# Patient Record
Sex: Female | Born: 1935 | Race: Black or African American | Hispanic: No
Health system: Southern US, Community
[De-identification: ages and names within clinical notes are randomized; demographics above are authoritative.]

## PROBLEM LIST (undated history)

## (undated) DIAGNOSIS — I669 Occlusion and stenosis of unspecified cerebral artery: Secondary | ICD-10-CM | POA: Insufficient documentation

## (undated) DIAGNOSIS — I10 Essential (primary) hypertension: Secondary | ICD-10-CM

## (undated) DIAGNOSIS — I639 Cerebral infarction, unspecified: Secondary | ICD-10-CM

## (undated) DIAGNOSIS — E785 Hyperlipidemia, unspecified: Secondary | ICD-10-CM | POA: Insufficient documentation

## (undated) DIAGNOSIS — N289 Disorder of kidney and ureter, unspecified: Secondary | ICD-10-CM

---

## 2007-11-16 HISTORY — PX: CARDIAC CATHETERIZATION: SHX172

## 2015-05-27 DIAGNOSIS — H1013 Acute atopic conjunctivitis, bilateral: Secondary | ICD-10-CM | POA: Insufficient documentation

## 2020-07-08 ENCOUNTER — Inpatient Hospital Stay (HOSPITAL_COMMUNITY)
Admission: EM | Admit: 2020-07-08 | Discharge: 2020-07-15 | DRG: 291 | Disposition: A | Discharge status: Home Health | Payer: Medicare Other | Attending: Internal Medicine | Admitting: Internal Medicine

## 2020-07-08 ENCOUNTER — Emergency Department (HOSPITAL_COMMUNITY): Payer: Medicare Other

## 2020-07-08 ENCOUNTER — Emergency Department (HOSPITAL_COMMUNITY)
Admission: EM | Admit: 2020-07-08 | Discharge: 2020-07-08 | Disposition: A | Discharge status: Home / Self Care | Payer: Medicare Other | Attending: Emergency Medicine | Admitting: Emergency Medicine

## 2020-07-08 DIAGNOSIS — Z8673 Personal history of transient ischemic attack (TIA), and cerebral infarction without residual deficits: Secondary | ICD-10-CM

## 2020-07-08 DIAGNOSIS — I509 Heart failure, unspecified: Secondary | ICD-10-CM

## 2020-07-08 DIAGNOSIS — I130 Hypertensive heart and chronic kidney disease with heart failure and stage 1 through stage 4 chronic kidney disease, or unspecified chronic kidney disease: Secondary | ICD-10-CM | POA: Diagnosis not present

## 2020-07-08 DIAGNOSIS — R079 Chest pain, unspecified: Secondary | ICD-10-CM | POA: Diagnosis present

## 2020-07-08 DIAGNOSIS — Z79899 Other long term (current) drug therapy: Secondary | ICD-10-CM

## 2020-07-08 DIAGNOSIS — R531 Weakness: Secondary | ICD-10-CM | POA: Diagnosis present

## 2020-07-08 DIAGNOSIS — E876 Hypokalemia: Secondary | ICD-10-CM | POA: Diagnosis present

## 2020-07-08 DIAGNOSIS — Z20822 Contact with and (suspected) exposure to covid-19: Secondary | ICD-10-CM | POA: Diagnosis present

## 2020-07-08 DIAGNOSIS — I252 Old myocardial infarction: Secondary | ICD-10-CM

## 2020-07-08 DIAGNOSIS — I160 Hypertensive urgency: Secondary | ICD-10-CM

## 2020-07-08 DIAGNOSIS — I5033 Acute on chronic diastolic (congestive) heart failure: Secondary | ICD-10-CM | POA: Diagnosis present

## 2020-07-08 DIAGNOSIS — J9601 Acute respiratory failure with hypoxia: Secondary | ICD-10-CM | POA: Diagnosis present

## 2020-07-08 DIAGNOSIS — E785 Hyperlipidemia, unspecified: Secondary | ICD-10-CM | POA: Diagnosis present

## 2020-07-08 DIAGNOSIS — Z5329 Procedure and treatment not carried out because of patient's decision for other reasons: Secondary | ICD-10-CM | POA: Diagnosis not present

## 2020-07-08 DIAGNOSIS — R471 Dysarthria and anarthria: Secondary | ICD-10-CM | POA: Diagnosis not present

## 2020-07-08 DIAGNOSIS — Z6831 Body mass index (BMI) 31.0-31.9, adult: Secondary | ICD-10-CM

## 2020-07-08 DIAGNOSIS — N184 Chronic kidney disease, stage 4 (severe): Secondary | ICD-10-CM | POA: Diagnosis present

## 2020-07-08 DIAGNOSIS — R29704 NIHSS score 4: Secondary | ICD-10-CM | POA: Diagnosis not present

## 2020-07-08 DIAGNOSIS — I161 Hypertensive emergency: Secondary | ICD-10-CM | POA: Diagnosis present

## 2020-07-08 DIAGNOSIS — I248 Other forms of acute ischemic heart disease: Secondary | ICD-10-CM | POA: Diagnosis present

## 2020-07-08 DIAGNOSIS — D72829 Elevated white blood cell count, unspecified: Secondary | ICD-10-CM | POA: Diagnosis present

## 2020-07-08 DIAGNOSIS — I63411 Cerebral infarction due to embolism of right middle cerebral artery: Secondary | ICD-10-CM | POA: Diagnosis not present

## 2020-07-08 DIAGNOSIS — I639 Cerebral infarction, unspecified: Secondary | ICD-10-CM | POA: Diagnosis not present

## 2020-07-08 DIAGNOSIS — Z8249 Family history of ischemic heart disease and other diseases of the circulatory system: Secondary | ICD-10-CM

## 2020-07-08 DIAGNOSIS — Z7902 Long term (current) use of antithrombotics/antiplatelets: Secondary | ICD-10-CM

## 2020-07-08 DIAGNOSIS — E669 Obesity, unspecified: Secondary | ICD-10-CM | POA: Diagnosis present

## 2020-07-08 DIAGNOSIS — I472 Ventricular tachycardia: Secondary | ICD-10-CM | POA: Diagnosis not present

## 2020-07-08 HISTORY — DX: Cerebral infarction, unspecified: I63.9

## 2020-07-08 HISTORY — DX: Essential (primary) hypertension: I10

## 2020-07-08 HISTORY — DX: Disorder of kidney and ureter, unspecified: N28.9

## 2020-07-09 ENCOUNTER — Other Ambulatory Visit: Payer: Self-pay

## 2020-07-09 ENCOUNTER — Emergency Department (HOSPITAL_COMMUNITY): Payer: Medicare Other

## 2020-07-09 ENCOUNTER — Inpatient Hospital Stay (HOSPITAL_COMMUNITY): Payer: Medicare Other

## 2020-07-09 ENCOUNTER — Ambulatory Visit (HOSPITAL_COMMUNITY)
Admission: RE | Admit: 2020-07-09 | Discharge: 2020-07-09 | Disposition: A | Discharge status: Home / Self Care | Payer: Medicare Other | Source: Ambulatory Visit | Attending: Emergency Medicine | Admitting: Emergency Medicine

## 2020-07-09 ENCOUNTER — Encounter (HOSPITAL_COMMUNITY): Payer: Self-pay | Admitting: Internal Medicine

## 2020-07-09 ENCOUNTER — Other Ambulatory Visit (HOSPITAL_COMMUNITY): Payer: Self-pay

## 2020-07-09 DIAGNOSIS — I509 Heart failure, unspecified: Secondary | ICD-10-CM

## 2020-07-09 DIAGNOSIS — E669 Obesity, unspecified: Secondary | ICD-10-CM | POA: Diagnosis present

## 2020-07-09 DIAGNOSIS — I63411 Cerebral infarction due to embolism of right middle cerebral artery: Secondary | ICD-10-CM | POA: Diagnosis not present

## 2020-07-09 DIAGNOSIS — R071 Chest pain on breathing: Secondary | ICD-10-CM | POA: Diagnosis not present

## 2020-07-09 DIAGNOSIS — I161 Hypertensive emergency: Secondary | ICD-10-CM | POA: Diagnosis present

## 2020-07-09 DIAGNOSIS — R079 Chest pain, unspecified: Secondary | ICD-10-CM | POA: Diagnosis not present

## 2020-07-09 DIAGNOSIS — Z5329 Procedure and treatment not carried out because of patient's decision for other reasons: Secondary | ICD-10-CM | POA: Diagnosis not present

## 2020-07-09 DIAGNOSIS — I5033 Acute on chronic diastolic (congestive) heart failure: Secondary | ICD-10-CM | POA: Diagnosis present

## 2020-07-09 DIAGNOSIS — Z20822 Contact with and (suspected) exposure to covid-19: Secondary | ICD-10-CM | POA: Diagnosis present

## 2020-07-09 DIAGNOSIS — D72829 Elevated white blood cell count, unspecified: Secondary | ICD-10-CM | POA: Diagnosis present

## 2020-07-09 DIAGNOSIS — I248 Other forms of acute ischemic heart disease: Secondary | ICD-10-CM | POA: Diagnosis present

## 2020-07-09 DIAGNOSIS — J9601 Acute respiratory failure with hypoxia: Secondary | ICD-10-CM | POA: Diagnosis present

## 2020-07-09 DIAGNOSIS — N184 Chronic kidney disease, stage 4 (severe): Secondary | ICD-10-CM | POA: Diagnosis present

## 2020-07-09 DIAGNOSIS — R471 Dysarthria and anarthria: Secondary | ICD-10-CM | POA: Diagnosis not present

## 2020-07-09 DIAGNOSIS — R531 Weakness: Secondary | ICD-10-CM | POA: Diagnosis present

## 2020-07-09 DIAGNOSIS — I130 Hypertensive heart and chronic kidney disease with heart failure and stage 1 through stage 4 chronic kidney disease, or unspecified chronic kidney disease: Secondary | ICD-10-CM | POA: Diagnosis present

## 2020-07-09 DIAGNOSIS — Z8249 Family history of ischemic heart disease and other diseases of the circulatory system: Secondary | ICD-10-CM | POA: Diagnosis not present

## 2020-07-09 DIAGNOSIS — I252 Old myocardial infarction: Secondary | ICD-10-CM | POA: Diagnosis not present

## 2020-07-09 DIAGNOSIS — E876 Hypokalemia: Secondary | ICD-10-CM | POA: Diagnosis present

## 2020-07-09 DIAGNOSIS — Z6831 Body mass index (BMI) 31.0-31.9, adult: Secondary | ICD-10-CM | POA: Diagnosis not present

## 2020-07-09 DIAGNOSIS — Z8673 Personal history of transient ischemic attack (TIA), and cerebral infarction without residual deficits: Secondary | ICD-10-CM | POA: Diagnosis not present

## 2020-07-09 DIAGNOSIS — R072 Precordial pain: Secondary | ICD-10-CM | POA: Diagnosis not present

## 2020-07-09 DIAGNOSIS — I5021 Acute systolic (congestive) heart failure: Secondary | ICD-10-CM

## 2020-07-09 DIAGNOSIS — Z7902 Long term (current) use of antithrombotics/antiplatelets: Secondary | ICD-10-CM | POA: Diagnosis not present

## 2020-07-09 DIAGNOSIS — Z79899 Other long term (current) drug therapy: Secondary | ICD-10-CM | POA: Diagnosis not present

## 2020-07-09 DIAGNOSIS — E785 Hyperlipidemia, unspecified: Secondary | ICD-10-CM | POA: Diagnosis present

## 2020-07-09 DIAGNOSIS — R29704 NIHSS score 4: Secondary | ICD-10-CM | POA: Diagnosis not present

## 2020-07-09 DIAGNOSIS — I472 Ventricular tachycardia: Secondary | ICD-10-CM | POA: Diagnosis not present

## 2020-07-09 DIAGNOSIS — I639 Cerebral infarction, unspecified: Secondary | ICD-10-CM | POA: Diagnosis not present

## 2020-07-09 LAB — COMPREHENSIVE METABOLIC PANEL
ALT: 27 U/L (ref 0–44)
AST: 27 U/L (ref 15–41)
Albumin: 3.2 g/dL — ABNORMAL LOW (ref 3.5–5.0)
Alkaline Phosphatase: 170 U/L — ABNORMAL HIGH (ref 38–126)
Anion gap: 9 (ref 5–15)
BUN: 30 mg/dL — ABNORMAL HIGH (ref 8–23)
CO2: 24 mmol/L (ref 22–32)
Calcium: 9 mg/dL (ref 8.9–10.3)
Chloride: 105 mmol/L (ref 98–111)
Creatinine, Ser: 1.95 mg/dL — ABNORMAL HIGH (ref 0.44–1.00)
GFR calc Af Amer: 27 mL/min — ABNORMAL LOW (ref >60)
GFR calc non Af Amer: 23 mL/min — ABNORMAL LOW (ref >60)
Glucose, Bld: 145 mg/dL — ABNORMAL HIGH (ref 70–99)
Potassium: 3.4 mmol/L — ABNORMAL LOW (ref 3.5–5.1)
Sodium: 138 mmol/L (ref 135–145)
Total Bilirubin: 0.7 mg/dL (ref 0.3–1.2)
Total Protein: 7.4 g/dL (ref 6.5–8.1)

## 2020-07-09 LAB — CBC WITH DIFFERENTIAL/PLATELET
Abs Immature Granulocytes: 0.06 10*3/uL (ref 0.00–0.07)
Basophils Absolute: 0 10*3/uL (ref 0.0–0.1)
Basophils Relative: 0 %
Eosinophils Absolute: 0.1 10*3/uL (ref 0.0–0.5)
Eosinophils Relative: 1 %
HCT: 33.2 % — ABNORMAL LOW (ref 36.0–46.0)
Hemoglobin: 10 g/dL — ABNORMAL LOW (ref 12.0–15.0)
Immature Granulocytes: 1 %
Lymphocytes Relative: 16 %
Lymphs Abs: 1.9 10*3/uL (ref 0.7–4.0)
MCH: 26.7 pg (ref 26.0–34.0)
MCHC: 30.1 g/dL (ref 30.0–36.0)
MCV: 88.5 fL (ref 80.0–100.0)
Monocytes Absolute: 0.8 10*3/uL (ref 0.1–1.0)
Monocytes Relative: 7 %
Neutro Abs: 9 10*3/uL — ABNORMAL HIGH (ref 1.7–7.7)
Neutrophils Relative %: 75 %
Platelets: 244 10*3/uL (ref 150–400)
RBC: 3.75 MIL/uL — ABNORMAL LOW (ref 3.87–5.11)
RDW: 14.9 % (ref 11.5–15.5)
WBC: 12 10*3/uL — ABNORMAL HIGH (ref 4.0–10.5)
nRBC: 0 % (ref 0.0–0.2)

## 2020-07-09 LAB — ECHOCARDIOGRAM COMPLETE
Area-P 1/2: 4.96 cm2
Calc EF: 48.8 %
Height: 67 in
MV M vel: 5.58 m/s
MV Peak grad: 124.5 mmHg
S' Lateral: 3.5 cm
Single Plane A2C EF: 46.9 %
Single Plane A4C EF: 52.2 %
Weight: 3200 oz

## 2020-07-09 LAB — URINALYSIS, ROUTINE W REFLEX MICROSCOPIC
Bilirubin Urine: NEGATIVE
Glucose, UA: NEGATIVE mg/dL
Hgb urine dipstick: NEGATIVE
Ketones, ur: NEGATIVE mg/dL
Leukocytes,Ua: NEGATIVE
Nitrite: NEGATIVE
Protein, ur: 30 mg/dL — AB
Specific Gravity, Urine: 1.004 — ABNORMAL LOW (ref 1.005–1.030)
pH: 6 (ref 5.0–8.0)

## 2020-07-09 LAB — TROPONIN I (HIGH SENSITIVITY)
Troponin I (High Sensitivity): 72 ng/L — ABNORMAL HIGH (ref <18)
Troponin I (High Sensitivity): 93 ng/L — ABNORMAL HIGH (ref <18)
Troponin I (High Sensitivity): 115 ng/L (ref <18)

## 2020-07-09 LAB — MAGNESIUM: Magnesium: 2.1 mg/dL (ref 1.7–2.4)

## 2020-07-09 LAB — SARS CORONAVIRUS 2 BY RT PCR (HOSPITAL ORDER, PERFORMED IN ~~LOC~~ HOSPITAL LAB): SARS Coronavirus 2: NEGATIVE

## 2020-07-09 LAB — BRAIN NATRIURETIC PEPTIDE: B Natriuretic Peptide: 285.1 pg/mL — ABNORMAL HIGH (ref 0.0–100.0)

## 2020-07-09 LAB — HEPARIN LEVEL (UNFRACTIONATED): Heparin Unfractionated: 0.25 IU/mL — ABNORMAL LOW (ref 0.30–0.70)

## 2020-07-09 MED ORDER — SODIUM CHLORIDE 0.9% FLUSH: 3.0000 mL | INTRAVENOUS | Status: DC | PRN

## 2020-07-09 MED ORDER — NITROGLYCERIN IN D5W 200-5 MCG/ML-% IV SOLN
0.0000 ug/min | INTRAVENOUS | Status: DC
  Administered 2020-07-09: 5 ug/min via INTRAVENOUS
  Filled 2020-07-09: qty 250

## 2020-07-09 MED ORDER — PERFLUTREN LIPID MICROSPHERE
1.0000 mL | INTRAVENOUS | Status: AC | PRN
  Administered 2020-07-09: 2 mL via INTRAVENOUS
  Filled 2020-07-09: qty 10

## 2020-07-09 MED ORDER — SODIUM CHLORIDE 0.9% FLUSH
3.0000 mL | Freq: Two times a day (BID) | INTRAVENOUS | Status: DC
  Administered 2020-07-09 – 2020-07-15 (×11): 3 mL via INTRAVENOUS

## 2020-07-09 MED ORDER — ACETAMINOPHEN 325 MG PO TABS
650.0000 mg | ORAL_TABLET | ORAL | Status: DC | PRN
  Administered 2020-07-12 – 2020-07-14 (×3): 650 mg via ORAL
  Filled 2020-07-09 (×3): qty 2

## 2020-07-09 MED ORDER — NITROGLYCERIN 2 % TD OINT
1.0000 [in_us] | TOPICAL_OINTMENT | Freq: Once | TRANSDERMAL | Status: AC
  Administered 2020-07-09: 1 [in_us] via TOPICAL
  Filled 2020-07-09: qty 1

## 2020-07-09 MED ORDER — FUROSEMIDE 10 MG/ML IJ SOLN
20.0000 mg | Freq: Two times a day (BID) | INTRAMUSCULAR | Status: DC
  Administered 2020-07-09 – 2020-07-11 (×5): 20 mg via INTRAVENOUS
  Filled 2020-07-09 (×5): qty 2

## 2020-07-09 MED ORDER — SODIUM CHLORIDE 0.9 % IV SOLN: 250.0000 mL | INTRAVENOUS | Status: DC | PRN

## 2020-07-09 MED ORDER — IPRATROPIUM-ALBUTEROL 0.5-2.5 (3) MG/3ML IN SOLN: 3.0000 mL | Freq: Four times a day (QID) | RESPIRATORY_TRACT | Status: DC | PRN

## 2020-07-09 MED ORDER — HEPARIN BOLUS VIA INFUSION
4000.0000 [IU] | Freq: Once | INTRAVENOUS | Status: AC
  Administered 2020-07-09: 4000 [IU] via INTRAVENOUS
  Filled 2020-07-09: qty 4000

## 2020-07-09 MED ORDER — HEPARIN BOLUS VIA INFUSION
1100.0000 [IU] | Freq: Once | INTRAVENOUS | Status: AC
  Administered 2020-07-09: 1100 [IU] via INTRAVENOUS
  Filled 2020-07-09: qty 1100

## 2020-07-09 MED ORDER — ROSUVASTATIN CALCIUM 20 MG PO TABS
20.0000 mg | ORAL_TABLET | Freq: Every day | ORAL | Status: DC
  Administered 2020-07-09 – 2020-07-14 (×6): 20 mg via ORAL
  Filled 2020-07-09 (×6): qty 1

## 2020-07-09 MED ORDER — POTASSIUM CHLORIDE CRYS ER 20 MEQ PO TBCR
30.0000 meq | EXTENDED_RELEASE_TABLET | Freq: Once | ORAL | Status: AC
  Administered 2020-07-09: 30 meq via ORAL
  Filled 2020-07-09: qty 2

## 2020-07-09 MED ORDER — HEPARIN (PORCINE) 25000 UT/250ML-% IV SOLN
1200.0000 [IU]/h | INTRAVENOUS | Status: DC
  Administered 2020-07-09: 900 [IU]/h via INTRAVENOUS
  Administered 2020-07-11: 1050 [IU]/h via INTRAVENOUS
  Filled 2020-07-09 (×3): qty 250

## 2020-07-09 MED ORDER — FUROSEMIDE 10 MG/ML IJ SOLN
20.0000 mg | Freq: Once | INTRAMUSCULAR | Status: AC
  Administered 2020-07-09: 20 mg via INTRAVENOUS
  Filled 2020-07-09: qty 2

## 2020-07-09 MED ORDER — ASPIRIN 81 MG PO CHEW
324.0000 mg | CHEWABLE_TABLET | Freq: Once | ORAL | Status: AC
  Administered 2020-07-09: 324 mg via ORAL
  Filled 2020-07-09: qty 4

## 2020-07-09 MED ORDER — IPRATROPIUM-ALBUTEROL 0.5-2.5 (3) MG/3ML IN SOLN
RESPIRATORY_TRACT | Status: AC
  Administered 2020-07-09: 3 mL
  Filled 2020-07-09: qty 3

## 2020-07-09 MED ORDER — CLOPIDOGREL BISULFATE 75 MG PO TABS
75.0000 mg | ORAL_TABLET | Freq: Every day | ORAL | Status: DC
  Administered 2020-07-09 – 2020-07-15 (×7): 75 mg via ORAL
  Filled 2020-07-09 (×7): qty 1

## 2020-07-09 NOTE — Progress Notes (Signed)
Same day note  Patient seen and examined at bedside.  Patient was admitted to the hospital for shortness of breath and elevated blood pressure  At the time of my evaluation, patient complains of dyspnea, shortness of breath  Physical examination reveals mild crackles of the bases with wheezing.  Lower extremity edema ++  Laboratory data and imaging was reviewed  Assessment and Plan.  Acute hypoxemic respiratory failure likely secondary to acute diastolic CHF exacerbation. No documented history of CHF and no prior echo patient historically was on Lasix in the past.   Presenting with complaints of dyspnea, orthopnea, and bilateral lower extremity edema. BNP elevated at 285. Chest x-ray showing cardiomegaly with mild vascular congestion and mild edema.    Received supplemental oxygen initially.  On IV Lasix 20 twice daily.   2D echocardiogram with LV ejection fraction of 55 to 82% with diastolic dysfunction..  Strict intake and output charting Daily weights fluid restriction.  Nebs for some wheezes.  COVID-19 was negative.  Chest pain:  With mild elevation of troponins.  No documented history of CAD but was admitted in [facility] for possible MI in the past.  EKG with T wave inversions in inferior and lateral leads.  Could be demand ischemia.  Patient did have elevated blood pressure on arrival.  On aspirin and IV heparin.  2D echocardiogram performed 07/06/2020 showed LV ejection fraction of 55 to 60%.  No regional wall motion abnormalities.  Diastolic filling impairment noted.  Cardiology was notified by the ED provider.    Hypertensive emergency: Blood pressure significantly elevated with systolic up to 800L in the ED and started on nitroglycerin drip due to hypertensive emergency and elevated troponin.  Blood pressure has improved at this time.  Mild leukocytosis: Likely reactive.  Patient is afebrile.  Chest x-ray not negative for pneumonia.  Urinalysis negative for  infection.  Mild hypokalemia: Potassium 3.4.  Magnesium normal. Continue to replenish.  Check levels in a.m.  Hyperlipidemia Continue Crestor  CKD stage IV:   Creatinine 1.9, baseline 1.7-1.9. Need to monitor renal function on IV diuretics.  History of CVA -Continue Plavix   No Charge  Signed,  Delila Pereyra, MD [facility]

## 2020-07-09 NOTE — ED Provider Notes (Signed)
[facility] EMERGENCY DEPARTMENT Provider Note   CSN: [id number] Arrival date & time: 07/08/20  2340     History Chief Complaint  Patient presents with  . Shortness of Breath    Jillian [name] is a 84 y.o. female with a history of HTN, HLD, CVA who presents to the emergency department by EMS with a chief complaint of shortness of breath.  The patient reports shortness of breath that has been gradually worsening since onset a week ago.  However, she began feeling significantly more short of breath over the last 24 hours.  She reports associated fatigue, PND, and orthopnea.  Last night, she barely got any sleep as she was unable to lay flat and had to sit up in her recliner all night.  She also endorses leg swelling and abdominal fullness.  Reports that she has had to roll down the band on her pants over the last week as they have been too tight.  She does not weigh herself at home daily.  She states that she feels as if when she moves from side to side that she can feel fluid "sloshing around" in her lungs and abdomen, which she began noticing over the last few days.  Tonight, the patient was walking to the restroom when her right leg suddenly felt "heavy" causing her to trip over her foot and stub her right toe.  She denies falling to the ground.  She did not hit her head and had no LOC.  Heaviness in her right leg has since resolved.  The patient notes that when she was diagnosed with her previous stroke in 2016 after she presented with "heaviness" in her extremities.   The patient reports that she then had 2 bowel movements and was so dyspneic that she was having difficulty standing to get off the toilet so she called her daughter.  She was then prompted to call EMS when shortly thereafter she suddenly developed developed diaphoresis, wheezing, and respiratory distress.  No chest pain, abdominal pain, back pain, numbness, syncope, dizziness, lightheadedness, visual changes,  neck pain, extremity pain, nausea, vomiting.  EMS reports that on arrival the lungs were significantly diminished with rales.  She was given 0.4 of nitroglycerin and a nebulizer and started on CPAP.  Repeat pulmonary evaluation with significant improvement.  Patient reports compliance with all of her home medications prior to arrival.  She reports a history of an MI in 2009, but reports that she is not followed by cardiology and is not sure if she is seen a cardiologist.  She denies taking a diuretic.  Thinks that she may have taken it several years ago, but cannot recall the name and believes that the medication had to be discontinued due to her blood pressure being too low.  PCP is Dr. Jeannene Patella in [place], [place], with family medicine practice. She does not have any other establish care team members.  She is on Plavix.  Per Care Everywhere:  "Past Medical History:  Diagnosis Date  . Benign essential hypertension  . Cerebral artery occlusion  . Diabetes type 2, controlled ([place])  . Hyperlipidemia  . OA (osteoarthritis)   Past Surgical History:  Procedure Laterality Date  . HX CATARACT REMOVAL Bilateral 11/2013  . HX COLONOSCOPY 10/11/05  polyps  . HX HYSTERECTOMY  . HX THYROIDECTOMY 1982"   "Risk factors for coronary artery disease include dyslipidemia and obesity. Hypertensive end-organ damage includes CVA. There is no history of angina, kidney disease, CAD/MI, heart failure  or PVD. There is no history of chronic renal disease, renovascular disease or a thyroid problem."    The history is provided by the patient. No language interpreter was used.       Past Medical History:  Diagnosis Date  . Hypertension   . Renal disorder    CKD Stage IV  . Stroke [facility])     Patient Active Problem List   Diagnosis Date Noted  . CHF exacerbation ([place]) 07/09/2020  . Acute hypoxemic respiratory failure ([place]) 07/09/2020  . Chest pain 07/09/2020  . Hypertensive emergency 07/09/2020  .  CKD (chronic kidney disease) stage 4, GFR 15-29 ml/min ([facility]) 07/09/2020    History reviewed. No pertinent surgical history.   OB History   No obstetric history on file.     History reviewed. No pertinent family history.  Social History   Tobacco Use  . Smoking status: Not on file  Substance Use Topics  . Alcohol use: Not on file  . Drug use: Not on file    Home Medications Prior to Admission medications   Medication Sig Start Date End Date Taking? Authorizing Provider  amLODipine (NORVASC) 5 MG tablet Take 5 mg by mouth daily.   Yes [provider]  clopidogrel (PLAVIX) 75 MG tablet Take 75 mg by mouth daily.   Yes [provider]  hydrALAZINE (APRESOLINE) 50 MG tablet Take 50 mg by mouth 3 (three) times daily.   Yes [provider]  losartan (COZAAR) 100 MG tablet Take 100 mg by mouth daily.   Yes [provider]  rosuvastatin (CRESTOR) 20 MG tablet Take 20 mg by mouth daily.   Yes [provider]    Allergies    Penicillins  Review of Systems   Review of Systems  Constitutional: Positive for diaphoresis and fatigue. Negative for activity change, chills and fever.  HENT: Negative for congestion and sore throat.   Eyes: Negative for visual disturbance.  Respiratory: Positive for cough and shortness of breath.   Cardiovascular: Positive for leg swelling. Negative for chest pain.  Gastrointestinal: Positive for abdominal distention. Negative for abdominal pain, diarrhea, nausea and vomiting.  Genitourinary: Negative for difficulty urinating, dysuria and urgency.  Musculoskeletal: Negative for arthralgias, back pain, myalgias, neck pain and neck stiffness.  Skin: Negative for rash.  Allergic/Immunologic: Negative for immunocompromised state.  Neurological: Negative for dizziness, syncope, weakness, light-headedness, numbness and headaches.  Psychiatric/Behavioral: Negative for confusion.    Physical Exam Updated Vital  Signs BP (!) 167/72   Pulse 84   Temp 98.7 F (37.1 C) (Oral)   Resp (!) 30   Ht 5\' 7"  (1.702 m)   Wt 90.7 kg   SpO2 100%   BMI 31.32 kg/m   Physical Exam Vitals and nursing note reviewed.  Constitutional:      General: She is not in acute distress.    Appearance: She is not ill-appearing, toxic-appearing or diaphoretic.  HENT:     Head: Normocephalic.  Eyes:     Conjunctiva/sclera: Conjunctivae normal.  Neck:     Comments: No JVD Cardiovascular:     Rate and Rhythm: Normal rate and regular rhythm.     Heart sounds: No murmur heard.  No friction rub. No gallop.   Pulmonary:     Effort: Pulmonary effort is normal. No respiratory distress.     Comments: Rales throughout.  Patient is able to speak in complete, fluent sentences on room air without increased work of breathing. Abdominal:     General:  There is no distension.     Palpations: Abdomen is soft. There is no mass.     Tenderness: There is no abdominal tenderness. There is no right CVA tenderness, left CVA tenderness, guarding or rebound.     Hernia: No hernia is present.  Musculoskeletal:     Cervical back: Neck supple.     Comments: 1+ pitting edema to the bilateral lower extremities.  Skin:    General: Skin is warm.     Findings: No rash.  Neurological:     Mental Status: She is alert.     Comments: GCS 15.  Alert and oriented x4.  Moves all 4 extremities spontaneously.  Sensation is intact and equal throughout.  Cranial nerves II through XII are grossly intact.  Finger-nose is intact bilaterally.  Psychiatric:        Behavior: Behavior normal.     ED Results / Procedures / Treatments   Labs (all labs ordered are listed, but only abnormal results are displayed) Labs Reviewed  COMPREHENSIVE METABOLIC PANEL - Abnormal; Notable for the following components:      Result Value   Potassium 3.4 (*)    Glucose, Bld 145 (*)    BUN 30 (*)    Creatinine, Ser 1.95 (*)    Albumin 3.2 (*)    Alkaline Phosphatase  170 (*)    GFR calc non Af Amer 23 (*)    GFR calc Af Amer 27 (*)    All other components within normal limits  CBC WITH DIFFERENTIAL/PLATELET - Abnormal; Notable for the following components:   WBC 12.0 (*)    RBC 3.75 (*)    Hemoglobin 10.0 (*)    HCT 33.2 (*)    Neutro Abs 9.0 (*)    All other components within normal limits  BRAIN NATRIURETIC PEPTIDE - Abnormal; Notable for the following components:   B Natriuretic Peptide 285.1 (*)    All other components within normal limits  URINALYSIS, ROUTINE W REFLEX MICROSCOPIC - Abnormal; Notable for the following components:   Color, Urine COLORLESS (*)    Specific Gravity, Urine 1.004 (*)    Protein, ur 30 (*)    Bacteria, UA RARE (*)    All other components within normal limits  TROPONIN I (HIGH SENSITIVITY) - Abnormal; Notable for the following components:   Troponin I (High Sensitivity) 72 (*)    All other components within normal limits  TROPONIN I (HIGH SENSITIVITY) - Abnormal; Notable for the following components:   Troponin I (High Sensitivity) 93 (*)    All other components within normal limits  SARS CORONAVIRUS 2 BY RT PCR (HOSPITAL ORDER, [facility])  MAGNESIUM  HEPARIN LEVEL (UNFRACTIONATED)  TROPONIN I (HIGH SENSITIVITY)    EKG EKG Interpretation  Date/Time: 07/08/2020; 41:28:78    Ventricular Rate: 99   PR Interval: 132   QRS Duration: 105   QT Interval: 381   QTC Calculation: 489   R Axis:  56    Text Interpretation:  Sinus rhythm. Probable LVH with secondary early repol; borderline prolonged QT interval     Radiology CT Head Wo Contrast  Result Date: 07/09/2020 CLINICAL DATA:  84 year old female with transient ischemic attack. EXAM: CT HEAD WITHOUT CONTRAST TECHNIQUE: Contiguous axial images were obtained from the base of the skull through the vertex without intravenous contrast. COMPARISON:  None. FINDINGS: Brain: There is moderate age-related atrophy and chronic microvascular  ischemic changes. Probable focal area of old infarct involving the right  cerebellar hemisphere. Additional area of old infarct involving the right periventricular white matter and left frontal cortex. There is no acute intracranial hemorrhage. No mass effect or midline shift. Bilateral low attenuating subdural fluid over the convexities may be related to volume loss or represent old hygroma. Vascular: No hyperdense vessel or unexpected calcification. Skull: Normal. Negative for fracture or focal lesion. Sinuses/Orbits: There is complete opacification of the left maxillary sinus. The remainder of the visualized paranasal sinuses and mastoid air cells are clear. Other: None IMPRESSION: 1. No acute intracranial hemorrhage. 2. Moderate age-related atrophy and chronic microvascular ischemic changes. Areas of old infarct. Electronically Signed   By: Anner Crete M.D.   On: 07/09/2020 01:55    Procedures .Critical Care Performed by: Joanne Gavel, PA-C Authorized by: Joanne Gavel, PA-C   Critical care provider statement:    Critical care time (minutes):  55   Critical care time was exclusive of:  Separately billable procedures and treating other patients and teaching time   Critical care was necessary to treat or prevent imminent or life-threatening deterioration of the following conditions:  Respiratory failure and cardiac failure   Critical care was time spent personally by me on the following activities:  Ordering and performing treatments and interventions, ordering and review of laboratory studies, ordering and review of radiographic studies, pulse oximetry, re-evaluation of patient's condition, review of old charts, obtaining history from patient or surrogate, examination of patient, development of treatment plan with patient or surrogate, evaluation of patient's response to treatment and discussions with consultants   I assumed direction of critical care for this patient from another provider  in my specialty: no     (including critical care time)  Medications Ordered in ED Medications  nitroGLYCERIN 50 mg in dextrose 5 % 250 mL (0.2 mg/mL) infusion (15 mcg/min Intravenous Rate/Dose Change 07/09/20 0506)  heparin ADULT infusion 100 units/mL (25000 units/262mL sodium chloride 0.45%) (900 Units/hr Intravenous New Bag/Given 07/09/20 0640)  rosuvastatin (CRESTOR) tablet 20 mg (has no administration in time range)  clopidogrel (PLAVIX) tablet 75 mg (has no administration in time range)  sodium chloride flush (NS) 0.9 % injection 3 mL (has no administration in time range)  sodium chloride flush (NS) 0.9 % injection 3 mL (has no administration in time range)  0.9 %  sodium chloride infusion (has no administration in time range)  acetaminophen (TYLENOL) tablet 650 mg (has no administration in time range)  furosemide (LASIX) injection 20 mg (has no administration in time range)  ipratropium-albuterol (DUONEB) 0.5-2.5 (3) MG/3ML nebulizer solution 3 mL (has no administration in time range)  nitroGLYCERIN (NITROGLYN) 2 % ointment 1 inch (1 inch Topical Given 07/09/20 0259)  furosemide (LASIX) injection 20 mg (20 mg Intravenous Given 07/09/20 0301)  ipratropium-albuterol (DUONEB) 0.5-2.5 (3) MG/3ML nebulizer solution (3 mLs  Given 07/09/20 0321)  aspirin chewable tablet 324 mg (324 mg Oral Given 07/09/20 0641)  heparin bolus via infusion 4,000 Units (4,000 Units Intravenous Bolus from Bag 07/09/20 0641)  potassium chloride SA (KLOR-CON) CR tablet 30 mEq (30 mEq Oral Given 07/09/20 9562)    ED Course  I have reviewed the triage vital signs and the nursing notes.  Pertinent labs & imaging results that were available during my care of the patient were reviewed by me and considered in my medical decision making (see chart for details).  Clinical Course as of Jul 09 825  Wed Jul 09, 2020  0321 Patient recheck.  While in the room updating the patient  and her granddaughter, the patient became acutely  tachypneic with suprasternal retractions.  Diffuse rales noted on repeat exam.  She desaturated to 89% on room air for approximately 1 minute before improving into the low 90s.  Patient recently given nitroglycerin paste and Lasix.  Will give albuterol nebulizer and if no improvement, she may require BiPAP.  Patient reports that previous MI was in 2009 and she had a CVA in 2016.   [MM]    Clinical Course User Index [MM] Yashar Inclan, Laymond Purser, PA-C   MDM Rules/Calculators/A&P                          84 year old female with a history of hypertension, hyperlipidemia, and is CVA presenting from home by EMS with shortness of breath that began a week ago that acutely worsened over the last 24 hours.  Shortness of breath has been accompanied by lower extremity edema, PND, orthopnea, and abdominal distention. The patient was seen and independently evaluated in the ER by Dr. Christy Gentles, attending physician.   Hypertensive to 176/80 on arrival.  Patient was able to be weaned from CPAP to room air.  No initial tachypnea or hypoxia.  She is afebrile.  She was given 4 of nitroglycerin prior to arrival in the ER and albuterol nebulizer.  Initially, patient was registered with an incorrect birthday, which has since been corrected.  Unfortunately, her medical records and care everywhere were not available for several hours due to this delay and patient is a somewhat poor historian.  She initially reported that she had an MI in 2009, but is and has not been evaluated by cardiology.  After reviewing her medical records in care everywhere, her family physician denies a history of CAD?  I also do not see any imaging of previous cardiovascular work-up.  Notably, CVA in 2016 was secondary to hypertensive endorgan disease.  Previous labs in July 2021 with elevated TSH in the setting of normal free T3 and free T4, but patient has no history of thyroid disease.  IMAGING: Chest x-ray has been evaluated by me and is cardiomegaly is  noted as well as vascular congestion.  Given patient's report of right leg heaviness earlier tonight, which she reports is similar to how she presented for previous CVA, CT head was obtained.  Old infarct was noted, but no acute findings today.  Patient has no neurologic deficits on exam.  LABS:  - Creatinine is 1.95.  Patient denies a history of kidney disease.  However, after several hours when care everywhere was available, this appears to be unchanged from labs performed in July and patient has a history of stage IV CKD.   - Initial troponin is 72-> repeat 93.  This could be secondary to demand.  Patient is also increasingly hypertensive, and could be secondary to endorgan disease related to hypertension.  - BNP is elevated at 285, but this could be falsely low given patient's history of obesity. - She does have a mild leukocytosis, but has no other infectious symptoms. - Hemoglobin is 10.  This is unchanged from previous based on care everywhere.  Doubt shortness of breath is secondary to anemia.  She is having no signs or symptoms of active bleeding.  ED COURSE: Patient's blood pressure did improve to 170s/80s, but she became acutely tachypneic and hypertensive to 210/90s with suprasternal retractions and hypoxia to 89% and required repeat DuoNeb.  Hypoxia, tachypnea, and suprasternal retractions resolved following DuoNeb.  She was  given Nitropaste after discussing the patient with Dr. Christy Gentles with no improvement in her blood pressure.  She does not need BiPAP at this time, but given continued significantly elevated blood pressure, we will start the patient on nitroglycerin infusion.  Patient adamantly denies chest pain.  We will continue to trend.  Given the patient is acutely ill and continuing to require nitroglycerin infusion, she will require admission.  Differential diagnosis includes acute CHF exacerbation, heart failure secondary to hypertensive emergency, and NSTEMI. Less likely  COVID-19, myxedema coma, or COPD exacerbation.   CONSULT: Spoke with Dr. Kalman Shan with cardiology.  Cardiology will follow the patient.  CONSULT: Spoke with Dr. Marlowe Sax, hospitalist, who will accept the patient for admission.  The patient appears reasonably stabilized for admission considering the current resources, flow, and capabilities available in the ED at this time, and I doubt any other [facility] requiring further screening and/or treatment in the ED prior to admission.   Final Clinical Impression(s) / ED Diagnoses Final diagnoses:  Congestive heart failure, unspecified HF chronicity, unspecified heart failure type [facility])  Hypertensive urgency    Rx / DC Orders ED Discharge Orders    None       Joanne Gavel, PA-C 07/09/20 2035    Ripley Fraise, MD 07/10/20 [phone]

## 2020-07-09 NOTE — H&P (Addendum)
.  History and Physical    Jillian [name] DQQ:[id number] DOB: 01/23/1936 DOA: 07/08/2020  PCP: Patient, No Pcp Per Patient coming from: Home  Chief Complaint: Shortness of breath  HPI: Jillian [name] is a 84 y.o. female with medical history significant of hypertension, hyperlipidemia, obesity (BMI 31.32), CKD stage IV, CVA presenting to the ED with complaints of shortness of breath.  EMS reported diminished breath sounds upon auscultation of the lungs with rales.  She was given 0.4 of nitroglycerin, nebulizer treatment, and started on CPAP.  Improved with these treatments.  Patient states since Sunday she is having a lot of shortness of breath.  She is not able to lay down flat as it makes her shortness of breath worse and gives her heaviness in her chest.  Her legs have been swollen.  She has been vaccinated against Covid.  Denies fevers or cough.  States she has congestive heart failure and was taking Lasix several years ago.  Patient also reports history of MI in 2009 at a hospital in [place], [place].  Unclear if she is currently being seen by a cardiologist.  States her legs have felt heavy because they are swollen and she fell toward the door today because she could not breathe.  She did not hit the ground.  Denies hitting her head or any injuries from the fall.  Denies any focal weakness or numbness.  Denies history of asthma or COPD.  States she tried cigarettes when she was a teenager but denies longstanding history of cigarette smoking.  ED Course: Afebrile.  WBC count 12.0.  Hemoglobin 10.0, at baseline per review of prior records under Care Everywhere.  Platelet count normal.  Sodium 138, potassium 3.4, chloride 105, bicarb 24, BUN 30, creatinine 1.9, and glucose 145.  Baseline creatinine 1.7-1.9.  Alkaline phosphatase chronically elevated, remainder of LFTs normal.  Magnesium normal.  BNP 285.  High-sensitivity troponin 72 >93.  EKG showing T wave inversions in inferior and lateral  leads, no prior tracing for comparison.  SARS-CoV-2 PCR test negative.  Chest x-ray showing cardiomegaly with mild vascular congestion and mild edema.  Head CT negative for acute intracranial abnormality.  Initially satting well on room air but later desatted to 89% and became tachypneic with respiratory rate in the 30s.  Blood pressure elevated with systolic up to 194R.  Patient was given IV Lasix 20 mg, DuoNeb treatment, and started on nitroglycerin infusion.  Cardiology consulted by ED provider.   Review of Systems:  All systems reviewed and apart from history of presenting illness, are negative.  Previous records under Care Everywhere.  Past medical history: Mentioned in HPI.  Past surgical history: Cataract removal, colonoscopy, hysterectomy, thyroidectomy  Social history: No tobacco, ethanol, or drug use.  Family history: Father with CAD.  Allergies  Allergen Reactions  . Penicillins Nausea Only    Prior to Admission medications   Medication Sig Start Date End Date Taking? Authorizing Provider  amLODipine (NORVASC) 5 MG tablet Take 5 mg by mouth daily.   Yes [provider]  clopidogrel (PLAVIX) 75 MG tablet Take 75 mg by mouth daily.   Yes [provider]  hydrALAZINE (APRESOLINE) 50 MG tablet Take 50 mg by mouth 3 (three) times daily.   Yes [provider]  losartan (COZAAR) 100 MG tablet Take 100 mg by mouth daily.   Yes [provider]  rosuvastatin (CRESTOR) 20 MG tablet Take 20 mg by mouth daily.   Yes [provider]  Physical Exam: Vitals:   07/09/20 0445 07/09/20 0515 07/09/20 0530 07/09/20 0615  BP: (!) 183/82 (!) 163/65 (!) 168/72 (!) 167/72  Pulse: 95 90 89 84  Resp: (!) 30 (!) 22 (!) 25 (!) 30  Temp:      TempSrc:      SpO2: 99% 99% 99% 100%  Weight:      Height:        Physical Exam Constitutional:      General: She is not in acute distress. HENT:     Head: Normocephalic and atraumatic.  Eyes:      Extraocular Movements: Extraocular movements intact.     Conjunctiva/sclera: Conjunctivae normal.  Cardiovascular:     Rate and Rhythm: Normal rate and regular rhythm.     Pulses: Normal pulses.  Pulmonary:     Breath sounds: Rales present.     Comments: Slightly tachypneic Mild scattered wheezing Abdominal:     General: Bowel sounds are normal.     Palpations: Abdomen is soft.     Tenderness: There is no abdominal tenderness. There is no guarding.  Musculoskeletal:        General: Swelling present.     Cervical back: Normal range of motion and neck supple.     Comments: +2 pedal edema bilaterally  Skin:    General: Skin is warm and dry.  Neurological:     General: No focal deficit present.     Mental Status: She is alert and oriented to person, place, and time.     Labs on Admission: I have personally reviewed following labs and imaging studies  CBC: Recent Labs  Lab 07/09/20 0018  WBC 12.0*  NEUTROABS 9.0*  HGB 10.0*  HCT 33.2*  MCV 88.5  PLT 798   Basic Metabolic Panel: Recent Labs  Lab 07/09/20 0018 07/09/20 0046  NA 138  --   K 3.4*  --   CL 105  --   CO2 24  --   GLUCOSE 145*  --   BUN 30*  --   CREATININE 1.95*  --   CALCIUM 9.0  --   MG  --  2.1   GFR: Estimated Creatinine Clearance: 25.7 mL/min (A) (by C-G formula based on SCr of 1.95 mg/dL (H)). Liver Function Tests: Recent Labs  Lab 07/09/20 0018  AST 27  ALT 27  ALKPHOS 170*  BILITOT 0.7  PROT 7.4  ALBUMIN 3.2*   No results for input(s): LIPASE, AMYLASE in the last 168 hours. No results for input(s): AMMONIA in the last 168 hours. Coagulation Profile: No results for input(s): INR, PROTIME in the last 168 hours. Cardiac Enzymes: No results for input(s): CKTOTAL, CKMB, CKMBINDEX, TROPONINI in the last 168 hours. BNP (last 3 results) No results for input(s): PROBNP in the last 8760 hours. HbA1C: No results for input(s): HGBA1C in the last 72 hours. CBG: No results for input(s):  GLUCAP in the last 168 hours. Lipid Profile: No results for input(s): CHOL, HDL, LDLCALC, TRIG, CHOLHDL, LDLDIRECT in the last 72 hours. Thyroid Function Tests: No results for input(s): TSH, T4TOTAL, FREET4, T3FREE, THYROIDAB in the last 72 hours. Anemia Panel: No results for input(s): VITAMINB12, FOLATE, FERRITIN, TIBC, IRON, RETICCTPCT in the last 72 hours. Urine analysis: No results found for: COLORURINE, APPEARANCEUR, LABSPEC, [place], GLUCOSEU, HGBUR, BILIRUBINUR, KETONESUR, PROTEINUR, UROBILINOGEN, NITRITE, LEUKOCYTESUR  Radiological Exams on Admission: CT Head Wo Contrast  Result Date: 07/09/2020 CLINICAL DATA:  84 year old female with transient ischemic attack. EXAM: CT HEAD WITHOUT CONTRAST TECHNIQUE:  Contiguous axial images were obtained from the base of the skull through the vertex without intravenous contrast. COMPARISON:  None. FINDINGS: Brain: There is moderate age-related atrophy and chronic microvascular ischemic changes. Probable focal area of old infarct involving the right cerebellar hemisphere. Additional area of old infarct involving the right periventricular white matter and left frontal cortex. There is no acute intracranial hemorrhage. No mass effect or midline shift. Bilateral low attenuating subdural fluid over the convexities may be related to volume loss or represent old hygroma. Vascular: No hyperdense vessel or unexpected calcification. Skull: Normal. Negative for fracture or focal lesion. Sinuses/Orbits: There is complete opacification of the left maxillary sinus. The remainder of the visualized paranasal sinuses and mastoid air cells are clear. Other: None IMPRESSION: 1. No acute intracranial hemorrhage. 2. Moderate age-related atrophy and chronic microvascular ischemic changes. Areas of old infarct. Electronically Signed   By: Anner Crete M.D.   On: 07/09/2020 01:55   Chest x-ray done today results posted under duplicate chart MRN [id number]. "CLINICAL DATA:   84 year old female with shortness of breath.  EXAM: PORTABLE CHEST 1 VIEW  COMPARISON:  None.  FINDINGS: There is cardiomegaly with mild vascular congestion and mild edema. There is eventration of the left hemidiaphragm. No focal consolidation, pleural effusion, or pneumothorax. No acute osseous pathology.  IMPRESSION: Cardiomegaly with findings of CHF.  No focal consolidation.   Electronically Signed   By: Anner Crete M.D.   On: 07/09/2020 00:21"   EKG done today (posted under duplicate chart MRN [id number]): Independently reviewed.  Sinus rhythm, LVH.  T wave inversions in inferior and lateral leads.  No prior tracing for comparison.  Assessment/Plan Principal Problem:   CHF exacerbation (HCC) Active Problems:   Acute hypoxemic respiratory failure (HCC)   Chest pain   Hypertensive emergency   CKD (chronic kidney disease) stage 4, GFR 15-29 ml/min (HCC)   Acute hypoxemic respiratory failure secondary to acute CHF exacerbation/ ?New onset CHF: No documented history of CHF and no prior echo results under Care Everywhere.  However, patient reports history of CHF and states she was on Lasix several years ago.  Unclear if she is currently being seen by a cardiologist.  Presenting with complaints of dyspnea, orthopnea, and bilateral lower extremity edema.  Appears volume overloaded on exam. BNP elevated at 285. Chest x-ray showing cardiomegaly with mild vascular congestion and mild edema.  Desatted to 89% on room air, currently requiring 2 L supplemental oxygen -Continue IV Lasix 20 mg twice daily.  Order echocardiogram.  Monitor intake and output, daily weights, and low-sodium diet with fluid restriction.  Addendum: Also has mild scattered wheezing on exam. No documented history of asthma or COPD. Will order DuoNebs.  Chest pain: Patient reports having intermittent chest heaviness for the past few days.  No documented history of CAD but patient reports having an MI in 2009  in [place] and states she was admitted to the hospital at that time.  Unclear if she is currently being seen by a cardiologist.  High-sensitivity troponin 72 >93.  EKG showing T wave inversions in inferior and lateral leads, no prior tracing for comparison.  EKG changes and troponin elevation could be due to demand ischemia in the setting of decompensated CHF/hypertensive emergency versus possible NSTEMI. -Cardiac monitoring.  Give aspirin 324 mg and start IV heparin for anticoagulation.  Continue to trend troponin.  Order echocardiogram.  Cardiology consulted by ED provider, recommendations pending.  Hypertensive emergency: Blood pressure significantly elevated with systolic up  to 210s in the ED and started on nitroglycerin infusion.  Does have EKG changes and troponin elevation in addition to pulmonary edema on chest x-ray. -Continue nitroglycerin infusion and monitor blood pressure very closely  Mild leukocytosis: Likely reactive.  Patient is afebrile.  Chest x-ray not suggestive of pneumonia. -Check UA to rule out UTI  Mild hypokalemia: Potassium 3.4.  Magnesium normal. -Replete potassium and continue to monitor  Hyperlipidemia -Continue Crestor  CKD stage IV: Stable.  Creatinine 1.9, baseline 1.7-1.9. -Continue monitor renal function with IV diuresis  History of CVA -Continue Plavix  DVT prophylaxis: Subcutaneous heparin Code Status: Full code-discussed with the patient. Family Communication: Granddaughter updated. Disposition Plan: Status is: Inpatient  Remains inpatient appropriate because:IV treatments appropriate due to intensity of illness or inability to take PO and Inpatient level of care appropriate due to severity of illness   Dispo: The patient is from: Home              Anticipated d/c is to: Home              Anticipated d/c date is: 3 days              Patient currently is not medically stable to d/c.  The medical decision making on this patient  was of high complexity and the patient is at high risk for clinical deterioration, therefore this is a level 3 visit.  Shela Leff MD [facility]  If 7PM-7AM, please contact night-coverage [email or web address]  07/09/2020, 6:36 AM

## 2020-07-09 NOTE — Progress Notes (Signed)
ANTICOAGULATION CONSULT NOTE - Follow Up Consult  Pharmacy Consult for Heparin Indication: chest pain/ACS  Allergies  Allergen Reactions  . Penicillins Nausea Only    Patient Measurements: Height: 5\' 7"  (170.2 cm) Weight: 90.7 kg (200 lb) IBW/kg (Calculated) : 61.6 Heparin Dosing Weight: 81 kg  Vital Signs: BP: 139/65 (08/25 1545) Pulse Rate: 73 (08/25 1545)  Labs: Recent Labs    07/09/20 0018 07/09/20 0251 07/09/20 1025 07/09/20 1700  HGB 10.0*  --   --   --   HCT 33.2*  --   --   --   PLT 244  --   --   --   HEPARINUNFRC  --   --   --  0.25*  CREATININE 1.95*  --   --   --   TROPONINIHS 72* 93* 115*  --     Estimated Creatinine Clearance: 25.7 mL/min (A) (by C-G formula based on SCr of 1.95 mg/dL (H)).   Medical History: Past Medical History:  Diagnosis Date  . Hypertension   . Renal disorder    CKD Stage IV  . Stroke [facility])     Assessment: 84 yr old female presented to ED with SOB, troponins mildly elevated, acute hypoxemic respiratory failure likely secondary to acute diastolic CHF exacerbation. Pharmacy was consulted to start heparin for R/O ACS; pt was on no anticoagulant PTA. Cardiology has been consulted.  Initial heparin level ~10 hrs after heparin 4000 units IV bolus X 1, followed by heparin infusion at 900 units/hr was 0.25 units/ml, which is below the goal range for this pt. Admission H/H 10.0/33.2, platelets 244. Per RN, no issues with IV or bleeding observed.  Goal of Therapy:  Heparin level 0.3-0.7 units/ml Monitor platelets by anticoagulation protocol: Yes   Plan:  Heparin 1100 units IV bolus X 1 Increase heparin infusion to 1050 units/hr Check 8-hr heparin level Monitor daily heparin level, CBC Monitor for signs/symptoms of bleeding F/U Cardiology evaluation  Gillermina Hu, PharmD, BCPS, [facility] Clinical Pharmacist 07/09/2020,5:49 PM

## 2020-07-09 NOTE — ED Notes (Signed)
Ordered Hosp Bed

## 2020-07-09 NOTE — ED Notes (Signed)
Dinner tray delivered.

## 2020-07-09 NOTE — Progress Notes (Signed)
ANTICOAGULATION CONSULT NOTE - Initial Consult  Pharmacy Consult for Heparin Indication: chest pain/ACS  Allergies  Allergen Reactions  . Penicillins Nausea Only    Patient Measurements: Height: 5\' 7"  (170.2 cm) Weight: 90.7 kg (200 lb) IBW/kg (Calculated) : 61.6 Heparin Dosing Weight: 74 kg  Vital Signs: Temp: 98.7 F (37.1 C) (08/25 0013) Temp Source: Oral (08/25 0013) BP: 168/72 (08/25 0530) Pulse Rate: 89 (08/25 0530)  Labs: Recent Labs    07/09/20 0018 07/09/20 0251  HGB 10.0*  --   HCT 33.2*  --   PLT 244  --   CREATININE 1.95*  --   TROPONINIHS 72* 93*    Estimated Creatinine Clearance: 25.7 mL/min (A) (by C-G formula based on SCr of 1.95 mg/dL (H)).   Medical History: Past Medical History:  Diagnosis Date  . Hypertension   . Renal disorder    CKD Stage IV  . Stroke [facility])     Medications:  See electronic med rec  Assessment: 84 y.o. F presents with SOB. To begin heparin for r/o ACS. No AC PTA. Hgb 10 on admission.  Goal of Therapy:  Heparin level 0.3-0.7 units/ml Monitor platelets by anticoagulation protocol: Yes   Plan:  Heparin IV bolus 4000 units Heparin gtt at 900 units/hr Will f/u heparin level in 8 hours Daily heparin level and CBC  Sherlon Handing, PharmD, BCPS Please see amion for complete clinical pharmacist phone list 07/09/2020,6:20 AM

## 2020-07-09 NOTE — Progress Notes (Signed)
  Echocardiogram 2D Echocardiogram has been performed.  Geoffery Lyons Swaim 07/09/2020, 10:13 AM

## 2020-07-09 NOTE — ED Provider Notes (Signed)
Patient seen/examined in the Emergency Department in conjunction with Advanced Practice Provider McDonald Patient reports shortness of breath Exam : awake/alert, tachypneic, crackles bilaterally Plan: suspect CHF and will need admission     Ripley Fraise, MD 07/09/20 [phone]

## 2020-07-09 NOTE — ED Notes (Signed)
[name] -- [phone]  Please call with updates

## 2020-07-10 DIAGNOSIS — R072 Precordial pain: Secondary | ICD-10-CM

## 2020-07-10 LAB — BASIC METABOLIC PANEL
Anion gap: 13 (ref 5–15)
BUN: 27 mg/dL — ABNORMAL HIGH (ref 8–23)
CO2: 25 mmol/L (ref 22–32)
Calcium: 8.9 mg/dL (ref 8.9–10.3)
Chloride: 101 mmol/L (ref 98–111)
Creatinine, Ser: 2.16 mg/dL — ABNORMAL HIGH (ref 0.44–1.00)
GFR calc Af Amer: 24 mL/min — ABNORMAL LOW (ref >60)
GFR calc non Af Amer: 21 mL/min — ABNORMAL LOW (ref >60)
Glucose, Bld: 110 mg/dL — ABNORMAL HIGH (ref 70–99)
Potassium: 3.7 mmol/L (ref 3.5–5.1)
Sodium: 139 mmol/L (ref 135–145)

## 2020-07-10 LAB — CBC
HCT: 32.4 % — ABNORMAL LOW (ref 36.0–46.0)
Hemoglobin: 9.8 g/dL — ABNORMAL LOW (ref 12.0–15.0)
MCH: 26.7 pg (ref 26.0–34.0)
MCHC: 30.2 g/dL (ref 30.0–36.0)
MCV: 88.3 fL (ref 80.0–100.0)
Platelets: 255 10*3/uL (ref 150–400)
RBC: 3.67 MIL/uL — ABNORMAL LOW (ref 3.87–5.11)
RDW: 14.9 % (ref 11.5–15.5)
WBC: 10.1 10*3/uL (ref 4.0–10.5)
nRBC: 0 % (ref 0.0–0.2)

## 2020-07-10 LAB — PHOSPHORUS: Phosphorus: 2.7 mg/dL (ref 2.5–4.6)

## 2020-07-10 LAB — TROPONIN I (HIGH SENSITIVITY)
Troponin I (High Sensitivity): 75 ng/L — ABNORMAL HIGH (ref <18)
Troponin I (High Sensitivity): 76 ng/L — ABNORMAL HIGH (ref <18)

## 2020-07-10 LAB — MAGNESIUM: Magnesium: 1.8 mg/dL (ref 1.7–2.4)

## 2020-07-10 LAB — HEPARIN LEVEL (UNFRACTIONATED)
Heparin Unfractionated: 0.48 IU/mL (ref 0.30–0.70)
Heparin Unfractionated: 0.47 IU/mL (ref 0.30–0.70)

## 2020-07-10 MED ORDER — AMLODIPINE BESYLATE 5 MG PO TABS
5.0000 mg | ORAL_TABLET | Freq: Every day | ORAL | Status: DC
  Administered 2020-07-10 – 2020-07-15 (×6): 5 mg via ORAL
  Filled 2020-07-10 (×6): qty 1

## 2020-07-10 MED ORDER — HYDRALAZINE HCL 50 MG PO TABS
50.0000 mg | ORAL_TABLET | Freq: Three times a day (TID) | ORAL | Status: DC
  Administered 2020-07-10 – 2020-07-11 (×6): 50 mg via ORAL
  Filled 2020-07-10 (×5): qty 1
  Filled 2020-07-10: qty 2

## 2020-07-10 MED ORDER — CARVEDILOL 6.25 MG PO TABS
6.2500 mg | ORAL_TABLET | Freq: Two times a day (BID) | ORAL | Status: DC
  Administered 2020-07-10 – 2020-07-15 (×10): 6.25 mg via ORAL
  Filled 2020-07-10 (×10): qty 1

## 2020-07-10 MED ORDER — LOSARTAN POTASSIUM 50 MG PO TABS: 100.0000 mg | ORAL_TABLET | Freq: Every day | ORAL | Status: DC

## 2020-07-10 NOTE — Progress Notes (Addendum)
PROGRESS NOTE  Jillian [name] ZOX:[id number] DOB: 08/28/1936 DOA: 07/08/2020 PCP: Patient, No Pcp Per   LOS: 1 day   Brief narrative: As per HPI,  Jillian [name] is a 84 y.o. female with medical history significant of hypertension, hyperlipidemia, obesity (BMI 31.32), CKD stage IV, CVA presented  to the ED with complaints of shortness of breath.  EMS reported diminished breath sounds upon auscultation of the lungs with rales.  She was given 0.4 of nitroglycerin, nebulizer treatment, and started on CPAP.  Improved with these treatments. She has been vaccinated against Covid.   Patient also reports history of MI in 2009 at a hospital in [place], [place].  Unclear if she is currently being seen by a cardiologist.   Denies history of asthma or COPD.  States she tried cigarettes when she was a teenager but denies longstanding history of cigarette smoking. ED Course: Afebrile.  WBC count 12.0.  Hemoglobin 10.0, at baseline per review of prior records under Care Everywhere.  Platelet count normal.  Sodium 138, potassium 3.4, chloride 105, bicarb 24, BUN 30, creatinine 1.9, and glucose 145.  Baseline creatinine 1.7-1.9.  Alkaline phosphatase chronically elevated, remainder of LFTs normal.  Magnesium normal.  BNP 285.  High-sensitivity troponin 72 >93.  EKG showing T wave inversions in inferior and lateral leads, no prior tracing for comparison.  SARS-CoV-2 PCR test negative. Chest x-ray showed cardiomegaly with mild vascular congestion and mild edema. Head CT negative for acute intracranial abnormality. Initially satting well on room air but later desatted to 89% and became tachypneic with respiratory rate in the 30s.  Blood pressure elevated with systolic up to 811B.  Patient was given IV Lasix 20 mg, DuoNeb treatment, and started on nitroglycerin infusion.   Assessment/Plan:  Principal Problem:   CHF exacerbation (HCC) Active Problems:   Acute hypoxemic respiratory failure (HCC)   Chest pain    Hypertensive emergency   CKD (chronic kidney disease) stage 4, GFR 15-29 ml/min (HCC)   Acute hypoxemic respiratory failure likely secondary to acute diastolic CHF exacerbation. No documented history of CHF and no prior echo, patient historically was on Lasix in the past.  Presenting with complaints of dyspnea, orthopnea, and bilateral lower extremity edema. BNP elevated at 285. Chest x-ray showed cardiomegaly with mild vascular congestion and mild edema.  Received supplemental oxygen initially.  On IV Lasix 20 twice daily.   2D echocardiogram with LV ejection fraction of 55 to 14% with diastolic dysfunction..  Strict intake and output charting,Daily weights fluid restriction.  Nebs for some wheezes.  COVID-19 was negative.  Cardiology has been consulted, awaiting recommendations.  Chest pain: With mild elevation of troponins.  No documented history of CAD but was admitted in [facility] for possible MI in the past.  EKG with T wave inversions in inferior and lateral leads.  Could be demand ischemia.  Patient did have elevated blood pressure on arrival.  On aspirin and IV heparin.  2D echocardiogram performed 07/06/2020 showed LV ejection fraction of 55 to 60%.  No regional wall motion abnormalities.  Diastolic filling impairment noted.    Patient was on nitroglycerin drip will wean off the drip if possible.  Resume oral medication.  Hypertensive emergency:Blood pressure significantly elevated with systolic up to 782NFA the ED and started on nitroglycerin drip due to hypertensive emergency and elevated troponin.  Blood pressure has improved at this time.  Will resume oral medications and gradually titrate off nitro drip if possible.  Mild leukocytosis: Likely reactive. Patient  is afebrile. Chest x-ray not negative for pneumonia.  Urinalysis negative for infection.  Mild hypokalemia: Magnesium normal.  Replenished orally  Improved.  Potassium 3.7.  Hyperlipidemia Continue  Crestor  CKD stage IV:  Creatinine 1.9, baseline 1.7-1.9. Creatinine slightly trended up to 2.1 today.  On IV Lasix.  History of CVA -Continue Plavix  DVT prophylaxis:  -Heparin drip   Code Status: Full code  Family Communication: I spoke with the patient's daughter [name] on the phone and updated her about the clinical condition of the patient.  Status is: Inpatient  Remains inpatient appropriate because:IV treatments appropriate due to intensity of illness or inability to take PO, Inpatient level of care appropriate due to severity of illness and Cardiology consultation and input   Dispo: The patient is from: Home              Anticipated d/c is to: Home              Anticipated d/c date is: 2 days              Patient currently is not medically stable to d/c.  Consultants: Cardiology  Procedures:  None  Antibiotics:  . None  Anti-infectives (From admission, onward)   None     Subjective: Today, patient was seen and examined at bedside.  Denies any chest pain, fever dizziness or headache.  Has mild shortness of breath but denies overt dyspnea.   Objective: Vitals:   07/10/20 0900 07/10/20 0945  BP: (!) 163/75 (!) 170/80  Pulse: 82 88  Resp: (!) 23 (!) 23  Temp:    SpO2: 100% 100%    Intake/Output Summary (Last 24 hours) at 07/10/2020 0951 Last data filed at 07/10/2020 0738 Gross per 24 hour  Intake 3 ml  Output 2200 ml  Net -2197 ml   Filed Weights   07/09/20 0014  Weight: 90.7 kg   Body mass index is 31.32 kg/m.   Physical Exam: GENERAL: Patient is alert awake and oriented. Not in obvious distress.  Obese on nasal cannula oxygen HENT: No scleral pallor or icterus. Pupils equally reactive to light. Oral mucosa is moist NECK: is supple, no gross swelling noted. CHEST:   Diminished breath sounds bilaterally.  CVS: S1 and S2 heard, no murmur. Regular rate and rhythm.  ABDOMEN: Soft, non-tender, bowel sounds are present. EXTREMITIES:  Bilateral lower extremity edema noted, trace CNS: Cranial nerves are intact. No focal motor deficits. SKIN: warm and dry without rashes.  Data Review: I have personally reviewed the following laboratory data and studies,  CBC: Recent Labs  Lab 07/09/20 0018 07/10/20 0359  WBC 12.0* 10.1  NEUTROABS 9.0*  --   HGB 10.0* 9.8*  HCT 33.2* 32.4*  MCV 88.5 88.3  PLT 244 967   Basic Metabolic Panel: Recent Labs  Lab 07/09/20 0018 07/09/20 0046 07/10/20 0359  NA 138  --  139  K 3.4*  --  3.7  CL 105  --  101  CO2 24  --  25  GLUCOSE 145*  --  110*  BUN 30*  --  27*  CREATININE 1.95*  --  2.16*  CALCIUM 9.0  --  8.9  MG  --  2.1 1.8  PHOS  --   --  2.7   Liver Function Tests: Recent Labs  Lab 07/09/20 0018  AST 27  ALT 27  ALKPHOS 170*  BILITOT 0.7  PROT 7.4  ALBUMIN 3.2*   No results for input(s): LIPASE, AMYLASE  in the last 168 hours. No results for input(s): AMMONIA in the last 168 hours. Cardiac Enzymes: No results for input(s): CKTOTAL, CKMB, CKMBINDEX, TROPONINI in the last 168 hours. BNP (last 3 results) Recent Labs    07/09/20 0018  BNP 285.1*    ProBNP (last 3 results) No results for input(s): PROBNP in the last 8760 hours.  CBG: No results for input(s): GLUCAP in the last 168 hours. Recent Results (from the past 240 hour(s))  SARS Coronavirus 2 by RT PCR (hospital order, performed in [facility] hospital lab) Nasopharyngeal Nasopharyngeal Swab     Status: None   Collection Time: 07/09/20 12:53 AM   Specimen: Nasopharyngeal Swab  Result Value Ref Range Status   SARS Coronavirus 2 NEGATIVE NEGATIVE Final    Comment: (NOTE) SARS-CoV-2 target nucleic acids are NOT DETECTED.  The SARS-CoV-2 RNA is generally detectable in upper and lower respiratory specimens during the acute phase of infection. The lowest concentration of SARS-CoV-2 viral copies this assay can detect is 250 copies / mL. A negative result does not preclude SARS-CoV-2 infection and  should not be used as the sole basis for treatment or other patient management decisions.  A negative result may occur with improper specimen collection / handling, submission of specimen other than nasopharyngeal swab, presence of viral mutation(s) within the areas targeted by this assay, and inadequate number of viral copies (<250 copies / mL). A negative result must be combined with clinical observations, patient history, and epidemiological information.  Fact Sheet for Patients:   StrictlyIdeas.no  Fact Sheet for Healthcare Providers: BankingDealers.co.za  This test is not yet approved or  cleared by the [place] FDA and has been authorized for detection and/or diagnosis of SARS-CoV-2 by FDA under an Emergency Use Authorization (EUA).  This EUA will remain in effect (meaning this test can be used) for the duration of the COVID-19 declaration under Section 564(b)(1) of the Act, 21 U.S.C. section 360bbb-3(b)(1), unless the authorization is terminated or revoked sooner.  Performed at [facility], [place] [address] [place], [place] [address]      Studies: CT Head Wo Contrast  Result Date: 07/09/2020 CLINICAL DATA:  84 year old female with transient ischemic attack. EXAM: CT HEAD WITHOUT CONTRAST TECHNIQUE: Contiguous axial images were obtained from the base of the skull through the vertex without intravenous contrast. COMPARISON:  None. FINDINGS: Brain: There is moderate age-related atrophy and chronic microvascular ischemic changes. Probable focal area of old infarct involving the right cerebellar hemisphere. Additional area of old infarct involving the right periventricular white matter and left frontal cortex. There is no acute intracranial hemorrhage. No mass effect or midline shift. Bilateral low attenuating subdural fluid over the convexities may be related to volume loss or represent old hygroma. Vascular: No hyperdense  vessel or unexpected calcification. Skull: Normal. Negative for fracture or focal lesion. Sinuses/Orbits: There is complete opacification of the left maxillary sinus. The remainder of the visualized paranasal sinuses and mastoid air cells are clear. Other: None IMPRESSION: 1. No acute intracranial hemorrhage. 2. Moderate age-related atrophy and chronic microvascular ischemic changes. Areas of old infarct. Electronically Signed   By: Anner Crete M.D.   On: 07/09/2020 01:55   ECHOCARDIOGRAM COMPLETE  Result Date: 07/09/2020    ECHOCARDIOGRAM REPORT   Patient Name:   [facility] Date of Exam: 07/09/2020 Medical Rec #:  [id number]     Height:       67.0 in Accession #:    [id number]    Weight:  200.0 lb Date of Birth:  Aug 01, 1936     BSA:          2.022 m Patient Age:    8 years      BP:           167/72 mmHg Patient Gender: F             HR:           94 bpm. Exam Location:  Inpatient Procedure: 2D Echo, Cardiac Doppler and Color Doppler Indications:    CHF-Acute Systolic 245.80 / D98.33  History:        Patient has no prior history of Echocardiogram examinations.                 Stroke; Risk Factors:Hypertension.  Sonographer:    Vickie Epley RDCS Referring Phys: [id number] [place]  1. Left ventricular ejection fraction, by estimation, is 55 to 60%. The left ventricle has normal function. The left ventricle has no regional wall motion abnormalities. There is mild concentric left ventricular hypertrophy. Indeterminate diastolic filling due to E-A fusion.  2. Right ventricular systolic function is normal. The right ventricular size is normal. Tricuspid regurgitation signal is inadequate for assessing PA pressure.  3. The mitral valve is degenerative. Mild mitral valve regurgitation.  4. The aortic valve is tricuspid. Aortic valve regurgitation is not visualized. Mild aortic valve sclerosis is present, with no evidence of aortic valve stenosis.  5. The inferior vena cava is normal in size  with greater than 50% respiratory variability, suggesting right atrial pressure of 3 mmHg. FINDINGS  Left Ventricle: Left ventricular ejection fraction, by estimation, is 55 to 60%. The left ventricle has normal function. The left ventricle has no regional wall motion abnormalities. Definity contrast agent was given IV to delineate the left ventricular  endocardial borders. The left ventricular internal cavity size was normal in size. There is mild concentric left ventricular hypertrophy. Indeterminate diastolic filling due to E-A fusion. Right Ventricle: The right ventricular size is normal. No increase in right ventricular wall thickness. Right ventricular systolic function is normal. Tricuspid regurgitation signal is inadequate for assessing PA pressure. Left Atrium: Left atrial size was normal in size. Right Atrium: Right atrial size was normal in size. Pericardium: Trivial pericardial effusion is present. The pericardial effusion is circumferential. Mitral Valve: The mitral valve is degenerative in appearance. There is mild calcification of the anterior and posterior mitral valve leaflet(s). Mild mitral valve regurgitation. Tricuspid Valve: The tricuspid valve is grossly normal. Tricuspid valve regurgitation is trivial. No evidence of tricuspid stenosis. Aortic Valve: The aortic valve is tricuspid. Aortic valve regurgitation is not visualized. Mild aortic valve sclerosis is present, with no evidence of aortic valve stenosis. There is mild calcification of the aortic valve. Pulmonic Valve: The pulmonic valve was grossly normal. Pulmonic valve regurgitation is not visualized. No evidence of pulmonic stenosis. Aorta: The aortic root is normal in size and structure. Venous: The inferior vena cava is normal in size with greater than 50% respiratory variability, suggesting right atrial pressure of 3 mmHg. IAS/Shunts: The atrial septum is grossly normal.  LEFT VENTRICLE PLAX 2D LVIDd:         5.40 cm      Diastology  LVIDs:         3.50 cm      LV e' lateral:   5.10 cm/s LV PW:         0.90 cm      LV E/e' lateral: 23.1  LV IVS:        0.90 cm      LV e' medial:    3.98 cm/s LVOT diam:     1.80 cm      LV E/e' medial:  29.6 LV SV:         51 LV SV Index:   25 LVOT Area:     2.54 cm  LV Volumes (MOD) LV vol d, MOD A2C: 101.0 ml LV vol d, MOD A4C: 98.9 ml LV vol s, MOD A2C: 53.6 ml LV vol s, MOD A4C: 47.3 ml LV SV MOD A2C:     47.4 ml LV SV MOD A4C:     98.9 ml LV SV MOD BP:      49.4 ml RIGHT VENTRICLE RV S prime:     11.00 cm/s TAPSE (M-mode): 1.8 cm LEFT ATRIUM             Index       RIGHT ATRIUM           Index LA diam:        4.50 cm 2.23 cm/m  RA Area:     13.30 cm LA Vol (A2C):   54.6 ml 27.00 ml/m RA Volume:   29.10 ml  14.39 ml/m LA Vol (A4C):   31.6 ml 15.63 ml/m LA Biplane Vol: 43.4 ml 21.46 ml/m  AORTIC VALVE LVOT Vmax:   117.00 cm/s LVOT Vmean:  74.400 cm/s LVOT VTI:    0.199 m  AORTA Ao Root diam: 2.80 cm MITRAL VALVE MV Area (PHT): 4.96 cm     SHUNTS MV Decel Time: 153 msec     Systemic VTI:  0.20 m MR Peak grad: 124.5 mmHg    Systemic Diam: 1.80 cm MR Mean grad: 87.0 mmHg MR Vmax:      558.00 cm/s MR Vmean:     450.0 cm/s MV E velocity: 118.00 cm/s MV A velocity: 35.90 cm/s MV E/A ratio:  3.29 Eleonore Chiquito MD Electronically signed by Eleonore Chiquito MD Signature Date/Time: 07/09/2020/12:14:01 PM    Final       Flora Lipps, MD  [facility] 07/10/2020

## 2020-07-10 NOTE — ED Notes (Signed)
Attempted report 

## 2020-07-10 NOTE — Progress Notes (Signed)
ANTICOAGULATION CONSULT NOTE - Follow Up Consult  Pharmacy Consult for Heparin Indication: chest pain/ACS  Allergies  Allergen Reactions  . Penicillins Nausea Only    Patient Measurements: Height: 5\' 7"  (170.2 cm) Weight: 90.7 kg (200 lb) IBW/kg (Calculated) : 61.6 Heparin Dosing Weight: 81 kg  Vital Signs: BP: 185/71 (08/26 0345) Pulse Rate: 93 (08/26 0345)  Labs: Recent Labs    07/09/20 0018 07/09/20 0251 07/09/20 1025 07/09/20 1700 07/10/20 0359 07/10/20 0400  HGB 10.0*  --   --   --  9.8*  --   HCT 33.2*  --   --   --  32.4*  --   PLT 244  --   --   --  255  --   HEPARINUNFRC  --   --   --  0.25*  --  0.48  CREATININE 1.95*  --   --   --  2.16*  --   TROPONINIHS 72* 93* 115*  --   --   --     Estimated Creatinine Clearance: 23.2 mL/min (A) (by C-G formula based on SCr of 2.16 mg/dL (H)).  Assessment: 84 yr old female presented to ED with SOB, troponins mildly elevated, acute hypoxemic respiratory failure likely secondary to acute diastolic CHF exacerbation. Pharmacy was consulted to start heparin for R/O ACS; pt was on no anticoagulant PTA. Cardiology has been consulted.  Heparin level therapeutic (0.48) on gtt at 1050 units/hr. No bleeding noted.  Goal of Therapy:  Heparin level 0.3-0.7 units/ml Monitor platelets by anticoagulation protocol: Yes   Plan:  Continue heparin 1050 units/hr Will f/u daily heparin level and CBC  Sherlon Handing, PharmD, BCPS Please see amion for complete clinical pharmacist phone list 07/10/2020,4:47 AM

## 2020-07-10 NOTE — ED Notes (Signed)
Cardiology at bedside.

## 2020-07-10 NOTE — ED Notes (Signed)
Pt's daughter updated.

## 2020-07-10 NOTE — Progress Notes (Signed)
ANTICOAGULATION CONSULT NOTE - Follow Up Consult  Pharmacy Consult for Heparin Indication: chest pain/ACS  Allergies  Allergen Reactions  . Penicillins Nausea Only    Patient Measurements: Height: 5\' 7"  (170.2 cm) Weight: 85.5 kg (188 lb 8 oz) IBW/kg (Calculated) : 61.6 Heparin Dosing Weight: 81 kg  Vital Signs: Temp: 98.5 F (36.9 C) (08/26 1353) Temp Source: Oral (08/26 1353) BP: 158/65 (08/26 1636) Pulse Rate: 79 (08/26 1636)  Labs: Recent Labs    07/09/20 0018 07/09/20 0018 07/09/20 0251 07/09/20 1025 07/09/20 1700 07/10/20 0359 07/10/20 0400 07/10/20 1456  HGB 10.0*  --   --   --   --  9.8*  --   --   HCT 33.2*  --   --   --   --  32.4*  --   --   PLT 244  --   --   --   --  255  --   --   HEPARINUNFRC  --   --   --   --  0.25*  --  0.48 0.47  CREATININE 1.95*  --   --   --   --  2.16*  --   --   TROPONINIHS 72*   < > 93* 115*  --   --   --  75*   < > = values in this interval not displayed.    Estimated Creatinine Clearance: 22.6 mL/min (A) (by C-G formula based on SCr of 2.16 mg/dL (H)).  Assessment: 84 yr old female presented to ED with SOB, troponins mildly elevated, acute hypoxemic respiratory failure likely secondary to acute diastolic CHF exacerbation. Pharmacy was consulted to start heparin for R/O ACS; pt was on no anticoagulant PTA. Cardiology has been consulted.  Heparin level this afternoon remains therapeutic (HL 0.47 << 0.48, goal of 0.3-0.7). Hgb 9.8, plts wnl  Goal of Therapy:  Heparin level 0.3-0.7 units/ml Monitor platelets by anticoagulation protocol: Yes   Plan:  - Continue Heparin at 1050 units/hr (10.5 ml/hr) - Will continue to monitor for any signs/symptoms of bleeding and will follow up with heparin level in the a.m.   Thank you for allowing pharmacy to be a part of this patient's care.  Alycia Rossetti, PharmD, BCPS Clinical Pharmacist Clinical phone for 07/10/2020: [phone] 07/10/2020 5:13 PM   **Pharmacist phone directory  can now be found on [email or web address] ([email or web address]).  Listed under [place].

## 2020-07-10 NOTE — Progress Notes (Signed)
Pt's daughter Bertha Stakes called with questions regarding pt's prognosis.  MD already communicated with one of pt's daughter and will update daughter tomorrow.

## 2020-07-10 NOTE — Consult Note (Addendum)
Cardiology Consultation:   Patient ID: Armelia Penton MRN: [id number]; DOB: 05-Sep-1936  Admit date: 07/08/2020 Date of Consult: 07/10/2020  Primary Care Provider: Patient, No Pcp Per [facility] Cardiologist: New   Patient Profile:   Najah Liverman is a 84 y.o. female with a hx of CKD IV, HTN, CVA, HLD and obesity who is being seen today for the evaluation of CHF and Elevated troponin at the request of Dr. Louanne Belton.   History of Present Illness:   Ms. Dung brought by EMS for sudden onset shortness of breath and chest tightness.  Patient currently having difficulty speaking so history is unreliable.  11 PM on 8/24 patient had sudden onset substernal chest pressure and shortness of breath however per ER note patient has worsening shortness of breath for past week or so.  EMS noted diminished breath sounds and given sublingual nitroglycerin x1, nebulizer and CPAP with improved symptoms.  Chest x-ray showed cardiomegaly.  BNP 285.  Creatinine 1.95>>2.16. Hs-Troponin 72>>93>>115. COVID negative.  CT of Head without acute abnormality. Started on IV heparin and nitro drip. BP improving. No recurrent chest pain. Got IV lasix 20mg  BID.   Patient reports hx of MI in 2009 but could not tell if she had stenting done or not. Says her speaking difficulty is new. Denies palpitations or LE edema.   Echo 07/09/20 1. Left ventricular ejection fraction, by estimation, is 55 to 60%. The  left ventricle has normal function. The left ventricle has no regional  wall motion abnormalities. There is mild concentric left ventricular  hypertrophy. Indeterminate diastolic  filling due to E-A fusion.  2. Right ventricular systolic function is normal. The right ventricular  size is normal. Tricuspid regurgitation signal is inadequate for assessing  PA pressure.  3. The mitral valve is degenerative. Mild mitral valve regurgitation.  4. The aortic valve is tricuspid. Aortic valve regurgitation is not    visualized. Mild aortic valve sclerosis is present, with no evidence of  aortic valve stenosis.  5. The inferior vena cava is normal in size with greater than 50%  respiratory variability, suggesting right atrial pressure of 3 mmHg.    Past Medical History:  Diagnosis Date  . Hypertension   . Renal disorder    CKD Stage IV  . Stroke [facility])     History reviewed. No pertinent surgical history.   Inpatient Medications: Scheduled Meds: . amLODipine  5 mg Oral Daily  . clopidogrel  75 mg Oral Daily  . furosemide  20 mg Intravenous BID  . hydrALAZINE  50 mg Oral TID  . rosuvastatin  20 mg Oral q1800  . sodium chloride flush  3 mL Intravenous Q12H   Continuous Infusions: . sodium chloride    . heparin 1,050 Units/hr (07/09/20 1856)  . nitroGLYCERIN 15 mcg/min (07/10/20 1041)   PRN Meds: sodium chloride, acetaminophen, ipratropium-albuterol, sodium chloride flush  Allergies:    Allergies  Allergen Reactions  . Penicillins Nausea Only    Social History:   Social History   Socioeconomic History  . Marital status: Unknown    Spouse name: Not on file  . Number of children: Not on file  . Years of education: Not on file  . Highest education level: Not on file  Occupational History  . Not on file  Tobacco Use  . Smoking status: Not on file  Substance and Sexual Activity  . Alcohol use: Not on file  . Drug use: Not on file  . Sexual activity: Not on file  Other  Topics Concern  . Not on file  Social History Narrative  . Not on file   Social Determinants of Health   Financial Resource Strain:   . Difficulty of Paying Living Expenses: Not on file  Food Insecurity:   . Worried About Charity fundraiser in the Last Year: Not on file  . Ran Out of Food in the Last Year: Not on file  Transportation Needs:   . Lack of Transportation (Medical): Not on file  . Lack of Transportation (Non-Medical): Not on file  Physical Activity:   . Days of Exercise per Week: Not on  file  . Minutes of Exercise per Session: Not on file  Stress:   . Feeling of Stress : Not on file  Social Connections:   . Frequency of Communication with Friends and Family: Not on file  . Frequency of Social Gatherings with Friends and Family: Not on file  . Attends Religious Services: Not on file  . Active Member of Clubs or Organizations: Not on file  . Attends Archivist Meetings: Not on file  . Marital Status: Not on file  Intimate Partner Violence:   . Fear of Current or Ex-Partner: Not on file  . Emotionally Abused: Not on file  . Physically Abused: Not on file  . Sexually Abused: Not on file    Family History:   No family hx of early CAD  ROS:  Please see the history of present illness.  All other ROS reviewed and negative.     Physical Exam/Data:   Vitals:   07/10/20 1045 07/10/20 1046 07/10/20 1130 07/10/20 1145  BP: (!) 177/83  (!) 163/85 (!) 165/82  Pulse: 84 84 76 77  Resp: (!) 30 20 (!) 22 20  Temp:      TempSrc:      SpO2: 100% 100% 100% 100%  Weight:      Height:        Intake/Output Summary (Last 24 hours) at 07/10/2020 1236 Last data filed at 07/10/2020 1153 Gross per 24 hour  Intake 3 ml  Output 3300 ml  Net -3297 ml   Last 3 Weights 07/09/2020  Weight (lbs) 200 lb  Weight (kg) 90.719 kg     Body mass index is 31.32 kg/m.  General:  Well nourished, well developed, in no acute distress HEENT: normal Lymph: no adenopathy Neck: no JVD Endocrine:  No thryomegaly Vascular: No carotid bruits; FA pulses 2+ bilaterally without bruits  Cardiac:  normal S1, S2; RRR; no murmur  Lungs:  Diminished breath sound at base Abd: soft, nontender, no hepatomegaly  Ext: no edema Musculoskeletal:  No deformities, BUE and BLE strength normal and equal Skin: warm and dry  Neuro: speaking difficulty noted. ? LUE weakness  Psych:  Normal affect   EKG:  The EKG was personally reviewed and demonstrates:  Sinus rhythm with LVH and repolarization  abnormality  Telemetry:  Telemetry was personally reviewed and demonstrates:  NSR  Relevant CV Studies: As above   Laboratory Data:  High Sensitivity Troponin:   Recent Labs  Lab 07/09/20 0018 07/09/20 0251 07/09/20 1025  TROPONINIHS 72* 93* 115*     Chemistry Recent Labs  Lab 07/09/20 0018 07/10/20 0359  NA 138 139  K 3.4* 3.7  CL 105 101  CO2 24 25  GLUCOSE 145* 110*  BUN 30* 27*  CREATININE 1.95* 2.16*  CALCIUM 9.0 8.9  GFRNONAA 23* 21*  GFRAA 27* 24*  ANIONGAP 9 13  Recent Labs  Lab 07/09/20 0018  PROT 7.4  ALBUMIN 3.2*  AST 27  ALT 27  ALKPHOS 170*  BILITOT 0.7   Hematology Recent Labs  Lab 07/09/20 0018 07/10/20 0359  WBC 12.0* 10.1  RBC 3.75* 3.67*  HGB 10.0* 9.8*  HCT 33.2* 32.4*  MCV 88.5 88.3  MCH 26.7 26.7  MCHC 30.1 30.2  RDW 14.9 14.9  PLT 244 255   BNP Recent Labs  Lab 07/09/20 0018  BNP 285.1*    Radiology/Studies:  CT Head Wo Contrast  Result Date: 07/09/2020 CLINICAL DATA:  84 year old female with transient ischemic attack. EXAM: CT HEAD WITHOUT CONTRAST TECHNIQUE: Contiguous axial images were obtained from the base of the skull through the vertex without intravenous contrast. COMPARISON:  None. FINDINGS: Brain: There is moderate age-related atrophy and chronic microvascular ischemic changes. Probable focal area of old infarct involving the right cerebellar hemisphere. Additional area of old infarct involving the right periventricular white matter and left frontal cortex. There is no acute intracranial hemorrhage. No mass effect or midline shift. Bilateral low attenuating subdural fluid over the convexities may be related to volume loss or represent old hygroma. Vascular: No hyperdense vessel or unexpected calcification. Skull: Normal. Negative for fracture or focal lesion. Sinuses/Orbits: There is complete opacification of the left maxillary sinus. The remainder of the visualized paranasal sinuses and mastoid air cells are clear.  Other: None IMPRESSION: 1. No acute intracranial hemorrhage. 2. Moderate age-related atrophy and chronic microvascular ischemic changes. Areas of old infarct. Electronically Signed   By: Anner Crete M.D.   On: 07/09/2020 01:55   ECHOCARDIOGRAM COMPLETE  Result Date: 07/09/2020    ECHOCARDIOGRAM REPORT   Patient Name:   [facility] Date of Exam: 07/09/2020 Medical Rec #:  [id number]     Height:       67.0 in Accession #:    [id number]    Weight:       200.0 lb Date of Birth:  1936-05-24     BSA:          2.022 m Patient Age:    89 years      BP:           167/72 mmHg Patient Gender: F             HR:           94 bpm. Exam Location:  Inpatient Procedure: 2D Echo, Cardiac Doppler and Color Doppler Indications:    CHF-Acute Systolic 941.74 / Y81.44  History:        Patient has no prior history of Echocardiogram examinations.                 Stroke; Risk Factors:Hypertension.  Sonographer:    Vickie Epley RDCS Referring Phys: [id number] [place]  1. Left ventricular ejection fraction, by estimation, is 55 to 60%. The left ventricle has normal function. The left ventricle has no regional wall motion abnormalities. There is mild concentric left ventricular hypertrophy. Indeterminate diastolic filling due to E-A fusion.  2. Right ventricular systolic function is normal. The right ventricular size is normal. Tricuspid regurgitation signal is inadequate for assessing PA pressure.  3. The mitral valve is degenerative. Mild mitral valve regurgitation.  4. The aortic valve is tricuspid. Aortic valve regurgitation is not visualized. Mild aortic valve sclerosis is present, with no evidence of aortic valve stenosis.  5. The inferior vena cava is normal in size with greater than 50% respiratory variability, suggesting right atrial pressure  of 3 mmHg. FINDINGS  Left Ventricle: Left ventricular ejection fraction, by estimation, is 55 to 60%. The left ventricle has normal function. The left ventricle has no  regional wall motion abnormalities. Definity contrast agent was given IV to delineate the left ventricular  endocardial borders. The left ventricular internal cavity size was normal in size. There is mild concentric left ventricular hypertrophy. Indeterminate diastolic filling due to E-A fusion. Right Ventricle: The right ventricular size is normal. No increase in right ventricular wall thickness. Right ventricular systolic function is normal. Tricuspid regurgitation signal is inadequate for assessing PA pressure. Left Atrium: Left atrial size was normal in size. Right Atrium: Right atrial size was normal in size. Pericardium: Trivial pericardial effusion is present. The pericardial effusion is circumferential. Mitral Valve: The mitral valve is degenerative in appearance. There is mild calcification of the anterior and posterior mitral valve leaflet(s). Mild mitral valve regurgitation. Tricuspid Valve: The tricuspid valve is grossly normal. Tricuspid valve regurgitation is trivial. No evidence of tricuspid stenosis. Aortic Valve: The aortic valve is tricuspid. Aortic valve regurgitation is not visualized. Mild aortic valve sclerosis is present, with no evidence of aortic valve stenosis. There is mild calcification of the aortic valve. Pulmonic Valve: The pulmonic valve was grossly normal. Pulmonic valve regurgitation is not visualized. No evidence of pulmonic stenosis. Aorta: The aortic root is normal in size and structure. Venous: The inferior vena cava is normal in size with greater than 50% respiratory variability, suggesting right atrial pressure of 3 mmHg. IAS/Shunts: The atrial septum is grossly normal.  LEFT VENTRICLE PLAX 2D LVIDd:         5.40 cm      Diastology LVIDs:         3.50 cm      LV e' lateral:   5.10 cm/s LV PW:         0.90 cm      LV E/e' lateral: 23.1 LV IVS:        0.90 cm      LV e' medial:    3.98 cm/s LVOT diam:     1.80 cm      LV E/e' medial:  29.6 LV SV:         51 LV SV Index:   25  LVOT Area:     2.54 cm  LV Volumes (MOD) LV vol d, MOD A2C: 101.0 ml LV vol d, MOD A4C: 98.9 ml LV vol s, MOD A2C: 53.6 ml LV vol s, MOD A4C: 47.3 ml LV SV MOD A2C:     47.4 ml LV SV MOD A4C:     98.9 ml LV SV MOD BP:      49.4 ml RIGHT VENTRICLE RV S prime:     11.00 cm/s TAPSE (M-mode): 1.8 cm LEFT ATRIUM             Index       RIGHT ATRIUM           Index LA diam:        4.50 cm 2.23 cm/m  RA Area:     13.30 cm LA Vol (A2C):   54.6 ml 27.00 ml/m RA Volume:   29.10 ml  14.39 ml/m LA Vol (A4C):   31.6 ml 15.63 ml/m LA Biplane Vol: 43.4 ml 21.46 ml/m  AORTIC VALVE LVOT Vmax:   117.00 cm/s LVOT Vmean:  74.400 cm/s LVOT VTI:    0.199 m  AORTA Ao Root diam: 2.80 cm MITRAL VALVE MV Area (PHT):  4.96 cm     SHUNTS MV Decel Time: 153 msec     Systemic VTI:  0.20 m MR Peak grad: 124.5 mmHg    Systemic Diam: 1.80 cm MR Mean grad: 87.0 mmHg MR Vmax:      558.00 cm/s MR Vmean:     450.0 cm/s MV E velocity: 118.00 cm/s MV A velocity: 35.90 cm/s MV E/A ratio:  3.29 Eleonore Chiquito MD Electronically signed by Eleonore Chiquito MD Signature Date/Time: 07/09/2020/12:14:01 PM    Final    TIMI Risk Score for Unstable Angina or Non-ST Elevation MI:   The patient's TIMI risk score is 4, which indicates a 20% risk of all cause mortality, new or recurrent myocardial infarction or need for urgent revascularization in the next 14 days.   New York Heart Association (NYHA) Functional Class NYHA Class II  Assessment and Plan:   1. NSTEMI -Unclear prior hx but seems presented with acute onset SOB and CP. Symptoms improved after SL nitro and CPAP.  Hs-Troponin 72>>93>>115. EKG with repolarization abnormality. Reports prior hx of MI in 2009 but hx is unavailable. Seems does not follows with cardiologist regularly.  - Echo LVEF of 55-60% and grade 2 DD. No WM abnormality.  - On heparin and statin.  - On Plavix for likely due to hx of CVA  2. Acute on chronic diastolic CHF - Echo LVEF of 55-60% and grade 2 DD. No WM abnormality.   - No strict I & O or daily weight - Does not look significant volume overload  3. Acute on CKD IV - Unknown baseline renal function - Scr worsen to 2.16 from 1.95 with diuresis - Hold Losartan - Consider nephrology consult  4. HTN - Elevated - On nitro drip - Hold Losartan given CKD  - Continue Amlodipine and Hydralazine >> can up titrate of high blood pressure and/or add BB  5. Speaking difficulty with hx of CVA - CT of head:1. No acute intracranial hemorrhage. 2. Moderate age-related atrophy and chronic microvascular ischemic changes. Areas of old infarct. - Pt report speaking difficulty is new. ? LUE weakness.  - Will defer management per primary  - On Plavix and statin   Dr. Harrington Challenger to see later today   For questions or updates, please contact [facility] Please consult [email or web address] for contact info under    Signed, Leanor Kail, PA  07/10/2020 12:36 PM   Patient seen and examined  I agreewith findings of B Bhagat above Pt is an 84 yo with HTN, Hx CVA, HL and CKD who presents for SOB, chst tightness and found to have an elevated troponin  (mild, peak at 115)     Currently the pt is breathgn a little better afiter IV lasix    Heparin and NTG started  BP has improved  ON exam,  Pt in NAD LUngs are relatively clear    Cardiac RRR  No S3  No signif murmurs  Abd is without masses   Mild diffuse tenderness Ext are without edema Neuro  Pt with diffiuclt speaking   For now would plan for continued medical Rx    Continue to follow troponins   Presentation may reflect diastoilc CHF and HTN     I am not convinced of ongoing ischemia   Concerning is speech difficulties which are ? New   CT with evid of old infarcts.  Will continue to follow   Dorris Carnes MD

## 2020-07-10 NOTE — ED Notes (Signed)
Ordered breakfast 

## 2020-07-10 NOTE — Progress Notes (Signed)
Nurse spoke with pt's daughters and plan of care was discussed.  Requested to speak with doctor. MD was paged.   Pt currently in bed. Bed alarm on and call bell within reach.

## 2020-07-11 ENCOUNTER — Inpatient Hospital Stay (HOSPITAL_COMMUNITY): Payer: Medicare Other

## 2020-07-11 DIAGNOSIS — R071 Chest pain on breathing: Secondary | ICD-10-CM

## 2020-07-11 LAB — BASIC METABOLIC PANEL
Anion gap: 9 (ref 5–15)
BUN: 24 mg/dL — ABNORMAL HIGH (ref 8–23)
CO2: 28 mmol/L (ref 22–32)
Calcium: 8.9 mg/dL (ref 8.9–10.3)
Chloride: 100 mmol/L (ref 98–111)
Creatinine, Ser: 2.04 mg/dL — ABNORMAL HIGH (ref 0.44–1.00)
GFR calc Af Amer: 26 mL/min — ABNORMAL LOW (ref >60)
GFR calc non Af Amer: 22 mL/min — ABNORMAL LOW (ref >60)
Glucose, Bld: 92 mg/dL (ref 70–99)
Potassium: 3.7 mmol/L (ref 3.5–5.1)
Sodium: 137 mmol/L (ref 135–145)

## 2020-07-11 LAB — CBC
HCT: 30.6 % — ABNORMAL LOW (ref 36.0–46.0)
Hemoglobin: 9.4 g/dL — ABNORMAL LOW (ref 12.0–15.0)
MCH: 26.9 pg (ref 26.0–34.0)
MCHC: 30.7 g/dL (ref 30.0–36.0)
MCV: 87.4 fL (ref 80.0–100.0)
Platelets: 227 10*3/uL (ref 150–400)
RBC: 3.5 MIL/uL — ABNORMAL LOW (ref 3.87–5.11)
RDW: 14.7 % (ref 11.5–15.5)
WBC: 8.9 10*3/uL (ref 4.0–10.5)
nRBC: 0 % (ref 0.0–0.2)

## 2020-07-11 LAB — HEPARIN LEVEL (UNFRACTIONATED): Heparin Unfractionated: 0.1 IU/mL — ABNORMAL LOW (ref 0.30–0.70)

## 2020-07-11 MED ORDER — FUROSEMIDE 40 MG PO TABS
40.0000 mg | ORAL_TABLET | Freq: Two times a day (BID) | ORAL | Status: DC
  Administered 2020-07-12 – 2020-07-15 (×7): 40 mg via ORAL
  Filled 2020-07-11 (×7): qty 1

## 2020-07-11 MED ORDER — HEPARIN BOLUS VIA INFUSION
2000.0000 [IU] | Freq: Once | INTRAVENOUS | Status: AC
  Administered 2020-07-11: 2000 [IU] via INTRAVENOUS
  Filled 2020-07-11: qty 2000

## 2020-07-11 MED ORDER — POTASSIUM CHLORIDE CRYS ER 10 MEQ PO TBCR
10.0000 meq | EXTENDED_RELEASE_TABLET | Freq: Every day | ORAL | Status: DC
  Administered 2020-07-12 – 2020-07-15 (×4): 10 meq via ORAL
  Filled 2020-07-11 (×4): qty 1

## 2020-07-11 MED ORDER — HEPARIN SODIUM (PORCINE) 5000 UNIT/ML IJ SOLN
5000.0000 [IU] | Freq: Three times a day (TID) | INTRAMUSCULAR | Status: DC
  Administered 2020-07-11 – 2020-07-15 (×12): 5000 [IU] via SUBCUTANEOUS
  Filled 2020-07-11 (×12): qty 1

## 2020-07-11 NOTE — Evaluation (Signed)
Physical Therapy Evaluation Patient Details Name: Jillian [name] MRN: [id number] DOB: 07/20/1936 Today's Date: 07/11/2020   History of Present Illness  84 yo female with onset of CHF and elevated troponin was admitted and found to have NSTEMI.  Had sternal pressure and SOB but more resolved now.  PMHx:  stroke, speech difficulties, CKD5, HTN, MI  Clinical Impression  Pt was seen for mobility on RW but was unable to use effectively.  HHA with direct help was more efficient, and will recommend she go to rehab due to her dense weakness, lack of regular family help and her mild confusion she is exhibiting.  Follow acutely for safety training along with progression of standing and gait skills.    Follow Up Recommendations SNF    Equipment Recommendations  None recommended by PT    Recommendations for Other Services       Precautions / Restrictions Precautions Precautions: Fall Precaution Comments: monitor vitals Restrictions Weight Bearing Restrictions: No      Mobility  Bed Mobility Overal bed mobility: Needs Assistance Bed Mobility: Supine to Sit     Supine to sit: Mod assist     General bed mobility comments: mod to support trunk and scoot out  Transfers Overall transfer level: Needs assistance Equipment used: Rolling walker (2 wheeled);1 person hand held assist Transfers: Sit to/from Stand Sit to Stand: Mod assist         General transfer comment: mod to stand from side of bed with direct assist as pt could not stand with RW  Ambulation/Gait Ambulation/Gait assistance: Min assist Gait Distance (Feet): 4 Feet Assistive device: 1 person hand held assist Gait Pattern/deviations: Step-to pattern;Wide base of support Gait velocity: reduc Gait velocity interpretation: <1.31 ft/sec, indicative of household ambulator General Gait Details: sidestep to chair with dense cues  Stairs            Wheelchair Mobility    Modified Rankin (Stroke Patients Only)        Balance Overall balance assessment: Needs assistance Sitting-balance support: Feet supported Sitting balance-Leahy Scale: Fair     Standing balance support: Bilateral upper extremity supported;During functional activity Standing balance-Leahy Scale: Poor                               Pertinent Vitals/Pain Pain Assessment: No/denies pain    Home Living Family/patient expects to be discharged to:: Private residence Living Arrangements: Children;Other relatives Available Help at Discharge: Family;Available PRN/intermittently Type of Home: House Home Access: Level entry     Home Layout: One level Home Equipment: None      Prior Function Level of Independence: Independent               Hand Dominance   Dominant Hand: Right    Extremity/Trunk Assessment   Upper Extremity Assessment Upper Extremity Assessment: Generalized weakness    Lower Extremity Assessment Lower Extremity Assessment: Generalized weakness    Cervical / Trunk Assessment Cervical / Trunk Assessment: Kyphotic  Communication   Communication: No difficulties  Cognition Arousal/Alertness: Awake/alert Behavior During Therapy: WFL for tasks assessed/performed Overall Cognitive Status: Within Functional Limits for tasks assessed                                        General Comments General comments (skin integrity, edema, etc.): pt was seen for mobility assessment and had  struggle to get up to stand and to chair    Exercises     Assessment/Plan    PT Assessment Patient needs continued PT services  PT Problem List Decreased strength;Decreased range of motion;Decreased activity tolerance;Decreased balance;Decreased mobility;Decreased coordination;Decreased knowledge of use of DME;Decreased safety awareness;Cardiopulmonary status limiting activity       PT Treatment Interventions DME instruction;Gait training;Functional mobility training;Therapeutic  activities;Therapeutic exercise;Balance training;Neuromuscular re-education;Patient/family education    PT Goals (Current goals can be found in the Care Plan section)  Acute Rehab PT Goals Patient Stated Goal: to get home to her granddson PT Goal Formulation: With patient Time For Goal Achievement: 07/25/20 Potential to Achieve Goals: Fair    Frequency Min 3X/week   Barriers to discharge Decreased caregiver support home with no consistent help    Co-evaluation               AM-PAC PT "6 Clicks" Mobility  Outcome Measure Help needed turning from your back to your side while in a flat bed without using bedrails?: A Little Help needed moving from lying on your back to sitting on the side of a flat bed without using bedrails?: A Lot Help needed moving to and from a bed to a chair (including a wheelchair)?: A Lot Help needed standing up from a chair using your arms (e.g., wheelchair or bedside chair)?: A Lot Help needed to walk in hospital room?: A Lot Help needed climbing 3-5 steps with a railing? : Total 6 Click Score: 12    End of Session Equipment Utilized During Treatment: Gait belt Activity Tolerance: Patient limited by fatigue;Treatment limited secondary to medical complications (Comment) Patient left: in chair;with call bell/phone within reach;with chair alarm set Nurse Communication: Mobility status PT Visit Diagnosis: Unsteadiness on feet (R26.81);History of falling (Z91.81);Muscle weakness (generalized) (M62.81);Adult, failure to thrive (R62.7)    Time: 1201-1226 PT Time Calculation (min) (ACUTE ONLY): 25 min   Charges:   PT Evaluation $PT Eval Moderate Complexity: 1 Mod PT Treatments $Therapeutic Activity: 8-22 mins       Ramond Dial 07/11/2020, 9:49 PM  Mee Hives, PT MS Acute Rehab Dept. Number: [place]

## 2020-07-11 NOTE — Progress Notes (Addendum)
Progress Note  Patient Name: Jillian [name] Date of Encounter: 07/11/2020  [facility] Cardiologist:   Subjective   Pt denies CP  Says her breathing is OK   Inpatient Medications    Scheduled Meds: . amLODipine  5 mg Oral Daily  . carvedilol  6.25 mg Oral BID WC  . clopidogrel  75 mg Oral Daily  . furosemide  20 mg Intravenous BID  . heparin  2,000 Units Intravenous Once  . hydrALAZINE  50 mg Oral TID  . rosuvastatin  20 mg Oral q1800  . sodium chloride flush  3 mL Intravenous Q12H   Continuous Infusions: . sodium chloride    . heparin 1,050 Units/hr (07/11/20 0847)   PRN Meds: sodium chloride, acetaminophen, ipratropium-albuterol, sodium chloride flush   Vital Signs    Vitals:   07/11/20 0458 07/11/20 0500 07/11/20 0801 07/11/20 0835  BP: (!) 156/67  (!) 127/56 128/61  Pulse: 70  64 66  Resp: 19  19   Temp: 99.8 F (37.7 C)  99.2 F (37.3 C) 98.7 F (37.1 C)  TempSrc: Oral  Oral Oral  SpO2: 98%  98% 92%  Weight:  85.8 kg    Height:        Intake/Output Summary (Last 24 hours) at 07/11/2020 1002 Last data filed at 07/11/2020 0958 Gross per 24 hour  Intake 1066.68 ml  Output 3250 ml  Net -2183.32 ml   Last 3 Weights 07/11/2020 07/10/2020 07/09/2020  Weight (lbs) 189 lb 2.5 oz 188 lb 8 oz 200 lb  Weight (kg) 85.8 kg 85.503 kg 90.719 kg      Telemetry    SR  - Personally Reviewed  ECG    No new - Personally Reviewed  Physical Exam   GEN: obese 84 yo in No acute distress.   Neck: No JVD Cardiac: RRR, no murmurs, Respiratory: Relatively clear to auscultation bilaterally. GI: Soft, nontender, non-distended  MS: Triv edema; Neuro:  Nonfocal  Psych: Normal affect   Labs    High Sensitivity Troponin:   Recent Labs  Lab 07/09/20 0018 07/09/20 0251 07/09/20 1025 07/10/20 1456 07/10/20 1725  TROPONINIHS 72* 93* 115* 75* 76*      Chemistry Recent Labs  Lab 07/09/20 0018 07/10/20 0359 07/11/20 0753  NA 138 139 137  K 3.4* 3.7 3.7  CL  105 101 100  CO2 24 25 28   GLUCOSE 145* 110* 92  BUN 30* 27* 24*  CREATININE 1.95* 2.16* 2.04*  CALCIUM 9.0 8.9 8.9  PROT 7.4  --   --   ALBUMIN 3.2*  --   --   AST 27  --   --   ALT 27  --   --   ALKPHOS 170*  --   --   BILITOT 0.7  --   --   GFRNONAA 23* 21* 22*  GFRAA 27* 24* 26*  ANIONGAP 9 13 9      Hematology Recent Labs  Lab 07/09/20 0018 07/10/20 0359 07/11/20 0753  WBC 12.0* 10.1 8.9  RBC 3.75* 3.67* 3.50*  HGB 10.0* 9.8* 9.4*  HCT 33.2* 32.4* 30.6*  MCV 88.5 88.3 87.4  MCH 26.7 26.7 26.9  MCHC 30.1 30.2 30.7  RDW 14.9 14.9 14.7  PLT 244 255 227    BNP Recent Labs  Lab 07/09/20 0018  BNP 285.1*     DDimer No results for input(s): DDIMER in the last 168 hours.   Radiology    ECHOCARDIOGRAM COMPLETE  Result Date: 07/09/2020  ECHOCARDIOGRAM REPORT   Patient Name:   Jillian [name] Date of Exam: 07/09/2020 Medical Rec #:  [id number]     Height:       67.0 in Accession #:    [id number]    Weight:       200.0 lb Date of Birth:  Mar 14, 1936     BSA:          2.022 m Patient Age:    84 years      BP:           167/72 mmHg Patient Gender: F             HR:           94 bpm. Exam Location:  Inpatient Procedure: 2D Echo, Cardiac Doppler and Color Doppler Indications:    CHF-Acute Systolic 834.19 / Q22.29  History:        Patient has no prior history of Echocardiogram examinations.                 Stroke; Risk Factors:Hypertension.  Sonographer:    Vickie Epley RDCS Referring Phys: [id number]  1. Left ventricular ejection fraction, by estimation, is 55 to 60%. The left ventricle has normal function. The left ventricle has no regional wall motion abnormalities. There is mild concentric left ventricular hypertrophy. Indeterminate diastolic filling due to E-A fusion.  2. Right ventricular systolic function is normal. The right ventricular size is normal. Tricuspid regurgitation signal is inadequate for assessing PA pressure.  3. The mitral valve is  degenerative. Mild mitral valve regurgitation.  4. The aortic valve is tricuspid. Aortic valve regurgitation is not visualized. Mild aortic valve sclerosis is present, with no evidence of aortic valve stenosis.  5. The inferior vena cava is normal in size with greater than 50% respiratory variability, suggesting right atrial pressure of 3 mmHg. FINDINGS  Left Ventricle: Left ventricular ejection fraction, by estimation, is 55 to 60%. The left ventricle has normal function. The left ventricle has no regional wall motion abnormalities. Definity contrast agent was given IV to delineate the left ventricular  endocardial borders. The left ventricular internal cavity size was normal in size. There is mild concentric left ventricular hypertrophy. Indeterminate diastolic filling due to E-A fusion. Right Ventricle: The right ventricular size is normal. No increase in right ventricular wall thickness. Right ventricular systolic function is normal. Tricuspid regurgitation signal is inadequate for assessing PA pressure. Left Atrium: Left atrial size was normal in size. Right Atrium: Right atrial size was normal in size. Pericardium: Trivial pericardial effusion is present. The pericardial effusion is circumferential. Mitral Valve: The mitral valve is degenerative in appearance. There is mild calcification of the anterior and posterior mitral valve leaflet(s). Mild mitral valve regurgitation. Tricuspid Valve: The tricuspid valve is grossly normal. Tricuspid valve regurgitation is trivial. No evidence of tricuspid stenosis. Aortic Valve: The aortic valve is tricuspid. Aortic valve regurgitation is not visualized. Mild aortic valve sclerosis is present, with no evidence of aortic valve stenosis. There is mild calcification of the aortic valve. Pulmonic Valve: The pulmonic valve was grossly normal. Pulmonic valve regurgitation is not visualized. No evidence of pulmonic stenosis. Aorta: The aortic root is normal in size and  structure. Venous: The inferior vena cava is normal in size with greater than 50% respiratory variability, suggesting right atrial pressure of 3 mmHg. IAS/Shunts: The atrial septum is grossly normal.  LEFT VENTRICLE PLAX 2D LVIDd:         5.40 cm  Diastology LVIDs:         3.50 cm      LV e' lateral:   5.10 cm/s LV PW:         0.90 cm      LV E/e' lateral: 23.1 LV IVS:        0.90 cm      LV e' medial:    3.98 cm/s LVOT diam:     1.80 cm      LV E/e' medial:  29.6 LV SV:         51 LV SV Index:   25 LVOT Area:     2.54 cm  LV Volumes (MOD) LV vol d, MOD A2C: 101.0 ml LV vol d, MOD A4C: 98.9 ml LV vol s, MOD A2C: 53.6 ml LV vol s, MOD A4C: 47.3 ml LV SV MOD A2C:     47.4 ml LV SV MOD A4C:     98.9 ml LV SV MOD BP:      49.4 ml RIGHT VENTRICLE RV S prime:     11.00 cm/s TAPSE (M-mode): 1.8 cm LEFT ATRIUM             Index       RIGHT ATRIUM           Index LA diam:        4.50 cm 2.23 cm/m  RA Area:     13.30 cm LA Vol (A2C):   54.6 ml 27.00 ml/m RA Volume:   29.10 ml  14.39 ml/m LA Vol (A4C):   31.6 ml 15.63 ml/m LA Biplane Vol: 43.4 ml 21.46 ml/m  AORTIC VALVE LVOT Vmax:   117.00 cm/s LVOT Vmean:  74.400 cm/s LVOT VTI:    0.199 m  AORTA Ao Root diam: 2.80 cm MITRAL VALVE MV Area (PHT): 4.96 cm     SHUNTS MV Decel Time: 153 msec     Systemic VTI:  0.20 m MR Peak grad: 124.5 mmHg    Systemic Diam: 1.80 cm MR Mean grad: 87.0 mmHg MR Vmax:      558.00 cm/s MR Vmean:     450.0 cm/s MV E velocity: 118.00 cm/s MV A velocity: 35.90 cm/s MV E/A ratio:  3.29 Eleonore Chiquito MD Electronically signed by Eleonore Chiquito MD Signature Date/Time: 07/09/2020/12:14:01 PM    Final     Cardiac Studies    Echo 07/09/20 1. Left ventricular ejection fraction, by estimation, is 55 to 60%. The  left ventricle has normal function. The left ventricle has no regional  wall motion abnormalities. There is mild concentric left ventricular  hypertrophy. Indeterminate diastolic  filling due to E-A fusion.  2. Right ventricular  systolic function is normal. The right ventricular  size is normal. Tricuspid regurgitation signal is inadequate for assessing  PA pressure.  3. The mitral valve is degenerative. Mild mitral valve regurgitation.  4. The aortic valve is tricuspid. Aortic valve regurgitation is not  visualized. Mild aortic valve sclerosis is present, with no evidence of  aortic valve stenosis.  5. The inferior vena cava is normal in size with greater than 50%  respiratory variability, suggesting right atrial pressure of 3 mmHg.    Patient Profile     Jillian [name] is a 84 y.o. female with a hx of CKD IV, HTN, CVA, HLD and obesity who is being seen today for the evaluation of CHF and Elevated troponin at the request of Dr. Louanne Belton.  Assessment & Plan    1Troponin elevation  Trivial   May be explained by demand in setting of patient's clinical presentation   Currently asymptomaitc  2  Acute on chronic diastolic CHF   Pt hsa diuresed 4.4 L   Looks relaxed  3  HTN  HTN either lead to presentation or was affecBP is much impoved  I would put the pt on lasix 40 and KCL 10    Will make appt for outpt f/u  4  Speech  Pt with new problems in speech  Defer to primary tem for evaluation  Ambulate as tolerated.  Will make sure pt hs f/u in clinic    We will sign off for now  For questions or updates, please contact [place] Please consult [email or web address] for contact info under        Signed, Dorris Carnes, MD  07/11/2020, 10:02 AM

## 2020-07-11 NOTE — Progress Notes (Signed)
PROGRESS NOTE  Jillian [name] ACZ:[id number] DOB: 1936-04-18 DOA: 07/08/2020 PCP: Patient, No Pcp Per   LOS: 2 days   Brief narrative: As per HPI,  Jillian [name] is a 84 y.o. female with medical history significant of hypertension, hyperlipidemia, obesity (BMI 31.32), CKD stage IV, CVA presented  to the ED with complaints of shortness of breath.  EMS reported diminished breath sounds upon auscultation of the lungs with rales.  She was given 0.4 of nitroglycerin, nebulizer treatment, and started on CPAP.  Improved with these treatments. She has been vaccinated against Covid.   Patient also reports history of MI in 2009 at a hospital in [place], [place].  Unclear if she is currently being seen by a cardiologist.   Denies history of asthma or COPD.  States she tried cigarettes when she was a teenager but denies longstanding history of cigarette smoking. ED Course: Afebrile.  WBC count 12.0.  Hemoglobin 10.0, at baseline per review of prior records under Care Everywhere.  Platelet count normal.  Sodium 138, potassium 3.4, chloride 105, bicarb 24, BUN 30, creatinine 1.9, and glucose 145.  Baseline creatinine 1.7-1.9.  Alkaline phosphatase chronically elevated, remainder of LFTs normal.  Magnesium normal.  BNP 285.  High-sensitivity troponin 72 >93.  EKG showing T wave inversions in inferior and lateral leads, no prior tracing for comparison.  SARS-CoV-2 PCR test negative. Chest x-ray showed cardiomegaly with mild vascular congestion and mild edema. Head CT negative for acute intracranial abnormality. Initially, satting well on room air but later desatted to 89% and became tachypneic with respiratory rate in the 30s.  Blood pressure elevated with systolic up to 109N.  Patient was given IV Lasix 20 mg, DuoNeb treatment, and started on nitroglycerin infusion.  Patient was then admitted to hospital for further evaluation and treatment.  Assessment/Plan:  Principal Problem:   CHF exacerbation  (HCC) Active Problems:   Acute hypoxemic respiratory failure (HCC)   Chest pain   Hypertensive emergency   CKD (chronic kidney disease) stage 4, GFR 15-29 ml/min (HCC)   Acute hypoxemic respiratory failure likely secondary to acute diastolic CHF exacerbation. No documented history of CHF and no prior echo, patient historically was on Lasix in the past.  Chest x-ray showed cardiomegaly with mild vascular congestion and mild edema.  Received supplemental oxygen initially.    2D echocardiogram on 07/10/2019 with LV ejection fraction of 55 to 23% with diastolic dysfunction.. Continue Strict intake and output charting, daily weights and fluid restriction.  COVID-19 was negative.  Cardiology on board.  Has diuresed significantly.  Appears comfortable.  Patient has been started on Lasix 40 twice daily with potassium supplements.  Chest pain, elevated troponins.    Likely demand ischemia.  No documented history of CAD but was admitted in [facility] for possible MI in the past.  EKG with T wave inversions in inferior and lateral leads. Spoke with daughter about it.   Patient did have elevated blood pressure on arrival.  On aspirin and IV heparin.  2D echocardiogram performed 07/06/2020 showed LV ejection fraction of 55 to 60%.  No regional wall motion abnormalities.  Diastolic filling impairment noted.    Patient was on nitroglycerin drip, will wean off the drip if possible.  Resumed oral medications for hypertension on 8/26. Follow cardiology recommedations   Speech difficulty.  Patient states that she has occasional difficulty.  Has history of CVA.  Will obtain MRI since there is concern for recent speech difficulty.  Hypertensive emergency:Blood pressure significantly elevated  with systolic up to 166AYT the ED. Was on nitroglycerin drip due to hypertensive emergency and elevated troponin.  Blood pressure has improved at this time. Continue oral medications with amlodipine, Coreg,  hydralazine.  Lasix has been added to the regimen.  Latest blood pressure is 114/48.  Mild leukocytosis: Likely reactive. Patient is afebrile. Chest x-ray not negative for pneumonia.  Urinalysis negative for infection.  Normal CBC today  Mild hypokalemia: Magnesium normal.  Replenished orally.  Improved.  Potassium of 3.7.  Hyperlipidemia Continue Crestor  CKD stage IV:  Patient's daughter stated that she had CKD stage IV.  Creatinine 1.9, baseline 1.7-1.9. Creatinine of 2.0 today.  History of CVA -Continue Plavix.  We will get new MRI due to difficulty expressing words.  DVT prophylaxis:  -Heparin drip, will discontinue today and put on subcu heparin.  Code Status: Full code  Family Communication:  I spoke with the patient's daughter [name] on the phone and updated her about the clinical condition of the patient.  Status is: Inpatient  Remains inpatient appropriate because:IV treatments appropriate due to intensity of illness or inability to take PO, Inpatient level of care appropriate due to severity of illness   Dispo: The patient is from: Home              Anticipated d/c is to: Home, will get PT evaluation              Anticipated d/c date is: 1 day              Patient currently is not medically stable to d/c.  Consultants: Cardiology  Procedures:  None  Antibiotics:  . None  Anti-infectives (From admission, onward)   None     Subjective:  Today, patient denies any chest pain, shortness of breath, fever or chills. States that she had some difficulty with speech.   Objective: Vitals:   07/11/20 0458 07/11/20 0801  BP: (!) 156/67 (!) 127/56  Pulse: 70 64  Resp: 19 19  Temp: 99.8 F (37.7 C) 99.2 F (37.3 C)  SpO2: 98% 98%    Intake/Output Summary (Last 24 hours) at 07/11/2020 0824 Last data filed at 07/11/2020 0800 Gross per 24 hour  Intake 946.68 ml  Output 3250 ml  Net -2303.32 ml   Filed Weights   07/09/20 0014 07/10/20 1353  07/11/20 0500  Weight: 90.7 kg 85.5 kg 85.8 kg   Body mass index is 29.63 kg/m.   Physical Exam: GENERAL: Patient is alert awake and oriented. Not in obvious distress.  Obese HENT: No scleral pallor or icterus. Pupils equally reactive to light. Oral mucosa is moist NECK: is supple, no gross swelling noted. CHEST:   Diminished breath sounds bilaterally.  CVS: S1 and S2 heard, no murmur. Regular rate and rhythm.  ABDOMEN: Soft, non-tender, bowel sounds are present. EXTREMITIES: Bilateral lower extremity edema noted, trace CNS: . No focal motor deficits.  Intermittent expressive difficulty. SKIN: warm and dry without rashes.  Data Review: I have personally reviewed the following laboratory data and studies,  CBC: Recent Labs  Lab 07/09/20 0018 07/10/20 0359  WBC 12.0* 10.1  NEUTROABS 9.0*  --   HGB 10.0* 9.8*  HCT 33.2* 32.4*  MCV 88.5 88.3  PLT 244 016   Basic Metabolic Panel: Recent Labs  Lab 07/09/20 0018 07/09/20 0046 07/10/20 0359  NA 138  --  139  K 3.4*  --  3.7  CL 105  --  101  CO2 24  --  25  GLUCOSE 145*  --  110*  BUN 30*  --  27*  CREATININE 1.95*  --  2.16*  CALCIUM 9.0  --  8.9  MG  --  2.1 1.8  PHOS  --   --  2.7   Liver Function Tests: Recent Labs  Lab 07/09/20 0018  AST 27  ALT 27  ALKPHOS 170*  BILITOT 0.7  PROT 7.4  ALBUMIN 3.2*   No results for input(s): LIPASE, AMYLASE in the last 168 hours. No results for input(s): AMMONIA in the last 168 hours. Cardiac Enzymes: No results for input(s): CKTOTAL, CKMB, CKMBINDEX, TROPONINI in the last 168 hours. BNP (last 3 results) Recent Labs    07/09/20 0018  BNP 285.1*    ProBNP (last 3 results) No results for input(s): PROBNP in the last 8760 hours.  CBG: No results for input(s): GLUCAP in the last 168 hours. Recent Results (from the past 240 hour(s))  SARS Coronavirus 2 by RT PCR (hospital order, performed in [facility] hospital lab) Nasopharyngeal Nasopharyngeal Swab     Status:  None   Collection Time: 07/09/20 12:53 AM   Specimen: Nasopharyngeal Swab  Result Value Ref Range Status   SARS Coronavirus 2 NEGATIVE NEGATIVE Final    Comment: (NOTE) SARS-CoV-2 target nucleic acids are NOT DETECTED.  The SARS-CoV-2 RNA is generally detectable in upper and lower respiratory specimens during the acute phase of infection. The lowest concentration of SARS-CoV-2 viral copies this assay can detect is 250 copies / mL. A negative result does not preclude SARS-CoV-2 infection and should not be used as the sole basis for treatment or other patient management decisions.  A negative result may occur with improper specimen collection / handling, submission of specimen other than nasopharyngeal swab, presence of viral mutation(s) within the areas targeted by this assay, and inadequate number of viral copies (<250 copies / mL). A negative result must be combined with clinical observations, patient history, and epidemiological information.  Fact Sheet for Patients:   StrictlyIdeas.no  Fact Sheet for Healthcare Providers: BankingDealers.co.za  This test is not yet approved or  cleared by the [place] FDA and has been authorized for detection and/or diagnosis of SARS-CoV-2 by FDA under an Emergency Use Authorization (EUA).  This EUA will remain in effect (meaning this test can be used) for the duration of the COVID-19 declaration under Section 564(b)(1) of the Act, 21 U.S.C. section 360bbb-3(b)(1), unless the authorization is terminated or revoked sooner.  Performed at [facility], [place] [address] [place], [place] [address]      Studies: ECHOCARDIOGRAM COMPLETE  Result Date: 07/09/2020    ECHOCARDIOGRAM REPORT   Patient Name:   [facility] Date of Exam: 07/09/2020 Medical Rec #:  [id number]     Height:       67.0 in Accession #:    [id number]    Weight:       200.0 lb Date of Birth:  [birth year]     BSA:           2.022 m Patient Age:    68 years      BP:           167/72 mmHg Patient Gender: F             HR:           94 bpm. Exam Location:  Inpatient Procedure: 2D Echo, Cardiac Doppler and Color Doppler Indications:    CHF-Acute Systolic 696.78 / L38.10  History:        Patient has no prior history of Echocardiogram examinations.                 Stroke; Risk Factors:Hypertension.  Sonographer:    Vickie Epley RDCS Referring Phys: [id number] [place]  1. Left ventricular ejection fraction, by estimation, is 55 to 60%. The left ventricle has normal function. The left ventricle has no regional wall motion abnormalities. There is mild concentric left ventricular hypertrophy. Indeterminate diastolic filling due to E-A fusion.  2. Right ventricular systolic function is normal. The right ventricular size is normal. Tricuspid regurgitation signal is inadequate for assessing PA pressure.  3. The mitral valve is degenerative. Mild mitral valve regurgitation.  4. The aortic valve is tricuspid. Aortic valve regurgitation is not visualized. Mild aortic valve sclerosis is present, with no evidence of aortic valve stenosis.  5. The inferior vena cava is normal in size with greater than 50% respiratory variability, suggesting right atrial pressure of 3 mmHg. FINDINGS  Left Ventricle: Left ventricular ejection fraction, by estimation, is 55 to 60%. The left ventricle has normal function. The left ventricle has no regional wall motion abnormalities. Definity contrast agent was given IV to delineate the left ventricular  endocardial borders. The left ventricular internal cavity size was normal in size. There is mild concentric left ventricular hypertrophy. Indeterminate diastolic filling due to E-A fusion. Right Ventricle: The right ventricular size is normal. No increase in right ventricular wall thickness. Right ventricular systolic function is normal. Tricuspid regurgitation signal is inadequate for assessing PA  pressure. Left Atrium: Left atrial size was normal in size. Right Atrium: Right atrial size was normal in size. Pericardium: Trivial pericardial effusion is present. The pericardial effusion is circumferential. Mitral Valve: The mitral valve is degenerative in appearance. There is mild calcification of the anterior and posterior mitral valve leaflet(s). Mild mitral valve regurgitation. Tricuspid Valve: The tricuspid valve is grossly normal. Tricuspid valve regurgitation is trivial. No evidence of tricuspid stenosis. Aortic Valve: The aortic valve is tricuspid. Aortic valve regurgitation is not visualized. Mild aortic valve sclerosis is present, with no evidence of aortic valve stenosis. There is mild calcification of the aortic valve. Pulmonic Valve: The pulmonic valve was grossly normal. Pulmonic valve regurgitation is not visualized. No evidence of pulmonic stenosis. Aorta: The aortic root is normal in size and structure. Venous: The inferior vena cava is normal in size with greater than 50% respiratory variability, suggesting right atrial pressure of 3 mmHg. IAS/Shunts: The atrial septum is grossly normal.  LEFT VENTRICLE PLAX 2D LVIDd:         5.40 cm      Diastology LVIDs:         3.50 cm      LV e' lateral:   5.10 cm/s LV PW:         0.90 cm      LV E/e' lateral: 23.1 LV IVS:        0.90 cm      LV e' medial:    3.98 cm/s LVOT diam:     1.80 cm      LV E/e' medial:  29.6 LV SV:         51 LV SV Index:   25 LVOT Area:     2.54 cm  LV Volumes (MOD) LV vol d, MOD A2C: 101.0 ml LV vol d, MOD A4C: 98.9 ml LV vol s, MOD A2C: 53.6 ml LV vol s, MOD A4C: 47.3 ml  LV SV MOD A2C:     47.4 ml LV SV MOD A4C:     98.9 ml LV SV MOD BP:      49.4 ml RIGHT VENTRICLE RV S prime:     11.00 cm/s TAPSE (M-mode): 1.8 cm LEFT ATRIUM             Index       RIGHT ATRIUM           Index LA diam:        4.50 cm 2.23 cm/m  RA Area:     13.30 cm LA Vol (A2C):   54.6 ml 27.00 ml/m RA Volume:   29.10 ml  14.39 ml/m LA Vol (A4C):   31.6  ml 15.63 ml/m LA Biplane Vol: 43.4 ml 21.46 ml/m  AORTIC VALVE LVOT Vmax:   117.00 cm/s LVOT Vmean:  74.400 cm/s LVOT VTI:    0.199 m  AORTA Ao Root diam: 2.80 cm MITRAL VALVE MV Area (PHT): 4.96 cm     SHUNTS MV Decel Time: 153 msec     Systemic VTI:  0.20 m MR Peak grad: 124.5 mmHg    Systemic Diam: 1.80 cm MR Mean grad: 87.0 mmHg MR Vmax:      558.00 cm/s MR Vmean:     450.0 cm/s MV E velocity: 118.00 cm/s MV A velocity: 35.90 cm/s MV E/A ratio:  3.29 Eleonore Chiquito MD Electronically signed by Eleonore Chiquito MD Signature Date/Time: 07/09/2020/12:14:01 PM    Final       Flora Lipps, MD  [facility] 07/11/2020

## 2020-07-11 NOTE — Plan of Care (Signed)
°  Problem: Education: °Goal: Ability to demonstrate management of disease process will improve °Outcome: Progressing °Goal: Ability to verbalize understanding of medication therapies will improve °Outcome: Progressing °Goal: Individualized Educational Video(s) °Outcome: Progressing °  °

## 2020-07-11 NOTE — Progress Notes (Signed)
Pt's daughter, Bertha Stakes, is requesting to speak with someone about medical POA and also asked that MD call her with updates on her mother. Will pass along.

## 2020-07-12 ENCOUNTER — Inpatient Hospital Stay (HOSPITAL_COMMUNITY): Payer: Medicare Other

## 2020-07-12 DIAGNOSIS — I639 Cerebral infarction, unspecified: Secondary | ICD-10-CM

## 2020-07-12 HISTORY — DX: Cerebral infarction, unspecified: I63.9

## 2020-07-12 LAB — CBC
HCT: 29.7 % — ABNORMAL LOW (ref 36.0–46.0)
Hemoglobin: 8.9 g/dL — ABNORMAL LOW (ref 12.0–15.0)
MCH: 25.7 pg — ABNORMAL LOW (ref 26.0–34.0)
MCHC: 30 g/dL (ref 30.0–36.0)
MCV: 85.8 fL (ref 80.0–100.0)
Platelets: 217 10*3/uL (ref 150–400)
RBC: 3.46 MIL/uL — ABNORMAL LOW (ref 3.87–5.11)
RDW: 14.5 % (ref 11.5–15.5)
WBC: 7.2 10*3/uL (ref 4.0–10.5)
nRBC: 0 % (ref 0.0–0.2)

## 2020-07-12 LAB — BASIC METABOLIC PANEL
Anion gap: 11 (ref 5–15)
BUN: 24 mg/dL — ABNORMAL HIGH (ref 8–23)
CO2: 27 mmol/L (ref 22–32)
Calcium: 8.8 mg/dL — ABNORMAL LOW (ref 8.9–10.3)
Chloride: 98 mmol/L (ref 98–111)
Creatinine, Ser: 2.05 mg/dL — ABNORMAL HIGH (ref 0.44–1.00)
GFR calc Af Amer: 26 mL/min — ABNORMAL LOW (ref >60)
GFR calc non Af Amer: 22 mL/min — ABNORMAL LOW (ref >60)
Glucose, Bld: 97 mg/dL (ref 70–99)
Potassium: 3.5 mmol/L (ref 3.5–5.1)
Sodium: 136 mmol/L (ref 135–145)

## 2020-07-12 MED ORDER — ASPIRIN 325 MG PO TABS
325.0000 mg | ORAL_TABLET | Freq: Every day | ORAL | Status: DC
  Administered 2020-07-12 – 2020-07-15 (×4): 325 mg via ORAL
  Filled 2020-07-12 (×4): qty 1

## 2020-07-12 NOTE — Plan of Care (Signed)
?  Problem: Activity: ?Goal: Capacity to carry out activities will improve ?Outcome: Progressing ?  ?

## 2020-07-12 NOTE — Progress Notes (Signed)
VASCULAR LAB    Carotid duplex completed.    Preliminary report:  See CV proc for preliminary results.  Ilay Capshaw, RVT 07/12/2020, 4:59 PM

## 2020-07-12 NOTE — Progress Notes (Addendum)
PROGRESS NOTE  Jillian [name] XLK:[id number] DOB: Feb 10, 1936 DOA: 07/08/2020 PCP: Patient, No Pcp Per   LOS: 3 days   Brief narrative: As per HPI,  Jillian [name] is a 84 y.o. female with medical history significant of hypertension, hyperlipidemia, obesity (BMI 31.32), CKD stage IV, CVA presented  to the ED with complaints of shortness of breath.  EMS reported diminished breath sounds upon auscultation of the lungs with rales.  She was given 0.4 of nitroglycerin, nebulizer treatment, and started on CPAP.  Improved with these treatments. She has been vaccinated against Covid.   Patient also reports history of MI in 2009 at a hospital in [place], [place].  Unclear if she is currently being seen by a cardiologist.   Denies history of asthma or COPD.  States she tried cigarettes when she was a teenager but denies longstanding history of cigarette smoking. ED Course: Afebrile.  WBC count 12.0.  Hemoglobin 10.0, at baseline per review of prior records under Care Everywhere.  Platelet count normal.  Sodium 138, potassium 3.4, chloride 105, bicarb 24, BUN 30, creatinine 1.9, and glucose 145.  Baseline creatinine 1.7-1.9.  Alkaline phosphatase chronically elevated, remainder of LFTs normal.  Magnesium normal.  BNP 285.  High-sensitivity troponin 72 >93.  EKG showing T wave inversions in inferior and lateral leads, no prior tracing for comparison.  SARS-CoV-2 PCR test negative. Chest x-ray showed cardiomegaly with mild vascular congestion and mild edema. Head CT negative for acute intracranial abnormality. Initially, satting well on room air but later desatted to 89% and became tachypneic with respiratory rate in the 30s.  Blood pressure elevated with systolic up to 366Y.  Patient was given IV Lasix 20 mg, DuoNeb treatment, and started on nitroglycerin infusion.  Patient was then admitted to hospital for further evaluation and treatment.  Assessment/Plan:  Principal Problem:   CHF exacerbation  (HCC) Active Problems:   Acute hypoxemic respiratory failure (HCC)   Chest pain   Hypertensive emergency   CKD (chronic kidney disease) stage 4, GFR 15-29 ml/min (HCC)   Acute hypoxemic respiratory failure likely secondary to acute diastolic CHF exacerbation. No documented history of CHF and no prior echo, patient historically was on Lasix in the past.  Chest x-ray showed cardiomegaly with mild vascular congestion and mild edema.  Received supplemental oxygen initially.    2D echocardiogram on 07/10/2019 with LV ejection fraction of 55 to 40% with diastolic dysfunction.. Continue Strict intake and output charting, daily weights and fluid restriction.  COVID-19 was negative.  Cardiology on board.  Has diuresed significantly with a net 5 L negative fluid balance and around 11 pounds weight loss.. On Lasix 40 twice daily with potassium supplements.  Closely monitor renal function.  Chest pain, elevated troponins.    Likely demand ischemia.  No documented history of CAD but was admitted in [facility] for possible MI in the past.  EKG with T wave inversions in inferior and lateral leads.  Patient did have elevated blood pressure on arrival.  On aspirin.  2D echocardiogram performed 07/06/2020 showed LV ejection fraction of 55 to 60%.  No regional wall motion abnormalities.  Diastolic filling impairment noted.    Patient was on nitroglycerin drip initially which has been weaned off.  Patient is currently on amlodipine, Coreg Lasix and hydralazine with improvement in blood pressure  Speech difficulty.   Has history of CVA. MRI of the brain was obtained yesterday showed numerous acute infarcts in the right  middle cerebral artery territory.  Old  ischemic changes noted.  Consult neurology. Spoke with neurology for consultation. Spoke with the patient's daughter about it. Speech has improved no focal neurological deficits noted.  Acute right ischemic MCA territory CVA with history of  CVA -Continue Plavix.  MRI no showing multiple acute infarct in the right MCA territory. Physical therapy on board and suggest skilled nursing facility placement. Will get carotid duplex ultrasound, 2D echocardiogram hemoglobin A1c, lipid profile, neurochecks PT OT and speech evaluation. Neurology has been consulted. We will hold her antihypertensives for systolic blood pressure less than 120  Hypertensive emergency:Blood pressure significantly elevated with systolic up to 259DGL the ED. Was on nitroglycerin drip due to hypertensive emergency and elevated troponin.  Blood pressure has improved at this time. On oral medications with amlodipine, Coreg, hydralazine.  Lasix has been added to the regimen.  Hold hydralazine for now.  Mild leukocytosis: Likely reactive. Patient is afebrile. Chest x-ray not negative for pneumonia.  Urinalysis negative for infection.  CBC has normalized at this time.   Mild hypokalemia: Magnesium normal.  Replenished orally.  Improved.  Latest potassium of 3.5.  Hyperlipidemia  Continue Crestor  CKD stage IV:  Patient's daughter stated that she had CKD stage IV.  Creatinine 1.9, baseline 1.7-1.9. Creatinine of 2.05 today.  DVT prophylaxis:   subcu heparin.  Code Status: Full code  Family Communication:  I again spoke with the patient's daughter [name] on the phone and updated her about the clinical condition of the patient.  Status is: Inpatient  Remains inpatient appropriate because:IV treatments appropriate due to intensity of illness or inability to take PO, Inpatient level of care appropriate due to severity of illness, MRI showing new acute infarcts,  Dispo: The patient is from: Home              Anticipated d/c is to: SNF as per PT recommendation. Patient does not wish skilled nursing facility. We will see how she progresses.              Anticipated d/c date is: 2-3 days              Patient currently is not medically stable to  d/c.  Consultants: Cardiology Neurology   Procedures:  None  Antibiotics:  . None  Anti-infectives (From admission, onward)   None     Subjective:  Today, patient feels ok. Speech has improved. Denies any focal weakness. Denies any chest pain, shortness of breath, fever or chills.   Objective: Vitals:   07/12/20 0039 07/12/20 0337  BP: (!) 153/57 (!) 120/51  Pulse: 65 62  Resp: 17 18  Temp: 98.9 F (37.2 C) 98.5 F (36.9 C)  SpO2: 99% 95%    Intake/Output Summary (Last 24 hours) at 07/12/2020 0848 Last data filed at 07/12/2020 0640 Gross per 24 hour  Intake 780 ml  Output 1350 ml  Net -570 ml   Filed Weights   07/10/20 1353 07/11/20 0500 07/12/20 0039  Weight: 85.5 kg 85.8 kg 85.9 kg   Body mass index is 29.66 kg/m.   Physical Exam:  General: Obese built, not in obvious distress, alert awake and oriented HENT:   No scleral pallor or icterus noted. Oral mucosa is moist.  Chest: Decreased breath sounds bilaterally CVS: S1 &S2 heard. No murmur.  Regular rate and rhythm. Abdomen: Soft, nontender, nondistended.  Bowel sounds are heard.   Extremities: No cyanosis, clubbing but trace lower extremity edema noted, peripheral pulses are palpable. Psych: Alert, awake and oriented, normal mood  CNS: Mild dysarthria. Power equal in all extremities.   Skin: Warm and dry.  No rashes noted.  Data Review: I have personally reviewed the following laboratory data and studies,  CBC: Recent Labs  Lab 07/09/20 0018 07/10/20 0359 07/11/20 0753 07/12/20 0240  WBC 12.0* 10.1 8.9 7.2  NEUTROABS 9.0*  --   --   --   HGB 10.0* 9.8* 9.4* 8.9*  HCT 33.2* 32.4* 30.6* 29.7*  MCV 88.5 88.3 87.4 85.8  PLT 244 255 227 254   Basic Metabolic Panel: Recent Labs  Lab 07/09/20 0018 07/09/20 0046 07/10/20 0359 07/11/20 0753 07/12/20 0240  NA 138  --  139 137 136  K 3.4*  --  3.7 3.7 3.5  CL 105  --  101 100 98  CO2 24  --  25 28 27   GLUCOSE 145*  --  110* 92 97  BUN 30*   --  27* 24* 24*  CREATININE 1.95*  --  2.16* 2.04* 2.05*  CALCIUM 9.0  --  8.9 8.9 8.8*  MG  --  2.1 1.8  --   --   PHOS  --   --  2.7  --   --    Liver Function Tests: Recent Labs  Lab 07/09/20 0018  AST 27  ALT 27  ALKPHOS 170*  BILITOT 0.7  PROT 7.4  ALBUMIN 3.2*   No results for input(s): LIPASE, AMYLASE in the last 168 hours. No results for input(s): AMMONIA in the last 168 hours. Cardiac Enzymes: No results for input(s): CKTOTAL, CKMB, CKMBINDEX, TROPONINI in the last 168 hours. BNP (last 3 results) Recent Labs    07/09/20 0018  BNP 285.1*    ProBNP (last 3 results) No results for input(s): PROBNP in the last 8760 hours.  CBG: No results for input(s): GLUCAP in the last 168 hours. Recent Results (from the past 240 hour(s))  SARS Coronavirus 2 by RT PCR (hospital order, performed in [facility] hospital lab) Nasopharyngeal Nasopharyngeal Swab     Status: None   Collection Time: 07/09/20 12:53 AM   Specimen: Nasopharyngeal Swab  Result Value Ref Range Status   SARS Coronavirus 2 NEGATIVE NEGATIVE Final    Comment: (NOTE) SARS-CoV-2 target nucleic acids are NOT DETECTED.  The SARS-CoV-2 RNA is generally detectable in upper and lower respiratory specimens during the acute phase of infection. The lowest concentration of SARS-CoV-2 viral copies this assay can detect is 250 copies / mL. A negative result does not preclude SARS-CoV-2 infection and should not be used as the sole basis for treatment or other patient management decisions.  A negative result may occur with improper specimen collection / handling, submission of specimen other than nasopharyngeal swab, presence of viral mutation(s) within the areas targeted by this assay, and inadequate number of viral copies (<250 copies / mL). A negative result must be combined with clinical observations, patient history, and epidemiological information.  Fact Sheet for Patients:    StrictlyIdeas.no  Fact Sheet for Healthcare Providers: BankingDealers.co.za  This test is not yet approved or  cleared by the [place] FDA and has been authorized for detection and/or diagnosis of SARS-CoV-2 by FDA under an Emergency Use Authorization (EUA).  This EUA will remain in effect (meaning this test can be used) for the duration of the COVID-19 declaration under Section 564(b)(1) of the Act, 21 U.S.C. section 360bbb-3(b)(1), unless the authorization is terminated or revoked sooner.  Performed at [facility], [place] [address] [place], [place] [address]  Studies: MR BRAIN WO CONTRAST  Result Date: 07/11/2020 CLINICAL DATA:  TIA.  Slurred speech. EXAM: MRI HEAD WITHOUT CONTRAST TECHNIQUE: Multiplanar, multiecho pulse sequences of the brain and surrounding structures were obtained without intravenous contrast. COMPARISON:  Head CT 2 days ago. FINDINGS: Brain: Numerous acute infarctions in the right hemisphere scattered within the right middle cerebral artery territory. This includes the anterior temporal lobe, areas within the insula. The right parietal cortical and subcortical brain, right caudate head and a few scattered punctate infarctions within the white matter. The areas of infarction show mild swelling but there is no evidence of hemorrhage or mass effect. Elsewhere, the brainstem appears normal. Old small vessel infarctions in the inferior right cerebellum. Old small vessel infarctions affect the right lateral thalamus and radiating white matter tracts enter seen throughout the cerebral hemispheric deep and subcortical white matter. Old left frontoparietal junction cortical infarction. No mass lesion, hydrocephalus or extra-axial collection Vascular: Major vessels at the base of the brain show flow. Skull and upper cervical spine: Negative Sinuses/Orbits: Mucosal inflammatory changes of the left maxillary sinus. Other  sinuses clear. Orbits negative. Other: None IMPRESSION: 1. Numerous acute infarctions scattered within the right hemisphere within the right middle cerebral artery territory. Mild swelling but no hemorrhage or mass effect. 2. Extensive old ischemic changes elsewhere throughout the brain as outlined above. Electronically Signed   By: Nelson Chimes M.D.   On: 07/11/2020 14:55      Flora Lipps, MD  [facility] 07/12/2020

## 2020-07-12 NOTE — TOC Initial Note (Signed)
Transition of Care [facility]) - Initial/Assessment Note    Patient Details  Name: Jillian [name] MRN: [id number] Date of Birth: 11/16/1935  Transition of Care [facility]) CM/SW Contact:    Benard Halsted, LCSW Phone Number: 07/12/2020, 1:43 PM  Clinical Narrative:                 CSW received consult for possible SNF placement at time of discharge. CSW spoke with patient's granddaughter, Bertha Stakes, regarding PT recommendation of SNF placement at time of discharge. Patient's granddaughter stated that patient will be going home with her at discharge rather than SNF and reported that she would like home health services. CSW confirmed her address: [address] [place]. Also confirmed PCP Hassell Done, [facility]). She has no home health preference; will provide Medicare [place] ratings list. CSW discussed equipment needs with patient and she requested a rolling walker w/seat and a 3in1. She requested assistance with obtaining HCPOA paperwork; CSW explained that currently patient is disoriented and not able to complete paperwork legally but that she can follow up with the clerk of courts. She requests that she be the main contact for patient (patient raised Bertha Stakes so she views her like a mother). No further questions reported at this time.     Expected Discharge Plan: [place] Barriers to Discharge: Continued Medical Work up   Patient Goals and CMS Choice Patient states their goals for this hospitalization and ongoing recovery are:: Rehab at home CMS Medicare.gov Compare Post Acute Care list provided to:: Patient Represenative (must comment) (Granddaughter, Ozella Almond) Choice offered to / list presented to :  (Granddaughter)  Expected Discharge Plan and Services Expected Discharge Plan: [place] In-house Referral: Clinical Social Work Discharge Planning Services: CM Consult Post Acute Care Choice: [place] arrangements for the past 2 months:  [place]                                      Prior Living Arrangements/Services Living arrangements for the past 2 months: Single Family Home Lives with:: Minor Children Patient language and need for interpreter reviewed:: Yes Do you feel safe going back to the place where you live?: Yes      Need for Family Participation in Patient Care: Yes (Comment) Care giver support system in place?: Yes (comment)   Criminal Activity/Legal Involvement Pertinent to Current Situation/Hospitalization: No - Comment as needed  Activities of Daily Living      Permission Sought/Granted Permission sought to share information with : Facility Sport and exercise psychologist, Family Supports Permission granted to share information with : Yes, Verbal Permission Granted  Share Information with NAME: Bertha Stakes  Permission granted to share info w AGENCY: HH  Permission granted to share info w Relationship: Granddaughter  Permission granted to share info w Contact Information: [phone]  Emotional Assessment Appearance:: Appears stated age Attitude/Demeanor/Rapport: Unable to Assess Affect (typically observed): Unable to Assess Orientation: : Oriented to Self Alcohol / Substance Use: Not Applicable Psych Involvement: No (comment)  Admission diagnosis:  CHF exacerbation ([place]) [I50.9] Hypertensive urgency [I16.0] Congestive heart failure, unspecified HF chronicity, unspecified heart failure type [facility]) [I50.9] Patient Active Problem List   Diagnosis Date Noted   Acute CVA (cerebrovascular accident) ([place]) 07/12/2020   CHF exacerbation ([place]) 07/09/2020   Acute hypoxemic respiratory failure ([place]) 07/09/2020   Chest pain 07/09/2020   Hypertensive emergency 07/09/2020   CKD (  chronic kidney disease) stage 4, GFR 15-29 ml/min ([place]) 07/09/2020   PCP:  Patient, No Pcp Per Pharmacy:   [facility]  [place] [address] Phone: [phone] Fax: [phone]     Social Determinants of Health (SDOH) Interventions    Readmission Risk Interventions No flowsheet data found.

## 2020-07-12 NOTE — Consult Note (Addendum)
Neurology Consultation  Reason for Consult: Strokes On MRI   Referring Physician: Dr. Louanne Belton  CC: SOB  History is obtained from: patient and medical chart   HPI: Jillian [name] is a 84 y.o. female with past medical history of HTN, HLD, CHF,  prior stroke (per patient-no residual deficits), CKD 4, and obesity who presented to the hospital for shortness of breath on 8/25. She states that she felt a "sloshing" sound in her chest when she moved and had new onset of orthopnea as well as breathing difficulty as though she was having an attack of asthma. Per chart- EMS noted diminished BS with rales, and she was given a nebulizer treatment, 0.4mg  of nitroglycerin and placed on CPAP with improvement.  She then became tachypneic and hypertensive in the 242'P systolic.  Per patient she noticed speech difficulties at this time as well, where she was having trouble getting her words out and speech was slurred.    CT head with no acute abnormality. Subsequent MRI revealed 3 prominent acute right MCA territory acute ischemic infarctions in addition to a few punctate foci of DWI signal abnormality.   Home medications include Plavix and rosuvastatin.   Patient denies headache, vision abnormalities, numbness/tingling or weakness, CP, SOB, N/V/D, fevers or chills at this time.    LKW: 07/09/2020 tpa given?: No, outside of tPA window  Premorbid modified Rankin scale (mRS): 1-No significant disability at baseline and can perform usual duties    ROS: A 14 point ROS was performed and is negative except as noted in the HPI.   Past Medical History:  Diagnosis Date  . Hypertension   . Renal disorder    CKD Stage IV  . Stroke [facility])    Hypertension Emergency (SBP > 180 or DBP > 120 & end organ damage) and CKD Stage 4 (GFR 15-29)   History reviewed. No pertinent family history.  Social History:   has no history on file for tobacco use, alcohol use, and drug use.  Home Medications: No current  facility-administered medications on file prior to encounter.   Current Outpatient Medications on File Prior to Encounter  Medication Sig Dispense Refill  . amLODipine (NORVASC) 5 MG tablet Take 5 mg by mouth daily.    . clopidogrel (PLAVIX) 75 MG tablet Take 75 mg by mouth daily.    . hydrALAZINE (APRESOLINE) 50 MG tablet Take 50 mg by mouth 3 (three) times daily.    Marland Kitchen losartan (COZAAR) 100 MG tablet Take 100 mg by mouth daily.    . rosuvastatin (CRESTOR) 20 MG tablet Take 20 mg by mouth daily.      Inpatient Medications:  Current Facility-Administered Medications:  .  0.9 %  sodium chloride infusion, 250 mL, Intravenous, PRN, Shela Leff, MD .  acetaminophen (TYLENOL) tablet 650 mg, 650 mg, Oral, Q4H PRN, Shela Leff, MD, 650 mg at 07/12/20 0013 .  amLODipine (NORVASC) tablet 5 mg, 5 mg, Oral, Daily, Pokhrel, Laxman, MD, 5 mg at 07/11/20 5361 .  carvedilol (COREG) tablet 6.25 mg, 6.25 mg, Oral, BID WC, Bhagat, Bhavinkumar, PA, 6.25 mg at 07/11/20 1633 .  clopidogrel (PLAVIX) tablet 75 mg, 75 mg, Oral, Daily, Shela Leff, MD, 75 mg at 07/11/20 0838 .  furosemide (LASIX) tablet 40 mg, 40 mg, Oral, BID, Fay Records, MD .  heparin injection 5,000 Units, 5,000 Units, Subcutaneous, Q8H, Pokhrel, Laxman, MD, 5,000 Units at 07/12/20 0511 .  ipratropium-albuterol (DUONEB) 0.5-2.5 (3) MG/3ML nebulizer solution 3 mL, 3 mL, Nebulization, Q6H PRN, Rathore,  Wandra Feinstein, MD .  potassium chloride (KLOR-CON) CR tablet 10 mEq, 10 mEq, Oral, Daily, Dorris Carnes V, MD .  rosuvastatin (CRESTOR) tablet 20 mg, 20 mg, Oral, q1800, Shela Leff, MD, 20 mg at 07/11/20 1633 .  sodium chloride flush (NS) 0.9 % injection 3 mL, 3 mL, Intravenous, Q12H, Shela Leff, MD, 3 mL at 07/11/20 2136 .  sodium chloride flush (NS) 0.9 % injection 3 mL, 3 mL, Intravenous, PRN, Shela Leff, MD   Exam: Current vital signs: BP (!) 124/54 (BP Location: Left Arm)   Pulse 65   Temp 99.5 F  (37.5 C) (Oral)   Resp 18   Ht 5\' 7"  (1.702 m)   Wt 85.9 kg   SpO2 99%   BMI 29.66 kg/m  Vital signs in last 24 hours: Temp:  [98.4 F (36.9 C)-99.6 F (37.6 C)] 99.5 F (37.5 C) (08/28 0851) Pulse Rate:  [60-65] 65 (08/28 0851) Resp:  [15-19] 18 (08/28 0851) BP: (114-153)/(48-67) 124/54 (08/28 0851) SpO2:  [95 %-99 %] 99 % (08/28 0851) Weight:  [85.9 kg] 85.9 kg (08/28 0039)  GENERAL: Awake, alert in NAD HENT: - Normocephalic and atraumatic, dry mm, no Thyromegally Eyes: normal conjunctiva LUNGS - Clear to auscultation bilaterally with no wheezes CV - S1S2 RRR, no m/r/g, equal pulses bilaterally. ABDOMEN - Soft, nontender, nondistended with normoactive BS Ext: warm, well perfused, intact peripheral pulses, trace edema Psych: normal affect   NEURO:  Mental Status: AA&Ox3. Speech is dysarthric. Some hesitancy with speech output. Otherwise, naming, repetition, fluency, and comprehension are intact. Cranial Nerves: PERRL 2 mm brisk. Visual fields full. EOMI. No facial asymmetry, facial sensation intact, hearing intact, tongue/uvula/soft palate midline, normal sternocleidomastoid and trapezius muscle strength. No evidence of tongue atrophy or fasciculations Motor: 5/5 in all 4 extremities. Tone and bulk are normal Sensation- Intact to light touch bilaterally Coordination: FTN intact bilaterally, no ataxia in BLE. Gait- Deferred  NIHSS 1a Level of Conscious.: 0 1b LOC Questions: 0 1c LOC Commands: 0 2 Best Gaze: 0 3 Visual: 0 4 Facial Palsy: 1 5a Motor Arm - left: 0 5b Motor Arm - Right: 0 6a Motor Leg - Left: 0 6b Motor Leg - Right: 0 7 Limb Ataxia: 0 8 Sensory: 1 9 Best Language: 1 10 Dysarthria: 1 11 Extinct. and Inatten.: 0 TOTAL: 4   Labs I have reviewed labs in epic and the results pertinent to this consultation are:   CBC    Component Value Date/Time   WBC 7.2 07/12/2020 0240   RBC 3.46 (L) 07/12/2020 0240   HGB 8.9 (L) 07/12/2020 0240   HCT 29.7 (L)  07/12/2020 0240   PLT 217 07/12/2020 0240   MCV 85.8 07/12/2020 0240   MCH 25.7 (L) 07/12/2020 0240   MCHC 30.0 07/12/2020 0240   RDW 14.5 07/12/2020 0240   LYMPHSABS 1.9 07/09/2020 0018   MONOABS 0.8 07/09/2020 0018   EOSABS 0.1 07/09/2020 0018   BASOSABS 0.0 07/09/2020 0018    CMP     Component Value Date/Time   NA 136 07/12/2020 0240   K 3.5 07/12/2020 0240   CL 98 07/12/2020 0240   CO2 27 07/12/2020 0240   GLUCOSE 97 07/12/2020 0240   BUN 24 (H) 07/12/2020 0240   CREATININE 2.05 (H) 07/12/2020 0240   CALCIUM 8.8 (L) 07/12/2020 0240   PROT 7.4 07/09/2020 0018   ALBUMIN 3.2 (L) 07/09/2020 0018   AST 27 07/09/2020 0018   ALT 27 07/09/2020 0018   ALKPHOS 170 (H) 07/09/2020  0018   BILITOT 0.7 07/09/2020 0018   GFRNONAA 22 (L) 07/12/2020 0240   GFRAA 26 (L) 07/12/2020 0240    Lipid Panel  No results found for: CHOL, TRIG, HDL, CHOLHDL, VLDL, LDLCALC, LDLDIRECT   Imaging I have reviewed the images obtained:  CT-scan of the brain -- No acute abnormality   MRI examination of the brain -- R MCA territory with multiple acute scattered infarcts   Assessment: 84 y.o. female with a past medical history of HTN, HLD, CHF,  prior stroke (on Plavix, no residual deficits per patient), CKD 4, and obesity who presented to the hospital for shortness of breath on 8/25. She then became tachypneic and hypertensive in the 836'O systolic.  Per patient she noticed speech difficulties at this time as well, where she was having trouble getting her words out and had slurred speech.  1. On exam speech is dysarthric and there is some hesitancy with speech output. Otherwise, naming, repetition, fluency, and comprehension are intact. No focal motor or sensory deficits.  2. MRI reveals 3 prominent acute right MCA territory acute ischemic infarctions in addition to a few punctate foci of DWI signal abnormality.  3. Recent TTE on 8/25: Left ventricular ejection fraction, by estimation, is 55 to 60%. The   left ventricle has normal function. The left ventricle has no regional wall motion abnormalities. There is mild concentric left ventricular hypertrophy. Indeterminate diastolic filling due to E-A fusion. The mitral valve is degenerative. Mild mitral valve regurgitation. The aortic valve is tricuspid. Aortic valve regurgitation is not  visualized. Mild aortic valve sclerosis is present, with no evidence of aortic valve stenosis. No mural thrombus mentioned in the report.  4. Carotid ultrasound: Right Carotid: The extracranial vessels were near-normal with only minimal  wall thickening or plaque. Left Carotid: The extracranial vessels were near-normal with only minimal  wall thickening or plaque. Vertebrals: Bilateral vertebral arteries demonstrate antegrade flow. Subclavians: Normal flow hemodynamics were seen in bilateral subclavian arteries.  5. Stroke risk factors: HTN, HLD, CHF,  prior stroke and obesity. 6. Home medications include Plavix and rosuvastatin. Classifiable as having failed ASA monotherapy.   Recommendations: - HgbA1c, fasting lipid panel - Frequent neuro checks - Add ASA to Plavix - Continue Crestor - MRA head - Risk factor modification - Telemetry monitoring - PT consult, OT consult, Speech consult - May need TEE if there is no atrial fibrillation on telemetry and MRA head does not reveal a structural etiology for her strokes.  - Stroke team to follow  Beulah Gandy, DNP   I have seen and examined the patient. I have formulated the assessment and recommendations. 84 y.o. female with a past medical history of HTN, HLD, CHF,  prior stroke (on Plavix, no residual deficits per patient), CKD 4, and obesity who presented to the hospital for shortness of breath on 8/25. She then became tachypneic and hypertensive in the 294'T systolic.  Per patient she noticed speech difficulties at this time as well, where she was having trouble getting her words out and had slurred speech. On  exam, speech is dysarthric and there is some hesitancy with speech output. Otherwise, naming, repetition, fluency, and comprehension are intact. No focal motor or sensory deficits.  No structural etiology for her stroke found on initial studies. Will need MRA head. Monitor telemetry for possible intermittent atrial fibrillation. Add ASA to Plavix. Electronically signed: Dr. Kerney Elbe

## 2020-07-13 ENCOUNTER — Inpatient Hospital Stay (HOSPITAL_COMMUNITY): Payer: Medicare Other

## 2020-07-13 LAB — LIPID PANEL
Cholesterol: 116 mg/dL (ref 0–200)
HDL: 32 mg/dL — ABNORMAL LOW (ref >40)
LDL Cholesterol: 68 mg/dL (ref 0–99)
Total CHOL/HDL Ratio: 3.6 RATIO
Triglycerides: 80 mg/dL (ref <150)
VLDL: 16 mg/dL (ref 0–40)

## 2020-07-13 LAB — BASIC METABOLIC PANEL
Anion gap: 11 (ref 5–15)
BUN: 24 mg/dL — ABNORMAL HIGH (ref 8–23)
CO2: 27 mmol/L (ref 22–32)
Calcium: 8.9 mg/dL (ref 8.9–10.3)
Chloride: 99 mmol/L (ref 98–111)
Creatinine, Ser: 2.02 mg/dL — ABNORMAL HIGH (ref 0.44–1.00)
GFR calc Af Amer: 26 mL/min — ABNORMAL LOW (ref >60)
GFR calc non Af Amer: 22 mL/min — ABNORMAL LOW (ref >60)
Glucose, Bld: 107 mg/dL — ABNORMAL HIGH (ref 70–99)
Potassium: 3.6 mmol/L (ref 3.5–5.1)
Sodium: 137 mmol/L (ref 135–145)

## 2020-07-13 LAB — HEMOGLOBIN A1C
Hgb A1c MFr Bld: 6.3 % — ABNORMAL HIGH (ref 4.8–5.6)
Mean Plasma Glucose: 134.11 mg/dL

## 2020-07-13 MED ORDER — DIPHENHYDRAMINE-ZINC ACETATE 2-0.1 % EX CREA
TOPICAL_CREAM | Freq: Two times a day (BID) | CUTANEOUS | Status: DC | PRN
  Filled 2020-07-13: qty 28

## 2020-07-13 MED ORDER — CALAMINE EX LOTN
TOPICAL_LOTION | CUTANEOUS | Status: DC | PRN
  Filled 2020-07-13: qty 177

## 2020-07-13 NOTE — Progress Notes (Signed)
STROKE TEAM PROGRESS NOTE   HISTORY OF PRESENT ILLNESS (per record) Jillian [name] is a 84 y.o. female with past medical history of HTN, HLD, CHF,  prior stroke (per patient-no residual deficits), CKD 4, and obesity who presented to the hospital for shortness of breath on 8/25. She states that she felt a "sloshing" sound in her chest when she moved and had new onset of orthopnea as well as breathing difficulty as though she was having an attack of asthma. Per chart- EMS noted diminished BS with rales, and she was given a nebulizer treatment, 0.4mg  of nitroglycerin and placed on CPAP with improvement.  She then became tachypneic and hypertensive in the 409'W systolic.  Per patient she noticed speech difficulties at this time as well, where she was having trouble getting her words out and speech was slurred.   CT head with no acute abnormality. Subsequent MRI revealed 3 prominent acute right MCA territory acute ischemic infarctions in addition to a few punctate foci of DWI signal abnormality.  Home medications include Plavix and rosuvastatin.  Patient denies headache, vision abnormalities, numbness/tingling or weakness, CP, SOB, N/V/D, fevers or chills at this time.   LKW: 07/09/2020 tpa given?: No, outside of tPA window  Premorbid modified Rankin scale (mRS): 1-No significant disability at baseline and can perform usual duties    INTERVAL HISTORY Her family is not at bedside. Appears to have some baseline cognitive deficits I tried to discuss stroke and she kept repeating her discomfort about her breasts and rash. Discussed TEE unclear if she understood. No dysarthria, speech appears fluent.    OBJECTIVE Vitals:   07/13/20 0023 07/13/20 0533 07/13/20 0746 07/13/20 1213  BP: (!) 146/58 (!) 164/57 (!) 161/60 116/65  Pulse: 63 67 64 60  Resp: 16 17 18 18   Temp: 98.7 F (37.1 C) 98.6 F (37 C) 98.9 F (37.2 C) 98.5 F (36.9 C)  TempSrc: Oral Oral Oral   SpO2: 100% 99% 94% 92%  Weight: 86.1 kg      Height:        CBC:  Recent Labs  Lab 07/09/20 0018 07/10/20 0359 07/11/20 0753 07/12/20 0240  WBC 12.0*   < > 8.9 7.2  NEUTROABS 9.0*  --   --   --   HGB 10.0*   < > 9.4* 8.9*  HCT 33.2*   < > 30.6* 29.7*  MCV 88.5   < > 87.4 85.8  PLT 244   < > 227 217   < > = values in this interval not displayed.    Basic Metabolic Panel:  Recent Labs  Lab 07/09/20 0018 07/09/20 0046 07/10/20 0359 07/11/20 0753 07/12/20 0240 07/13/20 0459  NA   < >  --  139   < > 136 137  K   < >  --  3.7   < > 3.5 3.6  CL   < >  --  101   < > 98 99  CO2   < >  --  25   < > 27 27  GLUCOSE   < >  --  110*   < > 97 107*  BUN   < >  --  27*   < > 24* 24*  CREATININE   < >  --  2.16*   < > 2.05* 2.02*  CALCIUM   < >  --  8.9   < > 8.8* 8.9  MG  --  2.1 1.8  --   --   --  PHOS  --   --  2.7  --   --   --    < > = values in this interval not displayed.    Lipid Panel:     Component Value Date/Time   CHOL 116 07/13/2020 0459   TRIG 80 07/13/2020 0459   HDL 32 (L) 07/13/2020 0459   CHOLHDL 3.6 07/13/2020 0459   VLDL 16 07/13/2020 0459   LDLCALC 68 07/13/2020 0459   HgbA1c:  Lab Results  Component Value Date   HGBA1C 6.3 (H) 07/13/2020   Urine Drug Screen: No results found for: LABOPIA, COCAINSCRNUR, LABBENZ, AMPHETMU, THCU, LABBARB  Alcohol Level No results found for: ETH  IMAGING  MR ANGIO HEAD WO CONTRAST 07/13/2020 IMPRESSION:  Motion degraded. No proximal intracranial vessel occlusion or definite significant stenosis.   MR BRAIN WO CONTRAST 07/11/2020 IMPRESSION:  1. Numerous acute infarctions scattered within the right hemisphere within the right middle cerebral artery territory. Mild swelling but no hemorrhage or mass effect.  2. Extensive old ischemic changes elsewhere throughout the brain as outlined above.   CT Head WO Contrast 07/09/20 IMPRESSION: 1. No acute intracranial hemorrhage. 2. Moderate age-related atrophy and chronic microvascular ischemic changes. Areas of  old infarct.  VAS US CAROTID 07/13/2020 Summary:  Right Carotid: The extracranial vessels were near-normal with only minimal wall  thickening or plaque.  Left Carotid: The extracranial vessels were near-normal with only minimal wall thickening or plaque.  Vertebrals:  Bilateral vertebral arteries demonstrate antegrade flow.  Subclavians: Normal flow hemodynamics were seen in bilateral subclavian arteries.  Final    Transthoracic Echocardiogram  00/00/2021 Pending   ECG - pending   PHYSICAL EXAM Blood pressure 116/65, pulse 60, temperature 98.5 F (36.9 C), resp. rate 18, height 5\' 7"  (1.702 m), weight 86.1 kg, SpO2 92 %.   Neuro: This is an elderly African-American female in no acute distress, awake and alert, laying in bed, complaining significantly of pain under the breast where she has a rash, patient may have some cognitive issues at baseline, perseveration, no dysarthria or aphasia, naming repetition fluency intact, pupils equally round and reactive to light, blinks to threat in all fields, extraocular movements intact, face is symmetric, face sensation intact, hearing intact, tongue midline, 5 out of 5 strength, tone and bulk normal, intact to light touch, no ataxia or dysmetria noted.    ASSESSMENT/PLAN [name] is a 84 y.o. female with history of HTN, HLD, CHF,  prior stroke (per patient-no residual deficits), CKD 4, and obesity who presented to the hospital for shortness of breath on 8/25. The pt was brought in by EMS - had breathing difficulties en route - received a nebulizer treatment and CPAP then became hypertensive - SBP 210. Shortly thereafter developed speech difficulties.  She did not receive IV t-PA due to late presentation (>4.5 hours from time of onset).  Stroke: Rt MCA territory infarcts - embolic - source unknown.  Code Stroke CT Head - not ordered  CT head -  No acute intracranial hemorrhage. Moderate age-related atrophy and chronic microvascular  ischemic changes. Areas of old infarct.  MRI head - Numerous acute infarctions scattered within the right hemisphere within the right middle cerebral artery territory.  MRA head - Motion degraded. No proximal intracranial vessel occlusion or definite significant stenosis.   CTA H&N - not ordered  CT Perfusion - not ordered  Carotid Doppler - unremarkable  2D Echo - pending  TEE and possibly loop needed  [employer] Virus 2 -  negative  LDL - 68  HgbA1c - 6.3  UDS - not ordered  VTE prophylaxis - [place] Heparine Diet  Diet Order            Diet Heart Room service appropriate? Yes; Fluid consistency: Thin  Diet effective now                 clopidogrel 75 mg daily prior to admission, now on aspirin 325 mg daily and clopidogrel 75 mg daily  Patient counseled to be compliant with her antithrombotic medications  Ongoing aggressive stroke risk factor management  Therapy recommendations:  SNF recommended   Disposition:  Pending  Hypertension  Home BP meds: amlodipine ; apresoline ; cozaar  Current BP meds: amlodipine ; carvedilol  Blood pressure somewhat low at times  . Permissive pertension (OK if < 220/120) but gradually normalize in 5-7 days  . Long-term BP goal normotensive  Hyperlipidemia  Home Lipid lowering medication: Crestor 20 mg daily  LDL 68, goal < 70  Current lipid lowering medication: Crestor 20 mg daily  Continue statin at discharge  Other Stroke Risk Factors  Advanced age  Cigarette smoker - not on file  ETOH use - not on file  Overweight, Body mass index is 29.74 kg/m., recommend weight loss, diet and exercise as appropriate   Hx stroke/TIA  CHF hx  Other Active Problems  Code status - Full code  CHF / Fluid overload - lasix  Anemia - Hgb - 8.9  CKD stage 4 creatinine - 2.02  Hospital day # 4  Embolic strokes, no etiology seen however awaiting echocardiogram results, no large vessel stenoses, will likely need TEE and  possibly loop.  Personally examined patient and images, and have participated in and made any corrections needed to history, physical, neuro exam,assessment and plan as stated above.  I have personally obtained the history, evaluated lab date, reviewed imaging studies and agree with radiology interpretations.    Sarina Ill, MD Stroke Neurology  I spent 35 minutes of face-to-face and non-face-to-face time with patient. This included prechart review, lab review, study review, order entry, electronic health record documentation, patient education on the different diagnostic and therapeutic options, counseling and coordination of care, risks and benefits of management, compliance, or risk factor reduction   To contact Stroke Continuity provider, please refer to [email or web address] After hours, contact General Neurology

## 2020-07-13 NOTE — Evaluation (Signed)
Clinical/Bedside Swallow Evaluation Patient Details  Name: Jillian [name] MRN: [id number] Date of Birth: 03-Aug-1936  Today's Date: 07/13/2020 Time: SLP Start Time (ACUTE ONLY): 1137 SLP Stop Time (ACUTE ONLY): 1152 SLP Time Calculation (min) (ACUTE ONLY): 15 min  Past Medical History:  Past Medical History:  Diagnosis Date  . Hypertension   . Renal disorder    CKD Stage IV  . Stroke [facility])    Past Surgical History: History reviewed. No pertinent surgical history. HPI:   84 y.o. female with medical history significant for hypertension, hyperlipidemia, obesity (BMI 31.32), CKD stage IV, prior CVA presented to the ED 8/25 with complaints of shortness of breath. Dx acute hypoxemic respiratory failure secondary to CHF exacerbation, acute right MCA territory ischemic infarctions. Pt passed [employer] Protocol on 8/26, SLP swallow orders were written 8/28.   Assessment / Plan / Recommendation Clinical Impression  Pt participated in clinical swallow assessment - normal oral mechanism exam, no focal CN deficits.  Self-fed crackers, applesauce, and sequential sips of water from a straw with good oral control, swift swallow, and no s/s of aspiration.  Able to answer questions about history appropriately. No evidence of dysphagia.  Recommend continuing current diet of regular diet, thin liquids; meds whole in liquid.  No SLP f/u warranted. D/W RN.    SLP Visit Diagnosis: Dysphagia, unspecified (R13.10)    Aspiration Risk  No limitations    Diet Recommendation   regular solids, thin liquids  Medication Administration: Whole meds with liquid    Other  Recommendations Oral Care Recommendations: Oral care BID   Follow up Recommendations None      Frequency and Duration            Prognosis        Swallow Study   General Date of Onset: 07/09/20 HPI:  84 y.o. female with medical history significant for hypertension, hyperlipidemia, obesity (BMI 31.32), CKD stage IV, prior CVA presented  to the ED 8/25 with complaints of shortness of breath. Dx acute hypoxemic respiratory failure secondary to CHF exacerbation, acute right MCA territory ischemic infarctions. Pt passed [employer] Protocol on 8/26, SLP swallow orders were written 8/28. Type of Study: Bedside Swallow Evaluation Previous Swallow Assessment: no Diet Prior to this Study: Regular;Thin liquids Temperature Spikes Noted: No Respiratory Status: Room air History of Recent Intubation: No Behavior/Cognition: Alert;Cooperative Oral Cavity Assessment: Within Functional Limits Oral Care Completed by SLP: No Oral Cavity - Dentition: Adequate natural dentition Vision: Functional for self-feeding Self-Feeding Abilities: Able to feed self Patient Positioning: Upright in bed Baseline Vocal Quality: Normal Volitional Cough: Strong Volitional Swallow: Able to elicit    Oral/Motor/Sensory Function Overall Oral Motor/Sensory Function: Within functional limits   Ice Chips Ice chips: Within functional limits   Thin Liquid Thin Liquid: Within functional limits    Nectar Thick Nectar Thick Liquid: Not tested   Honey Thick Honey Thick Liquid: Not tested   Puree Puree: Within functional limits   Solid     Solid: Within functional limits      Juan Quam Laurice 07/13/2020,12:03 PM  Estill Bamberg L. Tivis Ringer, [place] Office number [phone] Pager [phone]

## 2020-07-13 NOTE — Progress Notes (Signed)
Patient complaining of burning and itching under breast and underarms. Provider paged.

## 2020-07-13 NOTE — Plan of Care (Signed)
Per Dr Jaynee Eagles - schedule for TEE and loop tomorrow. Will make pt NPO and notify cardiology.  Mikey Bussing PA-C [facility] Pager [phone] 07/13/2020, 4:10 PM

## 2020-07-13 NOTE — Progress Notes (Addendum)
PROGRESS NOTE  Jillian [name] EUM:[id number] DOB: 03-04-1936 DOA: 07/08/2020 PCP: Patient, No Pcp Per   LOS: 4 days   Brief narrative: As per HPI,  Jillian [name] is a 84 y.o. female with medical history significant of hypertension, hyperlipidemia, obesity (BMI 31.32), CKD stage IV, CVA presented  to the ED with complaints of shortness of breath.  EMS reported diminished breath sounds upon auscultation of the lungs with rales.  She was given 0.4 of nitroglycerin, nebulizer treatment, and started on CPAP.  Improved with these treatments. She has been vaccinated against Covid.   Patient also reports history of MI in 2009 at a hospital in [place], [place].  Unclear if she is currently being seen by a cardiologist.   Denies history of asthma or COPD.  States she tried cigarettes when she was a teenager but denies longstanding history of cigarette smoking. ED Course: Afebrile.  WBC count 12.0.  Hemoglobin 10.0, at baseline per review of prior records under Care Everywhere.  Platelet count normal.  Sodium 138, potassium 3.4, chloride 105, bicarb 24, BUN 30, creatinine 1.9, and glucose 145.  Baseline creatinine 1.7-1.9.  Alkaline phosphatase chronically elevated, remainder of LFTs normal.  Magnesium normal.  BNP 285.  High-sensitivity troponin 72 >93.  EKG showing T wave inversions in inferior and lateral leads, no prior tracing for comparison.  SARS-CoV-2 PCR test negative. Chest x-ray showed cardiomegaly with mild vascular congestion and mild edema. Head CT negative for acute intracranial abnormality. Initially, satting well on room air but later desatted to 89% and became tachypneic with respiratory rate in the 30s.  Blood pressure elevated with systolic up to 540G.  Patient was given IV Lasix 20 mg, DuoNeb treatment, and started on nitroglycerin infusion.  Patient was then admitted to hospital for further evaluation and treatment.  Assessment/Plan:  Principal Problem:   CHF exacerbation  (HCC) Active Problems:   Acute hypoxemic respiratory failure (HCC)   Chest pain   Hypertensive emergency   CKD (chronic kidney disease) stage 4, GFR 15-29 ml/min (HCC)   Acute CVA (cerebrovascular accident) ([place])   Acute hypoxemic respiratory failure likely secondary to acute diastolic CHF exacerbation.  improved at this time.  No documented history of CHF and no prior echo, patient historically was on Lasix in the past.  Chest x-ray showed cardiomegaly with mild vascular congestion and mild edema.  Received supplemental oxygen initially.    2D echocardiogram on 07/10/2019 with LV ejection fraction of 55 to 86% with diastolic dysfunction.. Continue Strict intake and output charting, daily weights and fluid restriction.  COVID-19 was negative.  Cardiology on board.  Has diuresed significantly with a net 6 L negative fluid balance and around 11 pounds weight loss.. On Lasix 40 twice daily with potassium supplements.  Closely monitor renal function.  Chest pain, elevated troponins.    Likely demand ischemia.  Marland Kitchen  EKG with T wave inversions in inferior and lateral leads.  2D echocardiogram performed 07/06/2020 showed LV ejection fraction of 55 to 60%.  No regional wall motion abnormalities.  Diastolic filling impairment noted.    off nitroglycerin drip for hypertensive emergency.  Patient is currently on amlodipine, Coreg Lasix with holding parameters due to acute stroke.  Acute right ischemic MCA territory CVA with history of CVA   MRI no showing multiple acute infarct in the right MCA territory.  Neurology on board.  Patient was on Plavix at home.  Aspirin has been added to the regimen.  Carotid duplex ultrasound with no significant stenosis.  2D echocardiogram with preserved LV function.  Hemoglobin A1c at 6.3.  Lipid profile noted with low HDL at 32 and LDL cholesterol of 68.  Continue neurochecks, PT, OT.  Pending speech therapy evaluation. Physical therapy on board and recommend skilled nursing  facility placement.  Neurology has seen the patient and recommend MRA of the head.  Hypertensive emergency:Blood pressure significantly elevated on presentation with systolic up to 431VQM the ED. Was on nitroglycerin drip due to hypertensive emergency and elevated troponin.  Blood pressure has improved at this time. On oral medications with amlodipine, Coreg, Lasix with holding parameters.  Hold hydralazine for now.  Mild leukocytosis: Reactive.  Has normalized at this time.   Mild hypokalemia: Latest potassium of 3.6.  Hyperlipidemia  Continue Crestor 20 mg a day  CKD stage IV:  Patient's daughter stated that she had CKD stage IV.  Creatinine 1.9, baseline 1.7-1.9. Creatinine of 2.02 today.  DVT prophylaxis:   subcu heparin.  Code Status: Full code  Family Communication:  None today.  I again spoke with the patient's daughter Ms Jillian [name] on the phone yesterday.  Status is: Inpatient  Remains inpatient appropriate because:IV treatments appropriate due to intensity of illness or inability to take PO, Inpatient level of care appropriate due to severity of illness, MRI showing new acute infarcts,  Dispo: The patient is from: Home              Anticipated d/c is to: SNF as per PT recommendation.  Family the patient wishes to go home with home health.  Transition of care consulted, pending stroke work-up.              Anticipated d/c date is: 1-2 days              Patient currently is not medically stable to d/c.  Consultants: Cardiology Neurology   Procedures:  None  Antibiotics:  . None  Anti-infectives (From admission, onward)   None     Subjective:  Today, patient was seen and examined at bedside.  Patient complains of a burning sensation over the chest with some itching.  Denies any shortness of breath, fever, chills or rigor.     Objective: Vitals:   07/13/20 0533 07/13/20 0746  BP: (!) 164/57 (!) 161/60  Pulse: 67 64  Resp: 17 18  Temp: 98.6 F (37  C) 98.9 F (37.2 C)  SpO2: 99% 94%    Intake/Output Summary (Last 24 hours) at 07/13/2020 1059 Last data filed at 07/13/2020 0867 Gross per 24 hour  Intake 600 ml  Output 2000 ml  Net -1400 ml   Filed Weights   07/11/20 0500 07/12/20 0039 07/13/20 0023  Weight: 85.8 kg 85.9 kg 86.1 kg   Body mass index is 29.74 kg/m.   Physical Exam: General: Obese, not in obvious distress, alert awake and oriented HENT:   No scleral pallor or icterus noted. Oral mucosa is moist.  Chest:   Diminished breath sounds bilaterally.  CVS: S1 &S2 heard. No murmur.  Regular rate and rhythm. Abdomen: Soft, nontender, nondistended.  Bowel sounds are heard.   Extremities: No cyanosis, clubbing but trace lower extremity edema noted.  Peripheral pulses are palpable. Psych: Alert, awake and oriented, normal mood CNS: Mild dysarthria.  Power equal in all extremities.   Skin: Warm and dry.  No rashes noted.  Data Review: I have personally reviewed the following laboratory data and studies,  CBC: Recent Labs  Lab 07/09/20 0018 07/10/20 0359 07/11/20 0753 07/12/20 0240  WBC 12.0* 10.1 8.9 7.2  NEUTROABS 9.0*  --   --   --   HGB 10.0* 9.8* 9.4* 8.9*  HCT 33.2* 32.4* 30.6* 29.7*  MCV 88.5 88.3 87.4 85.8  PLT 244 255 227 865   Basic Metabolic Panel: Recent Labs  Lab 07/09/20 0018 07/09/20 0046 07/10/20 0359 07/11/20 0753 07/12/20 0240 07/13/20 0459  NA 138  --  139 137 136 137  K 3.4*  --  3.7 3.7 3.5 3.6  CL 105  --  101 100 98 99  CO2 24  --  25 28 27 27   GLUCOSE 145*  --  110* 92 97 107*  BUN 30*  --  27* 24* 24* 24*  CREATININE 1.95*  --  2.16* 2.04* 2.05* 2.02*  CALCIUM 9.0  --  8.9 8.9 8.8* 8.9  MG  --  2.1 1.8  --   --   --   PHOS  --   --  2.7  --   --   --    Liver Function Tests: Recent Labs  Lab 07/09/20 0018  AST 27  ALT 27  ALKPHOS 170*  BILITOT 0.7  PROT 7.4  ALBUMIN 3.2*   No results for input(s): LIPASE, AMYLASE in the last 168 hours. No results for input(s):  AMMONIA in the last 168 hours. Cardiac Enzymes: No results for input(s): CKTOTAL, CKMB, CKMBINDEX, TROPONINI in the last 168 hours. BNP (last 3 results) Recent Labs    07/09/20 0018  BNP 285.1*    ProBNP (last 3 results) No results for input(s): PROBNP in the last 8760 hours.  CBG: No results for input(s): GLUCAP in the last 168 hours. Recent Results (from the past 240 hour(s))  SARS Coronavirus 2 by RT PCR (hospital order, performed in [facility] hospital lab) Nasopharyngeal Nasopharyngeal Swab     Status: None   Collection Time: 07/09/20 12:53 AM   Specimen: Nasopharyngeal Swab  Result Value Ref Range Status   SARS Coronavirus 2 NEGATIVE NEGATIVE Final    Comment: (NOTE) SARS-CoV-2 target nucleic acids are NOT DETECTED.  The SARS-CoV-2 RNA is generally detectable in upper and lower respiratory specimens during the acute phase of infection. The lowest concentration of SARS-CoV-2 viral copies this assay can detect is 250 copies / mL. A negative result does not preclude SARS-CoV-2 infection and should not be used as the sole basis for treatment or other patient management decisions.  A negative result may occur with improper specimen collection / handling, submission of specimen other than nasopharyngeal swab, presence of viral mutation(s) within the areas targeted by this assay, and inadequate number of viral copies (<250 copies / mL). A negative result must be combined with clinical observations, patient history, and epidemiological information.  Fact Sheet for Patients:   StrictlyIdeas.no  Fact Sheet for Healthcare Providers: BankingDealers.co.za  This test is not yet approved or  cleared by the [place] FDA and has been authorized for detection and/or diagnosis of SARS-CoV-2 by FDA under an Emergency Use Authorization (EUA).  This EUA will remain in effect (meaning this test can be used) for the duration of  the COVID-19 declaration under Section 564(b)(1) of the Act, 21 U.S.C. section 360bbb-3(b)(1), unless the authorization is terminated or revoked sooner.  Performed at [facility], [place] [address], [place], [place] [address]      Studies: MR ANGIO HEAD WO CONTRAST  Result Date: 07/13/2020 CLINICAL DATA:  Stroke, follow-up EXAM: MRA HEAD WITHOUT CONTRAST TECHNIQUE: Angiographic images of the  Circle of Willis were obtained using MRA technique without intravenous contrast. COMPARISON:  None. FINDINGS: Motion artifact is present. Intracranial internal carotid arteries are patent. Middle and anterior cerebral arteries are patent. Intracranial vertebral arteries, basilar artery, posterior cerebral arteries are patent. Bilateral posterior communicating arteries are present. IMPRESSION: Motion degraded. No proximal intracranial vessel occlusion or definite significant stenosis. Electronically Signed   By: Macy Mis M.D.   On: 07/13/2020 09:06   MR BRAIN WO CONTRAST  Result Date: 07/11/2020 CLINICAL DATA:  TIA.  Slurred speech. EXAM: MRI HEAD WITHOUT CONTRAST TECHNIQUE: Multiplanar, multiecho pulse sequences of the brain and surrounding structures were obtained without intravenous contrast. COMPARISON:  Head CT 2 days ago. FINDINGS: Brain: Numerous acute infarctions in the right hemisphere scattered within the right middle cerebral artery territory. This includes the anterior temporal lobe, areas within the insula. The right parietal cortical and subcortical brain, right caudate head and a few scattered punctate infarctions within the white matter. The areas of infarction show mild swelling but there is no evidence of hemorrhage or mass effect. Elsewhere, the brainstem appears normal. Old small vessel infarctions in the inferior right cerebellum. Old small vessel infarctions affect the right lateral thalamus and radiating white matter tracts enter seen throughout the cerebral hemispheric deep and  subcortical white matter. Old left frontoparietal junction cortical infarction. No mass lesion, hydrocephalus or extra-axial collection Vascular: Major vessels at the base of the brain show flow. Skull and upper cervical spine: Negative Sinuses/Orbits: Mucosal inflammatory changes of the left maxillary sinus. Other sinuses clear. Orbits negative. Other: None IMPRESSION: 1. Numerous acute infarctions scattered within the right hemisphere within the right middle cerebral artery territory. Mild swelling but no hemorrhage or mass effect. 2. Extensive old ischemic changes elsewhere throughout the brain as outlined above. Electronically Signed   By: Nelson Chimes M.D.   On: 07/11/2020 14:55   VAS US CAROTID  Result Date: 07/13/2020 Carotid Arterial Duplex Study Indications:       CVA. Risk Factors:      Hypertension, hyperlipidemia. Other Factors:     CHF, CKD IV. Comparison Study:  No prior study Performing Technologist: Sharion Dove RVS  Examination Guidelines: A complete evaluation includes B-mode imaging, spectral Doppler, color Doppler, and power Doppler as needed of all accessible portions of each vessel. Bilateral testing is considered an integral part of a complete examination. Limited examinations for reoccurring indications may be performed as noted.  Right Carotid Findings: +----------+--------+--------+--------+------------------+------------------+           PSV cm/sEDV cm/sStenosisPlaque DescriptionComments           +----------+--------+--------+--------+------------------+------------------+ CCA Prox  101     16                                intimal thickening +----------+--------+--------+--------+------------------+------------------+ CCA Distal89      11                                intimal thickening +----------+--------+--------+--------+------------------+------------------+ ICA Prox  88      21                                                    +----------+--------+--------+--------+------------------+------------------+ ICA Distal96  29                                                   +----------+--------+--------+--------+------------------+------------------+ ECA       116     13                                                   +----------+--------+--------+--------+------------------+------------------+ +----------+--------+-------+--------+-------------------+           PSV cm/sEDV cmsDescribeArm Pressure (mmHG) +----------+--------+-------+--------+-------------------+ [id number]                                        +----------+--------+-------+--------+-------------------+ +---------+--------+--+--------+--+ VertebralPSV cm/s56EDV cm/s18 +---------+--------+--+--------+--+  Left Carotid Findings: +----------+--------+--------+--------+------------------+------------------+           PSV cm/sEDV cm/sStenosisPlaque DescriptionComments           +----------+--------+--------+--------+------------------+------------------+ CCA Prox  106     17                                intimal thickening +----------+--------+--------+--------+------------------+------------------+ CCA Distal127     22                                intimal thickening +----------+--------+--------+--------+------------------+------------------+ ICA Prox  130     33                                                   +----------+--------+--------+--------+------------------+------------------+ ICA Distal84      25                                                   +----------+--------+--------+--------+------------------+------------------+ ECA       181     25                                                   +----------+--------+--------+--------+------------------+------------------+ +----------+--------+--------+--------+-------------------+           PSV cm/sEDV cm/sDescribeArm Pressure  (mmHG) +----------+--------+--------+--------+-------------------+ [id number]                                         +----------+--------+--------+--------+-------------------+ +---------+--------+--+--------+-+ VertebralPSV cm/s34EDV cm/s9 +---------+--------+--+--------+-+   Summary: Right Carotid: The extracranial vessels were near-normal with only minimal wall                thickening or plaque. Left Carotid: The extracranial vessels were near-normal with only minimal wall               thickening or  plaque. Vertebrals:  Bilateral vertebral arteries demonstrate antegrade flow. Subclavians: Normal flow hemodynamics were seen in bilateral subclavian              arteries. *See table(s) above for measurements and observations.  Electronically signed by Servando Snare MD on 07/13/2020 at 9:19:39 AM.    Final       Flora Lipps, MD  [facility] 07/13/2020

## 2020-07-14 DIAGNOSIS — I639 Cerebral infarction, unspecified: Secondary | ICD-10-CM

## 2020-07-14 LAB — CBC
HCT: 31.8 % — ABNORMAL LOW (ref 36.0–46.0)
Hemoglobin: 9.6 g/dL — ABNORMAL LOW (ref 12.0–15.0)
MCH: 26.4 pg (ref 26.0–34.0)
MCHC: 30.2 g/dL (ref 30.0–36.0)
MCV: 87.4 fL (ref 80.0–100.0)
Platelets: 238 10*3/uL (ref 150–400)
RBC: 3.64 MIL/uL — ABNORMAL LOW (ref 3.87–5.11)
RDW: 14.3 % (ref 11.5–15.5)
WBC: 7.9 10*3/uL (ref 4.0–10.5)
nRBC: 0 % (ref 0.0–0.2)

## 2020-07-14 LAB — MAGNESIUM: Magnesium: 1.9 mg/dL (ref 1.7–2.4)

## 2020-07-14 LAB — BASIC METABOLIC PANEL
Anion gap: 10 (ref 5–15)
BUN: 31 mg/dL — ABNORMAL HIGH (ref 8–23)
CO2: 26 mmol/L (ref 22–32)
Calcium: 8.9 mg/dL (ref 8.9–10.3)
Chloride: 102 mmol/L (ref 98–111)
Creatinine, Ser: 2.24 mg/dL — ABNORMAL HIGH (ref 0.44–1.00)
GFR calc Af Amer: 23 mL/min — ABNORMAL LOW (ref >60)
GFR calc non Af Amer: 20 mL/min — ABNORMAL LOW (ref >60)
Glucose, Bld: 104 mg/dL — ABNORMAL HIGH (ref 70–99)
Potassium: 3.8 mmol/L (ref 3.5–5.1)
Sodium: 138 mmol/L (ref 135–145)

## 2020-07-14 MED ORDER — NYSTATIN 100000 UNIT/GM EX POWD
Freq: Three times a day (TID) | CUTANEOUS | Status: DC
  Filled 2020-07-14: qty 15

## 2020-07-14 MED ORDER — DIPHENHYDRAMINE HCL 25 MG PO CAPS: 25.0000 mg | ORAL_CAPSULE | Freq: Three times a day (TID) | ORAL | Status: DC | PRN

## 2020-07-14 NOTE — TOC Transition Note (Signed)
Transition of Care [facility]) - CM/SW Discharge Note   Patient Details  Name: Jillian [name] MRN: [id number] Date of Birth: [birth year]  Transition of Care [facility] CM/SW Contact:  Zenon Mayo, RN Phone Number: 07/14/2020, 4:34 PM   Clinical Narrative:    Per previous note from [place], there is no preference for [facility], NCM made referral to [facility] with [facility] for [facility], [place].  NCM made referral to [facility] for 3 n 1 and rollator, she will bring up to room tomrrow. Hoyle Sauer is able to take referral, soc will begin 24 to 48 hrs post dc.    Final next level of care: [place] Barriers to Discharge: Continued Medical Work up   Patient Goals and CMS Choice Patient states their goals for this hospitalization and ongoing recovery are:: Rehab at home CMS Medicare.gov Compare Post Acute Care list provided to:: Patient Represenative (must comment) (Granddaughter, Ozella Almond) Choice offered to / list presented to :  Loss adjuster, chartered)  Discharge Placement                       Discharge Plan and Services In-house Referral: Clinical Social Work Discharge Planning Services: CM Consult Post Acute Care Choice: Home Health          DME Arranged: Walker rolling with seat, 3-N-1 DME Agency: [employer] Date DME Agency Contacted: 07/14/20 Time DME Agency Contacted: [phone] Representative spoke with at DME Agency: [place]: RN, PT [place] Agency:  ([place]) Date [place]: 07/14/20 Time [place]: [phone] Representative spoke with at [place]: [place] ([place]) Interventions     Readmission Risk Interventions No flowsheet data found.

## 2020-07-14 NOTE — Progress Notes (Signed)
STROKE TEAM PROGRESS NOTE   INTERVAL HISTORY Patient is sitting up in bed.  Appears comfortable.  Speech is hesitant and has diminished verbal output.  She is appears to follow simple midline and one-step commands.  Loop recorder scheduled for later today.  We will cancel TEE given her advanced age is going to be of low yield.  Transthoracic echo shows ejection fraction 55 to 60% without cardiac source of embolism.  Carotid ultrasound shows no significant extracranial stenosis.  Telemetry monitoring so far has not shown any paroxysmal A. fib OBJECTIVE Vitals:   07/14/20 0002 07/14/20 0007 07/14/20 0406 07/14/20 0745  BP: (!) 161/69  133/61 (!) 136/40  Pulse: 69  63 60  Resp: _0 Temp: 97.8 F (36.6 C)  98.6 F (37 C) 98.2 F (36.8 C)  TempSrc: Oral  Oral Oral  SpO2: 98%  100% 96%  Weight:  84.9 kg    Height:        CBC:  Recent Labs  Lab 07/09/20 0018 07/10/20 0359 07/12/20 0240 07/14/20 0509  WBC 12.0*   < > 7.2 7.9  NEUTROABS 9.0*  --   --   --   HGB 10.0*   < > 8.9* 9.6*  HCT 33.2*   < > 29.7* 31.8*  MCV 88.5   < > 85.8 87.4  PLT 244   < > 217 238   < > = values in this interval not displayed.    Basic Metabolic Panel:  Recent Labs  Lab 07/10/20 0359 07/11/20 0753 07/13/20 0459 07/14/20 0509  NA 139   < > 137 138  K 3.7   < > 3.6 3.8  CL 101   < > 99 102  CO2 25   < > 27 26  GLUCOSE 110*   < > 107* 104*  BUN 27*   < > 24* 31*  CREATININE 2.16*   < > 2.02* 2.24*  CALCIUM 8.9   < > 8.9 8.9  MG 1.8  --   --  1.9  PHOS 2.7  --   --   --    < > = values in this interval not displayed.    Lipid Panel:     Component Value Date/Time   CHOL 116 07/13/2020 0459   TRIG 80 07/13/2020 0459   HDL 32 (L) 07/13/2020 0459   CHOLHDL 3.6 07/13/2020 0459   VLDL 16 07/13/2020 0459   LDLCALC 68 07/13/2020 0459   HgbA1c:  Lab Results  Component Value Date   HGBA1C 6.3 (H) 07/13/2020   Urine Drug Screen: No results found for: LABOPIA, COCAINSCRNUR, LABBENZ,  AMPHETMU, THCU, LABBARB  Alcohol Level No results found for: ETH  IMAGING  MR ANGIO HEAD WO CONTRAST 07/13/2020 IMPRESSION:  Motion degraded. No proximal intracranial vessel occlusion or definite significant stenosis.   MR BRAIN WO CONTRAST 07/11/2020 IMPRESSION:  1. Numerous acute infarctions scattered within the right hemisphere within the right middle cerebral artery territory. Mild swelling but no hemorrhage or mass effect.  2. Extensive old ischemic changes elsewhere throughout the brain as outlined above.   CT Head WO Contrast 07/09/20 IMPRESSION: 1. No acute intracranial hemorrhage. 2. Moderate age-related atrophy and chronic microvascular ischemic changes. Areas of old infarct.  VAS US CAROTID 07/13/2020 Summary:  Right Carotid: The extracranial vessels were near-normal with only minimal wall  thickening or plaque.  Left Carotid: The extracranial vessels were near-normal with only minimal wall thickening or plaque.  Vertebrals:  Bilateral vertebral  arteries demonstrate antegrade flow.  Subclavians: Normal flow hemodynamics were seen in bilateral subclavian arteries.  Final    Transthoracic Echocardiogram  Ejection fraction 55 to 60%.  No cardiac source embolism.  ECG -sinus rhythm..  Probable LVH.  Borderline prolonged QT interval.   PHYSICAL EXAM   Blood pressure (!) 136/40, pulse 60, temperature 98.2 F (36.8 C), temperature source Oral, resp. rate 16, height _0  (1.702 m), weight 84.9 kg, SpO2 96 %. Neurological Exam ;  : Frail elderly African-American female in no acute distress,   . Afebrile. Head is nontraumatic. Neck is supple without bruit.    Cardiac exam no murmur or gallop. Lungs are clear to auscultation. Distal pulses are well felt.  Neurological Exam ;  Awake  Alert hesitant and slightly nonfluent speech and language.mild dysarthria.  Follows simple midline and 1 and two-step commands.  Eye movements full without nystagmus.fundi were not visualized.  Vision acuity and fields appear normal. Hearing is normal. Palatal movements are normal. Face symmetric. Tongue midline. Normal strength, tone, reflexes and coordination. Normal sensation. Gait deferred. ASSESSMENT/PLAN Jillian [name] is a 84 y.o. female with history of HTN, HLD, CHF,  prior stroke (per patient-no residual deficits), CKD 4, and obesity who presented to the hospital for shortness of breath on 8/25. The pt was brought in by EMS - had breathing difficulties en route - received a nebulizer treatment and CPAP then became hypertensive - SBP 210. Shortly thereafter developed speech difficulties.  She did not receive IV t-PA due to late presentation (>4.5 hours from time of onset).  Stroke: Rt MCA territory infarcts - embolic - source unknown.  Code Stroke CT Head - not ordered  CT head -  No acute intracranial hemorrhage. Moderate age-related atrophy and chronic microvascular ischemic changes. Areas of old infarct.  MRI head - Numerous acute infarctions scattered within the right hemisphere within the right middle cerebral artery territory.  MRA head - Motion degraded. No proximal intracranial vessel occlusion or definite significant stenosis.   Carotid Doppler - unremarkable  2D Echo -normal ejection fraction 55 to 60%.  No cardiac source embolism.    Will not pursue TEE in this 84yo female given low yield and test risk  Recommend loop to look for AF as source of stroke. EP has already met with she and her daughter which decline loop at this time. They will follow up w ith Dr. Harrington Challenger in 2 weeks and can discuss further. She also has a chest skin infection, ? Yeast    [employer] Virus 2 - negative  LDL - 68  HgbA1c - 6.3  UDS - not ordered  VTE prophylaxis - [place] Heparine  clopidogrel 75 mg daily prior to admission, now on aspirin 325 mg daily and clopidogrel 75 mg daily/ change to asprin and brilinta **  Patient counseled to be compliant with her antithrombotic  medications  Ongoing aggressive stroke risk factor management  Therapy recommendations:  SNF recommended   Disposition:  Pending  Hypertension  Home BP meds: amlodipine ; apresoline ; cozaar  Current BP meds: amlodipine ; carvedilol  Blood pressure somewhat low at times  . Permissive pertension (OK if < 220/120) but gradually normalize in 5-7 days  . Long-term BP goal normotensive  Hyperlipidemia  Home Lipid lowering medication: Crestor 20 mg daily  LDL 68, goal < 70  Current lipid lowering medication: Crestor 20 mg daily  Continue statin at discharge  Other Stroke Risk Factors  Advanced age  Cigarette smoker -  not on file  ETOH use - not on file  Overweight, Body mass index is 29.3 kg/m., recommend weight loss, diet and exercise as appropriate   Hx stroke/TIA  CHF hx  Other Active Problems  Code status - Full code  CHF / Fluid overload - lasix  Anemia - Hgb - 8.9  CKD stage 4 creatinine - 2.02  Hospital day # 5 She has presented with embolic right MCA infarcts likely of cryptogenic etiology.  2D echo is unremarkable.  Will not do TEE given her advanced age will be low yield.  Continue ongoing cardiac monitoring and recommend loop recorder insertion upon discharge to look for paroxysmal A. fib.  Dr. Louanne Belton to evaluate chest pain and rash.  Discussed with Dr. Louanne Belton  I spent 25 minutes of face-to-face and non-face-to-face time with patient. This included prechart review, lab review, study review, order entry, electronic health record documentation, patient education on the different diagnostic and therapeutic options, counseling and coordination of care, risks and benefits of management, compliance, or risk factor reduction Antony Contras, MD  To contact Stroke Continuity provider, please refer to [email or web address] After hours, contact General Neurology

## 2020-07-14 NOTE — Progress Notes (Signed)
PROGRESS NOTE  Jillian [name] IDP:[id number] DOB: Sep 11, 1936 DOA: 07/08/2020 PCP: Patient, No Pcp Per   LOS: 5 days   Brief narrative: As per HPI,  Jillian [name] is a 84 y.o. female with medical history significant of hypertension, hyperlipidemia, obesity (BMI 31.32), CKD stage IV, CVA presented  to the ED with complaints of shortness of breath.  EMS reported diminished breath sounds upon auscultation of the lungs with rales.  She was given 0.4 of nitroglycerin, nebulizer treatment, and started on CPAP.  Improved with these treatments. She has been vaccinated against Covid.   Patient also reports history of MI in 2009 at a hospital in [place], [place].  Unclear if she is currently being seen by a cardiologist.   Denies history of asthma or COPD.  States she tried cigarettes when she was a teenager but denies longstanding history of cigarette smoking. ED Course: Afebrile.  WBC count 12.0.  Hemoglobin 10.0, at baseline per review of prior records under Care Everywhere.  Platelet count normal.  Sodium 138, potassium 3.4, chloride 105, bicarb 24, BUN 30, creatinine 1.9, and glucose 145.  Baseline creatinine 1.7-1.9.  Alkaline phosphatase chronically elevated, remainder of LFTs normal.  Magnesium normal.  BNP 285.  High-sensitivity troponin 72 >93.  EKG showing T wave inversions in inferior and lateral leads, no prior tracing for comparison.  SARS-CoV-2 PCR test negative. Chest x-ray showed cardiomegaly with mild vascular congestion and mild edema. Head CT negative for acute intracranial abnormality. Initially, satting well on room air but later desatted to 89% and became tachypneic with respiratory rate in the 30s.  Blood pressure elevated with systolic up to 443X.  Patient was given IV Lasix 20 mg, DuoNeb treatment, and started on nitroglycerin infusion.  Patient was then admitted to hospital for further evaluation and treatment.  Patient reports some dysarthria during hospitalization and MRI was  performed which showed stroke.  Neurology was consulted.  Assessment/Plan:  Principal Problem:   CHF exacerbation (HCC) Active Problems:   Acute hypoxemic respiratory failure (HCC)   Chest pain   Hypertensive emergency   CKD (chronic kidney disease) stage 4, GFR 15-29 ml/min (HCC)   Acute CVA (cerebrovascular accident) ([place])   Acute hypoxemic respiratory failure likely secondary to acute diastolic CHF exacerbation.  improved at this time.     2D echocardiogram on 07/10/2019 with LV ejection fraction of 55 to 54% with diastolic dysfunction.. Continue Strict intake and output charting, daily weights and fluid restriction.  Cardiology on board. On oral Lasix 40 twice daily with potassium supplements.  Closely monitor renal function, at 2.2.   Acute right ischemic MCA territory CVA with history of CVA   MRI of the brain showing multiple acute infarct in the right MCA territory.  Neurology on board.  Continue aspirin Plavix.  Carotid duplex ultrasound with no significant stenosis.  2D echocardiogram with preserved LV function.  Hemoglobin A1c at 6.3.  Lipid profile noted with low HDL at 32 and LDL cholesterol of 68.  Continue neurochecks, PT, OT.   Physical therapy on board and recommend skilled nursing facility placement.  MRA of the brain did not show any intracranial vessel occlusion or definite stenosis though it was a motion degraded study.  Neurology recommends a loop recorder and TEE but patient has refused  loop recorder placement at this time.Marland Kitchen  Spoke with the patient's daughter about it.  Chest pain, elevated troponins.    Likely demand ischemia.  EKG with T wave inversions in inferior and lateral leads.  2D echocardiogram performed 07/06/2020 showed LV ejection fraction of 55 to 60%.  No regional wall motion abnormalities.  Diastolic filling impairment noted.    Was initially on nitroglycerin drip for hypertensive emergency.  Patient is currently on amlodipine, Coreg, Lasix with holding  parameters due to acute stroke.   Hypertensive emergency:Blood pressure significantly elevated on presentation with systolic up to 789FYB the ED. Was on nitroglycerin drip due to hypertensive emergency and elevated troponin.  Blood pressure has improved at this time. On oral medications with amlodipine, Coreg, Lasix with holding parameters.  Hold hydralazine for now.  Latest blood pressure of 137/ 60  Mild leukocytosis: Reactive.  Has normalized at this time.  WBC of 7.9   Mild hypokalemia: Latest potassium of 3.8.  Hyperlipidemia  Continue Crestor daily  CKD stage IV:  Creatinine 2.2, baseline 1.7-1.9.  Watch closely on diuretics  DVT prophylaxis:   subcu heparin.  Code Status: Full code  Family Communication:  I spoke with the patient's daughter Ms Jillian [name] on the phone and updated her about the clinical condition of the patient.  Status is: Inpatient  Remains inpatient appropriate because:IV treatments appropriate due to intensity of illness or inability to take PO, Inpatient level of care appropriate due to severity of illness, MRI showing new acute infarcts,  Dispo: The patient is from: Home              Anticipated d/c is to: SNF as per PT recommendation, patient and the daughter wishes to go home with home health, follow neurology recommendations              Anticipated d/c date is: Likely tomorrow for the patient remained stable.  Arrange for home health on discharge.              Patient currently is not medically stable to d/c.  Consultants: Cardiology Neurology  Electrophysiology cardiology  Procedures:  None  Antibiotics:  . None  Anti-infectives (From admission, onward)   None     Subjective:  Today, patient was seen and examined at bedside.  Patient still complains of some itching over the chest with some pain.  No shortness of breath or fever chills.   Objective: Vitals:   07/14/20 0745 07/14/20 1301  BP: (!) 136/40 137/60  Pulse: 60 61    Resp: 16 17  Temp: 98.2 F (36.8 C) 98.1 F (36.7 C)  SpO2: 96% 97%    Intake/Output Summary (Last 24 hours) at 07/14/2020 1327 Last data filed at 07/14/2020 1323 Gross per 24 hour  Intake 280 ml  Output 2375 ml  Net -2095 ml   Filed Weights   07/12/20 0039 07/13/20 0023 07/14/20 0007  Weight: 85.9 kg 86.1 kg 84.9 kg   Body mass index is 29.3 kg/m.   Physical Exam: General: Obese, not in obvious distress, alert awake and oriented HENT:   No scleral pallor or icterus noted. Oral mucosa is moist.  Chest:   Diminished breath sounds bilaterally.  Whitish-cream over the submammary regions. CVS: S1 &S2 heard. No murmur.  Regular rate and rhythm. Abdomen: Soft, nontender, nondistended.  Bowel sounds are heard.   Extremities: No cyanosis, clubbing but trace lower extremity edema noted.  Peripheral pulses are palpable. Psych: Alert, awake and oriented, normal mood CNS: Mild dysarthria.  Power equal in all extremities.   Skin: Warm and dry.  No rashes noted.  Data Review: I have personally reviewed the following laboratory data and studies,  CBC: Recent Labs  Lab 07/09/20  0018 07/10/20 0359 07/11/20 0753 07/12/20 0240 07/14/20 0509  WBC 12.0* 10.1 8.9 7.2 7.9  NEUTROABS 9.0*  --   --   --   --   HGB 10.0* 9.8* 9.4* 8.9* 9.6*  HCT 33.2* 32.4* 30.6* 29.7* 31.8*  MCV 88.5 88.3 87.4 85.8 87.4  PLT 244 255 227 217 867   Basic Metabolic Panel: Recent Labs  Lab 07/09/20 0018 07/09/20 0046 07/10/20 0359 07/11/20 0753 07/12/20 0240 07/13/20 0459 07/14/20 0509  NA   < >  --  139 137 136 137 138  K   < >  --  3.7 3.7 3.5 3.6 3.8  CL   < >  --  101 100 98 99 102  CO2   < >  --  25 28 27 27 26   GLUCOSE   < >  --  110* 92 97 107* 104*  BUN   < >  --  27* 24* 24* 24* 31*  CREATININE   < >  --  2.16* 2.04* 2.05* 2.02* 2.24*  CALCIUM   < >  --  8.9 8.9 8.8* 8.9 8.9  MG  --  2.1 1.8  --   --   --  1.9  PHOS  --   --  2.7  --   --   --   --    < > = values in this interval not  displayed.   Liver Function Tests: Recent Labs  Lab 07/09/20 0018  AST 27  ALT 27  ALKPHOS 170*  BILITOT 0.7  PROT 7.4  ALBUMIN 3.2*   No results for input(s): LIPASE, AMYLASE in the last 168 hours. No results for input(s): AMMONIA in the last 168 hours. Cardiac Enzymes: No results for input(s): CKTOTAL, CKMB, CKMBINDEX, TROPONINI in the last 168 hours. BNP (last 3 results) Recent Labs    07/09/20 0018  BNP 285.1*    ProBNP (last 3 results) No results for input(s): PROBNP in the last 8760 hours.  CBG: No results for input(s): GLUCAP in the last 168 hours. Recent Results (from the past 240 hour(s))  SARS Coronavirus 2 by RT PCR (hospital order, performed in [facility] hospital lab) Nasopharyngeal Nasopharyngeal Swab     Status: None   Collection Time: 07/09/20 12:53 AM   Specimen: Nasopharyngeal Swab  Result Value Ref Range Status   SARS Coronavirus 2 NEGATIVE NEGATIVE Final    Comment: (NOTE) SARS-CoV-2 target nucleic acids are NOT DETECTED.  The SARS-CoV-2 RNA is generally detectable in upper and lower respiratory specimens during the acute phase of infection. The lowest concentration of SARS-CoV-2 viral copies this assay can detect is 250 copies / mL. A negative result does not preclude SARS-CoV-2 infection and should not be used as the sole basis for treatment or other patient management decisions.  A negative result may occur with improper specimen collection / handling, submission of specimen other than nasopharyngeal swab, presence of viral mutation(s) within the areas targeted by this assay, and inadequate number of viral copies (<250 copies / mL). A negative result must be combined with clinical observations, patient history, and epidemiological information.  Fact Sheet for Patients:   StrictlyIdeas.no  Fact Sheet for Healthcare Providers: BankingDealers.co.za  This test is not yet approved or  cleared by  the [place] FDA and has been authorized for detection and/or diagnosis of SARS-CoV-2 by FDA under an Emergency Use Authorization (EUA).  This EUA will remain in effect (meaning this test can be used) for the  duration of the COVID-19 declaration under Section 564(b)(1) of the Act, 21 U.S.C. section 360bbb-3(b)(1), unless the authorization is terminated or revoked sooner.  Performed at [facility], [place] [address], [place], [place] [address]      Studies: MR ANGIO HEAD WO CONTRAST  Result Date: 07/13/2020 CLINICAL DATA:  Stroke, follow-up EXAM: MRA HEAD WITHOUT CONTRAST TECHNIQUE: Angiographic images of the Circle of Willis were obtained using MRA technique without intravenous contrast. COMPARISON:  None. FINDINGS: Motion artifact is present. Intracranial internal carotid arteries are patent. Middle and anterior cerebral arteries are patent. Intracranial vertebral arteries, basilar artery, posterior cerebral arteries are patent. Bilateral posterior communicating arteries are present. IMPRESSION: Motion degraded. No proximal intracranial vessel occlusion or definite significant stenosis. Electronically Signed   By: Macy Mis M.D.   On: 07/13/2020 09:06   VAS US CAROTID  Result Date: 07/13/2020 Carotid Arterial Duplex Study Indications:       CVA. Risk Factors:      Hypertension, hyperlipidemia. Other Factors:     CHF, CKD IV. Comparison Study:  No prior study Performing Technologist: Sharion Dove RVS  Examination Guidelines: A complete evaluation includes B-mode imaging, spectral Doppler, color Doppler, and power Doppler as needed of all accessible portions of each vessel. Bilateral testing is considered an integral part of a complete examination. Limited examinations for reoccurring indications may be performed as noted.  Right Carotid Findings: +----------+--------+--------+--------+------------------+------------------+           PSV cm/sEDV cm/sStenosisPlaque  DescriptionComments           +----------+--------+--------+--------+------------------+------------------+ CCA Prox  101     16                                intimal thickening +----------+--------+--------+--------+------------------+------------------+ CCA Distal89      11                                intimal thickening +----------+--------+--------+--------+------------------+------------------+ ICA Prox  88      21                                                   +----------+--------+--------+--------+------------------+------------------+ ICA Distal96      29                                                   +----------+--------+--------+--------+------------------+------------------+ ECA       116     13                                                   +----------+--------+--------+--------+------------------+------------------+ +----------+--------+-------+--------+-------------------+           PSV cm/sEDV cmsDescribeArm Pressure (mmHG) +----------+--------+-------+--------+-------------------+ [id number]                                        +----------+--------+-------+--------+-------------------+ +---------+--------+--+--------+--+ VertebralPSV cm/s56EDV cm/s18 +---------+--------+--+--------+--+  Left Carotid Findings: +----------+--------+--------+--------+------------------+------------------+           PSV cm/sEDV cm/sStenosisPlaque DescriptionComments           +----------+--------+--------+--------+------------------+------------------+ CCA Prox  106     17                                intimal thickening +----------+--------+--------+--------+------------------+------------------+ CCA Distal127     22                                intimal thickening +----------+--------+--------+--------+------------------+------------------+ ICA Prox  130     33                                                    +----------+--------+--------+--------+------------------+------------------+ ICA Distal84      25                                                   +----------+--------+--------+--------+------------------+------------------+ ECA       181     25                                                   +----------+--------+--------+--------+------------------+------------------+ +----------+--------+--------+--------+-------------------+           PSV cm/sEDV cm/sDescribeArm Pressure (mmHG) +----------+--------+--------+--------+-------------------+ [id number]                                         +----------+--------+--------+--------+-------------------+ +---------+--------+--+--------+-+ VertebralPSV cm/s34EDV cm/s9 +---------+--------+--+--------+-+   Summary: Right Carotid: The extracranial vessels were near-normal with only minimal wall                thickening or plaque. Left Carotid: The extracranial vessels were near-normal with only minimal wall               thickening or plaque. Vertebrals:  Bilateral vertebral arteries demonstrate antegrade flow. Subclavians: Normal flow hemodynamics were seen in bilateral subclavian              arteries. *See table(s) above for measurements and observations.  Electronically signed by Servando Snare MD on 07/13/2020 at 9:19:39 AM.    Final       Flora Lipps, MD  [facility] 07/14/2020

## 2020-07-14 NOTE — Plan of Care (Signed)
  Problem: Education: Goal: Ability to demonstrate management of disease process will improve 07/14/2020 1043 by Arsenio Katz, RN Outcome: Progressing 07/14/2020 1043 by Arsenio Katz, RN Outcome: Progressing Goal: Ability to verbalize understanding of medication therapies will improve 07/14/2020 1043 by Arsenio Katz, RN Outcome: Progressing 07/14/2020 1043 by Arsenio Katz, RN Outcome: Progressing Goal: Individualized Educational Video(s) 07/14/2020 1043 by Arsenio Katz, RN Outcome: Progressing 07/14/2020 1043 by Arsenio Katz, RN Outcome: Progressing   Problem: Activity: Goal: Capacity to carry out activities will improve 07/14/2020 1043 by Arsenio Katz, RN Outcome: Progressing 07/14/2020 1043 by Arsenio Katz, RN Outcome: Progressing   Problem: Cardiac: Goal: Ability to achieve and maintain adequate cardiopulmonary perfusion will improve 07/14/2020 1043 by Arsenio Katz, RN Outcome: Progressing 07/14/2020 1043 by Arsenio Katz, RN Outcome: Progressing   Problem: Education: Goal: Knowledge of disease or condition will improve 07/14/2020 1043 by Arsenio Katz, RN Outcome: Progressing 07/14/2020 1043 by Arsenio Katz, RN Outcome: Progressing Goal: Knowledge of secondary prevention will improve 07/14/2020 1043 by Arsenio Katz, RN Outcome: Progressing 07/14/2020 1043 by Arsenio Katz, RN Outcome: Progressing Goal: Knowledge of patient specific risk factors addressed and post discharge goals established will improve 07/14/2020 1043 by Arsenio Katz, RN Outcome: Progressing 07/14/2020 1043 by Arsenio Katz, RN Outcome: Progressing   Problem: Self-Care: Goal: Ability to participate in self-care as condition permits will improve 07/14/2020 1043 by Arsenio Katz, RN Outcome: Progressing 07/14/2020 1043 by Arsenio Katz, RN Outcome: Progressing Goal: Verbalization of feelings and concerns over difficulty with self-care will improve 07/14/2020 1043  by Arsenio Katz, RN Outcome: Progressing 07/14/2020 1043 by Arsenio Katz, RN Outcome: Progressing   Problem: Nutrition: Goal: Risk of aspiration will decrease 07/14/2020 1043 by Arsenio Katz, RN Outcome: Progressing 07/14/2020 1043 by Arsenio Katz, RN Outcome: Progressing

## 2020-07-14 NOTE — Consult Note (Addendum)
ELECTROPHYSIOLOGY CONSULT NOTE  Patient ID: Jillian [name] MRN: [id number], DOB/AGE: 08-13-1936   Admit date: 07/08/2020 Date of Consult: 07/14/2020  Primary Physician: Patient, No Pcp Per Primary Cardiologist: new to [facility], Dr. Harrington Challenger Reason for Consultation: Cryptogenic stroke ; recommendations regarding Implantable Loop Recorder, requested by Dr. Leonie Man  History of Present Illness Jillian [name] was admitted on 07/08/2020 with acute onset SOB and CP, difficult speech Abn Trop prompted cardiology consult, neurology as well.  PMHx includes CKD (IV), HTN, stroke, obesity, CAD (unclear extent).  His CP/SOB and Trop felt 2/2 demand in setting of CHF, symptoms improved with diuresis/meds.  Transitioned to PO diuretics and planned for outpt cardiology follow up, cards sign off 07/11/2020 Neurology also consulted noted numerous infartcs HTN urgency was also a clinic problem  Neurology noted:Rt MCA territory infarcts - embolic - source unknown .  she has undergone workup for stroke including echocardiogram and carotid dopplers.  The patient has been monitored on telemetry which has demonstrated sinus rhythm, early in her stay a NSVT episode of 7 beats, and she has had a brief PAT as well, no AFib.    Neurology in d/w neurology NP, has deferred TEE given advanced age   Echocardiogram this admission demonstrated  Echo 07/09/20 1. Left ventricular ejection fraction, by estimation, is 55 to 60%. The  left ventricle has normal function. The left ventricle has no regional  wall motion abnormalities. There is mild concentric left ventricular  hypertrophy. Indeterminate diastolic  filling due to E-A fusion.   2. Right ventricular systolic function is normal. The right ventricular  size is normal. Tricuspid regurgitation signal is inadequate for assessing  PA pressure.   3. The mitral valve is degenerative. Mild mitral valve regurgitation.   4. The aortic valve is tricuspid. Aortic valve regurgitation  is not  visualized. Mild aortic valve sclerosis is present, with no evidence of  aortic valve stenosis.   5. The inferior vena cava is normal in size with greater than 50%  respiratory variability, suggesting right atrial pressure of 3 mmHg.      Lab work is reviewed.   Prior to admission, the patient denies chest pain, SOB is resolved, no dizziness, palpitations, or syncope.  They are recovering with plans to SNF vs home w/HHC at discharge, looks today vs tomorrow by notes.   Past Medical History:  Diagnosis Date   Hypertension    Renal disorder    CKD Stage IV   Stroke [facility])      Surgical History: History reviewed. No pertinent surgical history.   Medications Prior to Admission  Medication Sig Dispense Refill Last Dose   amLODipine (NORVASC) 5 MG tablet Take 5 mg by mouth daily.   07/08/2020 at Unknown time   clopidogrel (PLAVIX) 75 MG tablet Take 75 mg by mouth daily.   07/08/2020 at Unknown time   hydrALAZINE (APRESOLINE) 50 MG tablet Take 50 mg by mouth 3 (three) times daily.   07/08/2020 at Unknown time   losartan (COZAAR) 100 MG tablet Take 100 mg by mouth daily.   07/08/2020 at Unknown time   rosuvastatin (CRESTOR) 20 MG tablet Take 20 mg by mouth daily.   07/08/2020 at Unknown time    Inpatient Medications:   amLODipine  5 mg Oral Daily   aspirin  325 mg Oral Daily   carvedilol  6.25 mg Oral BID WC   clopidogrel  75 mg Oral Daily   furosemide  40 mg Oral BID   heparin injection (subcutaneous)  5,000 Units Subcutaneous Q8H   potassium chloride  10 mEq Oral Daily   rosuvastatin  20 mg Oral q1800   sodium chloride flush  3 mL Intravenous Q12H    Allergies:  Allergies  Allergen Reactions   Penicillins Nausea Only    Social History   Socioeconomic History   Marital status: Unknown    Spouse name: Not on file   Number of children: Not on file   Years of education: Not on file   Highest education level: Not on file  Occupational History   Not on file  Tobacco  Use   Smoking status: Not on file  Substance and Sexual Activity   Alcohol use: Not on file   Drug use: Not on file   Sexual activity: Not on file  Other Topics Concern   Not on file  Social History Narrative   Not on file   Social Determinants of Health   Financial Resource Strain:    Difficulty of Paying Living Expenses: Not on file  Food Insecurity:    Worried About [place] in the Last Year: Not on file   Ran Out of Food in the Last Year: Not on file  Transportation Needs:    Lack of Transportation (Medical): Not on file   Lack of Transportation (Non-Medical): Not on file  Physical Activity:    Days of Exercise per Week: Not on file   Minutes of Exercise per Session: Not on file  Stress:    Feeling of Stress : Not on file  Social Connections:    Frequency of Communication with Friends and Family: Not on file   Frequency of Social Gatherings with Friends and Family: Not on file   Attends Religious Services: Not on file   Active Member of Clubs or Organizations: Not on file   Attends Archivist Meetings: Not on file   Marital Status: Not on file  Intimate Partner Violence:    Fear of Current or Ex-Partner: Not on file   Emotionally Abused: Not on file   Physically Abused: Not on file   Sexually Abused: Not on file     History reviewed. She is a poor historian Daughter at bedside calls the patient mother had cancer though can not recall specifics    Review of Systems: All other systems reviewed and are otherwise negative except as noted above.  Physical Exam: Vitals:   07/14/20 0002 07/14/20 0007 07/14/20 0406 07/14/20 0745  BP: (!) 161/69  133/61 (!) 136/40  Pulse: 69  63 60  Resp: 18  15 16   Temp: 97.8 F (36.6 C)  98.6 F (37 C) 98.2 F (36.8 C)  TempSrc: Oral  Oral Oral  SpO2: 98%  100% 96%  Weight:  84.9 kg    Height:        GEN- The patient is well appearing, alert and oriented x 3 today.   Head- normocephalic,  atraumatic Eyes-  Sclera clear, conjunctiva pink Ears- hearing intact Oropharynx- clear Neck- supple Lungs- CTA b/l, normal work of breathing Heart- RRR, no murmurs, rubs or gallops  GI- soft, NT, ND, + BS Extremities- no clubbing, cyanosis, or edema MS- no significant deformity or atrophy Skin- no rash or lesion, she has some built up powder underneath b/l breasts, no obvious skin breakdown, drainage, though difficult to evaluate (deferred to medicine team) Psych- euthymic mood, full affect   Labs:   Lab Results  Component Value Date   WBC 7.9 07/14/2020  HGB 9.6 (L) 07/14/2020   HCT 31.8 (L) 07/14/2020   MCV 87.4 07/14/2020   PLT 238 07/14/2020    Recent Labs  Lab 07/09/20 0018 07/10/20 0359 07/14/20 0509  NA 138   < > 138  K 3.4*   < > 3.8  CL 105   < > 102  CO2 24   < > 26  BUN 30*   < > 31*  CREATININE 1.95*   < > 2.24*  CALCIUM 9.0   < > 8.9  PROT 7.4  --   --   BILITOT 0.7  --   --   ALKPHOS 170*  --   --   ALT 27  --   --   AST 27  --   --   GLUCOSE 145*   < > 104*   < > = values in this interval not displayed.   No results found for: CKTOTAL, CKMB, CKMBINDEX, TROPONINI Lab Results  Component Value Date   CHOL 116 07/13/2020   Lab Results  Component Value Date   HDL 32 (L) 07/13/2020   Lab Results  Component Value Date   LDLCALC 68 07/13/2020   Lab Results  Component Value Date   TRIG 80 07/13/2020   Lab Results  Component Value Date   CHOLHDL 3.6 07/13/2020   No results found for: LDLDIRECT  No results found for: DDIMER   Radiology/Studies:   CT Head Wo Contrast Result Date: 07/09/2020 CLINICAL DATA:  84 year old female with transient ischemic attack. EXAM: CT HEAD WITHOUT CONTRAST TECHNIQUE: Contiguous axial images were obtained from the base of the skull through the vertex without intravenous contrast. COMPARISON:  None. FINDINGS: Brain: There is moderate age-related atrophy and chronic microvascular ischemic changes. Probable focal  area of old infarct involving the right cerebellar hemisphere. Additional area of old infarct involving the right periventricular white matter and left frontal cortex. There is no acute intracranial hemorrhage. No mass effect or midline shift. Bilateral low attenuating subdural fluid over the convexities may be related to volume loss or represent old hygroma. Vascular: No hyperdense vessel or unexpected calcification. Skull: Normal. Negative for fracture or focal lesion. Sinuses/Orbits: There is complete opacification of the left maxillary sinus. The remainder of the visualized paranasal sinuses and mastoid air cells are clear. Other: None IMPRESSION: 1. No acute intracranial hemorrhage. 2. Moderate age-related atrophy and chronic microvascular ischemic changes. Areas of old infarct. Electronically Signed   By: Anner Crete M.D.   On: 07/09/2020 01:55    MR ANGIO HEAD WO CONTRAST Result Date: 07/13/2020 CLINICAL DATA:  Stroke, follow-up EXAM: MRA HEAD WITHOUT CONTRAST TECHNIQUE: Angiographic images of the Circle of Willis were obtained using MRA technique without intravenous contrast. COMPARISON:  None. FINDINGS: Motion artifact is present. Intracranial internal carotid arteries are patent. Middle and anterior cerebral arteries are patent. Intracranial vertebral arteries, basilar artery, posterior cerebral arteries are patent. Bilateral posterior communicating arteries are present. IMPRESSION: Motion degraded. No proximal intracranial vessel occlusion or definite significant stenosis. Electronically Signed   By: Macy Mis M.D.   On: 07/13/2020 09:06     MR BRAIN WO CONTRAST Result Date: 07/11/2020 CLINICAL DATA:  TIA.  Slurred speech. EXAM: MRI HEAD WITHOUT CONTRAST TECHNIQUE: Multiplanar, multiecho pulse sequences of the brain and surrounding structures were obtained without intravenous contrast. COMPARISON:  Head CT 2 days ago. FINDINGS: Brain: Numerous acute infarctions in the right hemisphere  scattered within the right middle cerebral artery territory. This includes the anterior temporal lobe, areas  within the insula. The right parietal cortical and subcortical brain, right caudate head and a few scattered punctate infarctions within the white matter. The areas of infarction show mild swelling but there is no evidence of hemorrhage or mass effect. Elsewhere, the brainstem appears normal. Old small vessel infarctions in the inferior right cerebellum. Old small vessel infarctions affect the right lateral thalamus and radiating white matter tracts enter seen throughout the cerebral hemispheric deep and subcortical white matter. Old left frontoparietal junction cortical infarction. No mass lesion, hydrocephalus or extra-axial collection Vascular: Major vessels at the base of the brain show flow. Skull and upper cervical spine: Negative Sinuses/Orbits: Mucosal inflammatory changes of the left maxillary sinus. Other sinuses clear. Orbits negative. Other: None IMPRESSION: 1. Numerous acute infarctions scattered within the right hemisphere within the right middle cerebral artery territory. Mild swelling but no hemorrhage or mass effect. 2. Extensive old ischemic changes elsewhere throughout the brain as outlined above. Electronically Signed   By: Nelson Chimes M.D.   On: 07/11/2020 14:55    VAS US CAROTID Result Date: 07/13/2020 Carotid Arterial Duplex Study Indications:       CVA. Risk Factors:      Hypertension, hyperlipidemia. Other Factors:     CHF, CKD IV. Comparison Study:  No prior study Performing Technologist: Sharion Dove RVS  Examination Guidelines: A complete evaluation includes B-mode imaging, spectral Doppler, color Doppler, and power Doppler as needed of all accessible portions of each vessel. Bilateral testing is considered an integral part of a complete examination. Limited examinations for reoccurring indications may be performed as noted.  Right Carotid  Summary: Right Carotid: The  extracranial vessels were near-normal with only minimal wall                thickening or plaque. Left Carotid: The extracranial vessels were near-normal with only minimal wall               thickening or plaque. Vertebrals:  Bilateral vertebral arteries demonstrate antegrade flow. Subclavians: Normal flow hemodynamics were seen in bilateral subclavian              arteries. *See table(s) above for measurements and observations.  Electronically signed by Servando Snare MD on 07/13/2020 at 9:19:39 AM.    Final     12-lead ECG SR All prior EKG's in EPIC reviewed with no documented atrial fibrillation  Telemetry SR, once NSVT 7 beats, rare brief PAT  Assessment and Plan:  1. Cryptogenic stroke The patient presents with cryptogenic stroke.  I spoke at length with the patient and her daughter at bedside about monitoring for afib with either a 30 day event monitor or an implantable loop recorder and rational for heart rhythm monitoring  Risks, benefits, and alteratives to implantable loop recorder were discussed with the patient today.   At this time, the patient (and her daughter) want to defer/have declined any monitoring.  The patient mentions that she has a yeast skin infection under her breasts that is bothering her.  (looks like she has some powder build up currently when looked at), is likely best to hold off on loop implant with possible skin infection. Will defer evaluation and management to medicine team  She has follow up with cardiology in place.  They can pursue event monitor or refer to EP outpatient should they become agreeable.   Please call with questions.   Renee Dyane Dustman, PA-C 07/14/2020  EP Attending  Agree with above. I will see her in the office  as an outpatient.   Salome Spotted.

## 2020-07-15 LAB — BASIC METABOLIC PANEL
Anion gap: 12 (ref 5–15)
BUN: 32 mg/dL — ABNORMAL HIGH (ref 8–23)
CO2: 26 mmol/L (ref 22–32)
Calcium: 8.8 mg/dL — ABNORMAL LOW (ref 8.9–10.3)
Chloride: 101 mmol/L (ref 98–111)
Creatinine, Ser: 2 mg/dL — ABNORMAL HIGH (ref 0.44–1.00)
GFR calc Af Amer: 26 mL/min — ABNORMAL LOW (ref >60)
GFR calc non Af Amer: 23 mL/min — ABNORMAL LOW (ref >60)
Glucose, Bld: 92 mg/dL (ref 70–99)
Potassium: 3.8 mmol/L (ref 3.5–5.1)
Sodium: 139 mmol/L (ref 135–145)

## 2020-07-15 SURGERY — ECHOCARDIOGRAM, TRANSESOPHAGEAL
Anesthesia: Monitor Anesthesia Care

## 2020-07-15 MED ORDER — FUROSEMIDE 40 MG PO TABS
40.0000 mg | ORAL_TABLET | Freq: Two times a day (BID) | ORAL | 1 refills | Status: DC
Start: 2020-07-15 — End: 2020-07-23

## 2020-07-15 MED ORDER — CARVEDILOL 6.25 MG PO TABS: 6.2500 mg | ORAL_TABLET | Freq: Two times a day (BID) | ORAL | 0 refills | Status: DC

## 2020-07-15 MED ORDER — POTASSIUM CHLORIDE CRYS ER 10 MEQ PO TBCR: 10.0000 meq | EXTENDED_RELEASE_TABLET | Freq: Every day | ORAL | 1 refills | Status: DC

## 2020-07-15 MED ORDER — ASPIRIN EC 81 MG PO TBEC: 81.0000 mg | DELAYED_RELEASE_TABLET | Freq: Every day | ORAL | 2 refills | Status: DC

## 2020-07-15 MED ORDER — NYSTATIN 100000 UNIT/GM EX POWD: Freq: Three times a day (TID) | CUTANEOUS | 0 refills | Status: DC

## 2020-07-15 MED ORDER — CLOPIDOGREL BISULFATE 75 MG PO TABS
75.0000 mg | ORAL_TABLET | Freq: Every day | ORAL | 0 refills | Status: DC
Start: 2020-07-15 — End: 2020-08-04

## 2020-07-15 NOTE — Plan of Care (Signed)
  Problem: Education: Goal: Ability to demonstrate management of disease process will improve Outcome: Adequate for Discharge Goal: Ability to verbalize understanding of medication therapies will improve Outcome: Adequate for Discharge Goal: Individualized Educational Video(s) Outcome: Adequate for Discharge   Problem: Activity: Goal: Capacity to carry out activities will improve Outcome: Adequate for Discharge   Problem: Cardiac: Goal: Ability to achieve and maintain adequate cardiopulmonary perfusion will improve Outcome: Adequate for Discharge   Problem: Education: Goal: Knowledge of disease or condition will improve Outcome: Adequate for Discharge Goal: Knowledge of secondary prevention will improve Outcome: Adequate for Discharge Goal: Knowledge of patient specific risk factors addressed and post discharge goals established will improve Outcome: Adequate for Discharge   Problem: Self-Care: Goal: Ability to participate in self-care as condition permits will improve Outcome: Adequate for Discharge Goal: Verbalization of feelings and concerns over difficulty with self-care will improve Outcome: Adequate for Discharge   Problem: Nutrition: Goal: Risk of aspiration will decrease Outcome: Adequate for Discharge

## 2020-07-15 NOTE — Progress Notes (Signed)
Physical Therapy Treatment Patient Details Name: Jillian [name] MRN: [id number] DOB: 1936/01/21 Today's Date: 07/15/2020    History of Present Illness 84 yo female with onset of CHF and elevated troponin was admitted and found to have NSTEMI.  Had sternal pressure and SOB but more resolved now. Brain MRI- acute Right MCA CVA with scattered infarcts. PMHx:  stroke, speech difficulties, CKD5, HTN, MI    PT Comments    Patient progressing well towards PT goals. Pt continues to have weakness in BLEs. Also noted to have impulsivity and poor awareness of safety/deficits putting pt at increased risk for falls. Attempted ambulation with RW however pt pushing it to the side, "I can walk on my own."  Discharge recommendation updated to Home with HHPT as family refusing SNF. Will follow.   Follow Up Recommendations  Home health PT;Supervision for mobility/OOB     Equipment Recommendations  None recommended by PT    Recommendations for Other Services       Precautions / Restrictions Precautions Precautions: Fall Restrictions Weight Bearing Restrictions: No    Mobility  Bed Mobility Overal bed mobility: Needs Assistance Bed Mobility: Supine to Sit     Supine to sit: [facility] elevated;Min assist     General bed mobility comments: assist with trunk to get to EOB.  Transfers Overall transfer level: Needs assistance Equipment used: Rolling walker (2 wheeled);None Transfers: Sit to/from Stand Sit to Stand: Mod assist;Min assist         General transfer comment: Min A to steady in standing from EOB; Mod A to stand from low toilet. Impulsive, transferred to chair post ambulation.  Ambulation/Gait Ambulation/Gait assistance: Min assist Gait Distance (Feet): 16 Feet (+24') Assistive device: Rolling walker (2 wheeled);1 person hand held assist;None Gait Pattern/deviations: Trunk flexed;Wide base of support;Step-through pattern;Step-to pattern Gait velocity: impulsive speed   General Gait  Details: Initially using RW for support; pushing it aside and using therapist's hand and ultimately grabbing bed rail to get to chair "i can walk on my own, don't need that." left knee instability noted but no buckling.   Stairs             Wheelchair Mobility    Modified Rankin (Stroke Patients Only) Modified Rankin (Stroke Patients Only) Pre-Morbid Rankin Score: Slight disability Modified Rankin: Moderately severe disability     Balance Overall balance assessment: Needs assistance Sitting-balance support: Feet supported;Single extremity supported Sitting balance-Leahy Scale: Fair     Standing balance support: During functional activity Standing balance-Leahy Scale: Poor Standing balance comment: Requires at least 1 UE support; reaching outside BoS to wipe down tray table, washing hands at sink.                            Cognition Arousal/Alertness: Awake/alert Behavior During Therapy: Impulsive Overall Cognitive Status: Impaired/Different from baseline Area of Impairment: Safety/judgement;Awareness                         Safety/Judgement: Decreased awareness of safety;Decreased awareness of deficits Awareness: Intellectual   General Comments: Poor awareness of safety/deficits; impulsive. Pushing RW away when walking in room.      Exercises      General Comments General comments (skin integrity, edema, etc.): Enjoys talking about her 9 y/o grandson      Pertinent Vitals/Pain Pain Assessment: No/denies pain    Home Living  Prior Function            PT Goals (current goals can now be found in the care plan section) Progress towards PT goals: Progressing toward goals    Frequency    Min 3X/week      PT Plan Discharge plan needs to be updated    Co-evaluation              AM-PAC PT "6 Clicks" Mobility   Outcome Measure  Help needed turning from your back to your side while in a flat  bed without using bedrails?: A Little Help needed moving from lying on your back to sitting on the side of a flat bed without using bedrails?: A Little Help needed moving to and from a bed to a chair (including a wheelchair)?: A Little Help needed standing up from a chair using your arms (e.g., wheelchair or bedside chair)?: A Little Help needed to walk in hospital room?: A Little Help needed climbing 3-5 steps with a railing? : Total 6 Click Score: 16    End of Session Equipment Utilized During Treatment: Gait belt Activity Tolerance: Patient tolerated treatment well Patient left: in chair;with call bell/phone within reach;with chair alarm set Nurse Communication: Mobility status PT Visit Diagnosis: Unsteadiness on feet (R26.81);History of falling (Z91.81);Muscle weakness (generalized) (M62.81)     Time: 3212-2482 PT Time Calculation (min) (ACUTE ONLY): 20 min  Charges:  $Therapeutic Activity: 8-22 mins                     Marisa Severin, PT, DPT Acute Rehabilitation Services Pager [phone] Office [phone]       Marguarite Arbour A Sabra Heck 07/15/2020, 12:47 PM

## 2020-07-15 NOTE — Progress Notes (Signed)
D/C instructions given and reviewed. No questions at this time. Encouraged to call with any concerns. Tele and IV removed. Tolerated well.

## 2020-07-15 NOTE — Progress Notes (Signed)
Patient's daughter would like a leave of absence work note for her mother's hospital stay to give to her employer. Patient's daughter is name Ryn Peine and her length of 07/08/20-07/15/20. MD has been paged to notify prior to discharge.

## 2020-07-15 NOTE — Discharge Summary (Signed)
Physician Discharge Summary  Jillian [name] YDX:[id number] DOB: November 14, 1936 DOA: 07/08/2020  PCP: Patient, No Pcp Per  Admit date: 07/08/2020 Discharge date: 07/15/2020  Admitted From: Home  Discharge disposition: Home with home health   Recommendations for Outpatient Follow-Up:   . Follow up with your primary care provider in one week.  Blood pressure medications have been adjusted.  Please adjust for normotension in 5 to 7 days. Patient does have a CKD stage IV.  She would benefit from nephrology follow-up as outpatient.  . Check CBC, BMP, magnesium in the next visit.  Patient is currently on Lasix and potassium supplements.  . Follow-up with cardiology on 08/04/2020.  Discuss about the loop recorder at that time.  Marland Kitchen Referral to [facility] has been made for follow-up of the stroke.  Discharge Diagnosis:   Principal Problem:   CHF exacerbation ([place]) Active Problems:   Acute hypoxemic respiratory failure (HCC)   Chest pain   Hypertensive emergency   CKD (chronic kidney disease) stage 4, GFR 15-29 ml/min (HCC)   Acute CVA (cerebrovascular accident) [facility])   Discharge Condition: Improved.  Diet recommendation: Low sodium, heart healthy.    Wound care: None.  Code status: Full.   History of Present Illness:   Jillian McKinnonis a 84 y.o.femalewith medical history significant ofhypertension, hyperlipidemia, obesity (BMI 31.32), CKD stage IV,CVApresented  to the ED with complaints of shortness of breath.  ED Course:Afebrile. WBC count 12.0. Hemoglobin 10.0, at baseline, potassium 3.4, chloride 105, bicarb 24, BUN 30, creatinine 1.9, and glucose 145. Baseline creatinine 1.7-1.9. Alkaline phosphatase chronically elevated, remainder of LFTs normal. Magnesium normal. BNP 285. High-sensitivity troponin 72 >93.EKG showing T wave inversions in inferior and lateral leads, no prior tracing for comparison. SARS-CoV-2 PCR test negative. Chest x-ray showed  cardiomegaly with mild vascular congestion and mild edema. Head CT negative for acute intracranial abnormality. Initially, satting well on room air but later desatted to 89% and became tachypneic with respiratory rate in the 30s. Blood pressure elevated with systolic up to 720N. Patient was given IV Lasix 20 mg, DuoNeb treatment, and started on nitroglycerin infusion.  Patient was then admitted to hospital for further evaluation and treatment.    Hospital Course:   Following conditions were addressed during hospitalization as listed below,  Acute hypoxemic respiratory failurelikelysecondary to acutediastolicCHF exacerbation. improved at this time.   2D echocardiogram on 07/10/2019 with LV ejection fraction of 55 to 47% with diastolic dysfunction. Cardiology has seen the patient and recommend oral Lasix 40 twice daily orally with potassium supplements.  Cardiology to follow-up as outpatient.   Acute right ischemic MCA territory CVA with history of CVA  Patient did have some expressive dysarthria but no other physical neurological deficits during hospitalization. MRI of the brain was performed with showed multiple acute infarct in the right MCA territory.  Neurology was consulted.  Spoke with Dr Leonie Man, neurology prior to discharge recommends aspirin Plavix for 3 weeks followed by aspirin low-dose alone.   Carotid duplex ultrasound with no significant stenosis.  2D echocardiogram with preserved LV function.  Hemoglobin A1c at 6.3.  Lipid profile noted with low HDL at 32 and LDL cholesterol of 68. MRA of the brain did not show any intracranial vessel occlusion or definite stenosis though it was a motion degraded study.    Neurology recommended a loop recorder and TEE but patient has refused  loop recorder placement at this time. Physical therapy on board and recommend skilled nursing facility placement but the family wishes  home with home health. Cardiology follow-up has been made to discuss about  loop recorder as outpatient.  Chest pain, elevated troponins.   Likely demand ischemia. EKG with T wave inversions in inferior and lateral leads. 2D echocardiogram performed 07/06/2020 showed LV ejection fraction of 55 to 60%. No regional wall motion abnormalities. Diastolic filling impairment noted.   Was initially on nitroglycerin drip for hypertensive emergency. We will continue amlodipine, Coreg, Lasix, hydralazine on discharge. Hold losartan on discharge.    Hypertensive emergency:Blood pressure significantly elevated on presentation with systolic up to [id number] the ED. Was on nitroglycerindrip due to hypertensive emergency and elevated troponin.Blood pressure has improved at this time. On oral medications with amlodipine, Coreg, Lasix .  Resume hydralazine on discharge.  Hold losartan on discharge..  Latest blood pressure of 148/57.  Goal is normotension in 5 to 7 days  Mild leukocytosis: Reactive.  Has normalized at this time.  WBC of 7.9   Mild hypokalemia: Resolved.  Latest potassium of 3.8.  Hyperlipidemia  Continue Crestor daily  CKD stage IV:  Creatinine 2.0, baseline 1.7-1.9.  Watch closely on diuretics.  Patient will benefit from nephrology follow-up as outpatient.  Disposition.  At this time, patient is stable for disposition home with home health.  Patient was advised to follow-up with cardiology, neurology and nephrology as outpatient.  Medical Consultants:    Cardiology  Neurology  Procedures:     None Subjective:   Today, patient was seen and examined at bedside.  Patient denies any shortness of breath, cough, fever, chills or rigor.  Discharge Exam:   Vitals:   07/15/20 0323 07/15/20 0743  BP: (!) 161/70 (!) 148/57  Pulse: 62 62  Resp: 18 17  Temp: 98.6 F (37 C) 98.8 F (37.1 C)  SpO2: 99% 96%   Vitals:   07/14/20 2025 07/15/20 0016 07/15/20 0323 07/15/20 0743  BP: 130/60 (!) 155/63 (!) 161/70 (!) 148/57  Pulse: (!) 59 (!) 58 62 62    Resp: 18 18 18 17   Temp: 97.8 F (36.6 C) 98.2 F (36.8 C) 98.6 F (37 C) 98.8 F (37.1 C)  TempSrc: Oral Oral Oral Oral  SpO2: 100% 100% 99% 96%  Weight:   84.5 kg   Height:        General: Alert awake, not in obvious distress, obese HENT: pupils equally reacting to light,  No scleral pallor or icterus noted. Oral mucosa is moist.  Chest:  Clear breath sounds.  Diminished breath sounds bilaterally. No crackles or wheezes.  CVS: S1 &S2 heard. No murmur.  Regular rate and rhythm. Abdomen: Soft, nontender, nondistended.  Bowel sounds are heard.   Extremities: No cyanosis, clubbing or edema.  Peripheral pulses are palpable. Psych: Alert, awake and oriented, normal mood CNS:  No cranial nerve deficits.  Power equal in all extremities.   Skin: Warm and dry.  No rashes noted.  The results of significant diagnostics from this hospitalization (including imaging, microbiology, ancillary and laboratory) are listed below for reference.     Diagnostic Studies:   CT Head Wo Contrast  Result Date: 07/09/2020 CLINICAL DATA:  84 year old female with transient ischemic attack. EXAM: CT HEAD WITHOUT CONTRAST TECHNIQUE: Contiguous axial images were obtained from the base of the skull through the vertex without intravenous contrast. COMPARISON:  None. FINDINGS: Brain: There is moderate age-related atrophy and chronic microvascular ischemic changes. Probable focal area of old infarct involving the right cerebellar hemisphere. Additional area of old infarct involving the right periventricular white matter and  left frontal cortex. There is no acute intracranial hemorrhage. No mass effect or midline shift. Bilateral low attenuating subdural fluid over the convexities may be related to volume loss or represent old hygroma. Vascular: No hyperdense vessel or unexpected calcification. Skull: Normal. Negative for fracture or focal lesion. Sinuses/Orbits: There is complete opacification of the left maxillary sinus.  The remainder of the visualized paranasal sinuses and mastoid air cells are clear. Other: None IMPRESSION: 1. No acute intracranial hemorrhage. 2. Moderate age-related atrophy and chronic microvascular ischemic changes. Areas of old infarct. Electronically Signed   By: Anner Crete M.D.   On: 07/09/2020 01:55   ECHOCARDIOGRAM COMPLETE  Result Date: 07/09/2020    ECHOCARDIOGRAM REPORT   Patient Name:   [facility] Date of Exam: 07/09/2020 Medical Rec #:  [id number]     Height:       67.0 in Accession #:    [id number]    Weight:       200.0 lb Date of Birth:  01/27/1936     BSA:          2.022 m Patient Age:    84 years      BP:           167/72 mmHg Patient Gender: F             HR:           94 bpm. Exam Location:  Inpatient Procedure: 2D Echo, Cardiac Doppler and Color Doppler Indications:    CHF-Acute Systolic 476.54 / Y50.35  History:        Patient has no prior history of Echocardiogram examinations.                 Stroke; Risk Factors:Hypertension.  Sonographer:    Vickie Epley RDCS Referring Phys: [id number] [place]  1. Left ventricular ejection fraction, by estimation, is 55 to 60%. The left ventricle has normal function. The left ventricle has no regional wall motion abnormalities. There is mild concentric left ventricular hypertrophy. Indeterminate diastolic filling due to E-A fusion.  2. Right ventricular systolic function is normal. The right ventricular size is normal. Tricuspid regurgitation signal is inadequate for assessing PA pressure.  3. The mitral valve is degenerative. Mild mitral valve regurgitation.  4. The aortic valve is tricuspid. Aortic valve regurgitation is not visualized. Mild aortic valve sclerosis is present, with no evidence of aortic valve stenosis.  5. The inferior vena cava is normal in size with greater than 50% respiratory variability, suggesting right atrial pressure of 3 mmHg. FINDINGS  Left Ventricle: Left ventricular ejection fraction, by  estimation, is 55 to 60%. The left ventricle has normal function. The left ventricle has no regional wall motion abnormalities. Definity contrast agent was given IV to delineate the left ventricular  endocardial borders. The left ventricular internal cavity size was normal in size. There is mild concentric left ventricular hypertrophy. Indeterminate diastolic filling due to E-A fusion. Right Ventricle: The right ventricular size is normal. No increase in right ventricular wall thickness. Right ventricular systolic function is normal. Tricuspid regurgitation signal is inadequate for assessing PA pressure. Left Atrium: Left atrial size was normal in size. Right Atrium: Right atrial size was normal in size. Pericardium: Trivial pericardial effusion is present. The pericardial effusion is circumferential. Mitral Valve: The mitral valve is degenerative in appearance. There is mild calcification of the anterior and posterior mitral valve leaflet(s). Mild mitral valve regurgitation. Tricuspid Valve: The tricuspid valve is grossly normal. Tricuspid valve regurgitation  is trivial. No evidence of tricuspid stenosis. Aortic Valve: The aortic valve is tricuspid. Aortic valve regurgitation is not visualized. Mild aortic valve sclerosis is present, with no evidence of aortic valve stenosis. There is mild calcification of the aortic valve. Pulmonic Valve: The pulmonic valve was grossly normal. Pulmonic valve regurgitation is not visualized. No evidence of pulmonic stenosis. Aorta: The aortic root is normal in size and structure. Venous: The inferior vena cava is normal in size with greater than 50% respiratory variability, suggesting right atrial pressure of 3 mmHg. IAS/Shunts: The atrial septum is grossly normal.  LEFT VENTRICLE PLAX 2D LVIDd:         5.40 cm      Diastology LVIDs:         3.50 cm      LV e' lateral:   5.10 cm/s LV PW:         0.90 cm      LV E/e' lateral: 23.1 LV IVS:        0.90 cm      LV e' medial:    3.98  cm/s LVOT diam:     1.80 cm      LV E/e' medial:  29.6 LV SV:         51 LV SV Index:   25 LVOT Area:     2.54 cm  LV Volumes (MOD) LV vol d, MOD A2C: 101.0 ml LV vol d, MOD A4C: 98.9 ml LV vol s, MOD A2C: 53.6 ml LV vol s, MOD A4C: 47.3 ml LV SV MOD A2C:     47.4 ml LV SV MOD A4C:     98.9 ml LV SV MOD BP:      49.4 ml RIGHT VENTRICLE RV S prime:     11.00 cm/s TAPSE (M-mode): 1.8 cm LEFT ATRIUM             Index       RIGHT ATRIUM           Index LA diam:        4.50 cm 2.23 cm/m  RA Area:     13.30 cm LA Vol (A2C):   54.6 ml 27.00 ml/m RA Volume:   29.10 ml  14.39 ml/m LA Vol (A4C):   31.6 ml 15.63 ml/m LA Biplane Vol: 43.4 ml 21.46 ml/m  AORTIC VALVE LVOT Vmax:   117.00 cm/s LVOT Vmean:  74.400 cm/s LVOT VTI:    0.199 m  AORTA Ao Root diam: 2.80 cm MITRAL VALVE MV Area (PHT): 4.96 cm     SHUNTS MV Decel Time: 153 msec     Systemic VTI:  0.20 m MR Peak grad: 124.5 mmHg    Systemic Diam: 1.80 cm MR Mean grad: 87.0 mmHg MR Vmax:      558.00 cm/s MR Vmean:     450.0 cm/s MV E velocity: 118.00 cm/s MV A velocity: 35.90 cm/s MV E/A ratio:  3.29 Eleonore Chiquito MD Electronically signed by Eleonore Chiquito MD Signature Date/Time: 07/09/2020/12:14:01 PM    Final      Labs:   Basic Metabolic Panel: Recent Labs  Lab 07/09/20 0018 07/09/20 0046 07/10/20 0359 07/10/20 0359 07/11/20 0753 07/11/20 0753 07/12/20 0240 07/12/20 0240 07/13/20 0459 07/13/20 0459 07/14/20 0509 07/15/20 0728  NA   < >  --  139   < > 137  --  136  --  137  --  138 139  K   < >  --  3.7   < >  3.7   < > 3.5   < > 3.6   < > 3.8 3.8  CL   < >  --  101   < > 100  --  98  --  99  --  102 101  CO2   < >  --  25   < > 28  --  27  --  27  --  26 26  GLUCOSE   < >  --  110*   < > 92  --  97  --  107*  --  104* 92  BUN   < >  --  27*   < > 24*  --  24*  --  24*  --  31* 32*  CREATININE   < >  --  2.16*   < > 2.04*  --  2.05*  --  2.02*  --  2.24* 2.00*  CALCIUM   < >  --  8.9   < > 8.9  --  8.8*  --  8.9  --  8.9 8.8*  MG  --  2.1  1.8  --   --   --   --   --   --   --  1.9  --   PHOS  --   --  2.7  --   --   --   --   --   --   --   --   --    < > = values in this interval not displayed.   GFR Estimated Creatinine Clearance: 24.2 mL/min (A) (by C-G formula based on SCr of 2 mg/dL (H)). Liver Function Tests: Recent Labs  Lab 07/09/20 0018  AST 27  ALT 27  ALKPHOS 170*  BILITOT 0.7  PROT 7.4  ALBUMIN 3.2*   No results for input(s): LIPASE, AMYLASE in the last 168 hours. No results for input(s): AMMONIA in the last 168 hours. Coagulation profile No results for input(s): INR, PROTIME in the last 168 hours.  CBC: Recent Labs  Lab 07/09/20 0018 07/10/20 0359 07/11/20 0753 07/12/20 0240 07/14/20 0509  WBC 12.0* 10.1 8.9 7.2 7.9  NEUTROABS 9.0*  --   --   --   --   HGB 10.0* 9.8* 9.4* 8.9* 9.6*  HCT 33.2* 32.4* 30.6* 29.7* 31.8*  MCV 88.5 88.3 87.4 85.8 87.4  PLT 244 255 227 217 238   Cardiac Enzymes: No results for input(s): CKTOTAL, CKMB, CKMBINDEX, TROPONINI in the last 168 hours. BNP: Invalid input(s): POCBNP CBG: No results for input(s): GLUCAP in the last 168 hours. D-Dimer No results for input(s): DDIMER in the last 72 hours. Hgb A1c Recent Labs    07/13/20 0459  HGBA1C 6.3*   Lipid Profile Recent Labs    07/13/20 0459  CHOL 116  HDL 32*  LDLCALC 68  TRIG 80  CHOLHDL 3.6   Thyroid function studies No results for input(s): TSH, T4TOTAL, T3FREE, THYROIDAB in the last 72 hours.  Invalid input(s): FREET3 Anemia work up No results for input(s): VITAMINB12, FOLATE, FERRITIN, TIBC, IRON, RETICCTPCT in the last 72 hours. Microbiology Recent Results (from the past 240 hour(s))  SARS Coronavirus 2 by RT PCR (hospital order, performed in [facility] hospital lab) Nasopharyngeal Nasopharyngeal Swab     Status: None   Collection Time: 07/09/20 12:53 AM   Specimen: Nasopharyngeal Swab  Result Value Ref Range Status   SARS Coronavirus 2 NEGATIVE NEGATIVE Final    Comment:  (NOTE) SARS-CoV-2 target  nucleic acids are NOT DETECTED.  The SARS-CoV-2 RNA is generally detectable in upper and lower respiratory specimens during the acute phase of infection. The lowest concentration of SARS-CoV-2 viral copies this assay can detect is 250 copies / mL. A negative result does not preclude SARS-CoV-2 infection and should not be used as the sole basis for treatment or other patient management decisions.  A negative result may occur with improper specimen collection / handling, submission of specimen other than nasopharyngeal swab, presence of viral mutation(s) within the areas targeted by this assay, and inadequate number of viral copies (<250 copies / mL). A negative result must be combined with clinical observations, patient history, and epidemiological information.  Fact Sheet for Patients:   StrictlyIdeas.no  Fact Sheet for Healthcare Providers: BankingDealers.co.za  This test is not yet approved or  cleared by the [place] FDA and has been authorized for detection and/or diagnosis of SARS-CoV-2 by FDA under an Emergency Use Authorization (EUA).  This EUA will remain in effect (meaning this test can be used) for the duration of the COVID-19 declaration under Section 564(b)(1) of the Act, 21 U.S.C. section 360bbb-3(b)(1), unless the authorization is terminated or revoked sooner.  Performed at [facility], [place] [address] [place], [place] [address]      Discharge Instructions:   Discharge Instructions    Ambulatory referral to Neurology   Complete by: As directed    An appointment is requested in approximately: 2 weeks.  Follow-up for for stroke.   Diet - low sodium heart healthy   Complete by: As directed    Discharge instructions   Complete by: As directed    Follow up with your primary care provider in 1 week. Check blood work at that time. Follow up with cardiology as has been scheduled on  08/04/2020, discuss about loop recorder if desired. Take aspirin and plavix for 3 weeks then aspirin alone.  Follow-up with [facility] in stroke clinic as outpatient.  Your blood pressure medications have been changed.  You will have to follow-up with your primary care physician for adequate control of your blood pressure.  Please continue physical therapy at home.   Increase activity slowly   Complete by: As directed      Allergies as of 07/15/2020      Reactions   Penicillins Nausea Only      Medication List    STOP taking these medications   losartan 100 MG tablet Commonly known as: COZAAR     TAKE these medications   amLODipine 5 MG tablet Commonly known as: NORVASC Take 5 mg by mouth daily.   aspirin EC 81 MG tablet Take 1 tablet (81 mg total) by mouth daily. Swallow whole.   carvedilol 6.25 MG tablet Commonly known as: COREG Take 1 tablet (6.25 mg total) by mouth 2 (two) times daily with a meal for 21 days.   clopidogrel 75 MG tablet Commonly known as: PLAVIX Take 1 tablet (75 mg total) by mouth daily for 21 days.   furosemide 40 MG tablet Commonly known as: LASIX Take 1 tablet (40 mg total) by mouth 2 (two) times daily.   hydrALAZINE 50 MG tablet Commonly known as: APRESOLINE Take 50 mg by mouth 3 (three) times daily.   nystatin powder Commonly known as: MYCOSTATIN/NYSTOP Apply topically 3 (three) times daily.   potassium chloride 10 MEQ tablet Commonly known as: KLOR-CON Take 1 tablet (10 mEq total) by mouth daily. Start taking on: July 16, 2020  rosuvastatin 20 MG tablet Commonly known as: CRESTOR Take 20 mg by mouth daily.            Durable Medical Equipment  (From admission, onward)         Start     Ordered   07/14/20 1633  For home use only DME 3 n 1  Once        07/14/20 1632   07/14/20 1632  For home use only DME 4 wheeled rolling walker with seat  Once       Question:  Patient needs a walker to treat with the  following condition  Answer:  Weakness   07/14/20 1632          Follow-up Information    Fay Records, MD Follow up on 08/04/2020.   Specialty: Cardiology Why: at 11:15 AM  Truitt Merle, NP  Contact information: [address] [address] [place], [place] Follow up.   Why: HHRN,HHPT Contact information: [place] [address] [phone]        [facility] Follow up.   Why: 3 n 1 , rollator Contact information: [place] [address] [phone]                Time coordinating discharge: 39 minutes  Signed:  Vivien Barretto  [facility] 07/15/2020, 10:03 AM

## 2020-07-15 NOTE — Care Management Important Message (Signed)
Important Message  Patient Details  Name: Jillian [name] MRN: [id number] Date of Birth: [birth year]   Medicare Important Message Given:  Yes  Mailed patient IM letter upon discharged.     Holli Humbles Smith 07/15/2020, 10:10 AM

## 2020-07-23 ENCOUNTER — Encounter: Payer: Self-pay | Admitting: Family Medicine

## 2020-07-23 ENCOUNTER — Other Ambulatory Visit: Payer: Self-pay

## 2020-07-23 ENCOUNTER — Ambulatory Visit: Payer: Medicare Other | Attending: Family Medicine | Admitting: Family Medicine

## 2020-07-23 VITALS — BP 172/72 | HR 72 | Temp 98.6°F | Resp 16 | Ht 61.0 in | Wt 186.0 lb

## 2020-07-23 DIAGNOSIS — Z09 Encounter for follow-up examination after completed treatment for conditions other than malignant neoplasm: Secondary | ICD-10-CM

## 2020-07-23 DIAGNOSIS — E782 Mixed hyperlipidemia: Secondary | ICD-10-CM

## 2020-07-23 DIAGNOSIS — I5032 Chronic diastolic (congestive) heart failure: Secondary | ICD-10-CM

## 2020-07-23 DIAGNOSIS — J309 Allergic rhinitis, unspecified: Secondary | ICD-10-CM

## 2020-07-23 DIAGNOSIS — I69322 Dysarthria following cerebral infarction: Secondary | ICD-10-CM | POA: Diagnosis not present

## 2020-07-23 DIAGNOSIS — I129 Hypertensive chronic kidney disease with stage 1 through stage 4 chronic kidney disease, or unspecified chronic kidney disease: Secondary | ICD-10-CM | POA: Diagnosis not present

## 2020-07-23 DIAGNOSIS — Z8673 Personal history of transient ischemic attack (TIA), and cerebral infarction without residual deficits: Secondary | ICD-10-CM

## 2020-07-23 DIAGNOSIS — D631 Anemia in chronic kidney disease: Secondary | ICD-10-CM

## 2020-07-23 DIAGNOSIS — N184 Chronic kidney disease, stage 4 (severe): Secondary | ICD-10-CM

## 2020-07-23 DIAGNOSIS — K5900 Constipation, unspecified: Secondary | ICD-10-CM

## 2020-07-23 DIAGNOSIS — I160 Hypertensive urgency: Secondary | ICD-10-CM

## 2020-07-23 DIAGNOSIS — J0140 Acute pansinusitis, unspecified: Secondary | ICD-10-CM

## 2020-07-23 MED ORDER — AMLODIPINE BESYLATE 5 MG PO TABS: 5.0000 mg | ORAL_TABLET | Freq: Every day | ORAL | 1 refills | Status: DC

## 2020-07-23 MED ORDER — FUROSEMIDE 40 MG PO TABS: 40.0000 mg | ORAL_TABLET | Freq: Two times a day (BID) | ORAL | 1 refills | Status: DC

## 2020-07-23 MED ORDER — ASPIRIN EC 81 MG PO TBEC: 81.0000 mg | DELAYED_RELEASE_TABLET | Freq: Every day | ORAL | 3 refills | Status: DC

## 2020-07-23 MED ORDER — POTASSIUM CHLORIDE CRYS ER 10 MEQ PO TBCR: 10.0000 meq | EXTENDED_RELEASE_TABLET | Freq: Every day | ORAL | 1 refills | Status: DC

## 2020-07-23 MED ORDER — HYDRALAZINE HCL 50 MG PO TABS: 50.0000 mg | ORAL_TABLET | Freq: Three times a day (TID) | ORAL | 1 refills | Status: DC

## 2020-07-23 MED ORDER — BLOOD PRESSURE MONITOR DEVI: 0 refills | Status: DC

## 2020-07-23 MED ORDER — POLYETHYLENE GLYCOL 3350 17 GM/SCOOP PO POWD: ORAL | 11 refills | Status: DC

## 2020-07-23 MED ORDER — CARVEDILOL 6.25 MG PO TABS: ORAL_TABLET | ORAL | 1 refills | Status: DC

## 2020-07-23 MED ORDER — CEFDINIR 300 MG PO CAPS: 300.0000 mg | ORAL_CAPSULE | Freq: Every day | ORAL | 0 refills | Status: AC

## 2020-07-23 MED ORDER — LORATADINE 10 MG PO TABS: 10.0000 mg | ORAL_TABLET | Freq: Every day | ORAL | 3 refills | Status: DC

## 2020-07-23 MED ORDER — ROSUVASTATIN CALCIUM 20 MG PO TABS: 20.0000 mg | ORAL_TABLET | Freq: Every day | ORAL | 3 refills | Status: DC

## 2020-07-23 NOTE — Progress Notes (Signed)
Hospital F/u

## 2020-07-23 NOTE — Progress Notes (Signed)
New Patient Office Visit  Subjective:  Patient ID: Jillian [name], female    DOB: 1936/04/10  Age: 84 y.o. MRN: [id number]  CC: Establish care status post recent hospital discharge  HPI Jillian [name], 84 year old African-American female, with medical history significant for hypertension, hyperlipidemia, stage IV chronic kidney disease and CVA who is status post recent hospitalization from 06/18/2020 through 07/15/2020 at [facility] after presenting to the ED via EMS with complaint of increased shortness of breath and progressive onset of lower extremity swelling.  At time of hospital discharge, patient with diagnosis of CHF exacerbation, acute hypoxic respiratory failure, chest pain, hypertensive emergency, chronic kidney disease stage IV, GFR 15 to 29 mL/min and acute CVA.  During hospitalization, patient's blood pressures were elevated with systolic pressures in the 210s.  Echocardiogram with ejection fraction of 55 to 00% with diastolic dysfunction.  Patient with MRI showing multiple acute infarcts in the right MCA territory.  Hemoglobin A1c was elevated at 6.3, hemoglobin at presentation was 10.0 and creatinine of 1.9 with BUN of 30.  Baseline creatinine per discharge summary as 1.7-1.9 for patient.  At time of discharge, blood pressure was 148/57 with oral medications including amlodipine, Coreg and Lasix and patient was to resume the use of home hydralazine on discharge.  Patient also had mild hypokalemia for which she received potassium replacement.  Creatinine was 2.0 at time of discharge.        Patient is accompanied by her daughter and grandson at today's visit.  Per patient and daughter, patient has taken her blood pressure medication shortly before today's visit.  Daughter reports that patient does have follow-up as an outpatient with cardiology regarding CHF and has neurology appointment in follow-up of CVA.  Patient reports that she feels well at today's visit.  She denies  any increased shortness of breath and no leg swelling.  She did have some chest pain at the time of hospital admission but has had no sensation of chest pain or palpitations since her hospital discharge.  Appointment is needed for nephrology follow-up.  She has some urinary frequency which she attributes to her use of Lasix.  She also reports issues with constipation.  She denies any blood in the stool or black stools.  On review of systems, she has had 2 or more weeks of nasal congestion, postnasal drainage, progressively worsening green nasal discharge and frontal headache/facial pressure as well as pressure in both cheeks.  Past Medical History:  Diagnosis Date  . Hypertension   . Renal disorder    CKD Stage IV  . Stroke [facility])     No past surgical history on file.  No family history on file.  Social History   Socioeconomic History  . Marital status: Unknown    Spouse name: Not on file  . Number of children: Not on file  . Years of education: Not on file  . Highest education level: Not on file  Occupational History  . Not on file  Tobacco Use  . Smoking status: Never Smoker  . Smokeless tobacco: Never Used  Substance and Sexual Activity  . Alcohol use: Not on file  . Drug use: Not on file  . Sexual activity: Not on file  Other Topics Concern  . Not on file  Social History Narrative  . Not on file   Social Determinants of Health   Financial Resource Strain:   . Difficulty of Paying Living Expenses: Not on file  Food Insecurity:   .  Worried About Charity fundraiser in the Last Year: Not on file  . Ran Out of Food in the Last Year: Not on file  Transportation Needs:   . Lack of Transportation (Medical): Not on file  . Lack of Transportation (Non-Medical): Not on file  Physical Activity:   . Days of Exercise per Week: Not on file  . Minutes of Exercise per Session: Not on file  Stress:   . Feeling of Stress : Not on file  Social Connections:   . Frequency of  Communication with Friends and Family: Not on file  . Frequency of Social Gatherings with Friends and Family: Not on file  . Attends Religious Services: Not on file  . Active Member of Clubs or Organizations: Not on file  . Attends Archivist Meetings: Not on file  . Marital Status: Not on file  Intimate Partner Violence:   . Fear of Current or Ex-Partner: Not on file  . Emotionally Abused: Not on file  . Physically Abused: Not on file  . Sexually Abused: Not on file    ROS Review of Systems  Constitutional: Positive for fatigue (improving). Negative for chills and fever.  HENT: Positive for congestion, sinus pressure, sinus pain and sneezing. Negative for ear pain, sore throat and trouble swallowing.   Respiratory: Negative for cough and shortness of breath.   Cardiovascular: Negative for chest pain, palpitations and leg swelling.  Gastrointestinal: Positive for constipation. Negative for abdominal pain, diarrhea and nausea.  Endocrine: Positive for polyuria (feels that this is related to lasix use). Negative for polydipsia and polyphagia.  Genitourinary: Positive for frequency. Negative for dysuria.  Musculoskeletal: Positive for back pain (occasional). Negative for arthralgias.  Skin: Negative for rash and wound.  Neurological: Positive for headaches (frontal headache/facial pressure). Negative for dizziness.  Hematological: Negative for adenopathy. Does not bruise/bleed easily.  Psychiatric/Behavioral: Negative for sleep disturbance. The patient is not nervous/anxious.     Objective:   Today's Vitals: BP (!) 204/74   Pulse 72   Temp 98.6 F (37 C)   Resp 16   Ht 5\' 1"  (1.549 m)   Wt 186 lb (84.4 kg)   SpO2 100%   BMI 35.14 kg/m Repeat blood pressure 174/84 manual  Physical Exam Constitutional:      General: She is not in acute distress.    Appearance: Normal appearance. She is not ill-appearing.     Comments: WNWD elderly female in NAD sitting in a  wheelchair in exam room. She is accompanied by her daughter and grandson  HENT:     Nose: Congestion and rhinorrhea present.  Neck:     Vascular: No carotid bruit.  Cardiovascular:     Rate and Rhythm: Normal rate and regular rhythm.  Pulmonary:     Effort: Pulmonary effort is normal.     Breath sounds: Normal breath sounds.  Abdominal:     General: There is distension (Mild mid to lower abdominal distention).     Palpations: Abdomen is soft.     Tenderness: There is no abdominal tenderness. There is no right CVA tenderness, left CVA tenderness or guarding.  Musculoskeletal:     Cervical back: Normal range of motion and neck supple.     Right lower leg: No edema.     Left lower leg: No edema.  Lymphadenopathy:     Cervical: No cervical adenopathy.  Skin:    General: Skin is warm and dry.  Neurological:     Mental Status:  She is alert and oriented to person, place, and time.     Cranial Nerves: No cranial nerve deficit.  Psychiatric:        Mood and Affect: Mood normal.        Behavior: Behavior normal.     Assessment & Plan:  1. Chronic diastolic heart failure ([place]); Hospital discharge follow-up  Patient is status post recent hospitalization for acute on chronic diastolic heart failure with respiratory hypoxia.  CHF appears to be stable at this time as she does not have any significant lower extremity edema and does not have any complaints of significant shortness of breath.  We will repeat basic metabolic panel, and magnesium as per hospital discharge summary request due to patient's use of diuretic medication/CHF as well as stage IV chronic kidney disease.  She is provided with medication refills as per hospital discharge summary.  Referral is being made for patient to be followed by nephrology and daughter confirms that patient does have upcoming appointments with cardiology and neurology for outpatient follow-up of CHF/history of CAD as well as for follow-up of acute CVA noted on  MRI during recent hospitalization. - Basic Metabolic Panel - Magnesium - potassium chloride (KLOR-CON) 10 MEQ tablet; Take 1 tablet (10 mEq total) by mouth daily.  Dispense: 90 tablet; Refill: 1 - furosemide (LASIX) 40 MG tablet; Take 1 tablet (40 mg total) by mouth 2 (two) times daily.  Dispense: 180 tablet; Refill: 1 - carvedilol (COREG) 6.25 MG tablet; One pill twice daily with meals  Dispense: 180 tablet; Refill: 1 - Blood Pressure Monitor DEVI; Use to monitor blood pressure daily  Dispense: 1 each; Refill: 0  2. 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  At today's visit, patient speech is fluid, she does have some occasional issues with word finding but speech impairment appears to be mild.  3. Hypertension with impaired renal function Patient with chronic issues with hypertension with chronic kidney disease, stage IV.  Refills provided of amlodipine, hydralazine and she will also continue use of Lasix for CHF which can also help with blood pressure.  Prescription is also provided for patient to obtain a blood pressure monitor. - amLODipine (NORVASC) 5 MG tablet; Take 1 tablet (5 mg total) by mouth daily.  Dispense: 90 tablet; Refill: 1 - hydrALAZINE (APRESOLINE) 50 MG tablet; Take 1 tablet (50 mg total) by mouth 3 (three) times daily. To lower blood pressure  Dispense: 270 tablet; Refill: 1 - Blood Pressure Monitor DEVI; Use to monitor blood pressure daily  Dispense: 1 each; Refill: 0  4. CKD (chronic kidney disease) stage 4, GFR 15-29 ml/min (HCC); anemia due to chronic kidney disease, stage IV She will have repeat basic metabolic panel in follow-up of chronic kidney disease as well as CBC to look for any worsening of anemia as patient with hemoglobin of 10.0 during recent hospitalization.  Referral is being placed for patient to follow-up with nephrology. - CBC with  Differential - Basic Metabolic Panel - Ambulatory referral to Nephrology  5. Mixed dyslipidemia Patient with mixed dyslipidemia and prior history of possible MI as well as recurrent CVA.  Continue Crestor 20 mg as per hospital discharge.  We will check LFTs at her next appointment in follow-up of medication use. - rosuvastatin (CRESTOR) 20 MG tablet; Take 1 tablet (20 mg total) by mouth daily. To lower cholesterol  Dispense: 90 tablet; Refill: 3  6. Hospital discharge follow-up Notes including H&P as well as discharge summary with relevant labs and imaging from recent hospitalization reviewed and discussed with the patient and her daughter at today's visit.  7. Hypertensive urgency Patient's blood pressure was initially elevated with systolic in the 626R but patient reports that she had only taken her blood pressure medications just before coming to today's visit.  Blood pressure was repeated x3 at today's visit with blood pressure of 174/84.  Hospital discharge medications reviewed and new prescriptions provided for amlodipine, Coreg, Lasix, potassium as well as hydralazine.  It is unclear if patient had restarted use of hydralazine status post hospital discharge as per hospital discharge summary.  She was on losartan prior to hospitalization which was discontinued during her hospital stay.  8. Subacute pansinusitis Patient with facial pressure and complaints of discolored nasal drainage greater than 2 weeks.  Prescription provided for Omnicef 300 mg daily-dose reduced due to renal function and prescription also provided for loratadine to help with postnasal drainage/allergy symptoms. - cefdinir (OMNICEF) 300 MG capsule; Take 1 capsule (300 mg total) by mouth daily for 7 days.  Dispense: 7 capsule; Refill: 0  9. Allergic rhinitis, unspecified seasonality, unspecified trigger Prescription provided for loratadine 10 mg daily which should help with patient's nasal congestion and postnasal drainage.   Discussed with patient and daughter that decongestants should be avoided as they can raise the patient's blood pressure. - loratadine (CLARITIN) 10 MG tablet; Take 1 tablet (10 mg total) by mouth daily. As needed for nasal congestion  Dispense: 90 tablet; Refill: 3  10. Constipation, unspecified constipation type She reports issues with constipation and prescription provided for MiraLAX to use daily for constipation relief.  We will also check T4 and TSH to rule out hyperthyroidism as a cause or contributing factor to patient's constipation. - polyethylene glycol powder (GLYCOLAX/MIRALAX) 17 GM/SCOOP powder; Add 17 gm to 16 ounces or more of fluid once per day as needed for constipation relief  Dispense: 507 g; Refill: 11 - T4 AND TSH  11. History of CVA (cerebrovascular accident) She is status post acute CVA during recent hospitalization and has history of prior CVA.  She will continue use of daily aspirin.  She was provided with prescription for 3 weeks of Plavix to take along with aspirin status post hospitalization.  Daughter reports the patient does have upcoming neurology appointment.  She had no evidence of carotid stenosis on duplex Dopplers done during hospitalization.  Patient and daughter are aware of the importance of maintaining good control of blood pressure to help prevent additional CVAs.  Also continue use of Crestor. - aspirin EC 81 MG tablet; Take 1 tablet (81 mg total) by mouth daily. Swallow whole.  Dispense: 90 tablet; Refill: 3   Outpatient Encounter Medications as of 07/23/2020  Medication Sig  . amLODipine (NORVASC) 5 MG tablet Take 5 mg by mouth daily.  Marland Kitchen aspirin EC 81 MG tablet Take 1 tablet (81 mg total) by mouth daily. Swallow whole.  . carvedilol (COREG) 6.25 MG tablet Take 1 tablet (6.25 mg total) by mouth 2 (two) times daily with a meal for 21 days.  . clopidogrel (PLAVIX) 75 MG tablet Take 1 tablet (75 mg total) by mouth daily for 21 days.  . furosemide (LASIX) 40 MG  tablet Take 1 tablet (40 mg total) by mouth 2 (two) times daily.  . hydrALAZINE (APRESOLINE) 50 MG tablet Take 50 mg by mouth 3 (three) times daily.  Marland Kitchen nystatin (MYCOSTATIN/NYSTOP) powder Apply  topically 3 (three) times daily.  . potassium chloride (KLOR-CON) 10 MEQ tablet Take 1 tablet (10 mEq total) by mouth daily.  . rosuvastatin (CRESTOR) 20 MG tablet Take 20 mg by mouth daily.   No facility-administered encounter medications on file as of 07/23/2020.     Follow-up: Return in about 4 weeks (around 08/20/2020) for chronic issue;' sooner if needed.   Antony Blackbird, MD

## 2020-07-23 NOTE — Patient Instructions (Addendum)
Take blood pressure medication daily-amlodipine, hydralazine, Coreg and lasix for edema Chronic Kidney Disease, Adult Chronic kidney disease (CKD) happens when the kidneys are damaged over a long period of time. The kidneys are two organs that help with:  Getting rid of waste and extra fluid from the blood.  Making hormones that maintain the amount of fluid in your tissues and blood vessels.  Making sure that the body has the right amount of fluids and chemicals. Most of the time, CKD does not go away, but it can usually be controlled. Steps must be taken to slow down the kidney damage or to stop it from getting worse. If this is not done, the kidneys may stop working. Follow these instructions at home: Medicines  Take over-the-counter and prescription medicines only as told by your doctor. You may need to change the amount of medicines you take.  Do not take any new medicines unless your doctor says it is okay. Many medicines can make your kidney damage worse.  Do not take any vitamin and supplements unless your doctor says it is okay. Many vitamins and supplements can make your kidney damage worse. General instructions  Follow a diet as told by your doctor. You may need to stay away from: ? Alcohol. ? Salty foods. ? Foods that are high in:  Potassium.  Calcium.  Protein.  Do not use any products that contain nicotine or tobacco, such as cigarettes and e-cigarettes. If you need help quitting, ask your doctor.  Keep track of your blood pressure at home. Tell your doctor about any changes.  If you have diabetes, keep track of your blood sugar as told by your doctor.  Try to stay at a healthy weight. If you need help, ask your doctor.  Exercise at least 30 minutes a day, 5 days a week.  Stay up-to-date with your shots (immunizations) as told by your doctor.  Keep all follow-up visits as told by your doctor. This is important. Contact a doctor if:  Your symptoms get  worse.  You have new symptoms. Get help right away if:  You have symptoms of end-stage kidney disease. These may include: ? Headaches. ? Numbness in your hands or feet. ? Easy bruising. ? Having hiccups often. ? Chest pain. ? Shortness of breath. ? Stopping of menstrual periods in women.  You have a fever.  You have very little pee (urine).  You have pain or bleeding when you pee. Summary  Chronic kidney disease (CKD) happens when the kidneys are damaged over a long period of time.  Most of the time, this condition does not go away, but it can usually be controlled. Steps must be taken to slow down the kidney damage or to stop it from getting worse.  Treatment may include a combination of medicines and lifestyle changes. This information is not intended to replace advice given to you by your health care provider. Make sure you discuss any questions you have with your health care provider. Document Revised: 10/14/2017 Document Reviewed: 12/06/2016 Elsevier Patient Education  [place].  Heart Failure, Diagnosis  Heart failure means that your heart is not able to pump blood in the right way. This makes it hard for your body to work well. Heart failure is usually a long-term (chronic) condition. You must take good care of yourself and follow your treatment plan from your doctor. What are the causes? This condition may be caused by:  High blood pressure.  Build up of cholesterol and  fat in the arteries.  Heart attack. This injures the heart muscle.  Heart valves that do not open and close properly.  Damage of the heart muscle. This is also called cardiomyopathy.  Lung disease.  Abnormal heart rhythms. What increases the risk? The risk of heart failure goes up as a person ages. This condition is also more likely to develop in people who:  Are overweight.  Are female.  Smoke or chew tobacco.  Abuse alcohol or illegal drugs.  Have taken medicines that can  damage the heart.  Have diabetes.  Have abnormal heart rhythms.  Have thyroid problems.  Have low blood counts (anemia). What are the signs or symptoms? Symptoms of this condition include:  Shortness of breath.  Coughing.  Swelling of the feet, ankles, legs, or belly.  Losing weight for no reason.  Trouble breathing.  Waking from sleep because of the need to sit up and get more air.  Rapid heartbeat.  Being very tired.  Feeling dizzy, or feeling like you may pass out (faint).  Having no desire to eat.  Feeling like you may vomit (nauseous).  Peeing (urinating) more at night.  Feeling confused. How is this treated?     This condition may be treated with:  Medicines. These can be given to treat blood pressure and to make the heart muscles stronger.  Changes in your daily life. These may include eating a healthy diet, staying at a healthy body weight, quitting tobacco and illegal drug use, or doing exercises.  Surgery. Surgery can be done to open blocked valves, or to put devices in the heart, such as pacemakers.  A donor heart (heart transplant). You will receive a healthy heart from a donor. Follow these instructions at home:  Treat other conditions as told by your doctor. These may include high blood pressure, diabetes, thyroid disease, or abnormal heart rhythms.  Learn as much as you can about heart failure.  Get support as you need it.  Keep all follow-up visits as told by your doctor. This is important. Summary  Heart failure means that your heart is not able to pump blood in the right way.  This condition is caused by high blood pressure, heart attack, or damage of the heart muscle.  Symptoms of this condition include shortness of breath and swelling of the feet, ankles, legs, or belly. You may also feel very tired or feel like you may vomit.  You may be treated with medicines, surgery, or changes in your daily life.  Treat other health  conditions as told by your doctor. This information is not intended to replace advice given to you by your health care provider. Make sure you discuss any questions you have with your health care provider. Document Revised: 01/19/2019 Document Reviewed: 01/19/2019 Elsevier Patient Education  [place].

## 2020-07-24 ENCOUNTER — Encounter: Payer: Self-pay | Admitting: Family Medicine

## 2020-07-24 DIAGNOSIS — E782 Mixed hyperlipidemia: Secondary | ICD-10-CM | POA: Insufficient documentation

## 2020-07-24 DIAGNOSIS — I5032 Chronic diastolic (congestive) heart failure: Secondary | ICD-10-CM | POA: Insufficient documentation

## 2020-07-24 DIAGNOSIS — Z8673 Personal history of transient ischemic attack (TIA), and cerebral infarction without residual deficits: Secondary | ICD-10-CM | POA: Insufficient documentation

## 2020-07-24 DIAGNOSIS — N184 Chronic kidney disease, stage 4 (severe): Secondary | ICD-10-CM | POA: Insufficient documentation

## 2020-07-24 DIAGNOSIS — D631 Anemia in chronic kidney disease: Secondary | ICD-10-CM | POA: Insufficient documentation

## 2020-07-24 DIAGNOSIS — J309 Allergic rhinitis, unspecified: Secondary | ICD-10-CM | POA: Insufficient documentation

## 2020-07-24 DIAGNOSIS — R7303 Prediabetes: Secondary | ICD-10-CM | POA: Insufficient documentation

## 2020-07-24 DIAGNOSIS — I129 Hypertensive chronic kidney disease with stage 1 through stage 4 chronic kidney disease, or unspecified chronic kidney disease: Secondary | ICD-10-CM | POA: Insufficient documentation

## 2020-07-24 LAB — CBC WITH DIFFERENTIAL/PLATELET
Basophils Absolute: 0 x10E3/uL (ref 0.0–0.2)
Basos: 0 %
EOS (ABSOLUTE): 0.4 x10E3/uL (ref 0.0–0.4)
Eos: 5 %
Hematocrit: 33.1 % — ABNORMAL LOW (ref 34.0–46.6)
Hemoglobin: 10.7 g/dL — ABNORMAL LOW (ref 11.1–15.9)
Immature Grans (Abs): 0 x10E3/uL (ref 0.0–0.1)
Immature Granulocytes: 0 %
Lymphocytes Absolute: 2.5 x10E3/uL (ref 0.7–3.1)
Lymphs: 33 %
MCH: 26.7 pg (ref 26.6–33.0)
MCHC: 32.3 g/dL (ref 31.5–35.7)
MCV: 83 fL (ref 79–97)
Monocytes Absolute: 0.8 x10E3/uL (ref 0.1–0.9)
Monocytes: 10 %
Neutrophils Absolute: 4 x10E3/uL (ref 1.4–7.0)
Neutrophils: 52 %
Platelets: 327 x10E3/uL (ref 150–450)
RBC: 4.01 x10E6/uL (ref 3.77–5.28)
RDW: 13.1 % (ref 11.7–15.4)
WBC: 7.7 x10E3/uL (ref 3.4–10.8)

## 2020-07-24 LAB — BASIC METABOLIC PANEL WITH GFR
BUN/Creatinine Ratio: 17 (ref 12–28)
BUN: 33 mg/dL — ABNORMAL HIGH (ref 8–27)
CO2: 25 mmol/L (ref 20–29)
Calcium: 9.5 mg/dL (ref 8.7–10.3)
Chloride: 99 mmol/L (ref 96–106)
Creatinine, Ser: 2 mg/dL — ABNORMAL HIGH (ref 0.57–1.00)
GFR calc Af Amer: 26 mL/min/1.73 — ABNORMAL LOW
GFR calc non Af Amer: 23 mL/min/1.73 — ABNORMAL LOW
Glucose: 102 mg/dL — ABNORMAL HIGH (ref 65–99)
Potassium: 4.1 mmol/L (ref 3.5–5.2)
Sodium: 139 mmol/L (ref 134–144)

## 2020-07-24 LAB — MAGNESIUM: Magnesium: 2.1 mg/dL (ref 1.6–2.3)

## 2020-07-24 LAB — T4 AND TSH
T4, Total: 7.8 ug/dL (ref 4.5–12.0)
TSH: 3.94 u[IU]/mL (ref 0.450–4.500)

## 2020-07-25 ENCOUNTER — Encounter: Payer: Self-pay | Admitting: *Deleted

## 2020-07-28 NOTE — Progress Notes (Signed)
CARDIOLOGY OFFICE NOTE  Date:  08/04/2020    Jillian [name] Date of Birth: 01/17/1936 Medical Record #[id number]  PCP:  Antony Blackbird, MD  Cardiologist:  Harrington Challenger ([place])  Chief Complaint  Patient presents with  . Hospitalization Follow-up    Seen for Dr. Harrington Challenger    History of Present Illness: Jillian [name] is a 84 y.o. female who presents today for a post hospital visit. Seen for Dr. Harrington Challenger ([place]).   She has a history of CKD IV, HTN, CVA, HLD and obesity. Noted history of remote MI in 2009 - no details known.   She was seen by Dr. Harrington Challenger for evaluation of CHF and an elevated troponin after presenting with SOB/chest pain. Also with problems speaking - making history difficult to discern. Neurology was consulted as well.   She was felt to have demand ischemia in setting of CHF. She improved with diuresis. Noted numerous infarcts on scan - no AF noted while on telemetry - did have NSVT of 7 beats and a brief run of PAT. TEE deferred given her advanced age. Loop was recommended - family and patient declined.   Comes in today. Here with her daughter. She is in a wheelchair. History is hard to follow. Daughter helps augment the history. Patient is back home - daughter trying to oversee her medicines - seems to have missed one. Has wanted fast food - had last night from [employer]. Daughter notes "she has been sick with her BP". She has had recent increase in her BP medicines by PCP. She notes she felt very bad last night - hard to really discern why. Trouble passing bowels. Patient notes feeling "fluid all over her body" last night and feeling poorly.  Off antibiotics. She has been vaccinated for COVID.  She refused the ILR.   Past Medical History:  Diagnosis Date  . Hypertension   . Renal disorder    CKD Stage IV  . Stroke [facility])     Past Surgical History:  Procedure Laterality Date  . CARDIAC CATHETERIZATION  2009     Medications: Current Meds  Medication Sig  . amLODipine (NORVASC) 5  MG tablet Take 1 tablet (5 mg total) by mouth daily.  . Blood Pressure Monitor DEVI Use to monitor blood pressure daily  . cycloSPORINE (RESTASIS) 0.05 % ophthalmic emulsion Administer 1 Drop in both eyes 2 times daily.  . furosemide (LASIX) 40 MG tablet Take 1 tablet (40 mg total) by mouth 2 (two) times daily.  . hydrALAZINE (APRESOLINE) 50 MG tablet Take 1 tablet (50 mg total) by mouth 3 (three) times daily. To lower blood pressure  . labetalol (NORMODYNE) 200 MG tablet Take by mouth.  . loratadine (CLARITIN) 10 MG tablet Take 1 tablet (10 mg total) by mouth daily. As needed for nasal congestion  . nystatin (MYCOSTATIN/NYSTOP) powder Apply topically 3 (three) times daily.  . polyethylene glycol powder (GLYCOLAX/MIRALAX) 17 GM/SCOOP powder Add 17 gm to 16 ounces or more of fluid once per day as needed for constipation relief  . potassium chloride (KLOR-CON) 10 MEQ tablet Take 1 tablet (10 mEq total) by mouth daily.  . rosuvastatin (CRESTOR) 20 MG tablet Take 1 tablet (20 mg total) by mouth daily. To lower cholesterol  . triamterene-hydrochlorothiazide (MAXZIDE-25) 37.5-25 MG tablet Take 1 tablet by mouth daily.  . [DISCONTINUED] aspirin EC 81 MG tablet Take 1 tablet (81 mg total) by mouth daily. Swallow whole.  . [DISCONTINUED] carvedilol (COREG) 6.25 MG tablet One pill twice daily  with meals  . [DISCONTINUED] clopidogrel (PLAVIX) 75 MG tablet Take 1 tablet (75 mg total) by mouth daily for 21 days.     Allergies: Allergies  Allergen Reactions  . Penicillins Nausea Only    Social History: The patient  reports that she has never smoked. She has never used smokeless tobacco.   Family History: The patient's family history includes Breast cancer in her mother; CAD in her mother.   Review of Systems: Please see the history of present illness.   All other systems are reviewed and negative.   Physical Exam: VS:  BP 140/80 Comment: right arm 130/60  Pulse 85   Ht 5\' 1"  (1.549 m)   Wt 185  lb 3.2 oz (84 kg)   SpO2 96%   BMI 34.99 kg/m  .  BMI Body mass index is 34.99 kg/m.  Wt Readings from Last 3 Encounters:  08/04/20 185 lb 3.2 oz (84 kg)  07/23/20 186 lb (84.4 kg)  07/15/20 186 lb 3.2 oz (84.5 kg)    General: Alert. She is in a wheelchair. She came in a robe and pajamas. She is in no acute distress. History is hard to follow - little tangential.   Cardiac: Irregular and fast. Trace edema.  Respiratory:  Lungs are fairly clear to auscultation bilaterally with normal work of breathing.  GI: Soft and nontender.  MS: No deformity or atrophy. Gait not tested. She is in a wheelchair.  Skin: Warm and dry. Color is normal.  Neuro:  Strength and sensation are intact and no gross focal deficits noted.  Psych: Alert, appropriate and with normal affect.   LABORATORY DATA:  EKG:  EKG is not ordered today.    Lab Results  Component Value Date   WBC 7.7 07/23/2020   HGB 10.7 (L) 07/23/2020   HCT 33.1 (L) 07/23/2020   PLT 327 07/23/2020   GLUCOSE 102 (H) 07/23/2020   CHOL 116 07/13/2020   TRIG 80 07/13/2020   HDL 32 (L) 07/13/2020   LDLCALC 68 07/13/2020   ALT 27 07/09/2020   AST 27 07/09/2020   NA 139 07/23/2020   K 4.1 07/23/2020   CL 99 07/23/2020   CREATININE 2.00 (H) 07/23/2020   BUN 33 (H) 07/23/2020   CO2 25 07/23/2020   TSH 3.940 07/23/2020   HGBA1C 6.3 (H) 07/13/2020     BNP (last 3 results) Recent Labs    07/09/20 0018  BNP 285.1*    ProBNP (last 3 results) No results for input(s): PROBNP in the last 8760 hours.   Other Studies Reviewed Today:  Echo 07/09/20 1. Left ventricular ejection fraction, by estimation, is 55 to 60%. The  left ventricle has normal function. The left ventricle has no regional  wall motion abnormalities. There is mild concentric left ventricular  hypertrophy. Indeterminate diastolic  filling due to E-A fusion.  2. Right ventricular systolic function is normal. The right ventricular  size is normal. Tricuspid  regurgitation signal is inadequate for assessing  PA pressure.  3. The mitral valve is degenerative. Mild mitral valve regurgitation.  4. The aortic valve is tricuspid. Aortic valve regurgitation is not  visualized. Mild aortic valve sclerosis is present, with no evidence of  aortic valve stenosis.  5. The inferior vena cava is normal in size with greater than 50%  respiratory variability, suggesting right atrial pressure of 3 mmHg.   MR ANGIO HEAD WO CONTRAST 07/13/2020 IMPRESSION:  Motion degraded. No proximal intracranial vessel occlusion or definite significant stenosis.  MR BRAIN WO CONTRAST 07/11/2020 IMPRESSION:  1. Numerous acute infarctions scattered within the right hemisphere within the right middle cerebral artery territory. Mild swelling but no hemorrhage or mass effect.  2. Extensive old ischemic changes elsewhere throughout the brain as outlined above.   CT Head WO Contrast 07/09/20 IMPRESSION: 1. No acute intracranial hemorrhage. 2. Moderate age-related atrophy and chronic microvascular ischemic changes. Areas of old infarct.  VAS US CAROTID 07/13/2020 Summary:  Right Carotid: The extracranial vessels were near-normal with only minimal wall  thickening or plaque.  Left Carotid: The extracranial vessels were near-normal with only minimal wall thickening or plaque.  Vertebrals:  Bilateral vertebral arteries demonstrate antegrade flow.  Subclavians: Normal flow hemodynamics were seen in bilateral subclavian arteries.  Final    Transthoracic Echocardiogram  Ejection fraction 55 to 60%.  No cardiac source embolism.    Assessment and Plan:   1. NSTEMI - demand ischemia in setting of CHF - would favor medical management.   2. Acute on chronic diastolic HF - to manage medically - seems to have excess salt - this will make management difficult.   3. Recurrent infarcts - appeared embolic - on chronic Plavix - she declined Loop implant - echo was unremarkable.  No TEE given her age and expected low yield. She is back at home.   4. CKD IV - consider Nephrology referral - daughter notes this is in process.   5. HTN - Losartan was held due to her CKD - on Norvasc and Hydralazine - BP fair here today. Has had recent increase in her medicines - would follow.   6. Elevated HR - she is in AF with RVR on EKG today - she had declined ILR (and now not needed) - she is on Plavix. Will change to Eliquis 2.5 mg BID. Plavix and aspirin stopped today. Coreg is increased to 12.5 mg BID to get better control. I do not think she would comply with a monitor at this time. Needs labs today and again in one month. My concern is medication compliance.   Current medicines are reviewed with the patient today.  The patient does not have concerns regarding medicines other than what has been noted above.  The following changes have been made:  See above.  Labs/ tests ordered today include:    Orders Placed This Encounter  Procedures  . Basic metabolic panel  . CBC  . EKG 12-Lead     Disposition:   FU with Dr. Harrington Challenger in 2 weeks.  Needs EKG on return.    Patient is agreeable to this plan and will call if any problems develop in the interim.   SignedTruitt Merle, NP  08/04/2020 12:40 PM  [place] [address] [place], [place]  [address] Phone: [phone] Fax: [phone]

## 2020-07-29 ENCOUNTER — Telehealth: Payer: Self-pay | Admitting: Family Medicine

## 2020-07-29 NOTE — Telephone Encounter (Signed)
Verbal orders for home health and PT - 1x wk 1, 2x wk2,1x wk 1,  CB# [phone]

## 2020-07-29 NOTE — Telephone Encounter (Signed)
Called and LM w/ VO on the confidential VM of PT, advised to contact clinic w/ any further needs

## 2020-08-04 ENCOUNTER — Other Ambulatory Visit: Payer: Self-pay

## 2020-08-04 ENCOUNTER — Encounter: Payer: Self-pay | Admitting: Nurse Practitioner

## 2020-08-04 ENCOUNTER — Ambulatory Visit (INDEPENDENT_AMBULATORY_CARE_PROVIDER_SITE_OTHER): Payer: Medicare Other | Admitting: Nurse Practitioner

## 2020-08-04 VITALS — BP 140/80 | HR 85 | Ht 61.0 in | Wt 185.2 lb

## 2020-08-04 DIAGNOSIS — R000 Tachycardia, unspecified: Secondary | ICD-10-CM | POA: Diagnosis not present

## 2020-08-04 DIAGNOSIS — I5032 Chronic diastolic (congestive) heart failure: Secondary | ICD-10-CM

## 2020-08-04 DIAGNOSIS — I4891 Unspecified atrial fibrillation: Secondary | ICD-10-CM

## 2020-08-04 DIAGNOSIS — I639 Cerebral infarction, unspecified: Secondary | ICD-10-CM | POA: Diagnosis not present

## 2020-08-04 DIAGNOSIS — Z79899 Other long term (current) drug therapy: Secondary | ICD-10-CM

## 2020-08-04 LAB — BASIC METABOLIC PANEL
BUN/Creatinine Ratio: 15 (ref 12–28)
BUN: 31 mg/dL — ABNORMAL HIGH (ref 8–27)
CO2: 24 mmol/L (ref 20–29)
Calcium: 9.3 mg/dL (ref 8.7–10.3)
Chloride: 101 mmol/L (ref 96–106)
Creatinine, Ser: 2.04 mg/dL — ABNORMAL HIGH (ref 0.57–1.00)
GFR calc Af Amer: 26 mL/min/{1.73_m2} — ABNORMAL LOW (ref >59)
GFR calc non Af Amer: 22 mL/min/{1.73_m2} — ABNORMAL LOW (ref >59)
Glucose: 103 mg/dL — ABNORMAL HIGH (ref 65–99)
Potassium: 3.4 mmol/L — ABNORMAL LOW (ref 3.5–5.2)
Sodium: 139 mmol/L (ref 134–144)

## 2020-08-04 LAB — CBC
Hematocrit: 33.9 % — ABNORMAL LOW (ref 34.0–46.6)
Hemoglobin: 10.8 g/dL — ABNORMAL LOW (ref 11.1–15.9)
MCH: 26.9 pg (ref 26.6–33.0)
MCHC: 31.9 g/dL (ref 31.5–35.7)
MCV: 85 fL (ref 79–97)
Platelets: 217 10*3/uL (ref 150–450)
RBC: 4.01 x10E6/uL (ref 3.77–5.28)
RDW: 13 % (ref 11.7–15.4)
WBC: 8.3 10*3/uL (ref 3.4–10.8)

## 2020-08-04 MED ORDER — APIXABAN 2.5 MG PO TABS: 2.5000 mg | ORAL_TABLET | Freq: Two times a day (BID) | ORAL | 6 refills | Status: DC

## 2020-08-04 MED ORDER — CARVEDILOL 12.5 MG PO TABS: 12.5000 mg | ORAL_TABLET | Freq: Two times a day (BID) | ORAL | 3 refills | Status: DC

## 2020-08-04 NOTE — Patient Instructions (Addendum)
After Visit Summary:  We will be checking the following labs today - BMET & CBC   Medication Instructions:    Continue with your current medicines. BUT   STOP Plavix  STOP aspirin  Increase the Coreg to 12.5 mg twice a day  START Eliquis 2.5 mg twice a day   If you need a refill on your cardiac medications before your next appointment, please call your pharmacy.     Testing/Procedures To Be Arranged:  N/A  Follow-Up:  See Dr. Harrington Challenger in about a week with EKG   At [facility], you and your health needs are our priority.  As part of our continuing mission to provide you with exceptional heart care, we have created designated Provider Care Teams.  These Care Teams include your primary Cardiologist (physician) and Advanced Practice Providers (APPs -  Physician Assistants and Nurse Practitioners) who all work together to provide you with the care you need, when you need it.  Special Instructions:  . Stay safe, wash your hands for at least 20 seconds and wear a mask when needed.  . It was good to talk with you today.    Call the [place] office at [phone] if you have any questions, problems or concerns.

## 2020-08-06 ENCOUNTER — Other Ambulatory Visit: Payer: Self-pay | Admitting: *Deleted

## 2020-08-15 ENCOUNTER — Ambulatory Visit (INDEPENDENT_AMBULATORY_CARE_PROVIDER_SITE_OTHER): Payer: Medicare Other | Admitting: Internal Medicine

## 2020-08-15 ENCOUNTER — Other Ambulatory Visit: Payer: Self-pay

## 2020-08-15 ENCOUNTER — Encounter: Payer: Self-pay | Admitting: Internal Medicine

## 2020-08-15 DIAGNOSIS — I5032 Chronic diastolic (congestive) heart failure: Secondary | ICD-10-CM | POA: Diagnosis not present

## 2020-08-15 DIAGNOSIS — I639 Cerebral infarction, unspecified: Secondary | ICD-10-CM

## 2020-08-15 MED ORDER — POTASSIUM CHLORIDE CRYS ER 10 MEQ PO TBCR: 20.0000 meq | EXTENDED_RELEASE_TABLET | Freq: Every day | ORAL | 3 refills | Status: DC

## 2020-08-15 MED ORDER — FUROSEMIDE 40 MG PO TABS: 60.0000 mg | ORAL_TABLET | Freq: Every day | ORAL | 3 refills | Status: DC

## 2020-08-15 NOTE — Patient Instructions (Addendum)
Medication Instructions:  Your physician has recommended you make the following change in your medication:  1.) decrease furosemide (Lasix) to 60 mg -daily 2.) change potassium to 20 meq -daily  *If you need a refill on your cardiac medications before your next appointment, please call your pharmacy*   Lab Work: On 08/25/20 --please return for blood work (BMET, BNP)  Testing/Procedures: none   Follow-Up: At [facility], you and your health needs are our priority.  As part of our continuing mission to provide you with exceptional heart care, we have created designated Provider Care Teams.  These Care Teams include your primary Cardiologist (physician) and Advanced Practice Providers (APPs -  Physician Assistants and Nurse Practitioners) who all work together to provide you with the care you need, when you need it.  We recommend signing up for the patient portal called "MyChart".  Sign up information is provided on this After Visit Summary.  MyChart is used to connect with patients for Virtual Visits (Telemedicine).  Patients are able to view lab/test results, encounter notes, upcoming appointments, etc.  Non-urgent messages can be sent to your provider as well.   To learn more about what you can do with MyChart, go to [email or web address].    Your next appointment:   2 month(s)  The format for your next appointment:   In Person  Provider:   You may see Dorris Carnes, MD or one of the following Advanced Practice Providers on your designated Care Team:    Richardson Dopp, PA-C  Robbie Lis, [place]    Other Instructions

## 2020-08-15 NOTE — Progress Notes (Signed)
Cardiology Office Note   Date:  08/15/2020   ID:  Jillian [name], DOB 09-20-1936, MRN [id number]  PCP:  Antony Blackbird, MD  Cardiologist:   Dorris Carnes, MD   Pt presents for f/u of CHF    History of Present Illness: Jillian [name] is a 84 y.o. female with hx of HTN, CKD, CVA, HL  I saw her in the hospital in consult in Aug 2021  She presented with SOB/ chest tightness.  Troponin elevated, felt due to demand    Echo showed LVEF 55 to 60%  She diuresed    The pt was seen by EP on that admit given hx of CVA   She declined implant of loop recorder   The pt was seen by L Gerhardt on 9.20  BP noted to be high at times   Complained of swelling   Complained of feeling poorly  At that clinic visit she was found to be in afib   Eliquis was added and Plavix/ASA stopped  Coreg increased   SInce seen the pt's breathing is OK   Still complains of some  some swelling   Denies palpitaitons          Current Meds  Medication Sig   amLODipine (NORVASC) 5 MG tablet Take 1 tablet (5 mg total) by mouth daily.   apixaban (ELIQUIS) 2.5 MG TABS tablet Take 1 tablet (2.5 mg total) by mouth 2 (two) times daily.   carvedilol (COREG) 12.5 MG tablet Take 1 tablet (12.5 mg total) by mouth 2 (two) times daily.   clopidogrel (PLAVIX) 75 MG tablet Take 75 mg by mouth daily.   furosemide (LASIX) 40 MG tablet Take 1 tablet (40 mg total) by mouth 2 (two) times daily.   hydrALAZINE (APRESOLINE) 50 MG tablet Take 1 tablet (50 mg total) by mouth 3 (three) times daily. To lower blood pressure   loratadine (CLARITIN) 10 MG tablet Take 1 tablet (10 mg total) by mouth daily. As needed for nasal congestion   potassium chloride (KLOR-CON) 10 MEQ tablet Take 1 tablet (10 mEq total) by mouth daily.   rosuvastatin (CRESTOR) 20 MG tablet Take 1 tablet (20 mg total) by mouth daily. To lower cholesterol     Allergies:   Penicillins, Aspirin, Atorvastatin, and Diltiazem hcl   Past Medical History:  Diagnosis Date    Hypertension    Renal disorder    CKD Stage IV   Stroke [facility])     Past Surgical History:  Procedure Laterality Date   CARDIAC CATHETERIZATION  2009     Social History:  The patient  reports that she has never smoked. She has never used smokeless tobacco.   Family History:  The patient's family history includes Breast cancer in her mother; CAD in her mother.    ROS:  Please see the history of present illness. All other systems are reviewed and  Negative to the above problem except as noted.    PHYSICAL EXAM: VS:  BP (!) 148/80    Pulse 77    Ht 5\' 1"  (1.549 m)    Wt 185 lb 3.2 oz (84 kg)    SpO2 97%    BMI 34.99 kg/m   GEN: Obese 84 yo in NAD   HEENT: normal  Neck: no JVD, carotid bruits Cardiac: RRR; no murmurs  Tr LE edema  Respiratory:  clear to auscultation bilaterally,  GI: soft, nontender, nondistended, + BS  MS: no deformity Moving all extremities   Skin: warm and  dry, no rash Neuro:  Deferred   EKG:  EKG is ordered today.  SR 77 bpm  LVH with repol abnormaltiy     Lipid Panel    Component Value Date/Time   CHOL 116 07/13/2020 0459   TRIG 80 07/13/2020 0459   HDL 32 (L) 07/13/2020 0459   CHOLHDL 3.6 07/13/2020 0459   VLDL 16 07/13/2020 0459   LDLCALC 68 07/13/2020 0459      Wt Readings from Last 3 Encounters:  08/15/20 185 lb 3.2 oz (84 kg)  08/04/20 185 lb 3.2 oz (84 kg)  07/23/20 186 lb (84.4 kg)      ASSESSMENT AND PLAN:  1  Acute on chronic diastolic CHF   Will switch lasix to 60 mg am  May improve diuresis and minimize going to BR at ight .  Switch KCL to 20 mEq per day  Check BMET and BNP in 10 days    2  PAF   PT is back on SR   Currently on coreg and Eliquis  Continue   3  HTN   BP is mildly increased today  Follow for now   Cotinue meds with changes as noted    4  CKD   F/U BMET in 10 days    Need to confirm Plavix use  Will review  Pobably d/c     Current medicines are reviewed at length with the patient today.  The patient does  not have concerns regarding medicines.  Signed, Dorris Carnes, MD  08/15/2020 2:22 PM    [facility] [place], [place], [place]  [address] Phone: [phone]; Fax: [phone]

## 2020-08-20 ENCOUNTER — Ambulatory Visit (INDEPENDENT_AMBULATORY_CARE_PROVIDER_SITE_OTHER): Payer: Medicare Other | Admitting: Adult Health

## 2020-08-20 ENCOUNTER — Other Ambulatory Visit: Payer: Self-pay

## 2020-08-20 ENCOUNTER — Encounter: Payer: Self-pay | Admitting: Adult Health

## 2020-08-20 VITALS — BP 124/59 | HR 49

## 2020-08-20 DIAGNOSIS — I63411 Cerebral infarction due to embolism of right middle cerebral artery: Secondary | ICD-10-CM | POA: Diagnosis not present

## 2020-08-20 DIAGNOSIS — I10 Essential (primary) hypertension: Secondary | ICD-10-CM

## 2020-08-20 DIAGNOSIS — I480 Paroxysmal atrial fibrillation: Secondary | ICD-10-CM

## 2020-08-20 DIAGNOSIS — E785 Hyperlipidemia, unspecified: Secondary | ICD-10-CM | POA: Diagnosis not present

## 2020-08-20 NOTE — Progress Notes (Signed)
I agree with the above plan 

## 2020-08-20 NOTE — Patient Instructions (Signed)
Continue Eliquis (apixaban) daily  and crestor  for secondary stroke prevention  No indication for plavix from a stroke standpoint - please follow up with cardiology for ongoing need  Continue to follow up with PCP regarding cholesterol and blood pressure management  Maintain strict control of hypertension with blood pressure goal below 130/90 and cholesterol with LDL cholesterol (bad cholesterol) goal below 70 mg/dL.     Followup in the future with me in 4 months or call earlier if needed       Thank you for coming to see [place] at [facility]. I hope we have been able to provide you high quality care today.  You may receive a patient satisfaction survey over the next few weeks. We would appreciate your feedback and comments so that we may continue to improve ourselves and the health of our patients.

## 2020-08-20 NOTE — Progress Notes (Signed)
Jillian [name] is a 84 y.o. female here withher daughter [name]. Pt fell on a toy Oct 2. Pts daughter days she has slurred speech and drooping on one side of her face. Her sx resided by Sunday.

## 2020-08-20 NOTE — Progress Notes (Signed)
[facility] [address] [place]. [place] [address] [phone]       HOSPITAL FOLLOW UP NOTE  [name] Date of Birth:  19-Sep-1936 Medical Record Number:  [id number]   Reason for Referral:  hospital stroke follow up    SUBJECTIVE:   CHIEF COMPLAINT:  Chief Complaint  Patient presents with  . Follow-up  . Cerebrovascular Accident    pt fell in a toy Oct 2. Pts daughter days she has slurred pe    HPI:   [name] is a 84 y.o. female with history of HTN, HLD, CHF, prior stroke (per patient-no residual deficits), CKD 4, and obesity who presented to the hospital for shortness of breath on 07/09/2020. The pt was brought in by EMS - had breathing difficulties en route - received a nebulizer treatment and CPAP then became hypertensive - SBP 210. Shortly thereafter developed speech difficulties.  Stroke work-up revealed right MCA territory infarcts, embolic secondary to unknown source.  TEE not recommended given advanced age and low yield.  Recommended placement of loop recorder to assess for possible atrial fibrillation but patient and daughter declined and advised to follow-up with cardiology outpatient to discuss further.  Initiated aspirin and Brilinta; Plavix PTA.  Hypertensive emergency upon arrival with SBP 210 stabilize during admission and resumed home meds of amlodipine and carvedilol.  LDL 68 and continued home dose Crestor 20 mg daily.  Other stroke risk factors include advanced age, prior history of stroke and history of CHF. Evaluated by therapy and recommended discharge to SNF for ongoing therapy needs but patient and family declined SNF placement and discharged home with recommendation of home health therapy  Stroke: Rt MCA territory infarcts - embolic - source unknown.  Code Stroke CT Head - not ordered  CT head -  No acute intracranial hemorrhage. Moderate age-related atrophy and chronic microvascular ischemic changes. Areas of old  infarct.  MRI head - Numerous acute infarctions scattered within the right hemisphere within the right middle cerebral artery territory.  MRA head - Motion degraded. No proximal intracranial vessel occlusion or definite significant stenosis.   Carotid Doppler - unremarkable  2D Echo -normal ejection fraction 55 to 60%.  No cardiac source embolism.    Will not pursue TEE in this 84yo female given low yield and test risk  Recommend loop to look for AF as source of stroke. EP has already met with she and her daughter which decline loop at this time. They will follow up w ith Dr. Harrington Challenger in 2 weeks and can discuss further.   [employer] Virus 2 - negative  LDL - 68  HgbA1c - 6.3  UDS - not ordered  VTE prophylaxis - [place] Heparine  clopidogrel 75 mg daily prior to admission, now on aspirin 325 mg daily and clopidogrel 75 mg daily/ change to asprin and brilinta   Patient counseled to be compliant with her antithrombotic medications  Ongoing aggressive stroke risk factor management  Therapy recommendations:  SNF recommended --> patient and family declined  Disposition:  Home  Today, 08/20/2020, [name] is being seen for hospital follow-up accompanied by her daughter and grandson.  Per daughter, she initially recovered well without residual deficits but had a fall on Saturday after stepping on her grandsons toy and had worsening slurred speech and left facial weakness until the following morning returning back to baseline.  Denies head injury or any other physical injury.  Denies loss of balance or extremity weakness contributing to fall.  No residual deficits at this time.  Denies new stroke/TIA symptoms. At follow-up visit with cardiology, she was found to be in atrial fibrillation and initiated Eliquis with discontinuing aspirin but has continued on Plavix.  Currently remains on Eliquis and Plavix without bleeding or bruising.  Remains on Crestor without myalgias.  Blood pressure today  124/59. Monitors at home and typically 140s.  No concerns at this time.   ROS:   14 system review of systems performed and negative with exception of those listed in HPI  PMH:  Past Medical History:  Diagnosis Date  . Hypertension   . Renal disorder    CKD Stage IV  . Stroke [facility])     PSH:  Past Surgical History:  Procedure Laterality Date  . CARDIAC CATHETERIZATION  2009    Social History:  Social History   Socioeconomic History  . Marital status: Unknown    Spouse name: Not on file  . Number of children: Not on file  . Years of education: Not on file  . Highest education level: Not on file  Occupational History  . Not on file  Tobacco Use  . Smoking status: Never Smoker  . Smokeless tobacco: Never Used  Substance and Sexual Activity  . Alcohol use: Not on file  . Drug use: Not on file  . Sexual activity: Not on file  Other Topics Concern  . Not on file  Social History Narrative  . Not on file   Social Determinants of Health   Financial Resource Strain:   . Difficulty of Paying Living Expenses: Not on file  Food Insecurity:   . Worried About Charity fundraiser in the Last Year: Not on file  . Ran Out of Food in the Last Year: Not on file  Transportation Needs:   . Lack of Transportation (Medical): Not on file  . Lack of Transportation (Non-Medical): Not on file  Physical Activity:   . Days of Exercise per Week: Not on file  . Minutes of Exercise per Session: Not on file  Stress:   . Feeling of Stress : Not on file  Social Connections:   . Frequency of Communication with Friends and Family: Not on file  . Frequency of Social Gatherings with Friends and Family: Not on file  . Attends Religious Services: Not on file  . Active Member of Clubs or Organizations: Not on file  . Attends Archivist Meetings: Not on file  . Marital Status: Not on file  Intimate Partner Violence:   . Fear of Current or Ex-Partner: Not on file  . Emotionally Abused:  Not on file  . Physically Abused: Not on file  . Sexually Abused: Not on file    Family History:  Family History  Problem Relation Age of Onset  . CAD Mother   . Breast cancer Mother     Medications:   Current Outpatient Medications on File Prior to Visit  Medication Sig Dispense Refill  . amLODipine (NORVASC) 5 MG tablet Take 1 tablet (5 mg total) by mouth daily. 90 tablet 1  . apixaban (ELIQUIS) 2.5 MG TABS tablet Take 1 tablet (2.5 mg total) by mouth 2 (two) times daily. 60 tablet 6  . carvedilol (COREG) 12.5 MG tablet Take 1 tablet (12.5 mg total) by mouth 2 (two) times daily. 180 tablet 3  . clopidogrel (PLAVIX) 75 MG tablet Take 75 mg by mouth daily.    . furosemide (LASIX) 40 MG tablet Take 1.5 tablets (60  mg total) by mouth daily. 135 tablet 3  . hydrALAZINE (APRESOLINE) 50 MG tablet Take 1 tablet (50 mg total) by mouth 3 (three) times daily. To lower blood pressure 270 tablet 1  . potassium chloride (KLOR-CON) 10 MEQ tablet Take 2 tablets (20 mEq total) by mouth daily. 180 tablet 3  . rosuvastatin (CRESTOR) 20 MG tablet Take 1 tablet (20 mg total) by mouth daily. To lower cholesterol 90 tablet 3   No current facility-administered medications on file prior to visit.    Allergies:   Allergies  Allergen Reactions  . Penicillins Nausea Only  . Aspirin Other (See Comments)  . Atorvastatin Other (See Comments)    fatigue  . Diltiazem Hcl Nausea And Vomiting      OBJECTIVE:  Physical Exam  Vitals:   08/20/20 0859  BP: (!) 124/59  Pulse: (!) 49   There is no height or weight on file to calculate BMI. No exam data present  General: well developed, well nourished,  frail pleasant elderly African female, seated, in no evident distress Head: head normocephalic and atraumatic.   Neck: supple with no carotid or supraclavicular bruits Cardiovascular: regular rate and rhythm, no murmurs Musculoskeletal: no deformity Skin:  no rash/petichiae Vascular:  Normal pulses  all extremities   Neurologic Exam Mental Status: Awake and fully alert.   Mild dysarthria (poor denture vs baseline).  Oriented to place and time. Recent and remote memory intact. Attention span, concentration and fund of knowledge appropriate. Mood and affect appropriate.  Cranial Nerves: Fundoscopic exam reveals sharp disc margins. Pupils equal, briskly reactive to light. Extraocular movements full without nystagmus. Visual fields full to confrontation. Hearing intact. Facial sensation intact. Face, tongue, palate moves normally and symmetrically.  Motor: Normal bulk and tone. Normal strength in all tested extremity muscles. Sensory.: intact to touch , pinprick , position and vibratory sensation.  Coordination: Rapid alternating movements normal in all extremities. Finger-to-nose and heel-to-shin performed accurately bilaterally. Gait and Station: Deferred as patient currently sitting in wheelchair Reflexes: 1+ and symmetric. Toes downgoing.     NIHSS  1 Modified Rankin  1      ASSESSMENT/PLAN: Jillian [name] is a 84 y.o. year old female presented to hospital initially with shortness of breath on 8/25 becoming hypertensive with SBP 210 and shortly thereafter developed speech difficulties.  Stroke work-up revealed right MCA territory infarcts, embolic secondary to unknown source.  Initially declined loop recorder placement but at cardiology follow-up outpatient, she was found to be in atrial fibrillation and Eliquis initiated.  Vascular risk factors include new dx of atrial fibrillation, HTN, HLD, prior stroke and history of CHF.     1. Right MCA stroke:  a. Residual deficit: Mild dysarthria but per patient and daughter, baseline.   b. Recent worsening of symptoms post fall denying head injury and resolved by the next day.  No reoccurrence of worsening symptoms.  Possibly recrudescence of prior stroke symptoms post fall vs extension of stroke vs acute stroke.  Further discussed with  daughter patient and agreed to hold off on further evaluation at this time as her current treatment plan would not change.  They were advised to call 911 immediately if she experiences any significant worsening of symptoms or new stroke/TIA symptoms.  No indication at this time for further evaluation as she is currently back to baseline and current treatment plan would likely not change.   c. Continue Eliquis (apixaban) daily  and Crestor for secondary stroke prevention.  She is also currently  on Plavix which is not indicated from a stroke standpoint and currently continued by cardiology.  Advised patient to further discuss ongoing need of Plavix with cardiology d. Discussed secondary stroke prevention measures and close PCP follow up for aggressive stroke risk factor management  2. Atrial fibrillation, new dx: a. Likely stroke etiology b. CHA2DS2-VASc score 7 c. Continue Eliquis per cardiology 3. HTN: BP goal <130/90.  Stable today.  Continue f/u with PCP 4. HLD: LDL goal <70. Recent LDL 68.  On Crestor 20 mg daily per PCP     Follow up in 4 months or call earlier if needed   I spent 45 minutes of face-to-face and non-face-to-face time with patient and daughter.  This included previsit chart review, lab review, study review, order entry, electronic health record documentation, patient education regarding recent stroke, recent recurrence of stroke symptoms, importance of managing stroke risk factors and answered all questions to patient satisfaction   Frann Rider, [facility]  [facility] [address] [place], [place] [address]  Phone [phone] Fax [phone] Note: This document was prepared with digital dictation and possible smart phrase technology. Any transcriptional errors that result from this process are unintentional.

## 2020-08-28 ENCOUNTER — Other Ambulatory Visit: Payer: Self-pay

## 2020-08-28 ENCOUNTER — Other Ambulatory Visit: Payer: Medicare Other | Admitting: *Deleted

## 2020-08-28 DIAGNOSIS — I5032 Chronic diastolic (congestive) heart failure: Secondary | ICD-10-CM

## 2020-08-28 LAB — PRO B NATRIURETIC PEPTIDE: NT-Pro BNP: 930 pg/mL — ABNORMAL HIGH (ref 0–738)

## 2020-08-28 LAB — BASIC METABOLIC PANEL
BUN/Creatinine Ratio: 13 (ref 12–28)
BUN: 24 mg/dL (ref 8–27)
CO2: 24 mmol/L (ref 20–29)
Calcium: 9.4 mg/dL (ref 8.7–10.3)
Chloride: 103 mmol/L (ref 96–106)
Creatinine, Ser: 1.87 mg/dL — ABNORMAL HIGH (ref 0.57–1.00)
GFR calc Af Amer: 28 mL/min/{1.73_m2} — ABNORMAL LOW (ref >59)
GFR calc non Af Amer: 25 mL/min/{1.73_m2} — ABNORMAL LOW (ref >59)
Glucose: 97 mg/dL (ref 65–99)
Potassium: 3.7 mmol/L (ref 3.5–5.2)
Sodium: 138 mmol/L (ref 134–144)

## 2020-09-04 ENCOUNTER — Telehealth: Payer: Self-pay | Admitting: *Deleted

## 2020-09-04 ENCOUNTER — Other Ambulatory Visit: Payer: Self-pay | Admitting: Internal Medicine

## 2020-09-04 NOTE — Telephone Encounter (Signed)
-----   Message from [place], MD sent at 08/29/2020 10:40 PM EDT ----- Electrolytes are OK   Kidney function is a little better Fluid is up a little  I recomm switching lasix to daily 60    Keep on this if wts stable, no signif swelling

## 2020-09-04 NOTE — Telephone Encounter (Signed)
Spoke w daughter and gave lab results. Pt saw her kidney doctor 2 days ago. Hydralazine was stopped. She started another BP med and daily iron.  Daughter not sure what the medication is.  Pt has follow up in Dec w Dr. Harrington Challenger. Continues on Lasix 60 mg daily.  Weights stable.  No signif swelling.

## 2020-09-05 ENCOUNTER — Other Ambulatory Visit: Payer: Self-pay | Admitting: Internal Medicine

## 2020-09-05 DIAGNOSIS — N184 Chronic kidney disease, stage 4 (severe): Secondary | ICD-10-CM

## 2020-09-05 DIAGNOSIS — I129 Hypertensive chronic kidney disease with stage 1 through stage 4 chronic kidney disease, or unspecified chronic kidney disease: Secondary | ICD-10-CM

## 2020-09-08 ENCOUNTER — Telehealth: Payer: Self-pay | Admitting: Internal Medicine

## 2020-09-08 ENCOUNTER — Ambulatory Visit: Payer: Self-pay | Admitting: *Deleted

## 2020-09-08 ENCOUNTER — Telehealth: Payer: Self-pay | Admitting: Family Medicine

## 2020-09-08 NOTE — Telephone Encounter (Signed)
See triage note.

## 2020-09-08 NOTE — Telephone Encounter (Signed)
Copied from [place] [id number]. Topic: General - Other >> Sep 08, 2020 11:23 AM Yvette Rack wrote: Reason for CRM: Pt granddaughter stated pt complains of fluid build up in her chest and back but she currently is not with the pt. Cb# [phone]

## 2020-09-08 NOTE — Telephone Encounter (Signed)
Spoke with pt's daughter (DPR). 4 days ago started to not be able to lay flat in bed.  SOB, pressure in back and chest.  Being propped up in chair is more comfortable. No weight changes noted. Taking lasix 60 mg daily. No cough.  No SOB other than as noted above. HRs 55-60 using BP cuff and pulse ox. BPs 125/64-176/64 No pulse ox available for this call.  Scheduled follow up in office tomorrow with APP.  Pt's daughter appreciative for the call/appointment.

## 2020-09-08 NOTE — Telephone Encounter (Signed)
Patients daughter calling in due to patient having "fluiod build up in her back & lungs". She states the patient has difficulty lying down without getting SOB or feeling pressure on her back and states she has been side sleeping instead to get some relief.   Denies any weight gain or lower extremity swelling out of the norm, denies patient is SOB except when lying down. States patient has been taking the prescribed 60 mg Lasix.   Will route to Dr. Harrington Challenger for advisement.

## 2020-09-08 NOTE — Telephone Encounter (Signed)
Patient called and said that her primary care has been trying to call us to see if we can get to see her today. Have fluid building up in her lungs and back. Please call back to discuss

## 2020-09-08 NOTE — Telephone Encounter (Signed)
  Patient with CHF- feels it is getting worse.Patient reports increased pressure and feels like she is retaining fluid in chest. Reason for Disposition . [1] Longstanding difficulty breathing AND [2] not responding to usual therapy  Answer Assessment - Initial Assessment Questions 1. RESPIRATORY STATUS: "Describe your breathing?" (e.g., wheezing, shortness of breath, unable to speak, severe coughing)      fluid effects breathing at night and when she exerts herself 2. ONSET: "When did this breathing problem begin?"      Patient reports she has been this way ever since she hascome home from the hospital- feels it is getting worse 3. PATTERN "Does the difficult breathing come and go, or has it been constant since it started?"      Comes and goes- and feels mainly at night 4. SEVERITY: "How bad is your breathing?" (e.g., mild, moderate, severe)    - MILD: No SOB at rest, mild SOB with walking, speaks normally in sentences, can lay down, no retractions, pulse < 100.    - MODERATE: SOB at rest, SOB with minimal exertion and prefers to sit, cannot lie down flat, speaks in phrases, mild retractions, audible wheezing, pulse 100-120.    - SEVERE: Very SOB at rest, speaks in single words, struggling to breathe, sitting hunched forward, retractions, pulse > 120      Moderate- rest in the chair- BP 139/76 average, P 55/60 5. RECURRENT SYMPTOM: "Have you had difficulty breathing before?" If Yes, ask: "When was the last time?" and "What happened that time?"     Yes- when she went I  hospital 6. CARDIAC HISTORY: "Do you have any history of heart disease?" (e.g., heart attack, angina, bypass surgery, angioplasty)      CHF 7. LUNG HISTORY: "Do you have any history of lung disease?"  (e.g., pulmonary embolus, asthma, emphysema)    no 8. CAUSE: "What do you think is causing the breathing problem?"      fluid retention- patient is using lasix prescribed 9. OTHER SYMPTOMS: "Do you have any other symptoms? (e.g.,  dizziness, runny nose, cough, chest pain, fever)     no 10. PREGNANCY: "Is there any chance you are pregnant?" "When was your last menstrual period?"       n/a 11. TRAVEL: "Have you traveled out of the country in the last month?" (e.g., travel history, exposures)       n/a  Protocols used: BREATHING DIFFICULTY-A-AH

## 2020-09-08 NOTE — Telephone Encounter (Signed)
See if spot with L Gerhardt to reassess volume   ? Afib with RVR?

## 2020-09-08 NOTE — Telephone Encounter (Signed)
Patient needs to go to emergency department for further evaluation

## 2020-09-09 ENCOUNTER — Ambulatory Visit
Admission: RE | Admit: 2020-09-09 | Discharge: 2020-09-09 | Disposition: A | Discharge status: Home / Self Care | Payer: Medicare Other | Source: Ambulatory Visit | Attending: Nurse Practitioner | Admitting: Nurse Practitioner

## 2020-09-09 ENCOUNTER — Encounter: Payer: Self-pay | Admitting: Nurse Practitioner

## 2020-09-09 ENCOUNTER — Other Ambulatory Visit: Payer: Self-pay

## 2020-09-09 ENCOUNTER — Ambulatory Visit (INDEPENDENT_AMBULATORY_CARE_PROVIDER_SITE_OTHER): Payer: Medicare Other | Admitting: Nurse Practitioner

## 2020-09-09 VITALS — BP 130/62 | HR 56 | Ht 61.0 in | Wt 183.2 lb

## 2020-09-09 DIAGNOSIS — I480 Paroxysmal atrial fibrillation: Secondary | ICD-10-CM | POA: Diagnosis not present

## 2020-09-09 DIAGNOSIS — I5032 Chronic diastolic (congestive) heart failure: Secondary | ICD-10-CM

## 2020-09-09 DIAGNOSIS — Z79899 Other long term (current) drug therapy: Secondary | ICD-10-CM

## 2020-09-09 DIAGNOSIS — I639 Cerebral infarction, unspecified: Secondary | ICD-10-CM | POA: Diagnosis not present

## 2020-09-09 DIAGNOSIS — R000 Tachycardia, unspecified: Secondary | ICD-10-CM | POA: Diagnosis not present

## 2020-09-09 DIAGNOSIS — R0602 Shortness of breath: Secondary | ICD-10-CM

## 2020-09-09 LAB — BASIC METABOLIC PANEL
BUN/Creatinine Ratio: 19 (ref 12–28)
BUN: 40 mg/dL — ABNORMAL HIGH (ref 8–27)
CO2: 26 mmol/L (ref 20–29)
Calcium: 9.3 mg/dL (ref 8.7–10.3)
Chloride: 101 mmol/L (ref 96–106)
Creatinine, Ser: 2.06 mg/dL — ABNORMAL HIGH (ref 0.57–1.00)
GFR calc Af Amer: 25 mL/min/{1.73_m2} — ABNORMAL LOW (ref >59)
GFR calc non Af Amer: 22 mL/min/{1.73_m2} — ABNORMAL LOW (ref >59)
Glucose: 123 mg/dL — ABNORMAL HIGH (ref 65–99)
Potassium: 3.2 mmol/L — ABNORMAL LOW (ref 3.5–5.2)
Sodium: 141 mmol/L (ref 134–144)

## 2020-09-09 LAB — CBC
Hematocrit: 29.3 % — ABNORMAL LOW (ref 34.0–46.6)
Hemoglobin: 9.2 g/dL — ABNORMAL LOW (ref 11.1–15.9)
MCH: 26.7 pg (ref 26.6–33.0)
MCHC: 31.4 g/dL — ABNORMAL LOW (ref 31.5–35.7)
MCV: 85 fL (ref 79–97)
Platelets: 218 10*3/uL (ref 150–450)
RBC: 3.45 x10E6/uL — ABNORMAL LOW (ref 3.77–5.28)
RDW: 12.8 % (ref 11.7–15.4)
WBC: 5.8 10*3/uL (ref 3.4–10.8)

## 2020-09-09 NOTE — Patient Instructions (Addendum)
After Visit Summary:  We will be checking the following labs today - BMET and CBC  Please go to [facility] to [place] on the first floor for a chest Xray - you may walk in.    Medication Instructions:    Continue with your current medicines. BUT  I am increasing the Lasix to 2 pills a day for one week - then back to 1 1/2 tablets daily    If you need a refill on your cardiac medications before your next appointment, please call your pharmacy.     Testing/Procedures To Be Arranged:  CXR  Follow-Up:   See Dr. Harrington Challenger as planned    At [facility], you and your health needs are our priority.  As part of our continuing mission to provide you with exceptional heart care, we have created designated Provider Care Teams.  These Care Teams include your primary Cardiologist (physician) and Advanced Practice Providers (APPs -  Physician Assistants and Nurse Practitioners) who all work together to provide you with the care you need, when you need it.  Special Instructions:  . Stay safe, wash your hands for at least 20 seconds and wear a mask when needed.  . It was good to talk with you today.    Call the [place] office at [phone] if you have any questions, problems or concerns.

## 2020-09-09 NOTE — Progress Notes (Signed)
CARDIOLOGY OFFICE NOTE  Date:  09/09/2020    Jillian [name] Date of Birth: 24-Jun-1936 Medical Record #[id number]  PCP:  Antony Blackbird, MD  Cardiologist:  Harrington Challenger   Chief Complaint  Patient presents with  . Shortness of Breath    Work in visit - seen for Dr. Harrington Challenger    History of Present Illness: Jillian [name] is a 84 y.o. female who presents today for a work in visit. Seen for Dr. Harrington Challenger.   She has a history of HTN, CKD, prior CVA, and HLD. Seen originally back in August as hospital consult with dyspnea and chest pain - demand ischemia - EF normal - diuresed - declined ILR with prior stroke.   Seen by me for her post hospital visit back in September - BP high - felt bad - noted to be in AF - I added Eliquis - Plavix and aspirin stopped.   Seen by Dr. Harrington Challenger earlier this month and felt to be doing ok. Still with some swelling noted.   Phone call yesterday - "Spoke with pt's daughter (DPR). 4 days ago started to not be able to lay flat in bed.  SOB, pressure in back and chest.  Being propped up in chair is more comfortable. No weight changes noted.  Taking lasix 60 mg daily.  No cough.  No SOB other than as noted above.  HRs 55-60 using BP cuff and pulse ox.  BPs 125/64-176/64.  No pulse ox available for this call.  Scheduled follow up in office tomorrow with APP.  Pt's daughter appreciative for the call/appointment."  Thus added to my schedule for today.   Comes in today. Here with daughter and grandson today. She is in a wheelchair. She says she "still has fluid in her lungs". She "feels it". Difficult for her to lie down. Worse with turning over and lying back on her back. Feels a pulling sensation and stinging sensation as well. Feels "like water is under her heart". Has to sit in chair to sleep. Says she has had this off and on since her admission back in the summer. She thought this was gas. She is restricting her salt. Taking her medicines - currently on 60 mg of Lasix a day. Her  weight is stable. Only drinking 2 regular bottles of water a day. She does cough - moreso at night - some foamy like sputum at times.   Past Medical History:  Diagnosis Date  . Hypertension   . Renal disorder    CKD Stage IV  . Stroke [facility])     Past Surgical History:  Procedure Laterality Date  . CARDIAC CATHETERIZATION  2009     Medications: Current Meds  Medication Sig  . amLODipine (NORVASC) 5 MG tablet Take 1 tablet (5 mg total) by mouth daily.  Marland Kitchen apixaban (ELIQUIS) 2.5 MG TABS tablet Take 1 tablet (2.5 mg total) by mouth 2 (two) times daily.  . carvedilol (COREG) 12.5 MG tablet Take 1 tablet (12.5 mg total) by mouth 2 (two) times daily.  . Ferrous Sulfate (IRON) 325 (65 Fe) MG TABS Take 1 tablet by mouth daily.  . furosemide (LASIX) 40 MG tablet Take 1.5 tablets (60 mg total) by mouth daily.  . rosuvastatin (CRESTOR) 20 MG tablet Take 1 tablet (20 mg total) by mouth daily. To lower cholesterol  . telmisartan (MICARDIS) 20 MG tablet Take 20 mg by mouth daily.     Allergies: Allergies  Allergen Reactions  . Penicillins Nausea Only  .  Aspirin Other (See Comments)  . Atorvastatin Other (See Comments)    fatigue  . Diltiazem Hcl Nausea And Vomiting    Social History: The patient  reports that she has never smoked. She has never used smokeless tobacco.   Family History: The patient's family history includes Breast cancer in her mother; CAD in her mother.   Review of Systems: Please see the history of present illness.   All other systems are reviewed and negative.   Physical Exam: VS:  BP 130/62   Pulse (!) 56   Ht 5\' 1"  (1.549 m)   Wt 183 lb 3.2 oz (83.1 kg)   SpO2 99%   BMI 34.62 kg/m  .  BMI Body mass index is 34.62 kg/m.  Wt Readings from Last 3 Encounters:  09/09/20 183 lb 3.2 oz (83.1 kg)  08/15/20 185 lb 3.2 oz (84 kg)  08/04/20 185 lb 3.2 oz (84 kg)    General: Elderly. Alert and in no acute distress.   Cardiac: Regular rate and rhythm. No  murmurs, rubs, or gallops. No edema.  Respiratory:  Lungs with some rales in the bases bilaterally but with normal work of breathing.  GI: Soft and nontender.  MS: No deformity or atrophy. Gait and ROM intact.  Skin: Warm and dry. Color is normal.  Neuro:  Strength and sensation are intact and no gross focal deficits noted.  Psych: Alert, appropriate and with normal affect.   LABORATORY DATA:  EKG:  EKG is ordered today.  Personally reviewed by me. This demonstrates sinus bradycardia - HR is 56 - with LVH.  Lab Results  Component Value Date   WBC 8.3 08/04/2020   HGB 10.8 (L) 08/04/2020   HCT 33.9 (L) 08/04/2020   PLT 217 08/04/2020   GLUCOSE 97 08/28/2020   CHOL 116 07/13/2020   TRIG 80 07/13/2020   HDL 32 (L) 07/13/2020   LDLCALC 68 07/13/2020   ALT 27 07/09/2020   AST 27 07/09/2020   NA 138 08/28/2020   K 3.7 08/28/2020   CL 103 08/28/2020   CREATININE 1.87 (H) 08/28/2020   BUN 24 08/28/2020   CO2 24 08/28/2020   TSH 3.940 07/23/2020   HGBA1C 6.3 (H) 07/13/2020     BNP (last 3 results) Recent Labs    07/09/20 0018  BNP 285.1*    ProBNP (last 3 results) Recent Labs    08/28/20 0914  PROBNP 930*     Other Studies Reviewed Today:  ECHO IMPRESSIONS 06/2020  1. Left ventricular ejection fraction, by estimation, is 55 to 60%. The  left ventricle has normal function. The left ventricle has no regional  wall motion abnormalities. There is mild concentric left ventricular  hypertrophy. Indeterminate diastolic  filling due to E-A fusion.  2. Right ventricular systolic function is normal. The right ventricular  size is normal. Tricuspid regurgitation signal is inadequate for assessing  PA pressure.  3. The mitral valve is degenerative. Mild mitral valve regurgitation.  4. The aortic valve is tricuspid. Aortic valve regurgitation is not  visualized. Mild aortic valve sclerosis is present, with no evidence of  aortic valve stenosis.  5. The inferior vena  cava is normal in size with greater than 50%  respiratory variability, suggesting right atrial pressure of 3 mmHg.    MR ANGIO HEAD WO CONTRAST 07/13/2020 IMPRESSION:  Motion degraded. No proximal intracranial vessel occlusion or definite significant stenosis.   MR BRAIN WO CONTRAST 07/11/2020 IMPRESSION:  1. Numerous acute infarctions scattered within the  right hemisphere within the right middle cerebral artery territory. Mild swelling but no hemorrhage or mass effect.  2. Extensive old ischemic changes elsewhere throughout the brain as outlined above.   CT Head WO Contrast 07/09/20 IMPRESSION: 1. No acute intracranial hemorrhage. 2. Moderate age-related atrophy and chronic microvascular ischemic changes. Areas of old infarct.  VAS US CAROTID 07/13/2020 Summary:  Right Carotid: The extracranial vessels were near-normal with only minimal wall thickening or plaque.  Left Carotid: The extracranial vessels were near-normal with only minimal wall thickening or plaque.  Vertebrals: Bilateral vertebral arteries demonstrate antegrade flow.  Subclavians: Normal flow hemodynamics were seen in bilateral subclavian arteries.  Final     Assessment and Plan:   1. Chronic diastolic HF - complaining of more symptoms - will recheck her lab - sending for CXR - increase Lasix to 80 mg a day for one week - then back to 60 mg. She is restricting her salt.   2. Prior NSTEMI - demand ischemia - medically managed.   3. HTN - with LVH - BP is ok today.   4. CKD - seeing Renal next week. [place] lab today. She is back on ARB therapy.   5. High risk medicine - lab today.   6. PAF - on anticoagulation - she is in sinus today - no bleeding noted.   7. Multiple infarcts - now on anticoagulation.  Current medicines are reviewed with the patient today.  The patient does not have concerns regarding medicines other than what has been noted above.  The following changes have been made:  See  above.  Labs/ tests ordered today include:    Orders Placed This Encounter  Procedures  . DG Chest 2 View  . Basic metabolic panel  . CBC  . EKG 12-Lead     Disposition:   FU with Dr. Harrington Challenger as planned. Lasix is increased today. Sending for CXR and labs today.   Patient is agreeable to this plan and will call if any problems develop in the interim.   SignedTruitt Merle, NP  09/09/2020 11:44 AM  [facility] [address] [place], [place]  [address] Phone: [phone] Fax: [phone]

## 2020-09-10 ENCOUNTER — Other Ambulatory Visit: Payer: Self-pay

## 2020-09-10 ENCOUNTER — Telehealth: Payer: Self-pay

## 2020-09-10 MED ORDER — POTASSIUM CHLORIDE CRYS ER 20 MEQ PO TBCR: 20.0000 meq | EXTENDED_RELEASE_TABLET | Freq: Every day | ORAL | 3 refills | Status: DC

## 2020-09-10 NOTE — Telephone Encounter (Signed)
Patient followed with cardiology and I reviewed her visit note.

## 2020-09-10 NOTE — Telephone Encounter (Signed)
-----   Message from Burtis Junes, NP sent at 09/09/2020  6:04 PM EDT ----- Ok to report. Potassium is a low - needs to start Kdur 20 meq - take 2 pills x 1 dose and then one pill daily. Copy to Renal - she has upcoming visit with them. Her kidney function looks like she is actually a little too "dry" Only take the 80 mg of Lasix for 3 days (not a whole week) and then back to her regular dose.

## 2020-09-10 NOTE — Telephone Encounter (Signed)
Spoke with the patients daughter and advised her of LG, NP recommendations from recent lab work. She verbalized understanding. Sent Kdur 20 mEq into pharmacy.

## 2020-09-15 ENCOUNTER — Ambulatory Visit
Admission: RE | Admit: 2020-09-15 | Discharge: 2020-09-15 | Disposition: A | Discharge status: Home / Self Care | Payer: Medicare Other | Source: Ambulatory Visit | Attending: Internal Medicine | Admitting: Internal Medicine

## 2020-09-15 DIAGNOSIS — N184 Chronic kidney disease, stage 4 (severe): Secondary | ICD-10-CM

## 2020-09-15 DIAGNOSIS — I129 Hypertensive chronic kidney disease with stage 1 through stage 4 chronic kidney disease, or unspecified chronic kidney disease: Secondary | ICD-10-CM

## 2020-10-07 ENCOUNTER — Telehealth: Payer: Self-pay | Admitting: Internal Medicine

## 2020-10-07 MED ORDER — APIXABAN 2.5 MG PO TABS: 2.5000 mg | ORAL_TABLET | Freq: Two times a day (BID) | ORAL | 3 refills | Status: DC

## 2020-10-07 NOTE — Telephone Encounter (Signed)
The patient has been notified that insurance is no longer covering her Eliquis. Her granddaughter will come by the office to pick up $10 copay card. I will send a 3 month supply of Eliquis to [facility] .   She will pick up and take dose this afternoon. Forwarding to PharmD pool per protocol. The patient is in the process of switching to [employer].

## 2020-10-07 NOTE — Telephone Encounter (Signed)
Patient's granddaughter called in to let us know that due to insurance changes her apixaban (ELIQUIS) 2.5 MG TABS tablet has went up in price and she cannot afford it. They would like to know if there are any samples/coupons or what options they have to get this medication. Patient has been out of medication since 10/05/20, they tried to work with insurance first. Insurance told the patient to call us in regards to what options there are for them. Please call/advise  Thank you!

## 2020-10-07 NOTE — Telephone Encounter (Signed)
Provided patient's granddaughter with 2 weeks of samples of Eliquis 2.5 mg, along w copay cards and the number to call for patient assistance.  She's going to the pharmacy now. If the copay cards won't work, she will call the drug company.  She said both the insurance company and the pharmacy told the patient the Eliquis is no longer being covered.

## 2020-10-07 NOTE — Telephone Encounter (Signed)
$  10 copay card will NOT work with MEDICARE or MEDICAID.   30 day free card will work if never uses before or provide samples (if available), until additional paperwork completed.  Formulary changes for new insurance will take effect January and should not affect current refills.  Please gte copy of current insurance card , process PA if needed and complete patient assistance paperwork if applicable.

## 2020-10-17 ENCOUNTER — Encounter: Payer: Self-pay | Admitting: Internal Medicine

## 2020-10-17 ENCOUNTER — Other Ambulatory Visit: Payer: Self-pay

## 2020-10-17 ENCOUNTER — Ambulatory Visit (INDEPENDENT_AMBULATORY_CARE_PROVIDER_SITE_OTHER): Payer: Medicare Other | Admitting: Internal Medicine

## 2020-10-17 VITALS — BP 152/60 | HR 81 | Ht 61.0 in | Wt 187.4 lb

## 2020-10-17 DIAGNOSIS — I5032 Chronic diastolic (congestive) heart failure: Secondary | ICD-10-CM

## 2020-10-17 DIAGNOSIS — I639 Cerebral infarction, unspecified: Secondary | ICD-10-CM

## 2020-10-17 NOTE — Patient Instructions (Signed)
Medication Instructions:  No changes *If you need a refill on your cardiac medications before your next appointment, please call your pharmacy*  Lab Work: Today: bmet, bnp   Testing/Procedures: none   Follow-Up: With Truitt Merle or Dorris Carnes, MD around the end of January Other Instructions

## 2020-10-17 NOTE — Progress Notes (Signed)
Cardiology Office Note   Date:  10/17/2020   ID:  Jillian [name], DOB 21-Aug-1936, MRN [id number]  PCP:  Antony Blackbird, MD  Cardiologist:   Dorris Carnes, MD   Pt presents for f/u of CHF    History of Present Illness: Jillian [name] is a 84 y.o. female with hx of HTN, CKD, CVA, HL  I saw her in the hospital in consult in Aug 2021  She presented with SOB/ chest tightness.  Troponin elevated, felt due to demand    Echo showed LVEF 55 to 60%  She diuresed    The pt was seen by EP on that admit given hx of CVA   She declined implant of loop recorder   The pt was seen by L Gerhardt on 9.20  BP noted to be high at times   Complained of swelling   Complained of feeling poorly  At that clinic visit she was found to be in afib   Eliquis was added and Plavix/ASA stopped  Coreg increased   SInce seen the pt's breathing is OK   Still complains of some LE swelling   She denies CP  She denies palpitaitons          Current Meds  Medication Sig  . amLODipine (NORVASC) 5 MG tablet Take 1 tablet (5 mg total) by mouth daily.  Marland Kitchen apixaban (ELIQUIS) 2.5 MG TABS tablet Take 1 tablet (2.5 mg total) by mouth 2 (two) times daily.  . carvedilol (COREG) 12.5 MG tablet Take 1 tablet (12.5 mg total) by mouth 2 (two) times daily.  . Ferrous Sulfate (IRON) 325 (65 Fe) MG TABS Take 1 tablet by mouth daily.  . furosemide (LASIX) 40 MG tablet Take 1.5 tablets (60 mg total) by mouth daily.  . potassium chloride SA (KLOR-CON) 20 MEQ tablet Take 1 tablet (20 mEq total) by mouth daily.  . rosuvastatin (CRESTOR) 20 MG tablet Take 1 tablet (20 mg total) by mouth daily. To lower cholesterol  . telmisartan (MICARDIS) 20 MG tablet Take 20 mg by mouth daily.     Allergies:   Penicillins, Aspirin, Atorvastatin, and Diltiazem hcl   Past Medical History:  Diagnosis Date  . Hypertension   . Renal disorder    CKD Stage IV  . Stroke [facility])     Past Surgical History:  Procedure Laterality Date  . CARDIAC CATHETERIZATION   2009     Social History:  The patient  reports that she has never smoked. She has never used smokeless tobacco.   Family History:  The patient's family history includes Breast cancer in her mother; CAD in her mother.    ROS:  Please see the history of present illness. All other systems are reviewed and  Negative to the above problem except as noted.    PHYSICAL EXAM: VS:  BP (!) 152/60   Pulse 81   Ht 5\' 1"  (1.549 m)   Wt 187 lb 6.4 oz (85 kg)   SpO2 97%   BMI 35.41 kg/m   GEN: Obese 84 yo in NAD   HEENT: normal  Neck: JVP is not elevated , carotid bruits Cardiac: RRR; no murmurs  Tr LE edema   Respiratory:  clear to auscultation bilaterally,  GI: soft, nontender, nondistended, + BS  MS: no deformity Moving all extremities   Skin: warm and dry, no rash Neuro:  Deferred   EKG:  EKG isnot  ordered today.  Lipid Panel    Component Value Date/Time   CHOL  116 07/13/2020 0459   TRIG 80 07/13/2020 0459   HDL 32 (L) 07/13/2020 0459   CHOLHDL 3.6 07/13/2020 0459   VLDL 16 07/13/2020 0459   LDLCALC 68 07/13/2020 0459      Wt Readings from Last 3 Encounters:  10/17/20 187 lb 6.4 oz (85 kg)  09/09/20 183 lb 3.2 oz (83.1 kg)  08/15/20 185 lb 3.2 oz (84 kg)      ASSESSMENT AND PLAN:  1  Chronic diastolic CHF   Volume status is not too bad   I would keep on current regimen of lasix    Check BMET and BNP today     2  PAF   Clinically in SR   Keep on Coreg and Eliquis    3  HTN  BP is mildly increased today   Follow     4  CKD  BMET today     5  HL  Keep on Crestor  Last LDL 68     Plan for f/u in Jan   Watch Na intake over holidays       Current medicines are reviewed at length with the patient today.  The patient does not have concerns regarding medicines.  Signed, Dorris Carnes, MD  10/17/2020 11:09 AM    [facility] [place], [place], [place]  [address] Phone: [phone]; Fax: [phone]

## 2020-10-18 LAB — BASIC METABOLIC PANEL
BUN/Creatinine Ratio: 15 (ref 12–28)
BUN: 28 mg/dL — ABNORMAL HIGH (ref 8–27)
CO2: 26 mmol/L (ref 20–29)
Calcium: 8.9 mg/dL (ref 8.7–10.3)
Chloride: 104 mmol/L (ref 96–106)
Creatinine, Ser: 1.9 mg/dL — ABNORMAL HIGH (ref 0.57–1.00)
GFR calc Af Amer: 28 mL/min/{1.73_m2} — ABNORMAL LOW (ref >59)
GFR calc non Af Amer: 24 mL/min/{1.73_m2} — ABNORMAL LOW (ref >59)
Glucose: 98 mg/dL (ref 65–99)
Potassium: 3.8 mmol/L (ref 3.5–5.2)
Sodium: 142 mmol/L (ref 134–144)

## 2020-10-18 LAB — PRO B NATRIURETIC PEPTIDE: NT-Pro BNP: 884 pg/mL — ABNORMAL HIGH (ref 0–738)

## 2020-11-03 ENCOUNTER — Telehealth: Payer: Self-pay | Admitting: *Deleted

## 2020-11-03 NOTE — Telephone Encounter (Signed)
Patients daughter [name] called and stated that she had just spoke with a representative from eliqu[employer] patient assistance and was informed that further information was needed to process her application. She has faxed them the documents as requested but she is requesting samples in the interim. I am placing two boxes of eliquis 2.5 mg at the front desk for her to pick up. Lot #[id number] EXP Oct 2022

## 2020-11-27 NOTE — Progress Notes (Signed)
CARDIOLOGY OFFICE NOTE  Date:  12/11/2020    Jillian [name] Date of Birth: 1936-08-11 Medical Record #[id number]  PCP:  Antony Blackbird, MD (Inactive)  Cardiologist:  Jerilynn Mages    Chief Complaint  Patient presents with  . Follow-up    Seen for Dr. Harrington Challenger    History of Present Illness: Jillian [name] is a 85 y.o. female who presents today for a follow up visit. Seen for Dr. Harrington Challenger.   She has a history of HTN, CKD, CVA, and HLD. Originally seen back in the hospital last August with chest pain and SOB. Felt to have demand ischemia. Was diuresed. Declined loop implant. I saw her in October - BP high - she was in AF - Eliquis was added and ASA/Plavix stopped.   Last seen by Dr. Harrington Challenger early last month - felt to be doing ok - some swelling still.   Comes in today. Here with her daughter - who helps augment the history. She notes she has gained weight from the recent holidays. But her weight is actually down.  Says she is doing good. Her breathing is stable. No chest pain. More concerned about her skin and her hair. Not dizzy. No falls. Needs samples of Eliquis and needs meds refilled.   Past Medical History:  Diagnosis Date  . Hypertension   . Renal disorder    CKD Stage IV  . Stroke [facility])     Past Surgical History:  Procedure Laterality Date  . CARDIAC CATHETERIZATION  2009     Medications: Current Meds  Medication Sig  . amLODipine (NORVASC) 5 MG tablet Take 1 tablet (5 mg total) by mouth daily.  . Ferrous Sulfate (IRON) 325 (65 Fe) MG TABS Take 1 tablet by mouth daily.  . [DISCONTINUED] apixaban (ELIQUIS) 2.5 MG TABS tablet Take 1 tablet (2.5 mg total) by mouth 2 (two) times daily.  . [DISCONTINUED] carvedilol (COREG) 12.5 MG tablet Take 1 tablet (12.5 mg total) by mouth 2 (two) times daily.  . [DISCONTINUED] furosemide (LASIX) 40 MG tablet Take 1.5 tablets (60 mg total) by mouth daily.  . [DISCONTINUED] potassium chloride SA (KLOR-CON) 20 MEQ tablet Take 1 tablet (20  mEq total) by mouth daily.  . [DISCONTINUED] rosuvastatin (CRESTOR) 20 MG tablet Take 1 tablet (20 mg total) by mouth daily. To lower cholesterol  . [DISCONTINUED] telmisartan (MICARDIS) 20 MG tablet Take 20 mg by mouth daily.     Allergies: Allergies  Allergen Reactions  . Penicillins Nausea Only  . Aspirin Other (See Comments)  . Atorvastatin Other (See Comments)    fatigue  . Diltiazem Hcl Nausea And Vomiting    Social History: The patient  reports that she has never smoked. She has never used smokeless tobacco.   Family History: The patient's family history includes Breast cancer in her mother; CAD in her mother.   Review of Systems: Please see the history of present illness.   All other systems are reviewed and negative.   Physical Exam: VS:  BP (!) 142/56   Pulse (!) 57   Ht 5\' 1"  (1.549 m)   Wt 182 lb (82.6 kg)   SpO2 97%   BMI 34.39 kg/m  .  BMI Body mass index is 34.39 kg/m.  Wt Readings from Last 3 Encounters:  12/11/20 182 lb (82.6 kg)  10/17/20 187 lb 6.4 oz (85 kg)  09/09/20 183 lb 3.2 oz (83.1 kg)    General: Alert and in no acute distress. Her weight  is actually down from last visit.  She is in a wheelchair.  Cardiac: Fairly regular today. Rate is fine. No murmurs, rubs, or gallops. No edema.  Respiratory:  Lungs are clear to auscultation bilaterally with normal work of breathing.  GI: Soft and nontender.  MS: No deformity or atrophy. Gait not tested.  Skin: Warm and dry. Color is normal.  Neuro:  Strength and sensation are intact and no gross focal deficits noted.  Psych: Alert, appropriate and with normal affect.   LABORATORY DATA:  EKG:  EKG is not ordered today.    Lab Results  Component Value Date   WBC 5.8 09/09/2020   HGB 9.2 (L) 09/09/2020   HCT 29.3 (L) 09/09/2020   PLT 218 09/09/2020   GLUCOSE 98 10/17/2020   CHOL 116 07/13/2020   TRIG 80 07/13/2020   HDL 32 (L) 07/13/2020   LDLCALC 68 07/13/2020   ALT 27 07/09/2020   AST 27  07/09/2020   NA 142 10/17/2020   K 3.8 10/17/2020   CL 104 10/17/2020   CREATININE 1.90 (H) 10/17/2020   BUN 28 (H) 10/17/2020   CO2 26 10/17/2020   TSH 3.940 07/23/2020   HGBA1C 6.3 (H) 07/13/2020     BNP (last 3 results) Recent Labs    07/09/20 0018  BNP 285.1*    ProBNP (last 3 results) Recent Labs    08/28/20 0914 10/17/20 1138  PROBNP 930* 884*     Other Studies Reviewed Today:  ECHO IMPRESSIONS 06/2020   1. Left ventricular ejection fraction, by estimation, is 55 to 60%. The  left ventricle has normal function. The left ventricle has no regional  wall motion abnormalities. There is mild concentric left ventricular  hypertrophy. Indeterminate diastolic  filling due to E-A fusion.   2. Right ventricular systolic function is normal. The right ventricular  size is normal. Tricuspid regurgitation signal is inadequate for assessing  PA pressure.   3. The mitral valve is degenerative. Mild mitral valve regurgitation.   4. The aortic valve is tricuspid. Aortic valve regurgitation is not  visualized. Mild aortic valve sclerosis is present, with no evidence of  aortic valve stenosis.   5. The inferior vena cava is normal in size with greater than 50%  respiratory variability, suggesting right atrial pressure of 3 mmHg.      MR ANGIO HEAD WO CONTRAST 07/13/2020 IMPRESSION:  Motion degraded. No proximal intracranial vessel occlusion or definite significant stenosis.    MR BRAIN WO CONTRAST 07/11/2020 IMPRESSION:  1. Numerous acute infarctions scattered within the right hemisphere within the right middle cerebral artery territory. Mild swelling but no hemorrhage or mass effect.  2. Extensive old ischemic changes elsewhere throughout the brain as outlined above.    CT Head WO Contrast 07/09/20 IMPRESSION: 1. No acute intracranial hemorrhage. 2. Moderate age-related atrophy and chronic microvascular ischemic changes. Areas of old infarct.   VAS US  CAROTID 07/13/2020 Summary:  Right Carotid: The extracranial vessels were near-normal with only minimal wall  thickening or plaque.  Left Carotid: The extracranial vessels were near-normal with only minimal wall thickening or plaque.  Vertebrals:  Bilateral vertebral arteries demonstrate antegrade flow.  Subclavians: Normal flow hemodynamics were seen in bilateral subclavian arteries.  Final          Assessment and Plan:    1. Chronic diastolic HF - doing well - weight is down - recheck her lab today.   2. Prior NSTEMI - demand ischemia - manage conservatively - no worrisome  symptoms noted.   3. HTN - with LVH - just had her medicines.   4. CKD - lab today - she is on ARB therapy.   5. PAF - seems to be in sinus - her rate is fine - she is on anticoagulation  6. Complains of skin and hair issue - would defer to PCP/dermatology  7. Multiple infarcts on CT head - now on anticoagulation  8. HLD - on statin.    Current medicines are reviewed with the patient today.  The patient does not have concerns regarding medicines other than what has been noted above.  The following changes have been made:  See above.  Labs/ tests ordered today include:    Orders Placed This Encounter  Procedures  . Basic metabolic panel  . CBC  . TSH     Disposition:   FU with Dr. Harrington Challenger in about 3 to 4 months. Lab today.    Patient is agreeable to this plan and will call if any problems develop in the interim.   SignedTruitt Merle, NP  12/11/2020 11:03 AM  [facility] [address] [place], [place]  [address] Phone: [phone] Fax: [phone]

## 2020-12-11 ENCOUNTER — Other Ambulatory Visit: Payer: Self-pay

## 2020-12-11 ENCOUNTER — Ambulatory Visit: Payer: Medicare Other | Admitting: Nurse Practitioner

## 2020-12-11 ENCOUNTER — Encounter: Payer: Self-pay | Admitting: Nurse Practitioner

## 2020-12-11 VITALS — BP 142/56 | HR 57 | Ht 61.0 in | Wt 182.0 lb

## 2020-12-11 DIAGNOSIS — Z79899 Other long term (current) drug therapy: Secondary | ICD-10-CM

## 2020-12-11 DIAGNOSIS — R0602 Shortness of breath: Secondary | ICD-10-CM | POA: Diagnosis not present

## 2020-12-11 DIAGNOSIS — E782 Mixed hyperlipidemia: Secondary | ICD-10-CM

## 2020-12-11 DIAGNOSIS — I480 Paroxysmal atrial fibrillation: Secondary | ICD-10-CM

## 2020-12-11 DIAGNOSIS — Z7901 Long term (current) use of anticoagulants: Secondary | ICD-10-CM

## 2020-12-11 DIAGNOSIS — I5032 Chronic diastolic (congestive) heart failure: Secondary | ICD-10-CM | POA: Diagnosis not present

## 2020-12-11 DIAGNOSIS — L659 Nonscarring hair loss, unspecified: Secondary | ICD-10-CM

## 2020-12-11 LAB — CBC
Hematocrit: 30.7 % — ABNORMAL LOW (ref 34.0–46.6)
Hemoglobin: 9.6 g/dL — ABNORMAL LOW (ref 11.1–15.9)
MCH: 26.8 pg (ref 26.6–33.0)
MCHC: 31.3 g/dL — ABNORMAL LOW (ref 31.5–35.7)
MCV: 86 fL (ref 79–97)
Platelets: 246 10*3/uL (ref 150–450)
RBC: 3.58 x10E6/uL — ABNORMAL LOW (ref 3.77–5.28)
RDW: 12.9 % (ref 11.7–15.4)
WBC: 7.2 10*3/uL (ref 3.4–10.8)

## 2020-12-11 LAB — BASIC METABOLIC PANEL
BUN/Creatinine Ratio: 27 (ref 12–28)
BUN: 52 mg/dL — ABNORMAL HIGH (ref 8–27)
CO2: 24 mmol/L (ref 20–29)
Calcium: 9.4 mg/dL (ref 8.7–10.3)
Chloride: 103 mmol/L (ref 96–106)
Creatinine, Ser: 1.95 mg/dL — ABNORMAL HIGH (ref 0.57–1.00)
GFR calc Af Amer: 27 mL/min/{1.73_m2} — ABNORMAL LOW (ref >59)
GFR calc non Af Amer: 23 mL/min/{1.73_m2} — ABNORMAL LOW (ref >59)
Glucose: 108 mg/dL — ABNORMAL HIGH (ref 65–99)
Potassium: 3.5 mmol/L (ref 3.5–5.2)
Sodium: 141 mmol/L (ref 134–144)

## 2020-12-11 LAB — TSH: TSH: 3.52 u[IU]/mL (ref 0.450–4.500)

## 2020-12-11 MED ORDER — FUROSEMIDE 40 MG PO TABS: 60.0000 mg | ORAL_TABLET | Freq: Every day | ORAL | 6 refills | Status: DC

## 2020-12-11 MED ORDER — APIXABAN 2.5 MG PO TABS: 2.5000 mg | ORAL_TABLET | Freq: Two times a day (BID) | ORAL | 6 refills | Status: DC

## 2020-12-11 MED ORDER — TELMISARTAN 20 MG PO TABS: 20.0000 mg | ORAL_TABLET | Freq: Every day | ORAL | 6 refills | Status: DC

## 2020-12-11 MED ORDER — ROSUVASTATIN CALCIUM 20 MG PO TABS: 20.0000 mg | ORAL_TABLET | Freq: Every day | ORAL | 6 refills | Status: DC

## 2020-12-11 MED ORDER — POTASSIUM CHLORIDE CRYS ER 20 MEQ PO TBCR: 20.0000 meq | EXTENDED_RELEASE_TABLET | Freq: Every day | ORAL | 6 refills | Status: DC

## 2020-12-11 MED ORDER — CARVEDILOL 12.5 MG PO TABS: 12.5000 mg | ORAL_TABLET | Freq: Two times a day (BID) | ORAL | 6 refills | Status: DC

## 2020-12-11 NOTE — Patient Instructions (Addendum)
  After Visit Summary:  We will be checking the following labs today - BMET & CBC   Medication Instructions:    Continue with your current medicines.   Samples of Eliquis if possible.   I have refilled your medicines  The iron is over the counter.    If you need a refill on your cardiac medications before your next appointment, please call your pharmacy.     Testing/Procedures To Be Arranged:  N/A  Follow-Up:   See Dr. Harrington Challenger in 3 to 4 months.      At [facility], you and your health needs are our priority.  As part of our continuing mission to provide you with exceptional heart care, we have created designated Provider Care Teams.  These Care Teams include your primary Cardiologist (physician) and Advanced Practice Providers (APPs -  Physician Assistants and Nurse Practitioners) who all work together to provide you with the care you need, when you need it.  Special Instructions:  . Stay safe, wash your hands for at least 20 seconds and wear a mask when needed.  . It was good to talk with you today.  . Continue to try and limit your salt/sodium intake.    Call the [place] office at [phone] if you have any questions, problems or concerns.

## 2020-12-12 ENCOUNTER — Telehealth: Payer: Self-pay

## 2020-12-12 DIAGNOSIS — Z79899 Other long term (current) drug therapy: Secondary | ICD-10-CM

## 2020-12-12 NOTE — Telephone Encounter (Signed)
The patient/her daughter has been notified of the result and verbalized understanding.  All questions (if any) were answered.  May 6th 2022 lab appt made CBC & BMET orders placed  Wilma Flavin, RN 12/12/2020 8:16 AM

## 2020-12-12 NOTE — Telephone Encounter (Signed)
-----   Message from Burtis Junes, NP sent at 12/11/2020  6:37 PM EST ----- Ok to report. Would speak with her daughter. Labs are stable - kidneys look a little more "dry" - ok to cut Lasix back to 40 mg a day.  Remains anemic Needs repeat BMET and CBC when she sees Dr. Harrington Challenger again - otherwise, would continue on current regimen.

## 2020-12-22 ENCOUNTER — Ambulatory Visit: Payer: Medicare Other | Admitting: Adult Health

## 2020-12-22 ENCOUNTER — Other Ambulatory Visit: Payer: Self-pay

## 2020-12-22 ENCOUNTER — Encounter: Payer: Self-pay | Admitting: Adult Health

## 2020-12-22 VITALS — BP 141/60 | HR 54 | Ht 61.0 in | Wt 182.6 lb

## 2020-12-22 DIAGNOSIS — I63411 Cerebral infarction due to embolism of right middle cerebral artery: Secondary | ICD-10-CM

## 2020-12-22 NOTE — Patient Instructions (Signed)
Continue Eliquis (apixaban) daily  and Crestor for secondary stroke prevention  Continue to routinely follow with cardiology for Eliquis and atrial fibrillation monitoring/management  Continue to follow up with PCP and cardiology regarding cholesterol and blood pressure management  Maintain strict control of hypertension with blood pressure goal below 130/90 and cholesterol with LDL cholesterol (bad cholesterol) goal below 70 mg/dL.      Overall stable from stroke standpoint and recommend follow-up on an as-needed basis       Thank you for coming to see [place] at [facility]. I hope we have been able to provide you high quality care today.  You may receive a patient satisfaction survey over the next few weeks. We would appreciate your feedback and comments so that we may continue to improve ourselves and the health of our patients.

## 2020-12-22 NOTE — Progress Notes (Signed)
[facility] [address] [place]. [place] [address] [phone]       STROKE FOLLOW UP NOTE  [name] Date of Birth:  [birth year] Medical Record Number:  [id number]   Reason for Referral: stroke follow up    SUBJECTIVE:   CHIEF COMPLAINT:  Chief Complaint  Patient presents with  . Follow-up    Room 1 with her duaghter, Bertha Stakes. Hx of CVA. No new concerns today. She has continued Eliquis and Crestor for stroke prevention.    HPI:   Today, 12/22/2020, Ms. [name] returns for 68-month stroke follow-up accompanied by her daughter.  Stable from stroke standpoint without new or recurring stroke/TIA symptoms and denies residual deficits.  She denies any recent falls.  She will use wheelchair for long distance transport (due to [place]) but no AD needed around her home.  reports ongoing compliance with Eliquis and Crestor without side effects.  Blood pressure today 141/60.  Occasionally monitors at home which has been stable (recently took AM BP meds).  No further concerns at this time.   History provided for reference purposes only Initial visit 08/20/2020 JM: Ms. [name] is being seen for hospital follow-up accompanied by her daughter and grandson.  Per daughter, she initially recovered well without residual deficits but had a fall on Saturday after stepping on her grandsons toy and had worsening slurred speech and left facial weakness until the following morning returning back to baseline.  Denies head injury or any other physical injury.  Denies loss of balance or extremity weakness contributing to fall.  No residual deficits at this time.  Denies new stroke/TIA symptoms. At follow-up visit with cardiology, she was found to be in atrial fibrillation and initiated Eliquis with discontinuing aspirin but has continued on Plavix.  Currently remains on Eliquis and Plavix without bleeding or bruising.  Remains on Crestor without myalgias.  Blood pressure today 124/59.  Monitors at home and typically 140s.  No concerns at this time.  Stroke admission 07/09/2020 [name] is a 85 y.o. female with history of HTN, HLD, CHF, prior stroke (per patient-no residual deficits), CKD 4, and obesity who presented to the hospital for shortness of breath on 07/09/2020. The pt was brought in by EMS - had breathing difficulties en route - received a nebulizer treatment and CPAP then became hypertensive - SBP 210. Shortly thereafter developed speech difficulties.  Stroke work-up revealed right MCA territory infarcts, embolic secondary to unknown source.  TEE not recommended given advanced age and low yield.  Recommended placement of loop recorder to assess for possible atrial fibrillation but patient and daughter declined and advised to follow-up with cardiology outpatient to discuss further.  Initiated aspirin and Brilinta; Plavix PTA.  Hypertensive emergency upon arrival with SBP 210 stabilize during admission and resumed home meds of amlodipine and carvedilol.  LDL 68 and continued home dose Crestor 20 mg daily.  Other stroke risk factors include advanced age, prior history of stroke and history of CHF. Evaluated by therapy and recommended discharge to SNF for ongoing therapy needs but patient and family declined SNF placement and discharged home with recommendation of home health therapy  Stroke: Rt MCA territory infarcts - embolic - source unknown.  Code Stroke CT Head - not ordered  CT head -  No acute intracranial hemorrhage. Moderate age-related atrophy and chronic microvascular ischemic changes. Areas of old infarct.  MRI head - Numerous acute infarctions scattered within the right hemisphere within the right middle cerebral artery territory.  MRA  head - Motion degraded. No proximal intracranial vessel occlusion or definite significant stenosis.   Carotid Doppler - unremarkable  2D Echo -normal ejection fraction 55 to 60%.  No cardiac source embolism.    Will not  pursue TEE in this 85yo female given low yield and test risk  Recommend loop to look for AF as source of stroke. EP has already met with she and her daughter which decline loop at this time. They will follow up w ith Dr. Harrington Challenger in 2 weeks and can discuss further.   [employer] Virus 2 - negative  LDL - 68  HgbA1c - 6.3  UDS - not ordered  VTE prophylaxis - [place] Heparine  clopidogrel 75 mg daily prior to admission, now on aspirin 325 mg daily and clopidogrel 75 mg daily/ change to asprin and brilinta   Patient counseled to be compliant with her antithrombotic medications  Ongoing aggressive stroke risk factor management  Therapy recommendations:  SNF recommended --> patient and family declined  Disposition:  Home   ROS:   14 system review of systems performed and negative with exception of those listed in HPI  PMH:  Past Medical History:  Diagnosis Date  . Hypertension   . Renal disorder    CKD Stage IV  . Stroke [facility])     PSH:  Past Surgical History:  Procedure Laterality Date  . CARDIAC CATHETERIZATION  2009    Social History:  Social History   Socioeconomic History  . Marital status: Unknown    Spouse name: Not on file  . Number of children: Not on file  . Years of education: Not on file  . Highest education level: Not on file  Occupational History  . Not on file  Tobacco Use  . Smoking status: Never Smoker  . Smokeless tobacco: Never Used  Substance and Sexual Activity  . Alcohol use: Not on file  . Drug use: Not on file  . Sexual activity: Not on file  Other Topics Concern  . Not on file  Social History Narrative  . Not on file   Social Determinants of Health   Financial Resource Strain: Not on file  Food Insecurity: Not on file  Transportation Needs: Not on file  Physical Activity: Not on file  Stress: Not on file  Social Connections: Not on file  Intimate Partner Violence: Not on file    Family History:  Family History  Problem Relation  Age of Onset  . CAD Mother   . Breast cancer Mother     Medications:   Current Outpatient Medications on File Prior to Visit  Medication Sig Dispense Refill  . amLODipine (NORVASC) 5 MG tablet Take 1 tablet (5 mg total) by mouth daily. 90 tablet 1  . apixaban (ELIQUIS) 2.5 MG TABS tablet Take 1 tablet (2.5 mg total) by mouth 2 (two) times daily. 60 tablet 6  . carvedilol (COREG) 12.5 MG tablet Take 1 tablet (12.5 mg total) by mouth 2 (two) times daily. 60 tablet 6  . Ferrous Sulfate (IRON) 325 (65 Fe) MG TABS Take 1 tablet by mouth daily.    . furosemide (LASIX) 40 MG tablet Take 1.5 tablets (60 mg total) by mouth daily. 45 tablet 6  . potassium chloride SA (KLOR-CON) 20 MEQ tablet Take 1 tablet (20 mEq total) by mouth daily. 30 tablet 6  . rosuvastatin (CRESTOR) 20 MG tablet Take 1 tablet (20 mg total) by mouth daily. To lower cholesterol 30 tablet 6  . telmisartan (  MICARDIS) 20 MG tablet Take 1 tablet (20 mg total) by mouth daily. 30 tablet 6   No current facility-administered medications on file prior to visit.    Allergies:   Allergies  Allergen Reactions  . Penicillins Nausea Only  . Aspirin Other (See Comments)  . Atorvastatin Other (See Comments)    fatigue  . Diltiazem Hcl Nausea And Vomiting      OBJECTIVE:  Physical Exam  Vitals:   12/22/20 0803  BP: (!) 141/60  Pulse: (!) 54  Weight: 182 lb 9.6 oz (82.8 kg)  Height: _0  (1.549 m)   Body mass index is 34.5 kg/m. No exam data present  General: well developed, well nourished, very pleasant elderly African female, seated, in no evident distress Head: head normocephalic and atraumatic.   Neck: supple with no carotid or supraclavicular bruits Cardiovascular: regular rate and rhythm, no murmurs Musculoskeletal: no deformity Skin:  no rash/petichiae Vascular:  Normal pulses all extremities   Neurologic Exam Mental Status: Awake and fully alert.   Fluent speech and language.  Oriented to place and time.  Recent and remote memory intact. Attention span, concentration and fund of knowledge appropriate. Mood and affect appropriate.  Cranial Nerves: Pupils equal, briskly reactive to light. Extraocular movements full without nystagmus. Visual fields full to confrontation. Hearing intact. Facial sensation intact. Face, tongue, palate moves normally and symmetrically.  Motor: Normal bulk and tone. Normal strength in all tested extremity muscles. Sensory.: intact to touch , pinprick , position and vibratory sensation.  Coordination: Rapid alternating movements normal in all extremities. Finger-to-nose and heel-to-shin performed accurately bilaterally. Gait and Station: Deferred  Reflexes: 1+ and symmetric. Toes downgoing.       ASSESSMENT/PLAN: Jillian [name] is a 85 y.o. year old female presented to hospital initially with shortness of breath on 8/25 becoming hypertensive with SBP 210 and shortly thereafter developed speech difficulties.  Stroke work-up revealed right MCA territory infarcts, embolic secondary to unknown source.  Initially declined loop recorder placement but at cardiology follow-up outpatient, she was found to be in atrial fibrillation and Eliquis initiated.  Vascular risk factors include new dx of atrial fibrillation, HTN, HLD, prior stroke and history of CHF.     1. Right MCA stroke:  a. Recovered well without residual deficit b. Continue Eliquis (apixaban) daily  and Crestor for secondary stroke prevention.   c. Discussed secondary stroke prevention measures and close PCP follow up for aggressive stroke risk factor management  2. Atrial fibrillation, new dx: a. Likely stroke etiology b. CHA2DS2-VASc score 7 - Continue Eliquis per cardiology 3. HTN: BP goal <130/90.  Controlled on amlodipine, carvedilol, furosemide and telmisartan per PCP/cards 4. HLD: LDL goal <70.  Prior LDL 68.  On Crestor 20 mg daily per PCP - has f/u visit next week with PCP   Overall stable from stroke  standpoint routinely followed by PCP and cardiology for secondary stroke prevention measures therefore recommend follow-up on an as-needed basis.  Patient and daughter agreed    CC:  [facility] provider: Dr. Maureen Chatters, Cammie, MD (Inactive)    I spent 30 minutes of face-to-face and non-face-to-face time with patient and daughter.  This included previsit chart review, lab review, study review, order entry, electronic health record documentation, patient education regarding prior stroke and etiology, importance of managing stroke risk factors and answered all other questions to patient and daughters satisfaction   Frann Rider, [facility]  [facility] [address] [place], [place] [address]  Phone [phone]  Fax [phone] Note: This document was prepared with digital dictation and possible smart phrase technology. Any transcriptional errors that result from this process are unintentional.

## 2020-12-23 NOTE — Progress Notes (Signed)
I agree with the above plan 

## 2021-01-02 NOTE — Progress Notes (Signed)
Subjective:    Patient ID: Jillian [name], female    DOB: 1936-04-01, 85 y.o.   MRN: [id number]  84 y.o.F PCP pt of [facility].  Here to transition to new PCP Hx of CHF, HTN, CVA, CKD stage 4, prediabetes  [name]/21/2022 This patient is seen today to establish and transition to me for a new primary care relationship.  She is a former Jillian [name] patient.  Patient has history of stroke CKD stage IV obesity hypertension hyperlipidemia.  Patient has had recent follow-ups with neurology and cardiology as documented below  Hx of CVA  Recent Neuro visit [name]/7/22:  Today, 12/22/2020, Jillian [name] returns for 46-monthstroke follow-up accompanied by her daughter.  Stable from stroke standpoint without new or recurring stroke/TIA symptoms and denies residual deficits.  She denies any recent falls.  She will use wheelchair for [place] but no AD needed around her home.  reports ongoing compliance with Eliquis and Crestor without side effects.  Blood pressure today 141/60.  Occasionally monitors at home which has been stable (recently took AM BP meds).  No further concerns at this time.   History provided for reference purposes only Initial visit 08/20/2020 JM: Jillian [name] being seen for hospital follow-up accompanied by her daughter and grandson.  Per daughter, she initially recovered well without residual deficits but had a fall on Saturday after stepping on her grandsons toy and had worsening slurred speech and left facial weakness until the following morning returning back to baseline.  Denies head injury or any other physical injury.  Denies loss of balance or extremity weakness contributing to fall.  No residual deficits at this time.  Denies new stroke/TIA symptoms. At follow-up visit with cardiology, she was found to be in atrial fibrillation and initiated Eliquis with discontinuing aspirin but has continued on Plavix.  Currently remains on Eliquis and Plavix without bleeding or  bruising.  Remains on Crestor without myalgias.  Blood pressure today 124/59. Monitors at home and typically 140s.  No concerns at this time.  Stroke admission 07/09/2020 [name] a 85y.o.femalewith history of HTN, HLD, CHF, prior stroke (per patient-no residual deficits), CKD 4, and obesitywho presented to the hospital for shortness of breath on 07/09/2020.The pt was brought in by EMS - had breathing difficulties en route - received a nebulizer treatment and CPAP then became hypertensive - SBP 210. Shortly thereafter developed speech difficulties.  Stroke work-up revealed right MCA territory infarcts, embolic secondary to unknown source.  TEE not recommended given advanced age and low yield.  Recommended placement of loop recorder to assess for possible atrial fibrillation but patient and daughter declined and advised to follow-up with cardiology outpatient to discuss further.  Initiated aspirin and Brilinta; Plavix PTA.  Hypertensive emergency upon arrival with SBP 210 stabilize during admission and resumed home meds of amlodipine and carvedilol.  LDL 68 and continued home dose Crestor 20 mg daily.  Other stroke risk factors include advanced age, prior history of stroke and history of CHF. Evaluated by therapy and recommended discharge to SNF for ongoing therapy needs but patient and family declined SNF placement and discharged home with recommendation of home health therapy  Stroke: Rt MCA territory infarcts - embolic - source unknown.  Code Stroke CT Head - not ordered  CT head- No acute intracranial hemorrhage.Moderate age-related atrophy and chronic microvascular ischemic changes. Areas of old infarct.  MRI head- Numerous acute infarctions scattered within the right hemisphere within the right middle cerebral  artery territory.  MRA head- Motion degraded. No proximal intracranial vessel occlusion or definite significant stenosis.   Carotid Doppler- unremarkable  2D Echo  -normal ejection fraction 55 to 60%. No cardiac source embolism.   Will not pursueTEEin this 85yo female given low yield and test risk  Recommendloopto look for AF as source of stroke. EP has already met with she and her daughter which decline loop at this time. They will follow up w ith Jillian [name] in [name] weeks and can discuss further.   Jillian [name] - negative  LDL - 68  HgbA1c-6.3  UDS - not ordered  VTE prophylaxis - [place] Heparine  clopidogrel 75 mg dailyprior to admission, now on aspirin 325 mg daily and clopidogrel 75 mg daily/ change to asprin and brilinta   Patient counseled to be compliant withherantithrombotic medications  Ongoing aggressive stroke risk factor management  Therapy recommendations: SNF recommended --> patient and family declined  Disposition: Home  Jillian [name] is a 85 y.o. year old female presented to hospital initially with shortness of breath on 8/25 becoming hypertensive with SBP 210 and shortly thereafter developed speech difficulties.  Stroke work-up revealed right MCA territory infarcts, embolic secondary to unknown source.  Initially declined loop recorder placement but at cardiology follow-up outpatient, she was found to be in atrial fibrillation and Eliquis initiated.  Vascular risk factors include new dx of atrial fibrillation, HTN, HLD, prior stroke and history of CHF.     1. Right MCA stroke:  a. Recovered well without residual deficit b. Continue Eliquis (apixaban) daily  and Crestor for secondary stroke prevention.   c. Discussed secondary stroke prevention measures and close PCP follow up for aggressive stroke risk factor management  [name]. Atrial fibrillation, new dx: a. Likely stroke etiology b. CHA2DS2-VASc score 7 - Continue Eliquis per cardiology 3. HTN: BP goal <130/90.  Controlled on amlodipine, carvedilol, furosemide and telmisartan per PCP/cards 4. HLD: LDL goal <70.  Prior LDL 68.  On Crestor 20 mg daily per PCP -  has f/u visit next week with PCP   Overall stable from stroke standpoint routinely followed by PCP and cardiology for secondary stroke prevention measures therefore recommend follow-up on an as-needed basis.  Patient and daughter agreed  Cards visit 12/11/20: 1. Chronic diastolic HF - doing well - weight is down - recheck her lab today.   [name]. Prior NSTEMI - demand ischemia - manage conservatively - no worrisome symptoms noted.   3. HTN - with LVH - just had her medicines.   4. CKD - lab today - she is on ARB therapy.   5. PAF - seems to be in sinus - her rate is fine - she is on anticoagulation  6. Complains of skin and hair issue - would defer to PCP/dermatology  7. Multiple infarcts on CT head - now on anticoagulation  8. HLD - on statin.   Today the patient is accompanied by her granddaughter.  She is in a wheelchair she comes in.  She has been waiting quite a while and is somewhat stressed on arrival blood pressures 171/78 and repeated is still quite elevated.  She states her blood pressures are being managed by her renal doctor Dr. Zenda Alpers.  She is on telmisartan 40 mg daily, potassium supplement, furosemide daily, Coreg 12.5 mg twice daily.  She had been on amlodipine this has been discontinued by nephrology.  She states at home her blood pressure ranges anywhere from 130 240/80 sometimes as low as 110  systolic.  She has a follow-up visit with nephrology in the coming weeks.  Patient also complains of severe dry skin on the hands and feet and states this causes some discomfort.  Also has issues with alopecia.  Patient is on an ARB and this is under direction by nephrology.  Past Medical History:  Diagnosis Date  . Acute CVA (cerebrovascular accident) ([place]) 07/12/2020  . Hypertension   . Renal disorder    CKD Stage IV  . Stroke [facility])      Family History  Problem Relation Age of Onset  . CAD Mother   . Breast cancer Mother      Social History    Socioeconomic History  . Marital status: Unknown    Spouse name: Not on file  . Number of children: Not on file  . Years of education: Not on file  . Highest education level: Not on file  Occupational History  . Not on file  Tobacco Use  . Smoking status: Never Smoker  . Smokeless tobacco: Never Used  Substance and Sexual Activity  . Alcohol use: Not on file  . Drug use: Not on file  . Sexual activity: Not on file  Other Topics Concern  . Not on file  Social History Narrative  . Not on file   Social Determinants of Health   Financial Resource Strain: Not on file  Food Insecurity: Not on file  Transportation Needs: Not on file  Physical Activity: Not on file  Stress: Not on file  Social Connections: Not on file  Intimate Partner Violence: Not on file     Allergies  Allergen Reactions  . Penicillins Nausea Only  . Aspirin Other (See Comments)  . Atorvastatin Other (See Comments)    fatigue  . Diltiazem Hcl Nausea And Vomiting     Outpatient Medications Prior to Visit  Medication Sig Dispense Refill  . apixaban (ELIQUIS) [name].5 MG TABS tablet Take 1 tablet ([name].5 mg total) by mouth [name] (two) times daily. 60 tablet 6  . carvedilol (COREG) 12.5 MG tablet Take 1 tablet (12.5 mg total) by mouth [name] (two) times daily. 60 tablet 6  . Ferrous Sulfate (IRON) 325 (65 Fe) MG TABS Take 1 tablet by mouth daily.    . furosemide (LASIX) 40 MG tablet Take 1.5 tablets (60 mg total) by mouth daily. 45 tablet 6  . potassium chloride SA (KLOR-CON) 20 MEQ tablet Take 1 tablet (20 mEq total) by mouth daily. 30 tablet 6  . rosuvastatin (CRESTOR) 20 MG tablet Take 1 tablet (20 mg total) by mouth daily. To lower cholesterol 30 tablet 6  . telmisartan (MICARDIS) 20 MG tablet Take 1 tablet (20 mg total) by mouth daily. (Patient taking differently: Take 40 mg by mouth daily.) 30 tablet 6  . amLODipine (NORVASC) 5 MG tablet Take 1 tablet (5 mg total) by mouth daily. (Patient not taking: Reported on  [name]/21/2022) 90 tablet 1   No facility-administered medications prior to visit.      Review of Systems  HENT: Positive for dental problem.   Eyes: Negative.   Respiratory: Negative.   Cardiovascular: Negative.   Gastrointestinal: Negative.   Genitourinary: Negative.   Musculoskeletal: Positive for arthralgias, back pain and gait problem.  Skin: Positive for rash.       Dry skin  Hematological: Negative.   Psychiatric/Behavioral: Negative.        Objective:   Physical Exam Vitals:   01/05/21 0905  BP: (!) 171/78  Pulse: (!) 59  SpO2: 100%  Weight: 189 lb (85.7 kg)  Height: _0  (1.549 m)    Gen: Pleasant, well-nourished, in no distress,  normal affect  ENT: No lesions,  mouth clear,  oropharynx clear, no postnasal drip, poor dentition  Neck: No JVD, no TMG, no carotid bruits  Lungs: No use of accessory muscles, no dullness to percussion, clear without rales or rhonchi  Cardiovascular: RRR, heart sounds normal, no murmur or gallops, no peripheral edema  Abdomen: soft and NT, no HSM,  BS normal  Musculoskeletal: No deformities, no cyanosis or clubbing  Neuro: alert, non focal  Skin: Warm, extremely dry skin poor skin turgor, alopecia areata  Left foot great toe shows an area of bruising around the toenail where the patient clipped the toe to closely        Assessment & Plan:  I personally reviewed all images and lab data in the [facility] system as well as any outside material available during this office visit and agree with the  radiology impressions.   Benign essential hypertension Hypertension appears to be controlled at home here in the office the patient is somewhat stressed to have systolic hypertension.  She appears very dry on exam and I am not going to change medications as she is being managed in this regard by nephrology.  I will check a metabolic profile and share results with nephrology I have given refills on her furosemide, telmisartan, and  Coreg Will have the patient come back in for blood pressure recheck in [name] weeks and see me again in [name] months   Chronic diastolic heart failure ([place]) As per cardiology  Hypertension with impaired renal function Follow-up metabolic profile  Alopecia Trial of topical steroid to scalp  CKD (chronic kidney disease) stage 4, GFR 15-29 ml/min (HCC) Follow-up metabolic panel  History of CVA (cerebrovascular accident) Continue low-dose apixaban with history of intermittent atrial fibrillation Note patient is now off aspirin and Plavix per neurology  Pain in both feet Given recommendations for skin moisturization Referral to podiatry made  Mixed dyslipidemia Continue Crestor   Khristie was seen today for establish care.  Diagnoses and all orders for this visit:  Chronic diastolic heart failure (HCC) -     Basic Metabolic Panel  Alopecia -     Discontinue: desoximetasone (TOPICORT) 0.25 % cream  Pain in both feet -     Ambulatory referral to Podiatry  Hypertension with impaired renal function  History of CVA (cerebrovascular accident)  Benign essential hypertension  CKD (chronic kidney disease) stage 4, GFR 15-29 ml/min (HCC)  Mixed dyslipidemia  Other orders -     desoximetasone (TOPICORT) 0.25 % cream; Apply 1 application topically [name] (two) times daily.   Patient declined Pneumovax and tetanus vaccine this visit

## 2021-01-05 ENCOUNTER — Encounter: Payer: Self-pay | Admitting: Critical Care Medicine

## 2021-01-05 ENCOUNTER — Other Ambulatory Visit: Payer: Self-pay

## 2021-01-05 ENCOUNTER — Ambulatory Visit: Payer: Medicare Other | Attending: Critical Care Medicine | Admitting: Critical Care Medicine

## 2021-01-05 VITALS — BP 171/78 | HR 59 | Ht 61.0 in | Wt 189.0 lb

## 2021-01-05 DIAGNOSIS — I10 Essential (primary) hypertension: Secondary | ICD-10-CM

## 2021-01-05 DIAGNOSIS — N184 Chronic kidney disease, stage 4 (severe): Secondary | ICD-10-CM

## 2021-01-05 DIAGNOSIS — M79671 Pain in right foot: Secondary | ICD-10-CM | POA: Insufficient documentation

## 2021-01-05 DIAGNOSIS — E782 Mixed hyperlipidemia: Secondary | ICD-10-CM

## 2021-01-05 DIAGNOSIS — Z8673 Personal history of transient ischemic attack (TIA), and cerebral infarction without residual deficits: Secondary | ICD-10-CM

## 2021-01-05 DIAGNOSIS — I5032 Chronic diastolic (congestive) heart failure: Secondary | ICD-10-CM

## 2021-01-05 DIAGNOSIS — L659 Nonscarring hair loss, unspecified: Secondary | ICD-10-CM | POA: Insufficient documentation

## 2021-01-05 DIAGNOSIS — I129 Hypertensive chronic kidney disease with stage 1 through stage 4 chronic kidney disease, or unspecified chronic kidney disease: Secondary | ICD-10-CM | POA: Diagnosis not present

## 2021-01-05 DIAGNOSIS — M79672 Pain in left foot: Secondary | ICD-10-CM

## 2021-01-05 MED ORDER — DESOXIMETASONE 0.25 % EX CREA: 1.0000 "application " | TOPICAL_CREAM | Freq: Two times a day (BID) | CUTANEOUS | 0 refills | Status: DC

## 2021-01-05 MED ORDER — DESOXIMETASONE 0.25 % EX CREA: TOPICAL_CREAM | Freq: Two times a day (BID) | CUTANEOUS | Status: DC

## 2021-01-05 NOTE — Assessment & Plan Note (Signed)
Follow-up metabolic panel 

## 2021-01-05 NOTE — Assessment & Plan Note (Signed)
As per cardiology 

## 2021-01-05 NOTE — Progress Notes (Signed)
Wants to discuss kidney function Needs medication refill.

## 2021-01-05 NOTE — Assessment & Plan Note (Signed)
Given recommendations for skin moisturization Referral to podiatry made

## 2021-01-05 NOTE — Assessment & Plan Note (Signed)
Continue low-dose apixaban with history of intermittent atrial fibrillation Note patient is now off aspirin and Plavix per neurology

## 2021-01-05 NOTE — Assessment & Plan Note (Addendum)
Hypertension appears to be controlled at home here in the office the patient is somewhat stressed to have systolic hypertension.  She appears very dry on exam and I am not going to change medications as she is being managed in this regard by nephrology.  I will check a metabolic profile and share results with nephrology I have given refills on her furosemide, telmisartan, and Coreg Will have the patient come back in for blood pressure recheck in 2 weeks and see me again in 2 months

## 2021-01-05 NOTE — Assessment & Plan Note (Signed)
Trial of topical steroid to scalp

## 2021-01-05 NOTE — Patient Instructions (Addendum)
No change in medications  Continue use skin moisturizer on hands and feet  Referral to podiatry made   Labs today include metabolic panel  Topicort cream given to the scalp for hair loss use this twice daily this was sent to your pharmacy  Return to Dr. Joya Gaskins 4 months  Handicap sticker given

## 2021-01-05 NOTE — Assessment & Plan Note (Signed)
Continue Crestor 

## 2021-01-05 NOTE — Assessment & Plan Note (Signed)
Follow-up metabolic profile

## 2021-01-06 ENCOUNTER — Other Ambulatory Visit: Payer: Self-pay | Admitting: Critical Care Medicine

## 2021-01-06 LAB — BASIC METABOLIC PANEL
BUN/Creatinine Ratio: 20 (ref 12–28)
BUN: 45 mg/dL — ABNORMAL HIGH (ref 8–27)
CO2: 22 mmol/L (ref 20–29)
Calcium: 9.1 mg/dL (ref 8.7–10.3)
Chloride: 102 mmol/L (ref 96–106)
Creatinine, Ser: 2.2 mg/dL — ABNORMAL HIGH (ref 0.57–1.00)
GFR calc Af Amer: 23 mL/min/{1.73_m2} — ABNORMAL LOW (ref >59)
GFR calc non Af Amer: 20 mL/min/{1.73_m2} — ABNORMAL LOW (ref >59)
Glucose: 94 mg/dL (ref 65–99)
Potassium: 5 mmol/L (ref 3.5–5.2)
Sodium: 140 mmol/L (ref 134–144)

## 2021-01-06 MED ORDER — FUROSEMIDE 40 MG PO TABS: 40.0000 mg | ORAL_TABLET | Freq: Every day | ORAL | 6 refills | Status: DC

## 2021-01-06 MED ORDER — POTASSIUM CHLORIDE CRYS ER 20 MEQ PO TBCR: EXTENDED_RELEASE_TABLET | ORAL | 6 refills | Status: DC

## 2021-01-20 ENCOUNTER — Encounter: Payer: Self-pay | Admitting: Podiatry

## 2021-01-20 ENCOUNTER — Other Ambulatory Visit: Payer: Self-pay

## 2021-01-20 ENCOUNTER — Ambulatory Visit: Payer: Medicare Other | Admitting: Podiatry

## 2021-01-20 ENCOUNTER — Ambulatory Visit: Payer: Medicare Other

## 2021-01-20 DIAGNOSIS — B351 Tinea unguium: Secondary | ICD-10-CM

## 2021-01-20 DIAGNOSIS — M79676 Pain in unspecified toe(s): Secondary | ICD-10-CM | POA: Diagnosis not present

## 2021-01-20 DIAGNOSIS — M778 Other enthesopathies, not elsewhere classified: Secondary | ICD-10-CM

## 2021-01-21 NOTE — Progress Notes (Signed)
  Subjective:  Patient ID: Jillian [name], female    DOB: 03/12/1936,  MRN: [id number] HPI Chief Complaint  Patient presents with  . Foot Pain    Plantar feet bilateral - concerned she has poor circulation, feet hurt even when not on them at night  . Debridement    Requesting nail care - unable to trim well herself  . New Patient (Initial Visit)    85 y.o. female presents with the above complaint.   ROS: Denies fever chills nausea vomiting muscle aches pains calf pain back pain chest pain shortness of breath.  Past Medical History:  Diagnosis Date  . Acute CVA (cerebrovascular accident) ([place]) 07/12/2020  . Hypertension   . Renal disorder    CKD Stage IV  . Stroke [facility])    Past Surgical History:  Procedure Laterality Date  . CARDIAC CATHETERIZATION  2009    Current Outpatient Medications:  .  apixaban (ELIQUIS) 2.5 MG TABS tablet, Take 1 tablet (2.5 mg total) by mouth 2 (two) times daily., Disp: 60 tablet, Rfl: 6 .  carvedilol (COREG) 12.5 MG tablet, Take 1 tablet (12.5 mg total) by mouth 2 (two) times daily., Disp: 60 tablet, Rfl: 6 .  desoximetasone (TOPICORT) 0.25 % cream, Apply 1 application topically 2 (two) times daily., Disp: 30 g, Rfl: 0 .  Ferrous Sulfate (IRON) 325 (65 Fe) MG TABS, Take 1 tablet by mouth daily., Disp: , Rfl:  .  furosemide (LASIX) 40 MG tablet, Take 1 tablet (40 mg total) by mouth daily., Disp: 45 tablet, Rfl: 6 .  potassium chloride SA (KLOR-CON) 20 MEQ tablet, HOLD, Disp: 30 tablet, Rfl: 6 .  rosuvastatin (CRESTOR) 20 MG tablet, Take 1 tablet (20 mg total) by mouth daily. To lower cholesterol, Disp: 30 tablet, Rfl: 6 .  telmisartan (MICARDIS) 20 MG tablet, Take 1 tablet (20 mg total) by mouth daily. (Patient taking differently: Take 40 mg by mouth daily.), Disp: 30 tablet, Rfl: 6  Allergies  Allergen Reactions  . Penicillins Nausea Only  . Aspirin Other (See Comments)  . Atorvastatin Other (See Comments)    fatigue  . Diltiazem Hcl Nausea And  Vomiting   Review of Systems Objective:  There were no vitals filed for this visit.  General: Well developed, nourished, in no acute distress, alert and oriented x3   Dermatological: Skin is warm, dry and supple bilateral. Nails x 10 are well maintained; remaining integument appears unremarkable at this time. There are no open sores, no preulcerative lesions, no rash or signs of infection present.  Vascular: Dorsalis Pedis artery and Posterior Tibial artery pedal pulses are 2/4 bilateral with immedate capillary fill time. Pedal hair growth present. No varicosities and no lower extremity edema present bilateral.   Neruologic: Grossly intact via light touch bilateral. Vibratory intact via tuning fork bilateral. Protective threshold with Semmes Wienstein monofilament intact to all pedal sites bilateral. Patellar and Achilles deep tendon reflexes 2+ bilateral. No Babinski or clonus noted bilateral.   Musculoskeletal: No gross boney pedal deformities bilateral. No pain, crepitus, or limitation noted with foot and ankle range of motion bilateral. Muscular strength 5/5 in all groups tested bilateral.  Gait: Unassisted, Nonantalgic.    Radiographs:  None taken  Assessment & Plan:   Assessment: Pain in limb secondary to onychomycosis.  Plan: Debridement of toenails 1 through 5 bilateral.  She will follow-up with Dr. Adah Perl for routine care     [place], [place]

## 2021-03-20 ENCOUNTER — Other Ambulatory Visit: Payer: Self-pay

## 2021-03-20 ENCOUNTER — Other Ambulatory Visit: Payer: Medicare Other | Admitting: *Deleted

## 2021-03-20 DIAGNOSIS — Z79899 Other long term (current) drug therapy: Secondary | ICD-10-CM

## 2021-03-20 LAB — CBC
Hematocrit: 32 % — ABNORMAL LOW (ref 34.0–46.6)
Hemoglobin: 10 g/dL — ABNORMAL LOW (ref 11.1–15.9)
MCH: 26.8 pg (ref 26.6–33.0)
MCHC: 31.3 g/dL — ABNORMAL LOW (ref 31.5–35.7)
MCV: 86 fL (ref 79–97)
Platelets: 223 10*3/uL (ref 150–450)
RBC: 3.73 x10E6/uL — ABNORMAL LOW (ref 3.77–5.28)
RDW: 12.8 % (ref 11.7–15.4)
WBC: 6.1 10*3/uL (ref 3.4–10.8)

## 2021-03-20 LAB — BASIC METABOLIC PANEL
BUN/Creatinine Ratio: 22 (ref 12–28)
BUN: 37 mg/dL — ABNORMAL HIGH (ref 8–27)
CO2: 25 mmol/L (ref 20–29)
Calcium: 8.9 mg/dL (ref 8.7–10.3)
Chloride: 106 mmol/L (ref 96–106)
Creatinine, Ser: 1.67 mg/dL — ABNORMAL HIGH (ref 0.57–1.00)
Glucose: 105 mg/dL — ABNORMAL HIGH (ref 65–99)
Potassium: 3.6 mmol/L (ref 3.5–5.2)
Sodium: 144 mmol/L (ref 134–144)
eGFR: 30 mL/min/{1.73_m2} — ABNORMAL LOW (ref >59)

## 2021-03-22 NOTE — Progress Notes (Deleted)
Cardiology Office Note   Date:  03/22/2021   ID:  Jillian [name], DOB Mar 19, 1936, MRN [id number]  PCP:  [name], MD  Cardiologist:   Dorris Carnes, MD   Pt presents for f/u of CHF    History of Present Illness: Jillian [name] is a 85 y.o. female with hx of HTN, CKD, CVA, HL  I saw her in the hospital in consult in Aug 2021  She presented with SOB/ chest tightness.  Troponin elevated, felt due to demand    Echo showed LVEF 55 to 60%  She diuresed    The pt was seen by EP on that admit given hx of CVA   She declined implant of loop recorder   The pt was seen by L Gerhardt on 9.20  BP noted to be high at times   Complained of swelling   Complained of feeling poorly  At that clinic visit she was found to be in afib   Eliquis was added and Plavix/ASA stopped  Coreg increased   SInce seen the pt's breathing is OK   Still complains of some LE swelling   She denies CP  She denies palpitaitons     I saw the pt in Dec 2021   She wa seen by Tommas Olp in Jan 2022    No outpatient medications have been marked as taking for the 03/24/21 encounter (Appointment) with Fay Records, MD.     Allergies:   Penicillins, Aspirin, Atorvastatin, and Diltiazem hcl   Past Medical History:  Diagnosis Date  . Acute CVA (cerebrovascular accident) ([place]) 07/12/2020  . Hypertension   . Renal disorder    CKD Stage IV  . Stroke [facility])     Past Surgical History:  Procedure Laterality Date  . CARDIAC CATHETERIZATION  2009     Social History:  The patient  reports that she has never smoked. She has never used smokeless tobacco.   Family History:  The patient's family history includes Breast cancer in her mother; CAD in her mother.    ROS:  Please see the history of present illness. All other systems are reviewed and  Negative to the above problem except as noted.    PHYSICAL EXAM: VS:  There were no vitals taken for this visit.  GEN: Obese 85 yo in NAD   HEENT: normal  Neck: JVP is not  elevated , carotid bruits Cardiac: RRR; no murmurs  Tr LE edema   Respiratory:  clear to auscultation bilaterally,  GI: soft, nontender, nondistended, + BS  MS: no deformity Moving all extremities   Skin: warm and dry, no rash Neuro:  Deferred   EKG:  EKG isnot  ordered today.  Lipid Panel    Component Value Date/Time   CHOL 116 07/13/2020 0459   TRIG 80 07/13/2020 0459   HDL 32 (L) 07/13/2020 0459   CHOLHDL 3.6 07/13/2020 0459   VLDL 16 07/13/2020 0459   LDLCALC 68 07/13/2020 0459      Wt Readings from Last 3 Encounters:  01/05/21 189 lb (85.7 kg)  12/22/20 182 lb 9.6 oz (82.8 kg)  12/11/20 182 lb (82.6 kg)      ASSESSMENT AND PLAN:  1  Chronic diastolic CHF   Volume status is not too bad   I would keep on current regimen of lasix    Check BMET and BNP today     2  PAF   Clinically in SR   Keep on Coreg and Eliquis  3  HTN  BP is mildly increased today   Follow     4  CKD  BMET today     5  HL  Keep on Crestor  Last LDL 68     Plan for f/u in Jan   Watch Na intake over holidays       Current medicines are reviewed at length with the patient today.  The patient does not have concerns regarding medicines.  Signed, Dorris Carnes, MD  03/22/2021 8:47 PM    [facility] [place], [place], [place]  [address] Phone: [phone]; Fax: [phone]

## 2021-03-24 ENCOUNTER — Ambulatory Visit: Payer: Medicare Other | Admitting: Internal Medicine

## 2021-04-23 ENCOUNTER — Telehealth: Payer: Self-pay | Admitting: Internal Medicine
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
## 2021-04-27 NOTE — Telephone Encounter (Signed)
Spoke with patient's daughter, scheduled follow up for pt 09/14/21 w Dr. Harrington Challenger.

## 2021-05-05 ENCOUNTER — Telehealth: Payer: Self-pay

## 2021-05-05 ENCOUNTER — Ambulatory Visit: Payer: Medicare Other | Admitting: Podiatry

## 2021-05-05 ENCOUNTER — Encounter: Payer: Self-pay | Admitting: Critical Care Medicine

## 2021-05-05 ENCOUNTER — Other Ambulatory Visit: Payer: Self-pay

## 2021-05-05 ENCOUNTER — Ambulatory Visit: Payer: Medicare Other | Attending: Critical Care Medicine | Admitting: Critical Care Medicine

## 2021-05-05 VITALS — BP 174/85 | HR 82 | Resp 16 | Ht 61.0 in | Wt 182.4 lb

## 2021-05-05 DIAGNOSIS — J309 Allergic rhinitis, unspecified: Secondary | ICD-10-CM

## 2021-05-05 DIAGNOSIS — I10 Essential (primary) hypertension: Secondary | ICD-10-CM | POA: Diagnosis not present

## 2021-05-05 DIAGNOSIS — E66812 Obesity, class 2: Secondary | ICD-10-CM

## 2021-05-05 DIAGNOSIS — I5032 Chronic diastolic (congestive) heart failure: Secondary | ICD-10-CM

## 2021-05-05 DIAGNOSIS — I129 Hypertensive chronic kidney disease with stage 1 through stage 4 chronic kidney disease, or unspecified chronic kidney disease: Secondary | ICD-10-CM

## 2021-05-05 DIAGNOSIS — N184 Chronic kidney disease, stage 4 (severe): Secondary | ICD-10-CM

## 2021-05-05 DIAGNOSIS — Z6836 Body mass index (BMI) 36.0-36.9, adult: Secondary | ICD-10-CM

## 2021-05-05 MED ORDER — CARVEDILOL 25 MG PO TABS: 25.0000 mg | ORAL_TABLET | Freq: Two times a day (BID) | ORAL | 6 refills | Status: DC

## 2021-05-05 MED ORDER — CLONIDINE HCL 0.1 MG PO TABS
0.1000 mg | ORAL_TABLET | Freq: Once | ORAL | Status: AC
  Administered 2021-05-05: 0.1 mg via ORAL

## 2021-05-05 MED ORDER — FLUTICASONE PROPIONATE 50 MCG/ACT NA SUSP: 2.0000 | Freq: Every day | NASAL | 6 refills | Status: DC

## 2021-05-05 NOTE — Assessment & Plan Note (Signed)
Hypertension poorly controlled at this visit  She did respond to 1 dose of clonidine 0.'1mg'$   Plan to increase Coreg to 25 mg twice daily continue myocarditis as prescribed continue furosemide as prescribed have the patient follow-up with nephrology  Metabolic profile obtained

## 2021-05-05 NOTE — Assessment & Plan Note (Signed)
Chronic recurrent allergic rhinitis and I did give the patient a fluticasone to use as needed for nasal congestion

## 2021-05-05 NOTE — Telephone Encounter (Signed)
Following up on a request from the patients PCP on resources for a oral surgeon. Case Manager spoke with patients daughter to share information for providers in the area covered under the patients health plan. Patients daughter asked they be sent through [place]. Information has been sent.

## 2021-05-05 NOTE — Assessment & Plan Note (Signed)
Patient needs follow-up visit with cardiology she has appointment scheduled in October and is on the call us for sooner visit

## 2021-05-05 NOTE — Assessment & Plan Note (Signed)
Check metabolic profile

## 2021-05-05 NOTE — Progress Notes (Signed)
Subjective:    Patient ID: Jillian [name], female    DOB: 1936/06/13, 85 y.o.   MRN: [id number]  85 y.o.F PCP pt of [facility].  Here to transition to new PCP Hx of CHF, HTN, CVA, CKD stage 4, prediabetes  01/05/2021 This patient is seen today to establish and transition to me for a new primary care relationship.  She is a former Dr. [name] patient.  Patient has history of stroke CKD stage IV obesity hypertension hyperlipidemia.  Patient has had recent follow-ups with neurology and cardiology as documented below  Hx of CVA  Recent Neuro visit 12/22/20:  Today, 12/22/2020, Jillian [name] returns for 61-month stroke follow-up accompanied by her daughter.  Stable from stroke standpoint without new or recurring stroke/TIA symptoms and denies residual deficits.  She denies any recent falls.  She will use wheelchair for long distance transport (due to DOE) but no AD needed around her home.  reports ongoing compliance with Eliquis and Crestor without side effects.  Blood pressure today 141/60.  Occasionally monitors at home which has been stable (recently took AM BP meds).  No further concerns at this time.     History provided for reference purposes only Initial visit 08/20/2020 JM: Ms. Cinque is being seen for hospital follow-up accompanied by her daughter and grandson.  Per daughter, she initially recovered well without residual deficits but had a fall on Saturday after stepping on her grandsons toy and had worsening slurred speech and left facial weakness until the following morning returning back to baseline.  Denies head injury or any other physical injury.  Denies loss of balance or extremity weakness contributing to fall.  No residual deficits at this time.  Denies new stroke/TIA symptoms. At follow-up visit with cardiology, she was found to be in atrial fibrillation and initiated Eliquis with discontinuing aspirin but has continued on Plavix.  Currently remains on Eliquis and Plavix without bleeding or  bruising.  Remains on Crestor without myalgias.  Blood pressure today 124/59. Monitors at home and typically 140s.  No concerns at this time.   Stroke admission 07/09/2020 Jillian [name] is a 85 y.o. female with history of HTN, HLD, CHF,  prior stroke (per patient-no residual deficits), CKD 4, and obesity who presented to the hospital for shortness of breath on 07/09/2020. The pt was brought in by EMS - had breathing difficulties en route - received a nebulizer treatment and CPAP then became hypertensive - SBP 210. Shortly thereafter developed speech difficulties.  Stroke work-up revealed right MCA territory infarcts, embolic secondary to unknown source.  TEE not recommended given advanced age and low yield.  Recommended placement of loop recorder to assess for possible atrial fibrillation but patient and daughter declined and advised to follow-up with cardiology outpatient to discuss further.  Initiated aspirin and Brilinta; Plavix PTA.  Hypertensive emergency upon arrival with SBP 210 stabilize during admission and resumed home meds of amlodipine and carvedilol.  LDL 68 and continued home dose Crestor 20 mg daily.  Other stroke risk factors include advanced age, prior history of stroke and history of CHF. Evaluated by therapy and recommended discharge to SNF for ongoing therapy needs but patient and family declined SNF placement and discharged home with recommendation of home health therapy   Stroke: Rt MCA territory infarcts - embolic - source unknown.  Code Stroke CT Head - not ordered  CT head -  No acute intracranial hemorrhage. Moderate age-related atrophy and chronic microvascular ischemic changes. Areas of old infarct.  MRI  head - Numerous acute infarctions scattered within the right hemisphere within the right middle cerebral artery territory.  MRA head - Motion degraded. No proximal intracranial vessel occlusion or definite significant stenosis.   Carotid Doppler - unremarkable  2D Echo  -normal ejection fraction 55 to 60%.  No cardiac source embolism.    Will not pursue TEE in this 85yo female given low yield and test risk  Recommend loop to look for AF as source of stroke. EP has already met with she and her daughter which decline loop at this time. They will follow up w ith Dr. Harrington Challenger in 2 weeks and can discuss further.   [employer] Virus 2 - negative  LDL - 68  HgbA1c - 6.3  UDS - not ordered  VTE prophylaxis - [place] Heparine  clopidogrel 75 mg daily prior to admission, now on aspirin 325 mg daily and clopidogrel 75 mg daily/ change to asprin and brilinta   Patient counseled to be compliant with her antithrombotic medications  Ongoing aggressive stroke risk factor management  Therapy recommendations:  SNF recommended --> patient and family declined  Disposition:  Home   Jillian [name] is a 85 y.o. year old female presented to hospital initially with shortness of breath on 8/25 becoming hypertensive with SBP 210 and shortly thereafter developed speech difficulties.  Stroke work-up revealed right MCA territory infarcts, embolic secondary to unknown source.  Initially declined loop recorder placement but at cardiology follow-up outpatient, she was found to be in atrial fibrillation and Eliquis initiated.  Vascular risk factors include new dx of atrial fibrillation, HTN, HLD, prior stroke and history of CHF.        1. Right MCA stroke:  a. Recovered well without residual deficit b. Continue Eliquis (apixaban) daily  and Crestor for secondary stroke prevention.   c. Discussed secondary stroke prevention measures and close PCP follow up for aggressive stroke risk factor management  2. Atrial fibrillation, new dx: a. Likely stroke etiology b. CHA2DS2-VASc score 7 - Continue Eliquis per cardiology 3. HTN: BP goal <130/90.  Controlled on amlodipine, carvedilol, furosemide and telmisartan per PCP/cards 4. HLD: LDL goal <70.  Prior LDL 68.  On Crestor 20 mg daily per PCP -  has f/u visit next week with PCP     Overall stable from stroke standpoint routinely followed by PCP and cardiology for secondary stroke prevention measures therefore recommend follow-up on an as-needed basis.  Patient and daughter agreed  Cards visit 12/11/20: 1. Chronic diastolic HF - doing well - weight is down - recheck her lab today.    2. Prior NSTEMI - demand ischemia - manage conservatively - no worrisome symptoms noted.    3. HTN - with LVH - just had her medicines.    4. CKD - lab today - she is on ARB therapy.    5. PAF - seems to be in sinus - her rate is fine - she is on anticoagulation   6. Complains of skin and hair issue - would defer to PCP/dermatology   7. Multiple infarcts on CT head - now on anticoagulation   8. HLD - on statin.      Today the patient is accompanied by her granddaughter.  She is in a wheelchair she comes in.  She has been waiting quite a while and is somewhat stressed on arrival blood pressures 171/78 and repeated is still quite elevated.  She states her blood pressures are being managed by her renal doctor Dr. Zenda Alpers.  She is on telmisartan 40 mg daily, potassium supplement, furosemide daily, Coreg 12.5 mg twice daily.  She had been on amlodipine this has been discontinued by nephrology.  She states at home her blood pressure ranges anywhere from 130 240/80 sometimes as low as 474 systolic.  She has a follow-up visit with nephrology in the coming weeks.  Patient also complains of severe dry skin on the hands and feet and states this causes some discomfort.  Also has issues with alopecia.  Patient is on an ARB and this is under direction by nephrology.  05/05/2021 Patient returns in follow-up for hypertension chronic diastolic heart failure chronic kidney disease stage IV prior stroke.  Patient is accompanied by her daughter and the patient is in a wheelchair.  Patient states she has been having more headaches and postnasal drainage numbness in the  hands and toes.  She has an appointment with nephrology in August to follow her chronic kidney disease she also remains somewhat constipated but it is relieved with Duca locks.  On arrival blood pressure is greater than 190/110.  Patient states she has been compliant with her blood pressure medications.  Her blood pressure has been managed by nephrology.  There are no other complaints at this visit  The patient does continue to resist any vaccinations and I removed her vaccination reminders off her care plan  Past Medical History:  Diagnosis Date   Acute CVA (cerebrovascular accident) ([place]) 07/12/2020   Hypertension    Renal disorder    CKD Stage IV   Stroke ([place])      Family History  Problem Relation Age of Onset   CAD Mother    Breast cancer Mother      Social History   Socioeconomic History   Marital status: Unknown    Spouse name: Not on file   Number of children: Not on file   Years of education: Not on file   Highest education level: Not on file  Occupational History   Not on file  Tobacco Use   Smoking status: Never   Smokeless tobacco: Never  Substance and Sexual Activity   Alcohol use: Not on file   Drug use: Not on file   Sexual activity: Not on file  Other Topics Concern   Not on file  Social History Narrative   Not on file   Social Determinants of Health   Financial Resource Strain: Not on file  Food Insecurity: Not on file  Transportation Needs: Not on file  Physical Activity: Not on file  Stress: Not on file  Social Connections: Not on file  Intimate Partner Violence: Not on file     Allergies  Allergen Reactions   Penicillins Nausea Only   Aspirin Other (See Comments)   Atorvastatin Other (See Comments)    fatigue   Diltiazem Hcl Nausea And Vomiting     Outpatient Medications Prior to Visit  Medication Sig Dispense Refill   apixaban (ELIQUIS) 2.5 MG TABS tablet Take 1 tablet (2.5 mg total) by mouth 2 (two) times daily. 60 tablet 6    desoximetasone (TOPICORT) 0.25 % cream Apply 1 application topically 2 (two) times daily. 30 g 0   Ferrous Sulfate (IRON) 325 (65 Fe) MG TABS Take 1 tablet by mouth daily.     furosemide (LASIX) 40 MG tablet Take 1 tablet (40 mg total) by mouth daily. 45 tablet 6   rosuvastatin (CRESTOR) 20 MG tablet Take 1 tablet (20 mg total) by mouth daily. To lower  cholesterol 30 tablet 6   carvedilol (COREG) 12.5 MG tablet Take 1 tablet (12.5 mg total) by mouth 2 (two) times daily. 60 tablet 6   telmisartan (MICARDIS) 20 MG tablet Take 1 tablet (20 mg total) by mouth daily. (Patient taking differently: Take 40 mg by mouth daily.) 30 tablet 6   potassium chloride SA (KLOR-CON) 20 MEQ tablet HOLD (Patient not taking: Reported on 05/05/2021) 30 tablet 6   telmisartan (MICARDIS) 40 MG tablet Take 40 mg by mouth daily.     No facility-administered medications prior to visit.      Review of Systems  HENT:  Positive for dental problem, postnasal drip, rhinorrhea, sinus pressure and sinus pain.   Eyes: Negative.   Respiratory: Negative.  Negative for cough, chest tightness, shortness of breath and wheezing.   Cardiovascular: Negative.  Negative for chest pain, palpitations and leg swelling.  Gastrointestinal:  Positive for constipation.  Genitourinary: Negative.   Musculoskeletal:  Positive for arthralgias, back pain and gait problem.  Skin:  Positive for rash.       Dry skin  Neurological:  Positive for headaches. Negative for dizziness, tremors, weakness, light-headedness and numbness.  Hematological: Negative.   Psychiatric/Behavioral: Negative.        Objective:   Physical Exam Vitals:   05/05/21 0859 05/05/21 1002  BP: (!) 198/84 (!) 174/85  Pulse: 82   Resp: 16   SpO2: 97%   Weight: 182 lb 6.4 oz (82.7 kg)   Height: $Remove'5\' 1"'[id number]$  (1.549 m)     Gen: Pleasant, well-nourished, in no distress,  normal affect  ENT: No lesions,  mouth clear,  oropharynx clear, no postnasal drip, poor dentition  Neck:  No JVD, no TMG, no carotid bruits  Lungs: No use of accessory muscles, no dullness to percussion, clear without rales or rhonchi  Cardiovascular: RRR, heart sounds normal, no murmur or gallops, no peripheral edema  Abdomen: soft and NT, no HSM,  BS normal  Musculoskeletal: No deformities, no cyanosis or clubbing  Neuro: alert, non focal  Skin: Warm, extremely dry skin poor skin turgor, alopecia areata  Patient was given 0.1 mg of clonidine repeat blood pressure an hour later showed 174/85 we will let the patient go at that point.     Assessment & Plan:  I personally reviewed all images and lab data in the [facility] system as well as any outside material available during this office visit and agree with the  radiology impressions.   Benign essential hypertension Hypertension poorly controlled at this visit  She did respond to 1 dose of clonidine 0.$RemoveBefore'1mg'[id number]$   Plan to increase Coreg to 25 mg twice daily continue myocarditis as prescribed continue furosemide as prescribed have the patient follow-up with nephrology  Metabolic profile obtained  Allergic rhinitis Chronic recurrent allergic rhinitis and I did give the patient a fluticasone to use as needed for nasal congestion  CKD (chronic kidney disease) stage 4, GFR 15-29 ml/min (HCC) Check metabolic profile  Chronic diastolic heart failure [facility]) Patient needs follow-up visit with cardiology she has appointment scheduled in October and is on the call us for sooner visit   Yaret was seen today for new patient (initial visit).  Diagnoses and all orders for this visit:  Hypertension with impaired renal function -     cloNIDine (CATAPRES) tablet 0.1 mg -     Basic Metabolic Panel  Class 2 severe obesity due to excess calories with serious comorbidity and body mass index (BMI) of 36.0 to 36.9 in  adult [facility])  CKD (chronic kidney disease) stage 4, GFR 15-29 ml/min (HCC) -     Basic Metabolic Panel  Benign essential hypertension  Allergic  rhinitis, unspecified seasonality, unspecified trigger  Chronic diastolic heart failure (HCC)  Other orders -     carvedilol (COREG) 25 MG tablet; Take 1 tablet (25 mg total) by mouth 2 (two) times daily. -     fluticasone (FLONASE) 50 MCG/ACT nasal spray; Place 2 sprays into both nostrils daily.

## 2021-05-05 NOTE — Patient Instructions (Addendum)
Increase Coreg to 25 mg twice daily  Begin flonase two puff daily each nostril for nasal congestion  No antibiotics needed  We gave you one dose of clonidine for your blood pressure today  Labs today :  metabolic panel   Return to Dr Joya Gaskins 3 months  Keep cardiology and kidney doctor follow up appointments  Continue healthy sourced diet as attached

## 2021-05-06 LAB — BASIC METABOLIC PANEL
BUN/Creatinine Ratio: 14 (ref 12–28)
BUN: 23 mg/dL (ref 8–27)
CO2: 24 mmol/L (ref 20–29)
Calcium: 9.1 mg/dL (ref 8.7–10.3)
Chloride: 99 mmol/L (ref 96–106)
Creatinine, Ser: 1.64 mg/dL — ABNORMAL HIGH (ref 0.57–1.00)
Glucose: 103 mg/dL — ABNORMAL HIGH (ref 65–99)
Potassium: 3.3 mmol/L — ABNORMAL LOW (ref 3.5–5.2)
Sodium: 142 mmol/L (ref 134–144)
eGFR: 31 mL/min/{1.73_m2} — ABNORMAL LOW (ref >59)

## 2021-05-07 ENCOUNTER — Telehealth: Payer: Self-pay

## 2021-05-07 NOTE — Telephone Encounter (Signed)
Contacted pt to go over lab results pt didn't answer lvm  

## 2021-06-22 DIAGNOSIS — D631 Anemia in chronic kidney disease: Secondary | ICD-10-CM | POA: Diagnosis not present

## 2021-06-22 DIAGNOSIS — N184 Chronic kidney disease, stage 4 (severe): Secondary | ICD-10-CM | POA: Diagnosis not present

## 2021-06-22 DIAGNOSIS — I129 Hypertensive chronic kidney disease with stage 1 through stage 4 chronic kidney disease, or unspecified chronic kidney disease: Secondary | ICD-10-CM | POA: Diagnosis not present

## 2021-06-22 DIAGNOSIS — E785 Hyperlipidemia, unspecified: Secondary | ICD-10-CM | POA: Diagnosis not present

## 2021-06-22 DIAGNOSIS — I5030 Unspecified diastolic (congestive) heart failure: Secondary | ICD-10-CM | POA: Diagnosis not present

## 2021-06-22 DIAGNOSIS — I2510 Atherosclerotic heart disease of native coronary artery without angina pectoris: Secondary | ICD-10-CM | POA: Diagnosis not present

## 2021-07-07 ENCOUNTER — Emergency Department (HOSPITAL_COMMUNITY): Payer: Medicare Other

## 2021-07-07 ENCOUNTER — Encounter (HOSPITAL_COMMUNITY): Payer: Self-pay | Admitting: Emergency Medicine

## 2021-07-07 ENCOUNTER — Other Ambulatory Visit: Payer: Self-pay

## 2021-07-07 ENCOUNTER — Observation Stay (HOSPITAL_COMMUNITY)
Admission: EM | Admit: 2021-07-07 | Discharge: 2021-07-08 | Disposition: A | Discharge status: Home / Self Care | Payer: Medicare Other | Attending: Internal Medicine | Admitting: Internal Medicine

## 2021-07-07 DIAGNOSIS — I2510 Atherosclerotic heart disease of native coronary artery without angina pectoris: Secondary | ICD-10-CM | POA: Diagnosis not present

## 2021-07-07 DIAGNOSIS — I517 Cardiomegaly: Secondary | ICD-10-CM | POA: Diagnosis not present

## 2021-07-07 DIAGNOSIS — I160 Hypertensive urgency: Secondary | ICD-10-CM | POA: Diagnosis present

## 2021-07-07 DIAGNOSIS — Z20822 Contact with and (suspected) exposure to covid-19: Secondary | ICD-10-CM | POA: Diagnosis not present

## 2021-07-07 DIAGNOSIS — Z79899 Other long term (current) drug therapy: Secondary | ICD-10-CM | POA: Diagnosis not present

## 2021-07-07 DIAGNOSIS — Z7901 Long term (current) use of anticoagulants: Secondary | ICD-10-CM | POA: Diagnosis not present

## 2021-07-07 DIAGNOSIS — I509 Heart failure, unspecified: Secondary | ICD-10-CM

## 2021-07-07 DIAGNOSIS — I5033 Acute on chronic diastolic (congestive) heart failure: Secondary | ICD-10-CM | POA: Diagnosis not present

## 2021-07-07 DIAGNOSIS — N184 Chronic kidney disease, stage 4 (severe): Secondary | ICD-10-CM | POA: Insufficient documentation

## 2021-07-07 DIAGNOSIS — R7303 Prediabetes: Secondary | ICD-10-CM | POA: Diagnosis not present

## 2021-07-07 DIAGNOSIS — R0602 Shortness of breath: Secondary | ICD-10-CM | POA: Diagnosis not present

## 2021-07-07 DIAGNOSIS — I110 Hypertensive heart disease with heart failure: Secondary | ICD-10-CM | POA: Diagnosis not present

## 2021-07-07 DIAGNOSIS — I130 Hypertensive heart and chronic kidney disease with heart failure and stage 1 through stage 4 chronic kidney disease, or unspecified chronic kidney disease: Secondary | ICD-10-CM | POA: Diagnosis not present

## 2021-07-07 LAB — SARS CORONAVIRUS 2 (TAT 6-24 HRS): SARS Coronavirus 2: NEGATIVE

## 2021-07-07 LAB — COMPREHENSIVE METABOLIC PANEL
ALT: 25 U/L (ref 0–44)
AST: 26 U/L (ref 15–41)
Albumin: 3.2 g/dL — ABNORMAL LOW (ref 3.5–5.0)
Alkaline Phosphatase: 167 U/L — ABNORMAL HIGH (ref 38–126)
Anion gap: 9 (ref 5–15)
BUN: 21 mg/dL (ref 8–23)
CO2: 27 mmol/L (ref 22–32)
Calcium: 8.9 mg/dL (ref 8.9–10.3)
Chloride: 104 mmol/L (ref 98–111)
Creatinine, Ser: 1.97 mg/dL — ABNORMAL HIGH (ref 0.44–1.00)
GFR, Estimated: 25 mL/min — ABNORMAL LOW (ref >60)
Glucose, Bld: 128 mg/dL — ABNORMAL HIGH (ref 70–99)
Potassium: 3.3 mmol/L — ABNORMAL LOW (ref 3.5–5.1)
Sodium: 140 mmol/L (ref 135–145)
Total Bilirubin: 1.1 mg/dL (ref 0.3–1.2)
Total Protein: 7.9 g/dL (ref 6.5–8.1)

## 2021-07-07 LAB — CBC WITH DIFFERENTIAL/PLATELET
Abs Immature Granulocytes: 0.04 10*3/uL (ref 0.00–0.07)
Basophils Absolute: 0 10*3/uL (ref 0.0–0.1)
Basophils Relative: 0 %
Eosinophils Absolute: 0 10*3/uL (ref 0.0–0.5)
Eosinophils Relative: 1 %
HCT: 35.1 % — ABNORMAL LOW (ref 36.0–46.0)
Hemoglobin: 10.5 g/dL — ABNORMAL LOW (ref 12.0–15.0)
Immature Granulocytes: 1 %
Lymphocytes Relative: 18 %
Lymphs Abs: 1.5 10*3/uL (ref 0.7–4.0)
MCH: 26.6 pg (ref 26.0–34.0)
MCHC: 29.9 g/dL — ABNORMAL LOW (ref 30.0–36.0)
MCV: 89.1 fL (ref 80.0–100.0)
Monocytes Absolute: 0.6 10*3/uL (ref 0.1–1.0)
Monocytes Relative: 7 %
Neutro Abs: 6.3 10*3/uL (ref 1.7–7.7)
Neutrophils Relative %: 73 %
Platelets: 209 10*3/uL (ref 150–400)
RBC: 3.94 MIL/uL (ref 3.87–5.11)
RDW: 13.9 % (ref 11.5–15.5)
WBC: 8.6 10*3/uL (ref 4.0–10.5)
nRBC: 0 % (ref 0.0–0.2)

## 2021-07-07 LAB — TROPONIN I (HIGH SENSITIVITY)
Troponin I (High Sensitivity): 62 ng/L — ABNORMAL HIGH (ref <18)
Troponin I (High Sensitivity): 68 ng/L — ABNORMAL HIGH (ref <18)

## 2021-07-07 LAB — BRAIN NATRIURETIC PEPTIDE: B Natriuretic Peptide: 722.5 pg/mL — ABNORMAL HIGH (ref 0.0–100.0)

## 2021-07-07 MED ORDER — HYDRALAZINE HCL 20 MG/ML IJ SOLN
10.0000 mg | Freq: Once | INTRAMUSCULAR | Status: AC
  Administered 2021-07-07: 10 mg via INTRAVENOUS
  Filled 2021-07-07: qty 1

## 2021-07-07 MED ORDER — ACETAMINOPHEN 325 MG PO TABS: 650.0000 mg | ORAL_TABLET | Freq: Four times a day (QID) | ORAL | Status: DC | PRN

## 2021-07-07 MED ORDER — ROSUVASTATIN CALCIUM 20 MG PO TABS
20.0000 mg | ORAL_TABLET | Freq: Every day | ORAL | Status: DC
  Administered 2021-07-07 – 2021-07-08 (×2): 20 mg via ORAL
  Filled 2021-07-07 (×2): qty 1

## 2021-07-07 MED ORDER — APIXABAN 2.5 MG PO TABS
2.5000 mg | ORAL_TABLET | Freq: Two times a day (BID) | ORAL | Status: DC
  Administered 2021-07-07 – 2021-07-08 (×2): 2.5 mg via ORAL
  Filled 2021-07-07 (×2): qty 1

## 2021-07-07 MED ORDER — CARVEDILOL 25 MG PO TABS
25.0000 mg | ORAL_TABLET | Freq: Two times a day (BID) | ORAL | Status: DC
  Administered 2021-07-07 – 2021-07-08 (×2): 25 mg via ORAL
  Filled 2021-07-07 (×2): qty 1

## 2021-07-07 MED ORDER — FUROSEMIDE 10 MG/ML IJ SOLN
40.0000 mg | Freq: Once | INTRAMUSCULAR | Status: AC
  Administered 2021-07-07: 40 mg via INTRAVENOUS
  Filled 2021-07-07: qty 4

## 2021-07-07 MED ORDER — LABETALOL HCL 5 MG/ML IV SOLN: 5.0000 mg | INTRAVENOUS | Status: DC | PRN

## 2021-07-07 MED ORDER — ACETAMINOPHEN 650 MG RE SUPP: 650.0000 mg | Freq: Four times a day (QID) | RECTAL | Status: DC | PRN

## 2021-07-07 MED ORDER — IRBESARTAN 150 MG PO TABS
150.0000 mg | ORAL_TABLET | Freq: Every day | ORAL | Status: DC
  Administered 2021-07-07 – 2021-07-08 (×2): 150 mg via ORAL
  Filled 2021-07-07 (×2): qty 1

## 2021-07-07 MED ORDER — FUROSEMIDE 40 MG PO TABS
60.0000 mg | ORAL_TABLET | Freq: Every day | ORAL | Status: DC
  Administered 2021-07-07 – 2021-07-08 (×2): 60 mg via ORAL
  Filled 2021-07-07: qty 1
  Filled 2021-07-07: qty 3

## 2021-07-07 NOTE — ED Notes (Signed)
Dinner tray delivered.

## 2021-07-07 NOTE — ED Provider Notes (Signed)
Emergency Medicine Provider Triage Evaluation Note  Jillian [name] , a 85 y.o. female  was evaluated in triage.  Pt complains of gradual onset, constant, shortness of breath/orthopnea that began yesterday. Pt reports she took her Lasix this morning without relief and feels like it worsened her SOB. She also mentions feeling constipated today - last had a BM yesterday. Denies specific chest pain. She reports she had a "heart attack" last year however per chart review admitted for a stroke and CHF exacerbation. No nausea, vomiting, diaphoresis.   Review of Systems  Positive: + SOB Negative: - chest pain, nausea, vomiting  Physical Exam  BP (!) 201/85   Pulse 77   Temp 98.2 F (36.8 C) (Oral)   Resp 16   SpO2 98%  Gen:   Awake, no distress   Resp:  Normal effort. Speaking in full sentences without difficulty.  MSK:   Moves extremities without difficulty  Other:    Medical Decision Making  Medically screening exam initiated at 10:26 AM.  Appropriate orders placed.  Bridget Hittinger was informed that the remainder of the evaluation will be completed by another provider, this initial triage assessment does not replace that evaluation, and the importance of remaining in the ED until their evaluation is complete.     Eustaquio Maize, PA-C 07/07/21 1028    Malvin Johns, MD 07/07/21 1055

## 2021-07-07 NOTE — ED Provider Notes (Addendum)
[facility] EMERGENCY DEPARTMENT Provider Note   CSN: [id number] Arrival date & time: 07/07/21  [id number]     History No chief complaint on file.   Jillian [name] is a 85 y.o. female with PMHx HTN, CKD stage IV, CVA who presents to the ED today with complaint of gradual onset, constant, shortness of breath/orthopnea that began yesterday. Pt reports she took her Lasix this morning without relief and feels like it worsened her SOB. She also mentions feeling constipated today - last had a BM yesterday. Denies specific chest pain. She reports she had a "heart attack" last year however per chart review admitted for a stroke and CHF exacerbation. No nausea, vomiting, diaphoresis.   Per chart review: Pt admitted 08/25 for CHF exacerbation and hypertensive urgency. She was brought in on CPAP however weaned to room air. BNP 285.1 at that time.   The history is provided by the patient and medical records.      Past Medical History:  Diagnosis Date   Acute CVA (cerebrovascular accident) ([place]) 07/12/2020   Hypertension    Renal disorder    CKD Stage IV   Stroke [facility])     Patient Active Problem List   Diagnosis Date Noted   Alopecia 01/05/2021   Pain in both feet 01/05/2021   Anemia due to stage 4 chronic kidney disease ([place]) 07/24/2020   Chronic diastolic heart failure ([place]) 07/24/2020   Hypertension with impaired renal function 07/24/2020   Mixed dyslipidemia 07/24/2020   Allergic rhinitis 07/24/2020   History of CVA (cerebrovascular accident) 07/24/2020   Prediabetes 07/24/2020   CKD (chronic kidney disease) stage 4, GFR 15-29 ml/min ([place]) 07/09/2020   Class 2 severe obesity due to excess calories with serious comorbidity and body mass index (BMI) of 36.0 to 36.9 in adult ([place]) 10/28/2016   Allergic conjunctivitis of both eyes 05/27/2015   Benign essential hypertension 11/15/1898   Cerebral artery occlusion 11/15/1898    Past Surgical History:  Procedure Laterality  Date   CARDIAC CATHETERIZATION  2009     OB History   No obstetric history on file.     Family History  Problem Relation Age of Onset   CAD Mother    Breast cancer Mother     Social History   Tobacco Use   Smoking status: Never   Smokeless tobacco: Never    Home Medications Prior to Admission medications   Medication Sig Start Date End Date Taking? Authorizing Provider  apixaban (ELIQUIS) 2.5 MG TABS tablet Take 1 tablet (2.5 mg total) by mouth 2 (two) times daily. 12/11/20  Yes Burtis Junes, NP  carvedilol (COREG) 25 MG tablet Take 1 tablet (25 mg total) by mouth 2 (two) times daily. 05/05/21 08/03/21 Yes [name], MD  desoximetasone (TOPICORT) 0.25 % cream Apply 1 application topically 2 (two) times daily. Patient taking differently: Apply 1 application topically 2 (two) times daily as needed (rash). 01/05/21  Yes [name], MD  Ferrous Sulfate (IRON) 325 (65 Fe) MG TABS Take 1 tablet by mouth daily. 09/04/20  Yes [provider]  fluticasone (FLONASE) 50 MCG/ACT nasal spray Place 2 sprays into both nostrils daily. 05/05/21  Yes [name], MD  furosemide (LASIX) 40 MG tablet Take 1 tablet (40 mg total) by mouth daily. Patient taking differently: Take 60 mg by mouth daily. 01/06/21  Yes [name], MD  rosuvastatin (CRESTOR) 20 MG tablet Take 1 tablet (20 mg total) by mouth daily. To lower  cholesterol 12/11/20  Yes Burtis Junes, NP  telmisartan (MICARDIS) 40 MG tablet Take 40 mg by mouth daily. 05/03/21  Yes [provider]  potassium chloride SA (KLOR-CON) 20 MEQ tablet HOLD Patient not taking: No sig reported 01/06/21   [name], MD    Allergies    Penicillins, Aspirin, Atorvastatin, and Diltiazem hcl  Review of Systems   Review of Systems  Constitutional:  Negative for chills and fever.  Respiratory:  Positive for shortness of breath. Negative for cough.   Cardiovascular:  Negative for chest pain and leg  swelling.  Gastrointestinal:  Negative for nausea and vomiting.  All other systems reviewed and are negative.  Physical Exam Updated Vital Signs BP (!) 221/87   Pulse 81   Temp 98.2 F (36.8 C) (Oral)   Resp (!) 23   Ht '5\' 1"'$  (1.549 m)   Wt 82.7 kg   SpO2 99%   BMI 34.45 kg/m   Physical Exam Vitals and nursing note reviewed.  Constitutional:      Appearance: She is not ill-appearing or diaphoretic.  HENT:     Head: Normocephalic and atraumatic.  Eyes:     Conjunctiva/sclera: Conjunctivae normal.  Cardiovascular:     Rate and Rhythm: Normal rate and regular rhythm.     Pulses: Normal pulses.  Pulmonary:     Effort: Pulmonary effort is normal.     Breath sounds: Rales present. No wheezing or rhonchi.  Abdominal:     Palpations: Abdomen is soft.     Tenderness: There is no abdominal tenderness.  Musculoskeletal:     Cervical back: Neck supple.     Comments: Nonpitting edema bilaterally  Skin:    General: Skin is warm and dry.  Neurological:     Mental Status: She is alert.    ED Results / Procedures / Treatments   Labs (all labs ordered are listed, but only abnormal results are displayed) Labs Reviewed  COMPREHENSIVE METABOLIC PANEL - Abnormal; Notable for the following components:      Result Value   Potassium 3.3 (*)    Glucose, Bld 128 (*)    Creatinine, Ser 1.97 (*)    Albumin 3.2 (*)    Alkaline Phosphatase 167 (*)    GFR, Estimated 25 (*)    All other components within normal limits  CBC WITH DIFFERENTIAL/PLATELET - Abnormal; Notable for the following components:   Hemoglobin 10.5 (*)    HCT 35.1 (*)    MCHC 29.9 (*)    All other components within normal limits  BRAIN NATRIURETIC PEPTIDE - Abnormal; Notable for the following components:   B Natriuretic Peptide 722.5 (*)    All other components within normal limits  TROPONIN I (HIGH SENSITIVITY) - Abnormal; Notable for the following components:   Troponin I (High Sensitivity) 62 (*)    All other  components within normal limits  TROPONIN I (HIGH SENSITIVITY) - Abnormal; Notable for the following components:   Troponin I (High Sensitivity) 68 (*)    All other components within normal limits  SARS CORONAVIRUS 2 (TAT 6-24 HRS)    EKG EKG Interpretation  Date/Time:  Tuesday July 07 2021 10:35:59 EDT Ventricular Rate:  71 PR Interval:  138 QRS Duration: 102 QT Interval:  416 QTC Calculation: 452 R Axis:   47 Text Interpretation: Normal sinus rhythm Left ventricular hypertrophy with repolarization abnormality ( Sokolow-Lyon , Cornell product ) Abnormal ECG similar to EKG from same day Confirmed by Malvin Johns [phone]) on  07/07/2021 12:42:46 PM  Radiology DG Chest 2 View  Result Date: 07/07/2021 CLINICAL DATA:  Shortness of breath history of CHF EXAM: CHEST - 2 VIEW COMPARISON:  09/09/2020 FINDINGS: Rotated examination. Unchanged cardiomegaly and elevation of the left hemidiaphragm. Mild pulmonary vascular prominence without overt edema. Disc degenerative disease of the thoracic spine. IMPRESSION: Unchanged cardiomegaly and elevation of the left hemidiaphragm. Mild pulmonary vascular prominence without overt edema. Electronically Signed   By: Eddie Candle M.D.   On: 07/07/2021 11:16    Procedures Procedures   Medications Ordered in ED Medications  hydrALAZINE (APRESOLINE) injection 10 mg (10 mg Intravenous Given 07/07/21 1246)  furosemide (LASIX) injection 40 mg (40 mg Intravenous Given 07/07/21 1249)    ED Course  I have reviewed the triage vital signs and the nursing notes.  Pertinent labs & imaging results that were available during my care of the patient were reviewed by me and considered in my medical decision making (see chart for details).    MDM Rules/Calculators/A&P                           85 year old female presents to the ED today with complaint of shortness of breath and orthopnea that began yesterday.  History of CHF, has been compliant with her Lasix and  last took around 6 AM this morning as well as her blood pressure medication.  On arrival to the ED patient's blood pressure elevated 201/85.  Remainder of vitals unremarkable.  She is able speak in full sentences without difficulty however has rales throughout.  We will plan for work-up for shortness of breath at this time.  She was medically screened by myself in the triage area and work-up started including a chest x-ray which did show some mild pulmonary vascular congestion without overt edema.  She also has unchanged cardiomegaly.  EKG unchanged compared to previous.  BNP elevated today at 722.5, recent admission last year with BNP in the 200s.  Troponin also elevated today at 62 however history of same.  She denies any chest pain.  She did report in triage that she has a history of MI however I am unable to confirm this in chart review.  During her last mission she had noted the same thing however they were unable to see history of CAD at that time as well.  We will plan to provide IV Lasix at this time and admit to medicine for CHF exacerbation.  We will also plan to repeat troponin, will provide hydralazine for blood pressure control and reevaluate.   Pt reports improvement with IV lasix and is urinating currently at bedside. She is able to lay flat now which she has not been able to do at home. Blood pressure still elevated however.  Repeat troponin 68 Will call for admission at this time  Discussed case with Internal Medicine who will come evaluate patient for admission  This note was prepared using Dragon voice recognition software and may include unintentional dictation errors due to the inherent limitations of voice recognition software.   Final Clinical Impression(s) / ED Diagnoses Final diagnoses:  Acute on chronic congestive heart failure, unspecified heart failure type [facility])  Hypertensive urgency    Rx / DC Orders ED Discharge Orders     None           Eustaquio Maize,  PA-C 07/07/21 1432    Malvin Johns, MD 07/07/21 1451

## 2021-07-07 NOTE — H&P (Addendum)
Date: 07/07/2021               Patient Name:  Jillian [name] MRN: [id number]  DOB: 08-14-1936 Age / Sex: 85 y.o., female   PCP: [name], MD         Medical Service: Internal Medicine Teaching Service         Attending Physician: Dr. Lucious Groves, DO    First Contact: Dr. Humphrey Rolls Pager: [phone]  Second Contact: Dr. Temple Pacini Pager: [phone]       After Hours (After 5p/  First Contact Pager: [phone]  weekends / holidays): Second Contact Pager: [phone]   Chief Complaint: Shortness of breath  History of Present Illness: Patient is 85 year old female with past medical history of HFpEF with EF of 55-60%, HTN, CKD IV, hx of stroke (2002, 2008, 2009, 2016) and MI (2008, 2009) presenting to the ED with SOB and orthopnea. History is obtained from the patient and patient appears to be good historian. Patient was well around 230am this morning and when she went to sleep she felt shortness of breath. After that she could not find comfort. This brought on shortness of breath episode that did not resolve. Patient took all the medicines in the morning but did not find any relief. She called her daughter who came and checked her blood pressure with systolic in [id number]. She didn't have any other symptoms like chest pain, headache, vision changes, or confusion. She endorsed a weight of 179 on 08/21 and in the ED it was 183. She came to the ED via EMS.    ED course: Given IV 40 mg, Hydralazine 10 mg, and weaned to room air. Meds: Taking only 40 mg instead of '60mg'$  Current Meds  Medication Sig   apixaban (ELIQUIS) 2.5 MG TABS tablet Take 1 tablet (2.5 mg total) by mouth 2 (two) times daily.   carvedilol (COREG) 25 MG tablet Take 1 tablet (25 mg total) by mouth 2 (two) times daily.   desoximetasone (TOPICORT) 0.25 % cream Apply 1 application topically 2 (two) times daily. (Patient taking differently: Apply 1 application topically 2 (two) times daily as needed (rash).)   Ferrous Sulfate (IRON) 325  (65 Fe) MG TABS Take 1 tablet by mouth daily.   fluticasone (FLONASE) 50 MCG/ACT nasal spray Place 2 sprays into both nostrils daily.   furosemide (LASIX) 40 MG tablet Take 1 tablet (40 mg total) by mouth daily. (Patient taking differently: Take 60 mg by mouth daily.)   rosuvastatin (CRESTOR) 20 MG tablet Take 1 tablet (20 mg total) by mouth daily. To lower cholesterol   telmisartan (MICARDIS) 40 MG tablet Take 40 mg by mouth daily.     Allergies: Allergies as of 07/07/2021 - Review Complete 07/07/2021  Allergen Reaction Noted   Penicillins Nausea Only 07/09/2020   Aspirin Other (See Comments) 05/27/2015   Atorvastatin Other (See Comments) 05/27/2015   Diltiazem hcl Nausea And Vomiting 05/27/2015   Past Medical History:  Diagnosis Date   Acute CVA (cerebrovascular accident) ([place]) 07/12/2020   Hypertension    Renal disorder    CKD Stage IV   Stroke [facility])     Family History: Mother and Father died of stroke, sister alzheimer  Family History  Problem Relation Age of Onset   CAD Mother    Breast cancer Mother    Social History: Lives by her self. No alcohol, tobacco, or substance use. Social History   Socioeconomic History   Marital status: Unknown  Spouse name: Not on file   Number of children: Not on file   Years of education: Not on file   Highest education level: Not on file  Occupational History   Not on file  Tobacco Use   Smoking status: Never   Smokeless tobacco: Never  Substance and Sexual Activity   Alcohol use: Not on file   Drug use: Not on file   Sexual activity: Not on file  Other Topics Concern   Not on file  Social History Narrative   Not on file   Social Determinants of Health   Financial Resource Strain: Not on file  Food Insecurity: Not on file  Transportation Needs: Not on file  Physical Activity: Not on file  Stress: Not on file  Social Connections: Not on file  Intimate Partner Violence: Not on file   Review of Systems: Review of  Systems  Constitutional:  Negative for chills and fever.  HENT:  Negative for hearing loss and tinnitus.   Eyes:  Negative for blurred vision and double vision.  Respiratory:  Positive for shortness of breath. Negative for cough and hemoptysis.   Cardiovascular:  Positive for orthopnea. Negative for chest pain and palpitations.  Gastrointestinal:  Positive for nausea. Negative for heartburn and vomiting.  Genitourinary:  Negative for dysuria and urgency.  Musculoskeletal:  Negative for myalgias and neck pain.  Skin:  Negative for itching and rash.  Neurological:  Positive for headaches. Negative for dizziness, tingling and weakness.  Endo/Heme/Allergies:  Negative for environmental allergies. Does not bruise/bleed easily.  Psychiatric/Behavioral:  Negative for depression and suicidal ideas.    Physical Exam: Blood pressure (!) 191/78, pulse 68, temperature 98.2 F (36.8 C), temperature source Oral, resp. rate (!) 25, height '5\' 1"'$  (1.549 m), weight 82.7 kg, SpO2 97 %. Physical Exam Constitutional:      General: She is not in acute distress.    Appearance: Normal appearance.  HENT:     Head: Normocephalic and atraumatic.     Right Ear: External ear normal.     Left Ear: External ear normal.     Nose: Nose normal. No rhinorrhea.     Mouth/Throat:     Mouth: Mucous membranes are moist.     Pharynx: Oropharynx is clear. No posterior oropharyngeal erythema.  Eyes:     Extraocular Movements: Extraocular movements intact.     Pupils: Pupils are equal, round, and reactive to light.  Cardiovascular:     Rate and Rhythm: Normal rate and regular rhythm.     Pulses: Normal pulses.     Heart sounds: Normal heart sounds.  Pulmonary:     Effort: Pulmonary effort is normal.     Breath sounds: Rales (bibasilar rales) present.  Abdominal:     General: Bowel sounds are normal. There is no distension.     Palpations: Abdomen is soft.     Tenderness: There is no abdominal tenderness.   Musculoskeletal:        General: No swelling or tenderness. Normal range of motion.     Cervical back: Normal range of motion and neck supple.  Skin:    General: Skin is warm and dry.     Capillary Refill: Capillary refill takes less than 2 seconds.     Findings: No erythema.  Neurological:     General: No focal deficit present.     Mental Status: She is alert.     Cranial Nerves: No cranial nerve deficit.     Motor: No  weakness.  Psychiatric:        Mood and Affect: Mood normal.        Behavior: Behavior normal.     EKG: personally reviewed my interpretation is normal sinus rhythm with left ventricle hypertrophy.   CXR: personally reviewed my interpretation is no active cardiopulmonary disease with chronic eventration of the left hemidiaphragm.   Assessment & Plan by Problem: Shortness of Breath Patient presented with shortness of breath and orthopnea. She tried her home lasix but the shortness of breath continued. Ddx. CHF exacerbation vs flash pulmonary edema vs PE vs MI. Patient has history of CHF with preserved ejection fraction. She denied any inciting events but stated the episode happened after laying down. Physical exam shows bibasilar rales along with lab showing BNP elevation. She states it feels just like last year where she had CHF exacerbation. Flash pulmonary edema is likely as well as patient was found hypertensive at the time of this episode which can cause flash pulmonary edema. Significant elevation in blood pressure makes this likely but negative CXR points against this diagnosis. PE and MI are most concerning but least likely due to no chest pain, no DVT, and on chronic AC therapy. MI is unlikely due to no CP, and normal EKG. The troponin was elevated but patient has history of elevated troponin.   Plan: - IV lasix 40 mg - Trend BMP - Mag level - Strict I&Os - Daily Weights - Replenish K as needed >4.0 - Continue home meds: lasix 60 mg once pt is  euvolemic  Hypertension Patient has history of hypertension well controlled with telmisartan 40 mg at home. Patient states it usually runs around 130's/70's. Patient blood pressure in the ED was 221/87. IV hydralazine 10 mg given in the ED.   Plan: -Continue diuresis -IV labetalol as need for BP -Continue home meds; telmisartan 40 mg qd, coreg 25 BID  Chronic Kidney Disease IV She has history of having elevated creatinine with diagnosis of CKD IV.  Plan: -IV lasix to improve CO -Continue home meds: lasix 60 mg once pt is euvolemic, telmisartan 40 mg  Hx of Stroke Patient has history of multiple strokes with imaging suggesting embolic nature. She is on chronic eliquis therapy.  Plan: -Continue 2.5 mg Eliquis  -Crestor 20 mg   Normocytic Anemia: Patient has normocytic anemia with hemoglobin of 10.5 on iron supplementation with iron 325 mg.  Plan: -Continue home med iron 325 mg  Prediabetes: Patient has history of elevated glucose. Last A1c 6.3 on 06/2020.  Plan: -Repeat A1c  DVT prophx:Eliquis Diet: Cardio Bowel: prn Code: Full  Prior to Admission Living Arrangement: Home  Anticipated Discharge Location: Home Barriers to Discharge: CHF exacerbation  Dispo: Admit patient to Observation with expected length of stay less than 2 midnights.  Signed: Idamae Schuller, MD 07/07/2021, 6:33 PM  Pager: [phone]

## 2021-07-07 NOTE — ED Notes (Signed)
Pt in room laying in bed c/o pain in butt area. Informed pt that they had a bed upstairs and will be switching beds again. Pt receptive to information.

## 2021-07-07 NOTE — ED Triage Notes (Signed)
Pt here from home with c/o sob for the last 24 hrs , pt has hx of chf , no cp , also hc of cva

## 2021-07-07 NOTE — ED Notes (Signed)
Patient c/o shortness of breath that started at 200. States she is not feeling short of breath now

## 2021-07-07 NOTE — ED Notes (Signed)
Jillian [name] daughter [phone] would like an update on the patient

## 2021-07-08 ENCOUNTER — Other Ambulatory Visit (HOSPITAL_COMMUNITY): Payer: Self-pay

## 2021-07-08 ENCOUNTER — Observation Stay (HOSPITAL_BASED_OUTPATIENT_CLINIC_OR_DEPARTMENT_OTHER): Payer: Medicare Other

## 2021-07-08 DIAGNOSIS — R7303 Prediabetes: Secondary | ICD-10-CM

## 2021-07-08 DIAGNOSIS — I5033 Acute on chronic diastolic (congestive) heart failure: Secondary | ICD-10-CM

## 2021-07-08 DIAGNOSIS — N184 Chronic kidney disease, stage 4 (severe): Secondary | ICD-10-CM | POA: Diagnosis not present

## 2021-07-08 DIAGNOSIS — I10 Essential (primary) hypertension: Secondary | ICD-10-CM

## 2021-07-08 LAB — CBC
HCT: 33.3 % — ABNORMAL LOW (ref 36.0–46.0)
Hemoglobin: 10.7 g/dL — ABNORMAL LOW (ref 12.0–15.0)
MCH: 27.6 pg (ref 26.0–34.0)
MCHC: 32.1 g/dL (ref 30.0–36.0)
MCV: 86 fL (ref 80.0–100.0)
Platelets: 196 10*3/uL (ref 150–400)
RBC: 3.87 MIL/uL (ref 3.87–5.11)
RDW: 14.1 % (ref 11.5–15.5)
WBC: 6.4 10*3/uL (ref 4.0–10.5)
nRBC: 0 % (ref 0.0–0.2)

## 2021-07-08 LAB — BASIC METABOLIC PANEL
Anion gap: 8 (ref 5–15)
BUN: 21 mg/dL (ref 8–23)
CO2: 28 mmol/L (ref 22–32)
Calcium: 8.7 mg/dL — ABNORMAL LOW (ref 8.9–10.3)
Chloride: 103 mmol/L (ref 98–111)
Creatinine, Ser: 2.08 mg/dL — ABNORMAL HIGH (ref 0.44–1.00)
GFR, Estimated: 23 mL/min — ABNORMAL LOW (ref >60)
Glucose, Bld: 124 mg/dL — ABNORMAL HIGH (ref 70–99)
Potassium: 3.2 mmol/L — ABNORMAL LOW (ref 3.5–5.1)
Sodium: 139 mmol/L (ref 135–145)

## 2021-07-08 LAB — ECHOCARDIOGRAM COMPLETE
Area-P 1/2: 4.49 cm2
Height: 61 in
S' Lateral: 2.7 cm
Weight: 2854.4 oz

## 2021-07-08 LAB — HEMOGLOBIN A1C
Hgb A1c MFr Bld: 6.2 % — ABNORMAL HIGH (ref 4.8–5.6)
Mean Plasma Glucose: 131.24 mg/dL

## 2021-07-08 MED ORDER — POLYETHYLENE GLYCOL 3350 17 G PO PACK
17.0000 g | PACK | Freq: Every day | ORAL | Status: DC
  Administered 2021-07-08: 17 g via ORAL
  Filled 2021-07-08: qty 1

## 2021-07-08 MED ORDER — POTASSIUM CHLORIDE CRYS ER 20 MEQ PO TBCR
20.0000 meq | EXTENDED_RELEASE_TABLET | Freq: Two times a day (BID) | ORAL | Status: DC
  Administered 2021-07-08: 20 meq via ORAL
  Filled 2021-07-08: qty 1

## 2021-07-08 MED ORDER — POTASSIUM CHLORIDE CRYS ER 20 MEQ PO TBCR: EXTENDED_RELEASE_TABLET | ORAL | 6 refills | Status: DC

## 2021-07-08 MED ORDER — POLYETHYLENE GLYCOL 3350 17 GM/SCOOP PO POWD
17.0000 g | Freq: Every day | ORAL | 0 refills | Status: AC
  Filled 2021-07-08: qty 510, 30d supply, fill #0

## 2021-07-08 MED ORDER — FUROSEMIDE 20 MG PO TABS
60.0000 mg | ORAL_TABLET | Freq: Every day | ORAL | 0 refills | Status: DC
  Filled 2021-07-08: qty 90, 30d supply, fill #0

## 2021-07-08 NOTE — Progress Notes (Signed)
  Echocardiogram 2D Echocardiogram has been performed.  Jillian [name] 07/08/2021, 10:49 AM

## 2021-07-08 NOTE — Progress Notes (Signed)
Heart Failure Navigator Progress Note  Assessed for Heart & Vascular TOC clinic readiness.  Patient does not meet criteria due to AKI on advanced CKD IV, SCr 2.08. Pt educated on medication regimen as noted pt taking incorrect dose of lasix.   Navigator available for reassessment of patient.   Pricilla Holm, MSN, RN Heart Failure Nurse Navigator [phone]

## 2021-07-08 NOTE — Progress Notes (Signed)
Pt's BP 184/89 with a MAP of 116 @ arrival to unit. Lorin Glass, MD made aware; see new orders. Will continue to monitor.  Elaina Hoops, RN

## 2021-07-08 NOTE — Discharge Summary (Addendum)
Name: Jillian [name] MRN: [id number] DOB: Nov 09, 1936 85 y.o. PCP: Jillian Stain, MD  Date of Admission: 07/07/2021 10:14 AM Date of Discharge: 8/24/202208/24/2022 Attending Physician: Lucious Groves   Discharge Diagnosis: 1. Acute on chronic HFpEF exacerbation 2. Hypertension 3. Hx of CAD  4. CKD IV 5. Prediabetes  6. Hx of CVA    Discharge Medications: Allergies as of 07/08/2021       Reactions   Penicillins Nausea Only   Aspirin Other (See Comments)   Atorvastatin Other (See Comments)   fatigue   Diltiazem Hcl Nausea And Vomiting        Medication List     TAKE these medications    apixaban 2.5 MG Tabs tablet Commonly known as: ELIQUIS Take 1 tablet (2.5 mg total) by mouth 2 (two) times daily.   carvedilol 25 MG tablet Commonly known as: COREG Take 1 tablet (25 mg total) by mouth 2 (two) times daily.   desoximetasone 0.25 % cream Commonly known as: Topicort Apply 1 application topically 2 (two) times daily. What changed:  when to take this reasons to take this   fluticasone 50 MCG/ACT nasal spray Commonly known as: FLONASE Place 2 sprays into both nostrils daily.   furosemide 20 MG tablet Commonly known as: LASIX Take 3 tablets (60 mg total) by mouth daily. Start taking on: July 09, 2021 What changed:  medication strength how much to take   Iron 325 (65 Fe) MG Tabs Take 1 tablet by mouth daily.   polyethylene glycol powder 17 GM/SCOOP powder Commonly known as: GLYCOLAX/MIRALAX Take 17 g by mouth daily. Start taking on: July 09, 2021   potassium chloride SA 20 MEQ tablet Commonly known as: KLOR-CON Take 1 tablet daily until PCP follow up and then follow PCP recommendation regarding taking potassium. What changed: additional instructions   rosuvastatin 20 MG tablet Commonly known as: CRESTOR Take 1 tablet (20 mg total) by mouth daily. To lower cholesterol   telmisartan 40 MG tablet Commonly known as: MICARDIS Take 40 mg by mouth  daily.        Disposition and follow-up:   [name] was discharged from [facility] in Stable condition.  At the hospital follow up visit please address:  1.  Acute HFpEF exacerbation: Patient discharged on Lasix '60mg'$  daily and potassium supplementation 4mq daily. Continued on coreg '25mg'$  bid. On follow up, please ensure compliance with current regimen. Follow up BMP   Hypertension: Continue telmisartan '40mg'$  daily.   Hx of CAD: Continue rosuvastatin '20mg'$  daily   CKD IV: Follow up with nephrology. Avoid nephrotoxic agents.   Prediabetes: HbA1c 6.2. Continue to encourage lifestyle modifications  Hx of CVA: Concern for possible embolic CVA in the past. Continue Eliquis 2.'5mg'$  bid   2.  Labs / imaging needed at time of follow-up: BMP  3.  Pending labs/ test needing follow-up: None  Follow-up Appointments:  Follow-up Information     WElsie Stain MD. Call today.   Specialty: Pulmonary Disease Why: Please make a hospital follow up with your PCP, Dr. WJoya Gaskins Contact information: [phone]         RFay Records MD .   Specialty: Cardiology Contact information: [address] [place] [id number]: [facility]-Patient presented to the ED with SOB and orthopnea.Patient was well around 230am this  morning and when she went to sleep she felt shortness of breath. After that she could not find comfort. This brought on shortness of breath episode that did not resolve. Patient took all the medicines in the morning but did not find any relief. She called her daughter who came and checked her blood pressure with systolic in [id number]. She didn't have any other symptoms like chest pain, headache, vision changes, or confusion. She endorsed a weight of 179 on 08/21 and in the ED it was 183. She was given 40 mg IV lasix and IV hydralazine 10 mg. She  was admitted for management to the inpatient team.  07/08/21- No acute events overnight. Patient denies any concerns. Patient is normotensive and breathing well on room air. Echo repeated showing findings outlined below. Records obtained from Dr. Joylene Grapes office showing she should be on 60 mg Lasix. Patient deemed stable for discharge. Instructions explained and and patient endorsed understanding. Patient was discharged.    Discharge Subjective:  Patient doing well. Denied any acute concerns. No shortness of breath or chest pain. Complained of constipation causing straining so recommended Miralax.  Discharge Exam:   BP 129/60 (BP Location: Right Arm)   Pulse (!) 54   Temp 98.8 F (37.1 C) (Oral)   Resp 16   Ht '5\' 1"'$  (1.549 m)   Wt 80.9 kg   SpO2 99%   BMI 33.71 kg/m  Discharge exam:  Physical Exam Constitutional:      General: She is not in acute distress.    Appearance: Normal appearance. She is not ill-appearing.  HENT:     Head: Normocephalic and atraumatic.     Right Ear: External ear normal.     Left Ear: External ear normal.     Nose: Nose normal.     Mouth/Throat:     Mouth: Mucous membranes are moist.     Pharynx: Oropharynx is clear.  Eyes:     Extraocular Movements: Extraocular movements intact.     Conjunctiva/sclera: Conjunctivae normal.  Cardiovascular:     Rate and Rhythm: Normal rate and regular rhythm.     Pulses: Normal pulses.     Heart sounds: Normal heart sounds. No murmur heard.   No friction rub. No gallop.  Pulmonary:     Effort: Pulmonary effort is normal. No respiratory distress.     Breath sounds: Normal breath sounds. No stridor. No wheezing or rhonchi.  Abdominal:     General: Bowel sounds are normal. There is no distension.     Palpations: Abdomen is soft. There is no mass.  Musculoskeletal:        General: No swelling or tenderness. Normal range of motion.     Cervical back: Normal range of motion and neck supple.  Skin:    Capillary Refill:  Capillary refill takes less than 2 seconds.     Coloration: Skin is not jaundiced.     Findings: No erythema, lesion or rash.  Neurological:     General: No focal deficit present.     Mental Status: She is alert and oriented to person, place, and time. Mental status is at baseline.  Psychiatric:        Mood and Affect: Mood normal.        Behavior: Behavior normal.    Pertinent Labs, Studies, and Procedures:  CBC Latest Ref Rng & Units 07/08/2021 07/07/2021 03/20/2021  WBC 4.0 - 10.5 K/uL 6.4 8.6 6.1  Hemoglobin 12.0 - 15.0 g/dL 10.7(L) 10.5(L) 10.0(L)  Hematocrit  36.0 - 46.0 % 33.3(L) 35.1(L) 32.0(L)  Platelets 150 - 400 K/uL 196 209 223    CMP Latest Ref Rng & Units 07/08/2021 07/07/2021 05/05/2021  Glucose 70 - 99 mg/dL 124(H) 128(H) 103(H)  BUN 8 - 23 mg/dL '21 21 23  '$ Creatinine 0.44 - 1.00 mg/dL 2.08(H) 1.97(H) 1.64(H)  Sodium 135 - 145 mmol/L 139 140 142  Potassium 3.5 - 5.1 mmol/L 3.2(L) 3.3(L) 3.3(L)  Chloride 98 - 111 mmol/L 103 104 99  CO2 22 - 32 mmol/L '28 27 24  '$ Calcium 8.9 - 10.3 mg/dL 8.7(L) 8.9 9.1  Total Protein 6.5 - 8.1 g/dL - 7.9 -  Total Bilirubin 0.3 - 1.2 mg/dL - 1.1 -  Alkaline Phos 38 - 126 U/L - 167(H) -  AST 15 - 41 U/L - 26 -  ALT 0 - 44 U/L - 25 -    DG Chest 2 View  Result Date: 07/07/2021 CLINICAL DATA:  Shortness of breath history of CHF EXAM: CHEST - 2 VIEW COMPARISON:  09/09/2020 FINDINGS: Rotated examination. Unchanged cardiomegaly and elevation of the left hemidiaphragm. Mild pulmonary vascular prominence without overt edema. Disc degenerative disease of the thoracic spine. IMPRESSION: Unchanged cardiomegaly and elevation of the left hemidiaphragm. Mild pulmonary vascular prominence without overt edema. Electronically Signed   By: Eddie Candle M.D.   On: 07/07/2021 11:16   ECHOCARDIOGRAM COMPLETE  Result Date: 07/08/2021    ECHOCARDIOGRAM REPORT   Patient Name:   [facility] Date of Exam: 07/08/2021 Medical Rec #:  [id number]     Height:       61.0 in  Accession #:    [id number]    Weight:       178.4 lb Date of Birth:  1935/11/16     BSA:          1.799 m Patient Age:    32 years      BP:           129/60 mmHg Patient Gender: F             HR:           55 bpm. Exam Location:  Inpatient Procedure: 2D Echo, Cardiac Doppler and Color Doppler Indications:    I50.33 Acute on chronic diastolic (congestive) heart failure  History:        Patient has prior history of Echocardiogram examinations, most                 recent 07/09/2020. Stroke; Risk Factors:Hypertension. Renal                 Disorder.  Sonographer:    Tiffany Dance RVT Referring Phys: [place]  1. Left ventricular ejection fraction, by estimation, is 60 to 65%. The left ventricle has normal function. The left ventricle has no regional wall motion abnormalities. Left ventricular diastolic parameters are consistent with Grade III diastolic dysfunction (restrictive). Elevated left ventricular end-diastolic pressure.  2. Right ventricular systolic function is normal. The right ventricular size is normal.  3. Left atrial size was severely dilated.  4. The mitral valve is grossly normal. Mild mitral valve regurgitation.  5. The aortic valve is tricuspid. There is mild calcification of the aortic valve. There is mild thickening of the aortic valve. Aortic valve regurgitation is not visualized.   Discharge Instructions: Discharge Instructions     Call MD for:   Complete by: As directed    Come to the ED for shortness of breath  Call MD for:  difficulty breathing, headache or visual disturbances   Complete by: As directed    Call MD for:  extreme fatigue   Complete by: As directed    Call MD for:  hives   Complete by: As directed    Call MD for:  persistant dizziness or light-headedness   Complete by: As directed    Call MD for:  persistant nausea and vomiting   Complete by: As directed    Call MD for:  redness, tenderness, or signs of infection (pain, swelling, redness,  odor or green/yellow discharge around incision site)   Complete by: As directed    Call MD for:  severe uncontrolled pain   Complete by: As directed    Call MD for:  temperature >100.4   Complete by: As directed    Discharge instructions   Complete by: As directed    You came to the ED for shortness of breath. You complained of having too much fluid and lab work showed that. You had elevated blood pressure at the time of presentation. You were given IV lasix and responded well to that. You stated you take 40 mg of lasix instead of 60 mg lasix. I saw notes from Dr. Joylene Grapes and it stated you were taking 60 mg. Continue taking 60 mg lasix and make sure you stay adequately hydrated as low water intake and lasix will cause you to have kidney injury. Recommendation is around 64 oz and no more fluids than that per day. Take the Miralax everyday as needed as straining for bowel movement is not good. Follow with your PCP in 7-10 days.   Increase activity slowly   Complete by: As directed        Signed: Harvie Heck, MD 07/08/2021, 5:12 PM   Pager: [phone]

## 2021-07-09 ENCOUNTER — Telehealth: Payer: Self-pay

## 2021-07-09 NOTE — Telephone Encounter (Signed)
Call placed to patient's daughter, Jillian [name], regarding her mother's potassium chloride.  Informed her her Dr Joya Gaskins: Potassium was low, stay on potassium as prescribed. [name] verbalized understanding.

## 2021-07-09 NOTE — Telephone Encounter (Signed)
Transition Care Management Follow-up Telephone Call  Call completed with patient's daughter, Jillian [name].  She was not with the patient at the time of this call.  Date of discharge and from where: 07/08/2021, [facility]  How have you been since you were released from the hospital? Jillian [name] said her mother is doing much better.  Any questions or concerns? Yes - she would like her mother to have a home attendant but her mother has refused help. Her mother does not have Medicaid for PCS. Jillian [name] said she will check to see if her mother would qualify for Medicaid.  Items Reviewed: Did the pt receive and understand the discharge instructions provided? Yes  Medications obtained and verified? Yes  - Jillian [name] said she has all medications and will review the medications as well as the discharge instructions when she sees her mother this afternoon.  Instructed her to call this CM if she has any questions.  She said she is aware that her mother should be taking lasix 60 mg daily.  Other? No  Any new allergies since your discharge? No  Do you have support at home?  She lives alone but her daughters check on.   Home Care and Equipment/Supplies: Were home health services ordered? no If so, what is the name of the agency? N/a  Has the agency set up a time to come to the patient's home? not applicable Were any new equipment or medical supplies ordered?  No What is the name of the medical supply agency? N/a Were you able to get the supplies/equipment? not applicable Do you have any questions related to the use of the equipment or supplies? No  Functional Questionnaire: (I = Independent and D = Dependent) ADLs: independent. Family provides support as needed   Follow up appointments reviewed:  PCP Hospital f/u appt confirmed? Yes  Scheduled to see Dr Joya Gaskins  on 08/05/2021. Jillian [name] did not want to schedule anything sooner.  [name] f/u appt confirmed? Yes  Scheduled to see cardiology on  09/14/2021. Are transportation arrangements needed? Yes  - instructed her daughter to contact patient's insurance company as they will usually provide for transportation to medical appointments. If that does not work, [employer] can be arranged.  If their condition worsens, is the pt aware to call PCP or go to the Emergency Dept.? Yes Was the patient provided with contact information for the PCP's office or ED? Yes Was to pt encouraged to call back with questions or concerns? Yes

## 2021-07-09 NOTE — Telephone Encounter (Signed)
Potassium was low, stay on potassium as prescribed

## 2021-07-31 ENCOUNTER — Ambulatory Visit (HOSPITAL_BASED_OUTPATIENT_CLINIC_OR_DEPARTMENT_OTHER): Payer: Medicare Other

## 2021-07-31 DIAGNOSIS — Z5329 Procedure and treatment not carried out because of patient's decision for other reasons: Secondary | ICD-10-CM

## 2021-07-31 DIAGNOSIS — Z91199 Patient's noncompliance with other medical treatment and regimen due to unspecified reason: Secondary | ICD-10-CM

## 2021-07-31 NOTE — Patient Instructions (Signed)
Health Maintenance, Female Adopting a healthy lifestyle and getting preventive care are important in promoting health and wellness. Ask your health care provider about: The right schedule for you to have regular tests and exams. Things you can do on your own to prevent diseases and keep yourself healthy. What should I know about diet, weight, and exercise? Eat a healthy diet  Eat a diet that includes plenty of vegetables, fruits, low-fat dairy products, and lean protein. Do not eat a lot of foods that are high in solid fats, added sugars, or sodium. Maintain a healthy weight Body mass index (BMI) is used to identify weight problems. It estimates body fat based on height and weight. Your health care provider can help determine your BMI and help you achieve or maintain a healthy weight. Get regular exercise Get regular exercise. This is one of the most important things you can do for your health. Most adults should: Exercise for at least 150 minutes each week. The exercise should increase your heart rate and make you sweat (moderate-intensity exercise). Do strengthening exercises at least twice a week. This is in addition to the moderate-intensity exercise. Spend less time sitting. Even light physical activity can be beneficial. Watch cholesterol and blood lipids Have your blood tested for lipids and cholesterol at 83 years of age, then have this test every 5 years. Have your cholesterol levels checked more often if: Your lipid or cholesterol levels are high. You are older than 83 years of age. You are at high risk for heart disease. What should I know about cancer screening? Depending on your health history and family history, you may need to have cancer screening at various ages. This may include screening for: Breast cancer. Cervical cancer. Colorectal cancer. Skin cancer. Lung cancer. What should I know about heart disease, diabetes, and high blood pressure? Blood pressure and heart  disease High blood pressure causes heart disease and increases the risk of stroke. This is more likely to develop in people who have high blood pressure readings, are of African descent, or are overweight. Have your blood pressure checked: Every 3-5 years if you are 18-39 years of age. Every year if you are 40 years old or older. Diabetes Have regular diabetes screenings. This checks your fasting blood sugar level. Have the screening done: Once every three years after age 40 if you are at a normal weight and have a low risk for diabetes. More often and at a younger age if you are overweight or have a high risk for diabetes. What should I know about preventing infection? Hepatitis B If you have a higher risk for hepatitis B, you should be screened for this virus. Talk with your health care provider to find out if you are at risk for hepatitis B infection. Hepatitis C Testing is recommended for: Everyone born from 1945 through 1965. Anyone with known risk factors for hepatitis C. Sexually transmitted infections (STIs) Get screened for STIs, including gonorrhea and chlamydia, if: You are sexually active and are younger than 83 years of age. You are older than 83 years of age and your health care provider tells you that you are at risk for this type of infection. Your sexual activity has changed since you were last screened, and you are at increased risk for chlamydia or gonorrhea. Ask your health care provider if you are at risk. Ask your health care provider about whether you are at high risk for HIV. Your health care provider may recommend a prescription medicine   to help prevent HIV infection. If you choose to take medicine to prevent HIV, you should first get tested for HIV. You should then be tested every 3 months for as long as you are taking the medicine. Pregnancy If you are about to stop having your period (premenopausal) and you may become pregnant, seek counseling before you get  pregnant. Take 400 to 800 micrograms (mcg) of folic acid every day if you become pregnant. Ask for birth control (contraception) if you want to prevent pregnancy. Osteoporosis and menopause Osteoporosis is a disease in which the bones lose minerals and strength with aging. This can result in bone fractures. If you are 65 years old or older, or if you are at risk for osteoporosis and fractures, ask your health care provider if you should: Be screened for bone loss. Take a calcium or vitamin D supplement to lower your risk of fractures. Be given hormone replacement therapy (HRT) to treat symptoms of menopause. Follow these instructions at home: Lifestyle Do not use any products that contain nicotine or tobacco, such as cigarettes, e-cigarettes, and chewing tobacco. If you need help quitting, ask your health care provider. Do not use street drugs. Do not share needles. Ask your health care provider for help if you need support or information about quitting drugs. Alcohol use Do not drink alcohol if: Your health care provider tells you not to drink. You are pregnant, may be pregnant, or are planning to become pregnant. If you drink alcohol: Limit how much you use to 0-1 drink a day. Limit intake if you are breastfeeding. Be aware of how much alcohol is in your drink. In the [place] one drink equals one 12 oz bottle of beer (355 mL), one 5 oz glass of wine (148 mL), or one 1 oz glass of hard liquor (44 mL). General instructions Schedule regular health, dental, and eye exams. Stay current with your vaccines. Tell your health care provider if: You often feel depressed. You have ever been abused or do not feel safe at home. Summary Adopting a healthy lifestyle and getting preventive care are important in promoting health and wellness. Follow your health care provider's instructions about healthy diet, exercising, and getting tested or screened for diseases. Follow your health care provider's  instructions on monitoring your cholesterol and blood pressure. This information is not intended to replace advice given to you by your health care provider. Make sure you discuss any questions you have with your health care provider. Document Revised: 01/09/2021 Document Reviewed: 10/25/2018 Elsevier Patient Education  2022 [employer]  

## 2021-08-05 ENCOUNTER — Ambulatory Visit: Payer: Medicare Other | Admitting: Critical Care Medicine

## 2021-09-14 ENCOUNTER — Ambulatory Visit: Payer: Medicare Other | Admitting: Internal Medicine

## 2021-09-30 ENCOUNTER — Other Ambulatory Visit: Payer: Self-pay

## 2021-09-30 DIAGNOSIS — E782 Mixed hyperlipidemia: Secondary | ICD-10-CM

## 2021-09-30 MED ORDER — ROSUVASTATIN CALCIUM 20 MG PO TABS: 20.0000 mg | ORAL_TABLET | Freq: Every day | ORAL | 0 refills | Status: DC

## 2021-10-01 ENCOUNTER — Other Ambulatory Visit: Payer: Self-pay

## 2021-10-01 ENCOUNTER — Encounter: Payer: Self-pay | Admitting: Critical Care Medicine

## 2021-10-01 ENCOUNTER — Ambulatory Visit: Payer: Medicare Other | Attending: Critical Care Medicine | Admitting: Critical Care Medicine

## 2021-10-01 DIAGNOSIS — N184 Chronic kidney disease, stage 4 (severe): Secondary | ICD-10-CM

## 2021-10-01 DIAGNOSIS — Z8673 Personal history of transient ischemic attack (TIA), and cerebral infarction without residual deficits: Secondary | ICD-10-CM

## 2021-10-01 DIAGNOSIS — I129 Hypertensive chronic kidney disease with stage 1 through stage 4 chronic kidney disease, or unspecified chronic kidney disease: Secondary | ICD-10-CM | POA: Diagnosis not present

## 2021-10-01 DIAGNOSIS — E782 Mixed hyperlipidemia: Secondary | ICD-10-CM | POA: Diagnosis not present

## 2021-10-01 DIAGNOSIS — D631 Anemia in chronic kidney disease: Secondary | ICD-10-CM | POA: Diagnosis not present

## 2021-10-01 DIAGNOSIS — I5032 Chronic diastolic (congestive) heart failure: Secondary | ICD-10-CM | POA: Diagnosis not present

## 2021-10-01 MED ORDER — SENNA-DOCUSATE SODIUM 8.6-50 MG PO TABS: 3.0000 | ORAL_TABLET | Freq: Every day | ORAL | 4 refills | Status: DC

## 2021-10-01 NOTE — Progress Notes (Signed)
Established Patient Office Visit  Subjective:  Patient ID: Jillian [name], female    DOB: [birth year]  Age: 85 y.o. MRN: [id number] Virtual Visit via Telephone Note  I connected with Jillian [name] on 10/01/21 at 10:00 AM EST by telephone and verified that I am speaking with the correct person using two identifiers.   Consent:  I discussed the limitations, risks, security and privacy concerns of performing an evaluation and management service by telephone and the availability of in person appointments. I also discussed with the patient that there may be a patient responsible charge related to this service. The patient expressed understanding and agreed to proceed.  Location of patient: Patient's at home  Location of provider: I am in my office  Persons participating in the televisit with the patient.   Normal call    History of Present Illness:   CC: Telephone visit for post hospital care   HPI Jillian [name] presents for post hospital visit which occurred in August of this year.  I have not seen this patient since June of this year.  Somehow we did not get her back in follow-up.  Patient lives at home with family support.  Transportation is a difficulty.  Note she is only having 1-2 bowel movements every 2 or 3 days.  She is out of her furosemide.  Below is a copy of the discharge summary from August   MRN: [id number] DOB: [birth year] 85 y.o. PCP: [name], MD   Date of Admission: 07/07/2021 10:14 AM Date of Discharge: 8/24/202208/24/2022 Attending Physician: Lucious Groves    Discharge Diagnosis: 1. Acute on chronic HFpEF exacerbation 2. Hypertension 3. Hx of CAD  4. CKD IV 5. Prediabetes  6. Hx of CVA    1.  Acute HFpEF exacerbation: Patient discharged on Lasix 71m daily and potassium supplementation 260m daily. Continued on coreg 2536mid. On follow up, please ensure compliance with current regimen. Follow up BMP    Hypertension: Continue telmisartan 53m63maily.    Hx of CAD: Continue rosuvastatin 20mg57mly    CKD IV: Follow up with nephrology. Avoid nephrotoxic agents.    Prediabetes: HbA1c 6.2. Continue to encourage lifestyle modifications   Hx of CVA: Concern for possible embolic CVA in the past. Continue Eliquis 2.5mg b39m   2.  Labs / imaging needed at time of follow-up: BMP   3.  Pending labs/ test needing follow-up: None   It can rarely we get this patient in for laboratory and examination directly Past Medical History:  Diagnosis Date   Acute CVA (cerebrovascular accident) (HCC) [place]/2021   Hypertension    Renal disorder    CKD Stage IV   [facility]:  Procedure Laterality Date   CARDIAC CATHETERIZATION  2009    Family History  Problem Relation Age of Onset   CAD Mother    Breast cancer Mother     Social History   Socioeconomic History   Marital status: Unknown    Spouse name: Not on file   Number of children: Not on file   Years of education: Not on file   Highest education level: Not on file  Occupational History   Not on file  Tobacco Use   Smoking status: Never   Smokeless tobacco: Never  Substance and Sexual Activity   Alcohol use: Not on file   Drug use: Not on file   Sexual activity: Not on file  Other Topics Concern  Not on file  Social History Narrative   Not on file   Social Determinants of Health   Financial Resource Strain: Not on file  Food Insecurity: Not on file  Transportation Needs: Not on file  Physical Activity: Not on file  Stress: Not on file  Social Connections: Not on file  Intimate Partner Violence: Not on file    Outpatient Medications Prior to Visit  Medication Sig Dispense Refill   apixaban (ELIQUIS) 2.5 MG TABS tablet Take 1 tablet (2.5 mg total) by mouth 2 (two) times daily. 60 tablet 6   carvedilol (COREG) 25 MG tablet Take 1 tablet (25 mg total) by mouth 2 (two) times daily. 60 tablet 6   desoximetasone (TOPICORT) 0.25 % cream  Apply 1 application topically 2 (two) times daily. (Patient taking differently: Apply 1 application topically 2 (two) times daily as needed (rash).) 30 g 0   Ferrous Sulfate (IRON) 325 (65 Fe) MG TABS Take 1 tablet by mouth every other day.     rosuvastatin (CRESTOR) 20 MG tablet Take 1 tablet (20 mg total) by mouth daily. To lower cholesterol. Please keep upcoming appt in December 2022 with Cardiologist before anymore refills. Thank you 30 tablet 0   Ferrous Sulfate (IRON) 325 (65 Fe) MG TABS Take 1 tablet by mouth daily.     furosemide (LASIX) 20 MG tablet Take 3 tablets (60 mg total) by mouth daily. (Patient not taking: Reported on 10/01/2021) 90 tablet 0   telmisartan (MICARDIS) 40 MG tablet Take 40 mg by mouth daily. (Patient not taking: Reported on 10/01/2021)     fluticasone (FLONASE) 50 MCG/ACT nasal spray Place 2 sprays into both nostrils daily. (Patient not taking: Reported on 10/01/2021) 16 g 6   potassium chloride SA (KLOR-CON) 20 MEQ tablet Take 1 tablet daily until PCP follow up and then follow PCP recommendation regarding taking potassium. (Patient not taking: Reported on 10/01/2021) 30 tablet 6   No facility-administered medications prior to visit.    Allergies  Allergen Reactions   Penicillins Nausea Only   Aspirin Other (See Comments)   Atorvastatin Other (See Comments)    fatigue   Diltiazem Hcl Nausea And Vomiting    ROS Review of Systems  Constitutional:  Negative for chills, diaphoresis and fever.  HENT:  Negative for congestion, hearing loss, nosebleeds, sore throat and tinnitus.   Eyes:  Negative for photophobia and redness.  Respiratory:  Negative for cough, shortness of breath, wheezing and stridor.   Cardiovascular:  Negative for chest pain, palpitations and leg swelling.  Gastrointestinal:  Positive for constipation. Negative for abdominal pain, blood in stool, diarrhea, nausea and vomiting.  Endocrine: Negative for polydipsia.  Genitourinary:  Negative for  dysuria, flank pain, frequency, hematuria and urgency.  Musculoskeletal:  Negative for back pain, myalgias and neck pain.  Skin:  Negative for rash.  Allergic/Immunologic: Negative for environmental allergies.  Neurological:  Negative for dizziness, tremors, seizures, weakness and headaches.  Hematological:  Does not bruise/bleed easily.  Psychiatric/Behavioral:  Negative for suicidal ideas. The patient is not nervous/anxious.      Objective:    Physical Exam Telephone visit no exam There were no vitals taken for this visit. Wt Readings from Last 3 Encounters:  07/07/21 178 lb 6.4 oz (80.9 kg)  05/05/21 182 lb 6.4 oz (82.7 kg)  01/05/21 189 lb (85.7 kg)     Health Maintenance Due  Topic Date Due   Pneumonia Vaccine 64+ Years old (1 - PCV) Never done  INFLUENZA VACCINE  Never done    There are no preventive care reminders to display for this patient.  Lab Results  Component Value Date   TSH 3.520 12/11/2020   Lab Results  Component Value Date   WBC 6.4 07/08/2021   HGB 10.7 (L) 07/08/2021   HCT 33.3 (L) 07/08/2021   MCV 86.0 07/08/2021   PLT 196 07/08/2021   Lab Results  Component Value Date   NA 139 07/08/2021   K 3.2 (L) 07/08/2021   CO2 28 07/08/2021   GLUCOSE 124 (H) 07/08/2021   BUN 21 07/08/2021   CREATININE 2.08 (H) 07/08/2021   BILITOT 1.1 07/07/2021   ALKPHOS 167 (H) 07/07/2021   AST 26 07/07/2021   ALT 25 07/07/2021   PROT 7.9 07/07/2021   ALBUMIN 3.2 (L) 07/07/2021   CALCIUM 8.7 (L) 07/08/2021   ANIONGAP 8 07/08/2021   EGFR 31 (L) 05/05/2021   Lab Results  Component Value Date   CHOL 116 07/13/2020   Lab Results  Component Value Date   HDL 32 (L) 07/13/2020   Lab Results  Component Value Date   LDLCALC 68 07/13/2020   Lab Results  Component Value Date   TRIG 80 07/13/2020   Lab Results  Component Value Date   CHOLHDL 3.6 07/13/2020   Lab Results  Component Value Date   HGBA1C 6.2 (H) 07/08/2021      Assessment & Plan:    Problem List Items Addressed This Visit   None  No orders of the defined types were placed in this encounter.    Follow Up Instructions: Patient knows a direct exam will occur in the next 2 weeks   I discussed the assessment and treatment plan with the patient. The patient was provided an opportunity to ask questions and all were answered. The patient agreed with the plan and demonstrated an understanding of the instructions.   The patient was advised to call back or seek an in-person evaluation if the symptoms worsen or if the condition fails to improve as anticipated.  I provided 20 minutes of non-face-to-face time during this encounter  including  median intraservice time , review of notes, labs, imaging, medications  and explaining diagnosis and management to the patient .    Asencion Noble, MD

## 2021-10-02 ENCOUNTER — Other Ambulatory Visit: Payer: Self-pay

## 2021-10-02 MED ORDER — FUROSEMIDE 20 MG PO TABS: 60.0000 mg | ORAL_TABLET | Freq: Every day | ORAL | 0 refills | Status: DC

## 2021-10-15 NOTE — Progress Notes (Signed)
Cardiology Office Note:    Date:  10/16/2021   ID:  Jillian [name], DOB [birth year], MRN [id number]  PCP:  [name], MD   [facility] Providers Cardiologist:  Dorris Carnes, MD     Referring MD: [name], MD   Chief Complaint: post hospital follow-up  History of Present Illness:    Jillian [name] is a 85 y.o. female with a hx of HTN, CVA, CKD stage IV, anemia, and hyperlipidemia. She established care with our group during a hospitalization in Sept 2021 after presenting with SOB and chest pain and had an elevated troponin. At that time, she was also having difficulty speaking and was evaluated by neurology for CVA. She was felt to have demand ischemia in the the setting of CHF. Additionally, she had a 7 beat run of NSVT and a brief run of PAT while on telemetry; no atrial fibrillation seen at that time. She was subsequently found to be in atrial fibrillation but refused loop recorder. She was placed on chronic anticoagulation. She was last seen in office on 12/11/20 by Truitt Merle, NP. She was advised to reduce Lasix to 40 mg/day due to lab work that revealed her kidney function was a little worse than lab work a few weeks prior. She was advised to follow-up in 3-4 months.   Since we last saw her, she was hospitalized in August 2022 for acute on chronic CHF and hypertensive urgency. She was discharged on Lasix 60 mg and K+ 20 mEq, carvedilol 25 mg, telmisartan 40 mg, Eliquis 2.5 mg, and rosuvastatin.   She is here today with her daughter and her 28-year-old grandson. She lives alone with her 36 y/o grandson. She reports her BP has been elevated since she got home from the hospital. She does not weigh herself daily. She denies chest pain, shortness of breath, lower extremity edema, fatigue, palpitations, melena, hematuria, hemoptysis, diaphoresis, weakness, presyncope, syncope, orthopnea, and PND. Reports she had problems with balance. Her daughter reports that she will not use the  walker that they have purchased for her. She says she is able to do light housework and cooking without difficulty. At times, it is difficult to understand what she is saying. Daughter provides some of the history.   Past Medical History:  Diagnosis Date   Acute CVA (cerebrovascular accident) ([place]) 07/12/2020   Hypertension    Renal disorder    CKD Stage IV   Stroke [facility])     Past Surgical History:  Procedure Laterality Date   CARDIAC CATHETERIZATION  2009    Current Medications: Current Meds  Medication Sig   apixaban (ELIQUIS) 2.5 MG TABS tablet Take 1 tablet (2.5 mg total) by mouth 2 (two) times daily.   carvedilol (COREG) 25 MG tablet Take 1 tablet (25 mg total) by mouth 2 (two) times daily.   desoximetasone (TOPICORT) 0.25 % cream Apply 1 application topically 2 (two) times daily. (Patient taking differently: Apply 1 application topically 2 (two) times daily as needed (rash).)   Ferrous Sulfate (IRON) 325 (65 Fe) MG TABS Take 1 tablet by mouth every other day.   furosemide (LASIX) 20 MG tablet Take 3 tablets (60 mg total) by mouth daily. Please keep upcoming appt in December 2022 with Cardiologist before anymore refills. Thank you   hydrALAZINE (APRESOLINE) 25 MG tablet Take 1 tablet (25 mg total) by mouth 2 (two) times daily at 10 AM and 5 PM.   rosuvastatin (CRESTOR) 20 MG tablet Take 1 tablet (20 mg total)  by mouth daily. To lower cholesterol. Please keep upcoming appt in December 2022 with Cardiologist before anymore refills. Thank you   sennosides-docusate sodium (SENOKOT-S) 8.6-50 MG tablet Take 3 tablets by mouth at bedtime.   telmisartan (MICARDIS) 40 MG tablet Take 40 mg by mouth daily.     Allergies:   Penicillins, Aspirin, Atorvastatin, and Diltiazem hcl   Social History   Socioeconomic History   Marital status: Unknown    Spouse name: Not on file   Number of children: Not on file   Years of education: Not on file   Highest education level: Not on file   Occupational History   Not on file  Tobacco Use   Smoking status: Never   Smokeless tobacco: Never  Substance and Sexual Activity   Alcohol use: Not on file   Drug use: Not on file   Sexual activity: Not on file  Other Topics Concern   Not on file  Social History Narrative   Not on file   Social Determinants of Health   Financial Resource Strain: Not on file  Food Insecurity: Not on file  Transportation Needs: Not on file  Physical Activity: Not on file  Stress: Not on file  Social Connections: Not on file     Family History: The patient's family history includes Breast cancer in her mother; CAD in her mother.  ROS:   Please see the history of present illness.  All other systems reviewed and are negative.  Labs/Other Studies Reviewed:    The following studies were reviewed today:  Echo 07/08/21  Left Ventricle: Left ventricular ejection fraction, by estimation, is 60  to 65%. The left ventricle has normal function. The left ventricle has no  regional wall motion abnormalities. The left ventricular internal cavity  size was normal in size. There is   no left ventricular hypertrophy. Left ventricular diastolic parameters  are consistent with Grade III diastolic dysfunction (restrictive).  Elevated left ventricular end-diastolic pressure.  Right Ventricle: The right ventricular size is normal. Right vetricular  wall thickness was not well visualized. Right ventricular systolic  function is normal.  Left Atrium: Left atrial size was severely dilated.  Right Atrium: Right atrial size was normal in size.  Pericardium: Trivial pericardial effusion is present.  Mitral Valve: The mitral valve is grossly normal. Mild mitral valve  regurgitation.  Tricuspid Valve: The tricuspid valve is grossly normal. Tricuspid valve  regurgitation is trivial.  Aortic Valve: The aortic valve is tricuspid. There is mild calcification  of the aortic valve. There is mild thickening of the  aortic valve. There  is mild to moderate aortic valve annular calcification. Aortic valve  regurgitation is not visualized.  Pulmonic Valve: The pulmonic valve was not well visualized. Pulmonic valve  regurgitation is not visualized.  Aorta: The aortic root and ascending aorta are structurally normal, with  no evidence of dilitation.  IAS/Shunts: The atrial septum is grossly normal.   Vas Carotid US Bilateral 07/12/20  Right Carotid: The extracranial vessels were near-normal with only minimal wall thickening or plaque.   Left Carotid: The extracranial vessels were near-normal with only minimal wall thickening or plaque.   Recent Labs: 10/17/2020: NT-Pro BNP 884 12/11/2020: TSH 3.520 07/07/2021: ALT 25; B Natriuretic Peptide 722.5 07/08/2021: BUN 21; Creatinine, Ser 2.08; Hemoglobin 10.7; Platelets 196; Potassium 3.2; Sodium 139 Hgb A1C 6.2 Recent Lipid Panel    Component Value Date/Time   CHOL 116 07/13/2020 0459   TRIG 80 07/13/2020 0459   HDL  32 (L) 07/13/2020 0459   CHOLHDL 3.6 07/13/2020 0459   VLDL 16 07/13/2020 0459   LDLCALC 68 07/13/2020 0459     Risk Assessment/Calculations:    CHA2DS2-VASc Score = 7   This indicates a 11.2% annual risk of stroke. The patient's score is based upon: CHF History: 1 HTN History: 1 Diabetes History: 0 Stroke History: 2 Vascular Disease History: 0 Age Score: 2 Gender Score: 1         Physical Exam:    VS:  BP (!) 198/110 (BP Location: Right Arm, Patient Position: Sitting, Cuff Size: Large)   Pulse 68   Ht 5\' 7"  (1.702 m)   Wt 186 lb 3.2 oz (84.5 kg)   BMI 29.16 kg/m     Wt Readings from Last 3 Encounters:  10/16/21 186 lb 3.2 oz (84.5 kg)  07/07/21 178 lb 6.4 oz (80.9 kg)  05/05/21 182 lb 6.4 oz (82.7 kg)     GEN:  Well nourished, well developed in no acute distress HEENT: Normal NECK: No JVD; No carotid bruits LYMPHATICS: No lymphadenopathy CARDIAC: RRR, no murmurs, rubs, gallops RESPIRATORY:  Clear to auscultation  without rales, wheezing or rhonchi  ABDOMEN: Soft, non-tender, non-distended MUSCULOSKELETAL:  No edema; No deformity  SKIN: Warm and dry NEUROLOGIC:  Alert and oriented x 3 PSYCHIATRIC:  Normal affect   EKG:  EKG is ordered today.  The ekg ordered today demonstrates NSR at rate of 68 bpm with LVH, ST elevation in v1, stable with previous ekg  Diagnoses:    1. Paroxysmal atrial fibrillation (HCC)   2. Medication management   3. Chronic anticoagulation   4. Mixed dyslipidemia   5. Essential hypertension   6. Stroke of unknown etiology ([place])    Assessment and Plan:     PAF on chronic anticoagulation: In NSR today. Denies bleeding issues. She does not report concerns with irregular HR or SOB. Continue Eliquis and beta blocker.   Essential hypertension: BP is very elevated today. 190/100 by my recheck. She states it has been elevated since hospital d/c in August. She does not provide specific readings. Staff assisted her and her daughter with sorting through medications to make certain there are no duplicates. Continue telmisartan, carvedilol, lasix. Will add low dose hydralazine 25 mg twice daily. Encouraged her to closely monitor BP and gave her a BP log. Asked her to call back to report. Will plan for f/u in 4 weeks. Would favor increase of telmisartan to 80 mg if BP remains elevated.   Chronic combined systolic and diastolic CHF: Appears euvolemic today. She does not have symptoms of edema, SOB, orthopnea or PND. Says she follows low sodium diet. Does not monitor weight daily. Encouraged her to keep a daily weight log and gave her instructions on monitoring heart failure. Continue ARB, beta-blocker, diuretic.   Hyperlipidemia: LDL 68 in August 2021. Continue Crestor. Will need to update labs at next office visit.   Stroke: She reports issues with her balance since previous hospitalization for CVA August 2021. Daughter reports that she refuses to use her walker. Followed by neurology.  Continue Eliquis.      Medication Adjustments/Labs and Tests Ordered: Current medicines are reviewed at length with the patient today.  Concerns regarding medicines are outlined above.  Orders Placed This Encounter  Procedures   Basic metabolic panel   CBC   EKG 12-Lead   Meds ordered this encounter  Medications   hydrALAZINE (APRESOLINE) 25 MG tablet    Sig: Take 1  tablet (25 mg total) by mouth 2 (two) times daily at 10 AM and 5 PM.    Dispense:  180 tablet    Refill:  3    Patient Instructions  Medication Instructions:  Your physician has recommended you make the following change in your medication:   Start: Hydralazine (Apresoline) 25mg  tablet two times daily   *If you need a refill on your cardiac medications before your next appointment, please call your pharmacy*   Lab Work: Your physician recommends that you return for lab work Today- CBC, BMET  If you have labs (blood work) drawn today and your tests are completely normal, you will receive your results only by: MyChart Message (if you have [place]) OR A paper copy in the mail If you have any lab test that is abnormal or we need to change your treatment, we will call you to review the results.   Testing/Procedures: None ordered ago    Follow-Up: At [facility], you and your health needs are our priority.  As part of our continuing mission to provide you with exceptional heart care, we have created designated Provider Care Teams.  These Care Teams include your primary Cardiologist (physician) and Advanced Practice Providers (APPs -  Physician Assistants and Nurse Practitioners) who all work together to provide you with the care you need, when you need it.  We recommend signing up for the patient portal called "MyChart".  Sign up information is provided on this After Visit Summary.  MyChart is used to connect with patients for Virtual Visits (Telemedicine).  Patients are able to view lab/test results, encounter  notes, upcoming appointments, etc.  Non-urgent messages can be sent to your provider as well.   To learn more about what you can do with MyChart, go to [email or web address].    Your next appointment:   Please followu up in 4-6 Weeks with Dr. Harrington Challenger or APP   Other Instructions None today    Signed, Emmaline Life, NP  10/16/2021 5:40 PM    [place]

## 2021-10-16 ENCOUNTER — Other Ambulatory Visit: Payer: Self-pay

## 2021-10-16 ENCOUNTER — Encounter (HOSPITAL_BASED_OUTPATIENT_CLINIC_OR_DEPARTMENT_OTHER): Payer: Self-pay | Admitting: Nurse Practitioner

## 2021-10-16 ENCOUNTER — Other Ambulatory Visit: Payer: Self-pay | Admitting: Internal Medicine

## 2021-10-16 ENCOUNTER — Ambulatory Visit (HOSPITAL_BASED_OUTPATIENT_CLINIC_OR_DEPARTMENT_OTHER): Payer: Medicare Other | Admitting: Nurse Practitioner

## 2021-10-16 VITALS — BP 198/110 | HR 68 | Ht 67.0 in | Wt 186.2 lb

## 2021-10-16 DIAGNOSIS — I10 Essential (primary) hypertension: Secondary | ICD-10-CM | POA: Diagnosis not present

## 2021-10-16 DIAGNOSIS — Z7901 Long term (current) use of anticoagulants: Secondary | ICD-10-CM

## 2021-10-16 DIAGNOSIS — I480 Paroxysmal atrial fibrillation: Secondary | ICD-10-CM

## 2021-10-16 DIAGNOSIS — I639 Cerebral infarction, unspecified: Secondary | ICD-10-CM | POA: Diagnosis not present

## 2021-10-16 DIAGNOSIS — E782 Mixed hyperlipidemia: Secondary | ICD-10-CM | POA: Diagnosis not present

## 2021-10-16 DIAGNOSIS — Z79899 Other long term (current) drug therapy: Secondary | ICD-10-CM

## 2021-10-16 MED ORDER — HYDRALAZINE HCL 25 MG PO TABS: 25.0000 mg | ORAL_TABLET | Freq: Two times a day (BID) | ORAL | 3 refills | Status: DC

## 2021-10-16 NOTE — Patient Instructions (Addendum)
Medication Instructions:  Your physician has recommended you make the following change in your medication:   Start: Hydralazine (Apresoline) 25mg  tablet two times daily   *If you need a refill on your cardiac medications before your next appointment, please call your pharmacy*   Lab Work: Your physician recommends that you return for lab work Today- CBC, BMET  If you have labs (blood work) drawn today and your tests are completely normal, you will receive your results only by: MyChart Message (if you have [place]) OR A paper copy in the mail If you have any lab test that is abnormal or we need to change your treatment, we will call you to review the results.   Testing/Procedures: None ordered ago    Follow-Up: At [facility], you and your health needs are our priority.  As part of our continuing mission to provide you with exceptional heart care, we have created designated Provider Care Teams.  These Care Teams include your primary Cardiologist (physician) and Advanced Practice Providers (APPs -  Physician Assistants and Nurse Practitioners) who all work together to provide you with the care you need, when you need it.  We recommend signing up for the patient portal called "MyChart".  Sign up information is provided on this After Visit Summary.  MyChart is used to connect with patients for Virtual Visits (Telemedicine).  Patients are able to view lab/test results, encounter notes, upcoming appointments, etc.  Non-urgent messages can be sent to your provider as well.   To learn more about what you can do with MyChart, go to [email or web address].    Your next appointment:   Please followu up in 4-6 Weeks with Dr. Harrington Challenger or APP   Other Instructions None today

## 2021-10-17 LAB — BASIC METABOLIC PANEL
BUN/Creatinine Ratio: 12 (ref 12–28)
BUN: 19 mg/dL (ref 8–27)
CO2: 27 mmol/L (ref 20–29)
Calcium: 9.1 mg/dL (ref 8.7–10.3)
Chloride: 107 mmol/L — ABNORMAL HIGH (ref 96–106)
Creatinine, Ser: 1.57 mg/dL — ABNORMAL HIGH (ref 0.57–1.00)
Glucose: 88 mg/dL (ref 70–99)
Potassium: 4.3 mmol/L (ref 3.5–5.2)
Sodium: 144 mmol/L (ref 134–144)
eGFR: 33 mL/min/{1.73_m2} — ABNORMAL LOW (ref >59)

## 2021-10-17 LAB — CBC
Hematocrit: 34.6 % (ref 34.0–46.6)
Hemoglobin: 10.6 g/dL — ABNORMAL LOW (ref 11.1–15.9)
MCH: 27.2 pg (ref 26.6–33.0)
MCHC: 30.6 g/dL — ABNORMAL LOW (ref 31.5–35.7)
MCV: 89 fL (ref 79–97)
Platelets: 220 10*3/uL (ref 150–450)
RBC: 3.9 x10E6/uL (ref 3.77–5.28)
RDW: 12.8 % (ref 11.7–15.4)
WBC: 6.4 10*3/uL (ref 3.4–10.8)

## 2021-10-19 ENCOUNTER — Encounter (HOSPITAL_BASED_OUTPATIENT_CLINIC_OR_DEPARTMENT_OTHER): Payer: Self-pay

## 2021-10-19 NOTE — Progress Notes (Signed)
Results shared with patient via my chart!

## 2021-10-20 ENCOUNTER — Encounter (HOSPITAL_BASED_OUTPATIENT_CLINIC_OR_DEPARTMENT_OTHER): Payer: Self-pay

## 2021-11-02 ENCOUNTER — Encounter (HOSPITAL_BASED_OUTPATIENT_CLINIC_OR_DEPARTMENT_OTHER): Payer: Self-pay
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
## 2021-11-18 ENCOUNTER — Other Ambulatory Visit: Payer: Self-pay

## 2021-11-18 ENCOUNTER — Ambulatory Visit (HOSPITAL_BASED_OUTPATIENT_CLINIC_OR_DEPARTMENT_OTHER): Payer: Medicare Other | Admitting: Nurse Practitioner

## 2021-11-18 ENCOUNTER — Encounter (HOSPITAL_BASED_OUTPATIENT_CLINIC_OR_DEPARTMENT_OTHER): Payer: Self-pay | Admitting: Nurse Practitioner

## 2021-11-18 VITALS — BP 168/86 | HR 70 | Ht 67.0 in | Wt 185.0 lb

## 2021-11-18 DIAGNOSIS — Z7901 Long term (current) use of anticoagulants: Secondary | ICD-10-CM | POA: Diagnosis not present

## 2021-11-18 DIAGNOSIS — Z8673 Personal history of transient ischemic attack (TIA), and cerebral infarction without residual deficits: Secondary | ICD-10-CM | POA: Diagnosis not present

## 2021-11-18 DIAGNOSIS — I10 Essential (primary) hypertension: Secondary | ICD-10-CM

## 2021-11-18 DIAGNOSIS — Z79899 Other long term (current) drug therapy: Secondary | ICD-10-CM

## 2021-11-18 DIAGNOSIS — E782 Mixed hyperlipidemia: Secondary | ICD-10-CM | POA: Diagnosis not present

## 2021-11-18 DIAGNOSIS — I480 Paroxysmal atrial fibrillation: Secondary | ICD-10-CM | POA: Diagnosis not present

## 2021-11-18 DIAGNOSIS — I5042 Chronic combined systolic (congestive) and diastolic (congestive) heart failure: Secondary | ICD-10-CM | POA: Diagnosis not present

## 2021-11-18 MED ORDER — TELMISARTAN 80 MG PO TABS: 80.0000 mg | ORAL_TABLET | Freq: Every day | ORAL | 3 refills | Status: DC

## 2021-11-18 NOTE — Patient Instructions (Signed)
Medication Instructions:  Your physician has recommended you make the following change in your medication:   Change: Please increase your Telmisartan to 80mg  daily  *If you need a refill on your cardiac medications before your next appointment, please call your pharmacy*   Lab Work: Your physician recommends that you return for lab work today- Lipid panel and Liver Function Panel   If you have labs (blood work) drawn today and your tests are completely normal, you will receive your results only by: MyChart Message (if you have MyChart) OR A paper copy in the mail If you have any lab test that is abnormal or we need to change your treatment, we will call you to review the results.   Testing/Procedures: None ordered today    Follow-Up: At [facility], you and your health needs are our priority.  As part of our continuing mission to provide you with exceptional heart care, we have created designated Provider Care Teams.  These Care Teams include your primary Cardiologist (physician) and Advanced Practice Providers (APPs -  Physician Assistants and Nurse Practitioners) who all work together to provide you with the care you need, when you need it.  We recommend signing up for the patient portal called "MyChart".  Sign up information is provided on this After Visit Summary.  MyChart is used to connect with patients for Virtual Visits (Telemedicine).  Patients are able to view lab/test results, encounter notes, upcoming appointments, etc.  Non-urgent messages can be sent to your provider as well.   To learn more about what you can do with MyChart, go to [email or web address].    Your next appointment:   6 month(s), patient would like an AM appointment  The format for your next appointment:   In Person  Provider:   Dorris Carnes, MD

## 2021-11-19 ENCOUNTER — Other Ambulatory Visit (HOSPITAL_BASED_OUTPATIENT_CLINIC_OR_DEPARTMENT_OTHER): Payer: Self-pay

## 2021-11-19 DIAGNOSIS — Z79899 Other long term (current) drug therapy: Secondary | ICD-10-CM

## 2021-11-19 LAB — LIPID PANEL
Chol/HDL Ratio: 5.8 ratio — ABNORMAL HIGH (ref 0.0–4.4)
Cholesterol, Total: 231 mg/dL — ABNORMAL HIGH (ref 100–199)
HDL: 40 mg/dL (ref >39)
LDL Chol Calc (NIH): 164 mg/dL — ABNORMAL HIGH (ref 0–99)
Triglycerides: 149 mg/dL (ref 0–149)
VLDL Cholesterol Cal: 27 mg/dL (ref 5–40)

## 2021-11-19 LAB — HEPATIC FUNCTION PANEL
ALT: 13 IU/L (ref 0–32)
AST: 25 IU/L (ref 0–40)
Albumin: 3.4 g/dL — ABNORMAL LOW (ref 3.6–4.6)
Alkaline Phosphatase: 200 IU/L — ABNORMAL HIGH (ref 44–121)
Bilirubin Total: 0.3 mg/dL (ref 0.0–1.2)
Bilirubin, Direct: <0.1 mg/dL (ref 0.00–0.40)
Total Protein: 7 g/dL (ref 6.0–8.5)

## 2021-11-20 ENCOUNTER — Telehealth (HOSPITAL_BASED_OUTPATIENT_CLINIC_OR_DEPARTMENT_OTHER): Payer: Self-pay

## 2021-11-20 DIAGNOSIS — Z79899 Other long term (current) drug therapy: Secondary | ICD-10-CM

## 2021-11-20 DIAGNOSIS — E782 Mixed hyperlipidemia: Secondary | ICD-10-CM

## 2021-11-20 MED ORDER — ROSUVASTATIN CALCIUM 40 MG PO TABS: 40.0000 mg | ORAL_TABLET | Freq: Every day | ORAL | 3 refills | Status: DC

## 2021-11-20 NOTE — Telephone Encounter (Signed)
Called back and was able to speak with patients granddaughter who states she is taking the 20mg  of rosuvastatin. We will increase her to 40mg  and have her come back for repeat labs in  2 months    New prescription sent to pharmacy and lab slips placed in mail.

## 2021-11-20 NOTE — Telephone Encounter (Signed)
Attempted to call patient to verify Rosuvastatin dose, no answer and VM not set up.    Will wait to increase rosuvastatin and order lipid panel until granddaughter verify dose.

## 2021-12-07 ENCOUNTER — Other Ambulatory Visit: Payer: Self-pay | Admitting: *Deleted

## 2021-12-07 DIAGNOSIS — I480 Paroxysmal atrial fibrillation: Secondary | ICD-10-CM

## 2021-12-07 MED ORDER — APIXABAN 2.5 MG PO TABS: 2.5000 mg | ORAL_TABLET | Freq: Two times a day (BID) | ORAL | 5 refills | Status: DC

## 2021-12-07 NOTE — Telephone Encounter (Signed)
Eliquis 2.5mg  paper refill request received. Patient is 86 years old, weight-83.9kg, Crea-1.57 on 10/16/2021, Diagnosis-Afib, and last seen by Christen Bame on 11/18/2021. Dose is appropriate based on dosing criteria. Will send in refill to requested pharmacy.

## 2021-12-24 DIAGNOSIS — D631 Anemia in chronic kidney disease: Secondary | ICD-10-CM | POA: Diagnosis not present

## 2021-12-24 DIAGNOSIS — I2510 Atherosclerotic heart disease of native coronary artery without angina pectoris: Secondary | ICD-10-CM | POA: Diagnosis not present

## 2021-12-24 DIAGNOSIS — I129 Hypertensive chronic kidney disease with stage 1 through stage 4 chronic kidney disease, or unspecified chronic kidney disease: Secondary | ICD-10-CM | POA: Diagnosis not present

## 2021-12-24 DIAGNOSIS — I639 Cerebral infarction, unspecified: Secondary | ICD-10-CM | POA: Diagnosis not present

## 2021-12-24 DIAGNOSIS — N184 Chronic kidney disease, stage 4 (severe): Secondary | ICD-10-CM | POA: Diagnosis not present

## 2021-12-24 DIAGNOSIS — I5030 Unspecified diastolic (congestive) heart failure: Secondary | ICD-10-CM | POA: Diagnosis not present

## 2021-12-24 DIAGNOSIS — E785 Hyperlipidemia, unspecified: Secondary | ICD-10-CM | POA: Diagnosis not present

## 2022-01-08 ENCOUNTER — Other Ambulatory Visit: Payer: Self-pay

## 2022-01-08 ENCOUNTER — Other Ambulatory Visit: Payer: Self-pay | Admitting: Internal Medicine

## 2022-01-08 DIAGNOSIS — I480 Paroxysmal atrial fibrillation: Secondary | ICD-10-CM

## 2022-01-08 MED ORDER — APIXABAN 2.5 MG PO TABS: 2.5000 mg | ORAL_TABLET | Freq: Two times a day (BID) | ORAL | 5 refills | Status: DC

## 2022-01-08 NOTE — Telephone Encounter (Signed)
Eliquis 5 mg refill request received. Patient is 86 years old, weight- 83.9 kg, Crea- 1.59 on 10/16/21 , Diagnosis- afib, and last seen by Christen Bame, NP on 11/18/21. Dose is appropriate based on dosing criteria. Will send in refill to requested pharmacy.

## 2022-01-11 ENCOUNTER — Other Ambulatory Visit: Payer: Self-pay

## 2022-01-11 DIAGNOSIS — I480 Paroxysmal atrial fibrillation: Secondary | ICD-10-CM

## 2022-01-11 MED ORDER — APIXABAN 2.5 MG PO TABS: 2.5000 mg | ORAL_TABLET | Freq: Two times a day (BID) | ORAL | 6 refills | Status: DC

## 2022-01-11 NOTE — Telephone Encounter (Signed)
Eliquis 2.5 mg refill request received. Patient is 86 years old, weight- 93.9 kg, Crea- 1.57 on 10/16/21, Diagnosis- PAF, and last seen by Christen Bame, NP on 1/4/2.Marland Kitchen Dose is appropriate based on dosing criteria. Will send in refill to requested pharmacy.

## 2022-01-18 ENCOUNTER — Other Ambulatory Visit: Payer: Self-pay | Admitting: Internal Medicine

## 2022-01-18 DIAGNOSIS — E782 Mixed hyperlipidemia: Secondary | ICD-10-CM

## 2022-02-16 ENCOUNTER — Telehealth: Payer: Medicare Other

## 2022-02-16 ENCOUNTER — Ambulatory Visit: Payer: Self-pay | Admitting: *Deleted

## 2022-02-16 ENCOUNTER — Encounter: Payer: Self-pay | Admitting: Nurse Practitioner

## 2022-02-16 NOTE — Telephone Encounter (Signed)
Pt daughter stated pt is experiencing congestion sinus issues and nose bleeds. Nose bleeds have been every other day non today so far.  ?No appointments available this has been going on for about a week.   ?  ? ?Chief Complaint: Cough ?Symptoms: productive cough, yellowish with bloody flecks, runny nose with bloody streaks with blowing nose, nasal drainage down throat, coughing up bloody flecks. ?Frequency: 1 week ?Pertinent Negatives: Patient denies SOB ?Disposition: [] ED /[x] Urgent Care (no appt availability in office) / [] Appointment(In office/virtual)/ []  [facility]/ [] Home Care/ [] Refused Recommended Disposition /[] [facility]/ []  Follow-up with PCP ?Additional Notes: Daughter calling, not with pt, unable to do virtual appt via [facility] platform. Advised UC.   ?Reason for Disposition ? Coughing up rusty-colored (reddish-brown) sputum ? ?Answer Assessment - Initial Assessment Questions ?1. ONSET: "When did the cough begin?"  ?    1 week ago ?2. SEVERITY: "How bad is the cough today?"  ?    Awake at night ?3. SPUTUM: "Describe the color of your sputum" (none, dry cough; clear, white, yellow, green) ?    yellowish ?4. HEMOPTYSIS: "Are you coughing up any blood?" If so ask: "How much?" (flecks, streaks, tablespoons, etc.) ?    Blow nose, clean it out, blood flecks ?5. DIFFICULTY BREATHING: "Are you having difficulty breathing?" If Yes, ask: "How bad is it?" (e.g., mild, moderate, severe)  ?  - MILD: No SOB at rest, mild SOB with walking, speaks normally in sentences, can lie down, no retractions, pulse < 100.  ?  - MODERATE: SOB at rest, SOB with minimal exertion and prefers to sit, cannot lie down flat, speaks in phrases, mild retractions, audible wheezing, pulse 100-120.  ?  - SEVERE: Very SOB at rest, speaks in single words, struggling to breathe, sitting hunched forward, retractions, pulse > 120  ?    None ?6. FEVER: "Do you have a fever?" If Yes, ask: "What is your temperature, how was it  measured, and when did it start?" ?    no ?7. CARDIAC HISTORY: "Do you have any history of heart disease?" (e.g., heart attack, congestive heart failure)  ?    *No Answer* ?8. LUNG HISTORY: "Do you have any history of lung disease?"  (e.g., pulmonary embolus, asthma, emphysema) ?    *No Answer* ?9. PE RISK FACTORS: "Do you have a history of blood clots?" (or: recent major surgery, recent prolonged travel, bedridden) ?    *No Answer* ?10. OTHER SYMPTOMS: "Do you have any other symptoms?" (e.g., runny nose, wheezing, chest pain) ?      Runny nose, sinus type pain, ear pressure, drainage down throat, cough up bloody flecks, "2-3pieces" Did have body aches ? ?Protocols used: Cough - Acute Productive-A-AH ? ?

## 2022-02-16 NOTE — Telephone Encounter (Signed)
Left message on voicemail to return call.

## 2022-02-19 ENCOUNTER — Emergency Department (HOSPITAL_COMMUNITY): Payer: Medicare Other

## 2022-02-19 ENCOUNTER — Other Ambulatory Visit: Payer: Self-pay

## 2022-02-19 ENCOUNTER — Inpatient Hospital Stay (HOSPITAL_COMMUNITY)
Admission: EM | Admit: 2022-02-19 | Discharge: 2022-02-23 | DRG: 291 | Disposition: A | Discharge status: Home Health | Payer: Medicare Other | Attending: Internal Medicine | Admitting: Internal Medicine

## 2022-02-19 ENCOUNTER — Encounter (HOSPITAL_COMMUNITY): Payer: Self-pay | Admitting: Internal Medicine

## 2022-02-19 DIAGNOSIS — Z880 Allergy status to penicillin: Secondary | ICD-10-CM | POA: Diagnosis not present

## 2022-02-19 DIAGNOSIS — I130 Hypertensive heart and chronic kidney disease with heart failure and stage 1 through stage 4 chronic kidney disease, or unspecified chronic kidney disease: Secondary | ICD-10-CM | POA: Diagnosis not present

## 2022-02-19 DIAGNOSIS — N184 Chronic kidney disease, stage 4 (severe): Secondary | ICD-10-CM | POA: Diagnosis not present

## 2022-02-19 DIAGNOSIS — R6889 Other general symptoms and signs: Secondary | ICD-10-CM | POA: Diagnosis not present

## 2022-02-19 DIAGNOSIS — I5043 Acute on chronic combined systolic (congestive) and diastolic (congestive) heart failure: Secondary | ICD-10-CM | POA: Diagnosis not present

## 2022-02-19 DIAGNOSIS — J811 Chronic pulmonary edema: Secondary | ICD-10-CM | POA: Diagnosis not present

## 2022-02-19 DIAGNOSIS — Z7901 Long term (current) use of anticoagulants: Secondary | ICD-10-CM

## 2022-02-19 DIAGNOSIS — E785 Hyperlipidemia, unspecified: Secondary | ICD-10-CM | POA: Diagnosis not present

## 2022-02-19 DIAGNOSIS — D631 Anemia in chronic kidney disease: Secondary | ICD-10-CM | POA: Diagnosis present

## 2022-02-19 DIAGNOSIS — Z886 Allergy status to analgesic agent status: Secondary | ICD-10-CM

## 2022-02-19 DIAGNOSIS — J209 Acute bronchitis, unspecified: Secondary | ICD-10-CM | POA: Diagnosis not present

## 2022-02-19 DIAGNOSIS — R0789 Other chest pain: Secondary | ICD-10-CM | POA: Diagnosis not present

## 2022-02-19 DIAGNOSIS — Z743 Need for continuous supervision: Secondary | ICD-10-CM | POA: Diagnosis not present

## 2022-02-19 DIAGNOSIS — Z888 Allergy status to other drugs, medicaments and biological substances status: Secondary | ICD-10-CM | POA: Diagnosis not present

## 2022-02-19 DIAGNOSIS — Z8249 Family history of ischemic heart disease and other diseases of the circulatory system: Secondary | ICD-10-CM | POA: Diagnosis not present

## 2022-02-19 DIAGNOSIS — R0602 Shortness of breath: Secondary | ICD-10-CM

## 2022-02-19 DIAGNOSIS — I161 Hypertensive emergency: Secondary | ICD-10-CM | POA: Diagnosis not present

## 2022-02-19 DIAGNOSIS — Z79899 Other long term (current) drug therapy: Secondary | ICD-10-CM | POA: Diagnosis not present

## 2022-02-19 DIAGNOSIS — Z803 Family history of malignant neoplasm of breast: Secondary | ICD-10-CM

## 2022-02-19 DIAGNOSIS — I509 Heart failure, unspecified: Secondary | ICD-10-CM | POA: Diagnosis not present

## 2022-02-19 DIAGNOSIS — N179 Acute kidney failure, unspecified: Secondary | ICD-10-CM | POA: Diagnosis not present

## 2022-02-19 DIAGNOSIS — I5032 Chronic diastolic (congestive) heart failure: Secondary | ICD-10-CM | POA: Diagnosis present

## 2022-02-19 DIAGNOSIS — R079 Chest pain, unspecified: Secondary | ICD-10-CM | POA: Diagnosis not present

## 2022-02-19 DIAGNOSIS — I110 Hypertensive heart disease with heart failure: Secondary | ICD-10-CM | POA: Diagnosis not present

## 2022-02-19 DIAGNOSIS — Z8673 Personal history of transient ischemic attack (TIA), and cerebral infarction without residual deficits: Secondary | ICD-10-CM

## 2022-02-19 DIAGNOSIS — I480 Paroxysmal atrial fibrillation: Secondary | ICD-10-CM | POA: Diagnosis not present

## 2022-02-19 DIAGNOSIS — R059 Cough, unspecified: Secondary | ICD-10-CM | POA: Diagnosis not present

## 2022-02-19 DIAGNOSIS — I160 Hypertensive urgency: Secondary | ICD-10-CM | POA: Diagnosis not present

## 2022-02-19 DIAGNOSIS — Z20822 Contact with and (suspected) exposure to covid-19: Secondary | ICD-10-CM | POA: Diagnosis present

## 2022-02-19 DIAGNOSIS — J9811 Atelectasis: Secondary | ICD-10-CM | POA: Diagnosis not present

## 2022-02-19 DIAGNOSIS — I517 Cardiomegaly: Secondary | ICD-10-CM | POA: Diagnosis not present

## 2022-02-19 LAB — CBC WITH DIFFERENTIAL/PLATELET
Abs Immature Granulocytes: 0.03 10*3/uL (ref 0.00–0.07)
Basophils Absolute: 0 10*3/uL (ref 0.0–0.1)
Basophils Relative: 0 %
Eosinophils Absolute: 0.1 10*3/uL (ref 0.0–0.5)
Eosinophils Relative: 2 %
HCT: 40 % (ref 36.0–46.0)
Hemoglobin: 12.1 g/dL (ref 12.0–15.0)
Immature Granulocytes: 0 %
Lymphocytes Relative: 26 %
Lymphs Abs: 2 10*3/uL (ref 0.7–4.0)
MCH: 27.1 pg (ref 26.0–34.0)
MCHC: 30.3 g/dL (ref 30.0–36.0)
MCV: 89.5 fL (ref 80.0–100.0)
Monocytes Absolute: 1 10*3/uL (ref 0.1–1.0)
Monocytes Relative: 13 %
Neutro Abs: 4.4 10*3/uL (ref 1.7–7.7)
Neutrophils Relative %: 59 %
Platelets: 151 10*3/uL (ref 150–400)
RBC: 4.47 MIL/uL (ref 3.87–5.11)
RDW: 13.6 % (ref 11.5–15.5)
WBC: 7.5 10*3/uL (ref 4.0–10.5)
nRBC: 0 % (ref 0.0–0.2)

## 2022-02-19 LAB — I-STAT VENOUS BLOOD GAS, ED
Acid-Base Excess: 3 mmol/L — ABNORMAL HIGH (ref 0.0–2.0)
Bicarbonate: 28.3 mmol/L — ABNORMAL HIGH (ref 20.0–28.0)
Calcium, Ion: 1.13 mmol/L — ABNORMAL LOW (ref 1.15–1.40)
HCT: 35 % — ABNORMAL LOW (ref 36.0–46.0)
Hemoglobin: 11.9 g/dL — ABNORMAL LOW (ref 12.0–15.0)
O2 Saturation: 100 %
Potassium: 4.1 mmol/L (ref 3.5–5.1)
Sodium: 140 mmol/L (ref 135–145)
TCO2: 30 mmol/L (ref 22–32)
pCO2, Ven: 44.2 mmHg (ref 44–60)
pH, Ven: 7.414 (ref 7.25–7.43)
pO2, Ven: 205 mmHg — ABNORMAL HIGH (ref 32–45)

## 2022-02-19 LAB — COMPREHENSIVE METABOLIC PANEL
ALT: 13 U/L (ref 0–44)
AST: 22 U/L (ref 15–41)
Albumin: 2.5 g/dL — ABNORMAL LOW (ref 3.5–5.0)
Alkaline Phosphatase: 127 U/L — ABNORMAL HIGH (ref 38–126)
Anion gap: 6 (ref 5–15)
BUN: 23 mg/dL (ref 8–23)
CO2: 24 mmol/L (ref 22–32)
Calcium: 8.5 mg/dL — ABNORMAL LOW (ref 8.9–10.3)
Chloride: 108 mmol/L (ref 98–111)
Creatinine, Ser: 2.14 mg/dL — ABNORMAL HIGH (ref 0.44–1.00)
GFR, Estimated: 22 mL/min — ABNORMAL LOW (ref >60)
Glucose, Bld: 127 mg/dL — ABNORMAL HIGH (ref 70–99)
Potassium: 4.1 mmol/L (ref 3.5–5.1)
Sodium: 138 mmol/L (ref 135–145)
Total Bilirubin: 0.6 mg/dL (ref 0.3–1.2)
Total Protein: 7.3 g/dL (ref 6.5–8.1)

## 2022-02-19 LAB — RESP PANEL BY RT-PCR (FLU A&B, COVID) ARPGX2
Influenza A by PCR: NEGATIVE
Influenza B by PCR: NEGATIVE
SARS Coronavirus 2 by RT PCR: NEGATIVE

## 2022-02-19 LAB — BRAIN NATRIURETIC PEPTIDE: B Natriuretic Peptide: 905.4 pg/mL — ABNORMAL HIGH (ref 0.0–100.0)

## 2022-02-19 LAB — TROPONIN I (HIGH SENSITIVITY)
Troponin I (High Sensitivity): 67 ng/L — ABNORMAL HIGH (ref <18)
Troponin I (High Sensitivity): 63 ng/L — ABNORMAL HIGH (ref <18)

## 2022-02-19 MED ORDER — IPRATROPIUM-ALBUTEROL 0.5-2.5 (3) MG/3ML IN SOLN
3.0000 mL | Freq: Once | RESPIRATORY_TRACT | Status: AC
  Administered 2022-02-19: 3 mL via RESPIRATORY_TRACT
  Filled 2022-02-19: qty 3

## 2022-02-19 MED ORDER — FUROSEMIDE 20 MG PO TABS
40.0000 mg | ORAL_TABLET | Freq: Every day | ORAL | Status: DC
Start: 2022-02-20 — End: 2022-02-19
  Filled 2022-02-19: qty 2

## 2022-02-19 MED ORDER — IRBESARTAN 300 MG PO TABS
300.0000 mg | ORAL_TABLET | Freq: Every day | ORAL | Status: DC
  Administered 2022-02-20: 300 mg via ORAL
  Filled 2022-02-19: qty 1

## 2022-02-19 MED ORDER — SENNOSIDES-DOCUSATE SODIUM 8.6-50 MG PO TABS
3.0000 | ORAL_TABLET | Freq: Every day | ORAL | Status: DC
  Administered 2022-02-19 – 2022-02-21 (×3): 3 via ORAL
  Filled 2022-02-19 (×4): qty 3

## 2022-02-19 MED ORDER — ALBUTEROL SULFATE (2.5 MG/3ML) 0.083% IN NEBU
2.5000 mg | INHALATION_SOLUTION | RESPIRATORY_TRACT | Status: DC
  Administered 2022-02-20 (×5): 2.5 mg via RESPIRATORY_TRACT
  Filled 2022-02-19 (×5): qty 3

## 2022-02-19 MED ORDER — HYDRALAZINE HCL 25 MG PO TABS
25.0000 mg | ORAL_TABLET | Freq: Two times a day (BID) | ORAL | Status: DC
  Administered 2022-02-19 – 2022-02-20 (×2): 25 mg via ORAL
  Filled 2022-02-19 (×2): qty 1

## 2022-02-19 MED ORDER — HYDRALAZINE HCL 20 MG/ML IJ SOLN
10.0000 mg | Freq: Once | INTRAMUSCULAR | Status: AC
  Administered 2022-02-20: 10 mg via INTRAVENOUS
  Filled 2022-02-19: qty 1

## 2022-02-19 MED ORDER — CARVEDILOL 12.5 MG PO TABS
25.0000 mg | ORAL_TABLET | Freq: Two times a day (BID) | ORAL | Status: DC
  Administered 2022-02-19 – 2022-02-23 (×8): 25 mg via ORAL
  Filled 2022-02-19 (×8): qty 2

## 2022-02-19 MED ORDER — METHYLPREDNISOLONE SODIUM SUCC 125 MG IJ SOLR
80.0000 mg | INTRAMUSCULAR | Status: DC
Start: 2022-02-19 — End: 2022-02-20
  Administered 2022-02-19: 80 mg via INTRAVENOUS
  Filled 2022-02-19: qty 2

## 2022-02-19 MED ORDER — FUROSEMIDE 10 MG/ML IJ SOLN
20.0000 mg | Freq: Once | INTRAMUSCULAR | Status: AC
  Administered 2022-02-20: 20 mg via INTRAVENOUS
  Filled 2022-02-19: qty 2

## 2022-02-19 MED ORDER — APIXABAN 2.5 MG PO TABS
2.5000 mg | ORAL_TABLET | Freq: Two times a day (BID) | ORAL | Status: DC
Start: 2022-02-19 — End: 2022-02-23
  Administered 2022-02-19 – 2022-02-23 (×8): 2.5 mg via ORAL
  Filled 2022-02-19 (×8): qty 1

## 2022-02-19 MED ORDER — FERROUS SULFATE 325 (65 FE) MG PO TABS
325.0000 mg | ORAL_TABLET | ORAL | Status: DC
  Administered 2022-02-20 – 2022-02-22 (×2): 325 mg via ORAL
  Filled 2022-02-19 (×3): qty 1

## 2022-02-19 MED ORDER — MAGNESIUM SULFATE 2 GM/50ML IV SOLN
2.0000 g | Freq: Once | INTRAVENOUS | Status: AC
  Administered 2022-02-19: 2 g via INTRAVENOUS
  Filled 2022-02-19: qty 50

## 2022-02-19 MED ORDER — FUROSEMIDE 20 MG PO TABS
80.0000 mg | ORAL_TABLET | Freq: Every day | ORAL | Status: DC
  Administered 2022-02-20: 80 mg via ORAL
  Filled 2022-02-19: qty 4

## 2022-02-19 MED ORDER — LABETALOL HCL 5 MG/ML IV SOLN
10.0000 mg | Freq: Once | INTRAVENOUS | Status: AC
Start: 2022-02-19 — End: 2022-02-19
  Administered 2022-02-19: 10 mg via INTRAVENOUS
  Filled 2022-02-19: qty 4

## 2022-02-19 MED ORDER — ALBUTEROL SULFATE (2.5 MG/3ML) 0.083% IN NEBU: 2.5000 mg | INHALATION_SOLUTION | RESPIRATORY_TRACT | Status: DC | PRN

## 2022-02-19 MED ORDER — LABETALOL HCL 5 MG/ML IV SOLN
10.0000 mg | INTRAVENOUS | Status: DC | PRN
  Administered 2022-02-19: 10 mg via INTRAVENOUS

## 2022-02-19 MED ORDER — SODIUM CHLORIDE 0.9 % IV SOLN
500.0000 mg | INTRAVENOUS | Status: DC
  Administered 2022-02-19: 500 mg via INTRAVENOUS
  Filled 2022-02-19 (×2): qty 5

## 2022-02-19 MED ORDER — BUDESONIDE 0.25 MG/2ML IN SUSP
0.2500 mg | Freq: Two times a day (BID) | RESPIRATORY_TRACT | Status: DC
Start: 2022-02-19 — End: 2022-02-23
  Administered 2022-02-19 – 2022-02-23 (×8): 0.25 mg via RESPIRATORY_TRACT
  Filled 2022-02-19 (×8): qty 2

## 2022-02-19 MED ORDER — ROSUVASTATIN CALCIUM 20 MG PO TABS
40.0000 mg | ORAL_TABLET | Freq: Every day | ORAL | Status: DC
  Administered 2022-02-20 – 2022-02-22 (×3): 40 mg via ORAL
  Filled 2022-02-19 (×3): qty 2

## 2022-02-19 NOTE — ED Provider Notes (Signed)
?[facility] ?Provider Note ? ? ?CSN: [id number] ?Arrival date & time: 02/19/22  1916 ? ?  ? ?History ? ?Chief Complaint  ?Patient presents with  ? Shortness of Breath  ? ? ?Jillian [name] is a 86 y.o. female w/ HTN, CVA, CKD stage IV, anemia, and hyperlipidemia (per chart review) presenting for [facility] w/ associated cough, congestion, runny nose.  Patient reports some chest pain when she coughs.  She has had productive yellow sputum.  Patient is around young children and family recently traveled back from [place].  Patient denies any increased weight gain or worsening leg swelling.  Patient was also found to be hypertensive by EMS and was given nitro with some improvement in her pressures but her pressures are now elevated again.  Patient reports a mild headache that started after she was given the nitro. ? ? ?Shortness of Breath ? ?  ? ?Home Medications ?Prior to Admission medications   ?Medication Sig Start Date End Date Taking? Authorizing Provider  ?apixaban (ELIQUIS) 2.5 MG TABS tablet Take 1 tablet (2.5 mg total) by mouth 2 (two) times daily. 01/11/22   Swinyer, Lanice Schwab, NP  ?carvedilol (COREG) 25 MG tablet Take 1 tablet (25 mg total) by mouth 2 (two) times daily. 05/05/21 10/16/21  [name], MD  ?desoximetasone (TOPICORT) 0.25 % cream Apply 1 application topically 2 (two) times daily. ?Patient not taking: Reported on 11/18/2021 01/05/21   [name], MD  ?Ferrous Sulfate (IRON) 325 (65 Fe) MG TABS Take 1 tablet by mouth every other day. 07/31/21   [provider]  ?furosemide (LASIX) 20 MG tablet TAKE 3 TABLETS BY MOUTH ONCE DAILY KEEP  DECEMBER  APPT  WITH  CARDIOLOGIST  FOR  FURTHER  REFILLS 01/08/22   Fay Records, MD  ?hydrALAZINE (APRESOLINE) 25 MG tablet Take 1 tablet (25 mg total) by mouth 2 (two) times daily at 10 AM and 5 PM. 10/16/21 01/14/22  Swinyer, Lanice Schwab, NP  ?rosuvastatin (CRESTOR) 40 MG tablet Take 1 tablet (40 mg total) by mouth  daily. 11/20/21 02/18/22  Swinyer, Lanice Schwab, NP  ?sennosides-docusate sodium (SENOKOT-S) 8.6-50 MG tablet Take 3 tablets by mouth at bedtime. 10/01/21   [name], MD  ?telmisartan (MICARDIS) 80 MG tablet Take 1 tablet (80 mg total) by mouth daily. 11/18/21   Swinyer, Lanice Schwab, NP  ?   ? ?Allergies    ?Penicillins, Aspirin, Atorvastatin, and Diltiazem hcl   ? ?Review of Systems   ?Review of Systems  ?Respiratory:  Positive for shortness of breath.   ? ?Physical Exam ?Updated Vital Signs ?BP (!) 162/72   Pulse 68   Temp 98.7 ?F (37.1 ?C) (Oral)   Resp (!) 32   Ht _0  (1.702 m)   Wt 90 kg   SpO2 100%   BMI 31.08 kg/m?  ?Physical Exam ?Vitals and nursing note reviewed.  ?Constitutional:   ?   Appearance: She is ill-appearing.  ?Pulmonary:  ?   Effort: Tachypnea present.  ?   Breath sounds: Wheezing present.  ?Musculoskeletal:  ?   Right lower leg: No tenderness.  ?   Left lower leg: No tenderness.  ?Neurological:  ?   Mental Status: She is alert.  ? ? ?ED Results / Procedures / Treatments   ?Labs ?(all labs ordered are listed, but only abnormal results are displayed) ?Labs Reviewed  ?BRAIN NATRIURETIC PEPTIDE - Abnormal; Notable for the following components:  ?  Result Value  ? B Natriuretic Peptide 905.4 (*)   ? All other components within normal limits  ?COMPREHENSIVE METABOLIC PANEL - Abnormal; Notable for the following components:  ? Glucose, Bld 127 (*)   ? Creatinine, Ser 2.14 (*)   ? Calcium 8.5 (*)   ? Albumin 2.5 (*)   ? Alkaline Phosphatase 127 (*)   ? GFR, Estimated 22 (*)   ? All other components within normal limits  ?I-STAT VENOUS BLOOD GAS, ED - Abnormal; Notable for the following components:  ? pO2, Ven 205 (*)   ? Bicarbonate 28.3 (*)   ? Acid-Base Excess 3.0 (*)   ? Calcium, Ion 1.13 (*)   ? HCT 35.0 (*)   ? Hemoglobin 11.9 (*)   ? All other components within normal limits  ?TROPONIN I (HIGH SENSITIVITY) - Abnormal; Notable for the following components:  ? Troponin I (High  Sensitivity) 67 (*)   ? All other components within normal limits  ?TROPONIN I (HIGH SENSITIVITY) - Abnormal; Notable for the following components:  ? Troponin I (High Sensitivity) 63 (*)   ? All other components within normal limits  ?RESP PANEL BY RT-PCR (FLU A&B, COVID) [id number]  ?EXPECTORATED SPUTUM ASSESSMENT W GRAM STAIN, RFLX TO RESP C  ?RESPIRATORY PANEL BY PCR  ?CBC WITH DIFFERENTIAL/PLATELET  ?COMPREHENSIVE METABOLIC PANEL  ?CBC  ? ? ?EKG ?None ? ?Radiology ?DG Chest Port 1 View ? ?Result Date: 02/19/2022 ?CLINICAL DATA:  Shortness of breath EXAM: PORTABLE CHEST 1 VIEW COMPARISON:  07/07/2021 FINDINGS: Unchanged elevation of the left hemidiaphragm. Moderate cardiomegaly. No focal airspace consolidation or pulmonary edema. IMPRESSION: Unchanged elevation of the left hemidiaphragm. No acute airspace disease. Electronically Signed   By: Ulyses Jarred M.D.   On: 02/19/2022 19:57   ? ?Procedures ?Procedures  ? ?Medications Ordered in ED ?Medications  ?carvedilol (COREG) tablet 25 mg (25 mg Oral Given 02/19/22 2316)  ?hydrALAZINE (APRESOLINE) tablet 25 mg (25 mg Oral Given 02/19/22 2316)  ?rosuvastatin (CRESTOR) tablet 40 mg (has no administration in time range)  ?apixaban (ELIQUIS) tablet 2.5 mg (2.5 mg Oral Given 02/19/22 2351)  ?senna-docusate (Senokot-S) tablet 3 tablet (3 tablets Oral Given 02/19/22 2315)  ?ferrous sulfate tablet 325 mg (has no administration in time range)  ?albuterol (PROVENTIL) (2.5 MG/3ML) 0.083% nebulizer solution 2.5 mg (has no administration in time range)  ?albuterol (PROVENTIL) (2.5 MG/3ML) 0.083% nebulizer solution 2.5 mg (has no administration in time range)  ?budesonide (PULMICORT) nebulizer solution 0.25 mg (0.25 mg Nebulization Given 02/19/22 2340)  ?methylPREDNISolone sodium succinate (SOLU-MEDROL) 125 mg/2 mL injection 80 mg (80 mg Intravenous Given 02/19/22 2243)  ?azithromycin (ZITHROMAX) 500 mg in sodium chloride 0.9 % 250 mL IVPB (500 mg Intravenous New Bag/Given 02/19/22 2320)   ?labetalol (NORMODYNE) injection 10 mg (10 mg Intravenous Given 02/19/22 2320)  ?irbesartan (AVAPRO) tablet 300 mg (has no administration in time range)  ?furosemide (LASIX) tablet 80 mg (has no administration in time range)  ?labetalol (NORMODYNE) injection 10 mg (10 mg Intravenous Given 02/19/22 2004)  ?ipratropium-albuterol (DUONEB) 0.5-2.5 (3) MG/3ML nebulizer solution 3 mL (3 mLs Nebulization Given 02/19/22 2013)  ?magnesium sulfate IVPB 2 g 50 mL (0 g Intravenous Stopped 02/19/22 2117)  ?hydrALAZINE (APRESOLINE) injection 10 mg (10 mg Intravenous Given 02/20/22 0023)  ?furosemide (LASIX) injection 20 mg (20 mg Intravenous Given 02/20/22 0024)  ? ? ?ED Course/ Medical Decision Making/ A&P ?  ?                        ?  Medical Decision Making ?Amount and/or Complexity of Data Reviewed ?Labs: ordered. ?Radiology: ordered. ? ?Risk ?Prescription drug management. ?Decision regarding hospitalization. ? ? ?On exam, hypertensive.  Tachypneic with wheezing noted.  Given DuoNebs and magnesium.  Given labetalol for elevated blood pressures with improvement.  Headache has improved.  No focal deficits on neuro exam.  Chest x-ray on my review which is confirmed by radiology appears similar to prior with no focal pneumonia noted and unchanged elevation of left hemidiaphragm.  Initial troponin of 67 which is near baseline.  Labs remarkable for a glucose of 127, creatinine of 2.14 (increased from prior), calcium of 8.5, albumin of 2.5, alk phos of 127, GFR 22, BNP of 905.4.  COVID flu negative.  Repeat troponin 63.  Patient desaturated to 90s and was placed on 1 L of oxygen with improvement of her tachypnea and oxygen saturations. ? ?We will seek admission for significant shortness of breath and AKI.  Patient on new oxygen requirement.  Hospitalist agreed with admission at this time.  Hospitalist ordered a respiratory panel which was positive for Meta pneumo virus. ? ?Patient seen in conjunction with my attending Dr. Jeanell Sparrow who agrees with  plan. ? ? ?Final Clinical Impression(s) / ED Diagnoses ?Final diagnoses:  ?AKI (acute kidney injury) ([place])  ?SOB (shortness of breath)  ?Acute on chronic congestive heart failure, unspecified heart failure type ([place])  ? ? ?Rx / DC O

## 2022-02-19 NOTE — ED Triage Notes (Signed)
Congested x2 days. Worsening sob today with movement. CHF hx taking meds as ordered. Productive cough with yellow mucous. Yesterday low grade fever, afebrile today. ? ?238/130 initial EMS pressure --> 180 sys bp with NTG ?On eliquis. ?Hx MI. ? ?20LFA ?

## 2022-02-19 NOTE — ED Notes (Signed)
Lonell Face (Daughter)      ?[phone] please call with an update     ? ?

## 2022-02-19 NOTE — H&P (Addendum)
?History and Physical  ? ? ?[name] OZH:[id number] DOB: May 24, 1936 DOA: 02/19/2022 ? ?PCP: [name], MD  ?Patient coming from: Home. ? ?Chief Complaint: Shortness of breath. ? ?HPI: Jillian [name] is a 86 y.o. female with history of chronic combined systolic and diastolic CHF, paroxysmal atrial fibrillation, history of CVA, hypertension presents to the ER because of worsening shortness of breath with productive cough and wheezing over the last 1 week.  Patient states her grandchildren at her home has been sick for the last few days.  Patient states her symptoms initially started as upper respiratory tract symptoms and gradually started diffusely wheezing getting short of breath.  Patient has been encouraged to cough with discolored sputum greenish in color.  Denies any chest pain fever or chills. ? ?Patient has history of paroxysmal atrial fibrillation and stroke and has missed her Eliquis dosing for the last 1 week as she ran out of it. ? ?ED Course: In the ER patient is diffusely wheezing and short of breath and tachypneic.  Chest x-ray does not show anything acute.  Labs show elevated BNP of 905 high sensitive troponins were flat at 67 and 63 creatinine is slightly elevated from baseline it is around 2.1.  COVID and flu test were negative.  EKG shows no new changes when compared to the one done earlier in December 2022.  Patient admitted for acute bronchitis with possible infection. ? ?Review of Systems: As per HPI, rest all negative. ? ? ?Past Medical History:  ?Diagnosis Date  ? Acute CVA (cerebrovascular accident) ([place]) 07/12/2020  ? Hypertension   ? Renal disorder   ? CKD Stage IV  ? Stroke [facility])   ? ? ?Past Surgical History:  ?Procedure Laterality Date  ? CARDIAC CATHETERIZATION  2009  ? ? ? reports that she has never smoked. She has never used smokeless tobacco. No history on file for alcohol use and drug use. ? ?Allergies  ?Allergen Reactions  ? Penicillins Nausea Only  ? Aspirin Other (See  Comments)  ? Atorvastatin Other (See Comments)  ?  fatigue  ? Diltiazem Hcl Nausea And Vomiting  ? ? ?Family History  ?Problem Relation Age of Onset  ? CAD Mother   ? Breast cancer Mother   ? ? ?Prior to Admission medications   ?Medication Sig Start Date End Date Taking? Authorizing Provider  ?apixaban (ELIQUIS) 2.5 MG TABS tablet Take 1 tablet (2.5 mg total) by mouth 2 (two) times daily. 01/11/22   Swinyer, Lanice Schwab, NP  ?carvedilol (COREG) 25 MG tablet Take 1 tablet (25 mg total) by mouth 2 (two) times daily. 05/05/21 10/16/21  [name], MD  ?desoximetasone (TOPICORT) 0.25 % cream Apply 1 application topically 2 (two) times daily. ?Patient not taking: Reported on 11/18/2021 01/05/21   [name], MD  ?Ferrous Sulfate (IRON) 325 (65 Fe) MG TABS Take 1 tablet by mouth every other day. 07/31/21   [provider]  ?furosemide (LASIX) 20 MG tablet TAKE 3 TABLETS BY MOUTH ONCE DAILY KEEP  DECEMBER  APPT  WITH  CARDIOLOGIST  FOR  FURTHER  REFILLS 01/08/22   Fay Records, MD  ?hydrALAZINE (APRESOLINE) 25 MG tablet Take 1 tablet (25 mg total) by mouth 2 (two) times daily at 10 AM and 5 PM. 10/16/21 01/14/22  Swinyer, Lanice Schwab, NP  ?rosuvastatin (CRESTOR) 40 MG tablet Take 1 tablet (40 mg total) by mouth daily. 11/20/21 02/18/22  Swinyer, Lanice Schwab, NP  ?sennosides-docusate sodium (SENOKOT-S) 8.6-50 MG tablet Take  3 tablets by mouth at bedtime. 10/01/21   [name], MD  ?telmisartan (MICARDIS) 80 MG tablet Take 1 tablet (80 mg total) by mouth daily. 11/18/21   Swinyer, Lanice Schwab, NP  ? ? ?Physical Exam: ?Constitutional: Moderately built and nourished. ?Vitals:  ? 02/19/22 1929 02/19/22 1930 02/19/22 2015  ?BP:  (!) 235/92 (!) 168/75  ?Pulse:  89 71  ?Resp:  (!) 24 (!) 24  ?Temp:  98.7 ?F (37.1 ?C)   ?TempSrc:  Oral   ?SpO2:  96% 96%  ?Weight: 90 kg    ?Height: 5\' 7"  (1.702 m)    ? ?Eyes: Anicteric no pallor. ?ENMT: No discharge from the ears eyes nose and mouth. ?Neck: No mass felt.  No neck rigidity.   No JVD appreciated. ?Respiratory: Bilateral expiratory wheeze and no crepitations. ?Cardiovascular: S1-S2 heard. ?Abdomen: Soft nontender bowel sound present. ?Musculoskeletal: No edema. ?Skin: No rash. ?Neurologic: Alert awake oriented to time place and person.  Moves all extremities. ?Psychiatric: Appears normal.  Normal affect. ? ? ?Labs on Admission: I have personally reviewed following labs and imaging studies ? ?CBC: ?Recent Labs  ?Lab 02/19/22 ?1944 02/19/22 ?2030  ?WBC 7.5  --   ?NEUTROABS 4.4  --   ?HGB 12.1 11.9*  ?HCT 40.0 35.0*  ?MCV 89.5  --   ?PLT 151  --   ? ?Basic Metabolic Panel: ?Recent Labs  ?Lab 02/19/22 ?1944 02/19/22 ?2030  ?NA 138 140  ?K 4.1 4.1  ?CL 108  --   ?CO2 24  --   ?GLUCOSE 127*  --   ?BUN 23  --   ?CREATININE 2.14*  --   ?CALCIUM 8.5*  --   ? ?GFR: ?Estimated Creatinine Clearance: 22.6 mL/min (A) (by C-G formula based on SCr of 2.14 mg/dL (H)). ?Liver Function Tests: ?Recent Labs  ?Lab 02/19/22 ?1944  ?AST 22  ?ALT 13  ?ALKPHOS 127*  ?BILITOT 0.6  ?PROT 7.3  ?ALBUMIN 2.5*  ? ?No results for input(s): LIPASE, AMYLASE in the last 168 hours. ?No results for input(s): AMMONIA in the last 168 hours. ?Coagulation Profile: ?No results for input(s): INR, PROTIME in the last 168 hours. ?Cardiac Enzymes: ?No results for input(s): CKTOTAL, CKMB, CKMBINDEX, TROPONINI in the last 168 hours. ?BNP (last 3 results) ?No results for input(s): PROBNP in the last 8760 hours. ?HbA1C: ?No results for input(s): HGBA1C in the last 72 hours. ?CBG: ?No results for input(s): GLUCAP in the last 168 hours. ?Lipid Profile: ?No results for input(s): CHOL, HDL, LDLCALC, TRIG, CHOLHDL, LDLDIRECT in the last 72 hours. ?Thyroid Function Tests: ?No results for input(s): TSH, T4TOTAL, FREET4, T3FREE, THYROIDAB in the last 72 hours. ?Anemia Panel: ?No results for input(s): VITAMINB12, FOLATE, FERRITIN, TIBC, IRON, RETICCTPCT in the last 72 hours. ?Urine analysis: ?   ?Component Value Date/Time  ? COLORURINE COLORLESS  (A) 07/09/2020 6767  ? APPEARANCEUR CLEAR 07/09/2020 0634  ? LABSPEC 1.004 (L) 07/09/2020 2094  ? PHURINE 6.0 07/09/2020 0634  ? GLUCOSEU NEGATIVE 07/09/2020 0634  ? [place] NEGATIVE 07/09/2020 0634  ? [place] NEGATIVE 07/09/2020 0634  ? [place] NEGATIVE 07/09/2020 0634  ? PROTEINUR 30 (A) 07/09/2020 7096  ? NITRITE NEGATIVE 07/09/2020 0634  ? LEUKOCYTESUR NEGATIVE 07/09/2020 0634  ? ?Sepsis Labs: ?@LABRCNTIP (procalcitonin:4,lacticidven:4) ?) ?Recent Results (from the past 240 hour(s))  ?Resp Panel by RT-PCR (Flu A&B, Covid) Nasopharyngeal Swab     Status: None  ? Collection Time: 02/19/22  8:06 PM  ? Specimen: Nasopharyngeal Swab; Nasopharyngeal(NP) swabs in vial transport medium  ?Result  Value Ref Range Status  ? SARS Coronavirus 2 by RT PCR NEGATIVE NEGATIVE Final  ?  Comment: (NOTE) ?SARS-CoV-2 target nucleic acids are NOT DETECTED. ? ?The SARS-CoV-2 RNA is generally detectable in upper respiratory ?specimens during the acute phase of infection. The lowest ?concentration of SARS-CoV-2 viral copies this assay can detect is ?138 copies/mL. A negative result does not preclude SARS-Cov-2 ?infection and should not be used as the sole basis for treatment or ?other patient management decisions. A negative result may occur with  ?improper specimen collection/handling, submission of specimen other ?than nasopharyngeal swab, presence of viral mutation(s) within the ?areas targeted by this assay, and inadequate number of viral ?copies(<138 copies/mL). A negative result must be combined with ?clinical observations, patient history, and epidemiological ?information. The expected result is Negative. ? ?Fact Sheet for Patients:  ?EntrepreneurPulse.com.au ? ?Fact Sheet for Healthcare Providers:  ?IncredibleEmployment.be ? ?This test is no t yet approved or cleared by the [place] FDA and  ?has been authorized for detection and/or diagnosis of SARS-CoV-2 by ?FDA under an Emergency Use  Authorization (EUA). This EUA will remain  ?in effect (meaning this test can be used) for the duration of the ?COVID-19 declaration under Section 564(b)(1) of the Act, 21 ?U.S.C.section 360bbb-3(b)(1), unl

## 2022-02-20 DIAGNOSIS — Z888 Allergy status to other drugs, medicaments and biological substances status: Secondary | ICD-10-CM | POA: Diagnosis not present

## 2022-02-20 DIAGNOSIS — J209 Acute bronchitis, unspecified: Secondary | ICD-10-CM | POA: Diagnosis not present

## 2022-02-20 DIAGNOSIS — N184 Chronic kidney disease, stage 4 (severe): Secondary | ICD-10-CM | POA: Diagnosis present

## 2022-02-20 DIAGNOSIS — Z79899 Other long term (current) drug therapy: Secondary | ICD-10-CM | POA: Diagnosis not present

## 2022-02-20 DIAGNOSIS — E785 Hyperlipidemia, unspecified: Secondary | ICD-10-CM | POA: Diagnosis present

## 2022-02-20 DIAGNOSIS — Z880 Allergy status to penicillin: Secondary | ICD-10-CM | POA: Diagnosis not present

## 2022-02-20 DIAGNOSIS — I480 Paroxysmal atrial fibrillation: Secondary | ICD-10-CM | POA: Diagnosis present

## 2022-02-20 DIAGNOSIS — R0602 Shortness of breath: Secondary | ICD-10-CM | POA: Diagnosis present

## 2022-02-20 DIAGNOSIS — Z20822 Contact with and (suspected) exposure to covid-19: Secondary | ICD-10-CM | POA: Diagnosis present

## 2022-02-20 DIAGNOSIS — I5043 Acute on chronic combined systolic (congestive) and diastolic (congestive) heart failure: Secondary | ICD-10-CM | POA: Diagnosis present

## 2022-02-20 DIAGNOSIS — D631 Anemia in chronic kidney disease: Secondary | ICD-10-CM | POA: Diagnosis present

## 2022-02-20 DIAGNOSIS — Z803 Family history of malignant neoplasm of breast: Secondary | ICD-10-CM | POA: Diagnosis not present

## 2022-02-20 DIAGNOSIS — Z8673 Personal history of transient ischemic attack (TIA), and cerebral infarction without residual deficits: Secondary | ICD-10-CM | POA: Diagnosis not present

## 2022-02-20 DIAGNOSIS — Z8249 Family history of ischemic heart disease and other diseases of the circulatory system: Secondary | ICD-10-CM | POA: Diagnosis not present

## 2022-02-20 DIAGNOSIS — I130 Hypertensive heart and chronic kidney disease with heart failure and stage 1 through stage 4 chronic kidney disease, or unspecified chronic kidney disease: Secondary | ICD-10-CM | POA: Diagnosis present

## 2022-02-20 DIAGNOSIS — Z886 Allergy status to analgesic agent status: Secondary | ICD-10-CM | POA: Diagnosis not present

## 2022-02-20 DIAGNOSIS — I509 Heart failure, unspecified: Secondary | ICD-10-CM | POA: Diagnosis not present

## 2022-02-20 DIAGNOSIS — I160 Hypertensive urgency: Secondary | ICD-10-CM | POA: Diagnosis present

## 2022-02-20 DIAGNOSIS — N179 Acute kidney failure, unspecified: Secondary | ICD-10-CM | POA: Diagnosis present

## 2022-02-20 DIAGNOSIS — Z7901 Long term (current) use of anticoagulants: Secondary | ICD-10-CM | POA: Diagnosis not present

## 2022-02-20 LAB — RESPIRATORY PANEL BY PCR

## 2022-02-20 LAB — COMPREHENSIVE METABOLIC PANEL
ALT: 14 U/L (ref 0–44)
AST: 21 U/L (ref 15–41)
Albumin: 2.5 g/dL — ABNORMAL LOW (ref 3.5–5.0)
Alkaline Phosphatase: 122 U/L (ref 38–126)
Anion gap: 8 (ref 5–15)
BUN: 24 mg/dL — ABNORMAL HIGH (ref 8–23)
CO2: 24 mmol/L (ref 22–32)
Calcium: 8.6 mg/dL — ABNORMAL LOW (ref 8.9–10.3)
Chloride: 106 mmol/L (ref 98–111)
Creatinine, Ser: 1.93 mg/dL — ABNORMAL HIGH (ref 0.44–1.00)
GFR, Estimated: 25 mL/min — ABNORMAL LOW (ref >60)
Glucose, Bld: 123 mg/dL — ABNORMAL HIGH (ref 70–99)
Potassium: 4.1 mmol/L (ref 3.5–5.1)
Sodium: 138 mmol/L (ref 135–145)
Total Bilirubin: 0.9 mg/dL (ref 0.3–1.2)
Total Protein: 7.4 g/dL (ref 6.5–8.1)

## 2022-02-20 LAB — CBC
HCT: 37.1 % (ref 36.0–46.0)
Hemoglobin: 11.5 g/dL — ABNORMAL LOW (ref 12.0–15.0)
MCH: 27.5 pg (ref 26.0–34.0)
MCHC: 31 g/dL (ref 30.0–36.0)
MCV: 88.8 fL (ref 80.0–100.0)
Platelets: 158 10*3/uL (ref 150–400)
RBC: 4.18 MIL/uL (ref 3.87–5.11)
RDW: 13.6 % (ref 11.5–15.5)
WBC: 8.2 10*3/uL (ref 4.0–10.5)
nRBC: 0 % (ref 0.0–0.2)

## 2022-02-20 MED ORDER — LEVOFLOXACIN 250 MG PO TABS
250.0000 mg | ORAL_TABLET | ORAL | Status: DC
  Administered 2022-02-20: 250 mg via ORAL
  Filled 2022-02-20 (×2): qty 1

## 2022-02-20 MED ORDER — ISOSORB DINITRATE-HYDRALAZINE 20-37.5 MG PO TABS
1.0000 | ORAL_TABLET | Freq: Three times a day (TID) | ORAL | Status: DC
  Administered 2022-02-20 – 2022-02-23 (×9): 1 via ORAL
  Filled 2022-02-20 (×11): qty 1

## 2022-02-20 MED ORDER — FUROSEMIDE 10 MG/ML IJ SOLN
40.0000 mg | Freq: Once | INTRAMUSCULAR | Status: AC
  Administered 2022-02-20: 40 mg via INTRAVENOUS
  Filled 2022-02-20: qty 4

## 2022-02-20 MED ORDER — FUROSEMIDE 20 MG PO TABS: 80.0000 mg | ORAL_TABLET | Freq: Every day | ORAL | Status: DC

## 2022-02-20 MED ORDER — FUROSEMIDE 10 MG/ML IJ SOLN: 40.0000 mg | Freq: Every day | INTRAMUSCULAR | Status: DC

## 2022-02-20 MED ORDER — ISOSORB DINITRATE-HYDRALAZINE 20-37.5 MG PO TABS: 1.0000 | ORAL_TABLET | Freq: Three times a day (TID) | ORAL | Status: DC

## 2022-02-20 MED ORDER — METHYLPREDNISOLONE SODIUM SUCC 40 MG IJ SOLR
40.0000 mg | Freq: Two times a day (BID) | INTRAMUSCULAR | Status: DC
Start: 2022-02-20 — End: 2022-02-23
  Administered 2022-02-20 – 2022-02-22 (×6): 40 mg via INTRAVENOUS
  Filled 2022-02-20 (×6): qty 1

## 2022-02-20 MED ORDER — ISOSORB DINITRATE-HYDRALAZINE 20-37.5 MG PO TABS
2.0000 | ORAL_TABLET | Freq: Three times a day (TID) | ORAL | Status: DC
  Administered 2022-02-20: 2 via ORAL
  Filled 2022-02-20 (×3): qty 2

## 2022-02-20 NOTE — ED Notes (Signed)
Pt reports no O2 usage at home, RN trials room air, pt at 95% on RA. Pt with wet productive cough. Pt sitting up in stretcher eating breakfast, call light in reach, denies further needs. ?

## 2022-02-20 NOTE — Evaluation (Signed)
Physical Therapy Evaluation ?Patient Details ?Name: Jillian [name] ?MRN: [id number] ?DOB: 1936/02/20 ?Today's Date: 02/20/2022 ? ?History of Present Illness ? 86 y.o. female presents to [facility] hospital on 02/19/2022 with SOB, cough and wheezing for 1 week. Pt admitted for management of acute bronchitis and hypertensive urgency. PMH includes CHF, PAF, CVA, HTN.  ?Clinical Impression ? Pt presents to PT with deficits in activity tolerance, strength, power, gait. Pt reports LE weakness at this time, desiring to utilize RW for support and stability when mobilizing this session. Pt is able to ambulate with use of walker for limited community distances. Pt will benefit from continued aggressive mobilization in an effort to restore independence.   ?   ? ?Recommendations for follow up therapy are one component of a multi-disciplinary discharge planning process, led by the attending physician.  Recommendations may be updated based on patient status, additional functional criteria and insurance authorization. ? ?Follow Up Recommendations Outpatient PT ? ?  ?Assistance Recommended at Discharge PRN  ?Patient can return home with the following ? A little help with walking and/or transfers;Help with stairs or ramp for entrance;Assistance with cooking/housework ? ?  ?Equipment Recommendations None recommended by PT (pt owns necessary DME)  ?Recommendations for Other Services ?    ?  ?Functional Status Assessment Patient has had a recent decline in their functional status and demonstrates the ability to make significant improvements in function in a reasonable and predictable amount of time.  ? ?  ?Precautions / Restrictions Precautions ?Precautions: Fall ?Restrictions ?Weight Bearing Restrictions: No  ? ?  ? ?Mobility ? Bed Mobility ?Overal bed mobility: Needs Assistance ?Bed Mobility: Supine to Sit ?  ?  ?Supine to sit: Supervision, HOB elevated ?  ?  ?General bed mobility comments: increased time, use of rails ?  ? ?Transfers ?Overall  transfer level: Needs assistance ?Equipment used: Rolling walker (2 wheels) ?Transfers: Sit to/from Stand ?Sit to Stand: Min guard ?  ?  ?  ?  ?  ?  ?  ? ?Ambulation/Gait ?Ambulation/Gait assistance: Supervision ?Gait Distance (Feet): 200 Feet ?Assistive device: Rolling walker (2 wheels) ?Gait Pattern/deviations: Step-through pattern ?Gait velocity: functional ?Gait velocity interpretation: 1.31 - 2.62 ft/sec, indicative of limited community ambulator ?  ?General Gait Details: pt with slowed step-through gait, mild increase in trunk flexion at times ? ?Stairs ?  ?  ?  ?  ?  ? ?Wheelchair Mobility ?  ? ?Modified Rankin (Stroke Patients Only) ?  ? ?  ? ?Balance Overall balance assessment: Needs assistance ?Sitting-balance support: Feet supported, No upper extremity supported ?Sitting balance-Leahy Scale: Good ?  ?  ?Standing balance support: Single extremity supported, Reliant on assistive device for balance ?Standing balance-Leahy Scale: Poor ?  ?  ?  ?  ?  ?  ?  ?  ?  ?  ?  ?  ?   ? ? ? ?Pertinent Vitals/Pain Pain Assessment ?Pain Assessment: No/denies pain  ? ? ?Home Living Family/patient expects to be discharged to:: Private residence ?Living Arrangements: Other relatives (23 y.o. grandson) ?Available Help at Discharge: Family;Available PRN/intermittently (granddaughter) ?Type of Home: House ?Home Access: Stairs to enter ?  ?Entrance Stairs-Number of Steps: 1 ?  ?Home Layout: One level ?Home Equipment: Rollator (4 wheels);Shower seat;BSC/3in1 ?   ?  ?Prior Function Prior Level of Function : Independent/Modified Independent ?  ?  ?  ?  ?  ?  ?  ?  ?  ? ? ?Hand Dominance  ? Dominant Hand: Right ? ?  ?  Extremity/Trunk Assessment  ? Upper Extremity Assessment ?Upper Extremity Assessment: Overall WFL for tasks assessed ?  ? ?Lower Extremity Assessment ?Lower Extremity Assessment: Generalized weakness ?  ? ?Cervical / Trunk Assessment ?Cervical / Trunk Assessment: Kyphotic  ?Communication  ? Communication: No difficulties   ?Cognition Arousal/Alertness: Awake/alert ?Behavior During Therapy: [facility] for tasks assessed/performed ?Overall Cognitive Status: Within Functional Limits for tasks assessed ?  ?  ?  ?  ?  ?  ?  ?  ?  ?  ?  ?  ?  ?  ?  ?  ?  ?  ?  ? ?  ?General Comments General comments (skin integrity, edema, etc.): VSS on RA, SpO2 in mid to high 90s ? ?  ?Exercises    ? ?Assessment/Plan  ?  ?PT Assessment Patient needs continued PT services  ?PT Problem List Decreased strength;Decreased activity tolerance;Decreased balance;Decreased mobility ? ?   ?  ?PT Treatment Interventions DME instruction;Gait training;Stair training;Functional mobility training;Therapeutic activities;Therapeutic exercise;Balance training;Neuromuscular re-education;Patient/family education   ? ?PT Goals (Current goals can be found in the Care Plan section)  ?Acute Rehab PT Goals ?Patient Stated Goal: to return home ?PT Goal Formulation: With patient ?Time For Goal Achievement: 03/06/22 ?Potential to Achieve Goals: Good ? ?  ?Frequency Min 3X/week ?  ? ? ?Co-evaluation   ?  ?  ?  ?  ? ? ?  ?AM-PAC PT "6 Clicks" Mobility  ?Outcome Measure Help needed turning from your back to your side while in a flat bed without using bedrails?: A Little ?Help needed moving from lying on your back to sitting on the side of a flat bed without using bedrails?: A Little ?Help needed moving to and from a bed to a chair (including a wheelchair)?: A Little ?Help needed standing up from a chair using your arms (e.g., wheelchair or bedside chair)?: A Little ?Help needed to walk in hospital room?: A Little ?Help needed climbing 3-5 steps with a railing? : Total ?6 Click Score: 16 ? ?  ?End of Session   ?Activity Tolerance: Patient tolerated treatment well ?Patient left: in chair;with call bell/phone within reach;with chair alarm set ?Nurse Communication: Mobility status ?PT Visit Diagnosis: Other abnormalities of gait and mobility (R26.89);Muscle weakness (generalized) (M62.81) ?   ? ?Time: 3300-7622 ?PT Time Calculation (min) (ACUTE ONLY): 31 min ? ? ?Charges:   PT Evaluation ?$PT Eval Low Complexity: 1 Low ?  ?  ?   ? ? ?Zenaida Niece, PT, DPT ?Acute Rehabilitation ?Pager: [phone] ?Office [phone] ? ? ?Zenaida Niece ?02/20/2022, 3:40 PM ? ?

## 2022-02-20 NOTE — ED Notes (Signed)
Breakfast order placed ?

## 2022-02-20 NOTE — ED Notes (Signed)
Pt up to [facility]. Occupational therapy arrives to assess, assists pt to recliner. RN provided pt with incentive spirometer, pt demonstrated successful return demo. ?

## 2022-02-20 NOTE — Evaluation (Addendum)
Occupational Therapy Evaluation ?Patient Details ?Name: Jillian [name] ?MRN: [id number] ?DOB: 04-24-1936 ?Today's Date: 02/20/2022 ? ? ?History of Present Illness 86 y.o. female presents to [facility] hospital on 02/19/2022 with SOB, cough and wheezing for 1 week. Pt admitted for management of acute bronchitis and hypertensive urgency. PMH includes CHF, PAF, CVA, HTN.  ? ?Clinical Impression ?  ?PTA, pt was independent and living with her 84 year old grandson who she is raising. Currently, pt requires Supervision-Min Guard A for ADLs and Min Guard-Min A functional mobility without DME. Pt presenting with slight fatigue during activity but able to complete toileting, hand hygiene, and functional mobility in room. SpO2 90s on RA and HR 80-90s. Pt would benefit from further acute OT to facilitate safe dc. Recommend dc to home with HHOT for further OT to optimize safety, independence with ADLs, and return to PLOF.   ?   ? ?Recommendations for follow up therapy are one component of a multi-disciplinary discharge planning process, led by the attending physician.  Recommendations may be updated based on patient status, additional functional criteria and insurance authorization.  ? ?Follow Up Recommendations ? Home health OT (Pneding progress, may not need)  ?  ?Assistance Recommended at Discharge Intermittent Supervision/Assistance  ?Patient can return home with the following   ? ?  ?Functional Status Assessment ?    ?Equipment Recommendations ? None recommended by OT  ?  ?Recommendations for Other Services   ? ? ?  ?Precautions / Restrictions Precautions ?Precautions: Fall ?Restrictions ?Weight Bearing Restrictions: No  ? ?  ? ?Mobility Bed Mobility ?  ?  ?  ?  ?  ?  ?  ?General bed mobility comments: Sitting at [facility] upon arrival ?  ? ?Transfers ?Overall transfer level: Needs assistance ?Equipment used: None ?Transfers: Sit to/from Stand ?Sit to Stand: Min guard ?  ?  ?  ?  ?  ?General transfer comment: Min Guard A ?  ? ?  ?Balance  Overall balance assessment: Mild deficits observed, not formally tested ?  ?  ?  ?  ?  ?  ?  ?  ?  ?  ?  ?  ?  ?  ?  ?  ?  ?  ?   ? ?ADL either performed or assessed with clinical judgement  ? ?ADL Overall ADL's : Needs assistance/impaired ?Eating/Feeding: Set up;Sitting ?  ?Grooming: Wash/dry hands;Supervision/safety;Standing ?  ?Upper Body Bathing: Supervision/ safety;Set up;Sitting ?  ?Lower Body Bathing: Min guard;Sit to/from stand ?  ?Upper Body Dressing : Supervision/safety;Set up;Sitting ?  ?Lower Body Dressing: Min guard;Sit to/from stand ?  ?Toilet Transfer: Min guard;Ambulation;BSC/3in1 ?  ?Toileting- Clothing Manipulation and Hygiene: Supervision/safety;Sitting/lateral lean ?  ?  ?  ?Functional mobility during ADLs: Min guard;Minimal assistance ?General ADL Comments: Pt performing ADLs and functional mobility at Supervision-Min A level. Pt presenting with slight fatigue. Able to complete toileting and hand hygiene. After ADLs, pt making several laps in the room with MIn Guard-Min A stating "im exercising"  ? ? ? ?Vision   ?   ?   ?Perception   ?  ?Praxis   ?  ? ?Pertinent Vitals/Pain Pain Assessment ?Pain Assessment: No/denies pain  ? ? ? ?Hand Dominance Right ?  ?Extremity/Trunk Assessment Upper Extremity Assessment ?Upper Extremity Assessment: Overall WFL for tasks assessed ?  ?Lower Extremity Assessment ?Lower Extremity Assessment: Defer to PT evaluation ?  ?  ?  ?Communication Communication ?Communication: No difficulties ?  ?Cognition Arousal/Alertness: Awake/alert ?Behavior During Therapy: [facility] for  tasks assessed/performed ?Overall Cognitive Status: Within Functional Limits for tasks assessed ?  ?  ?  ?  ?  ?  ?  ?  ?  ?  ?  ?  ?  ?  ?  ?  ?General Comments: Decreased awareness and problem solving but feel this is baseline cognition ?  ?  ?General Comments  HR 90s and SpO2 90s on RA ? ?  ?Exercises   ?  ?Shoulder Instructions    ? ? ?Home Living Family/patient expects to be discharged to:: Private  residence ?Living Arrangements: Other relatives (45 yo grandson who she is raising) ?Available Help at Discharge: Family;Available PRN/intermittently (Daughter visits often. per pt, "in and out everyday. might as well live there") ?Type of Home: House ?Home Access: Level entry ?  ?  ?Home Layout: One level ?  ?  ?Bathroom Shower/Tub: Tub/shower unit ?  ?Bathroom Toilet: Handicapped height ?  ?  ?Home Equipment: Shower Land (2 wheels);BSC/3in1 ?  ?  ?  ? ?  ?Prior Functioning/Environment Prior Level of Function : Independent/Modified Independent ?  ?  ?  ?  ?  ?  ?Mobility Comments: Does not use DME. "I have all those things but done use them" ?ADLs Comments: Performs ADLs and IADLs. Care for her 41 year old grandson ?  ? ?  ?  ?OT Problem List: Decreased strength;Decreased range of motion;Decreased activity tolerance;Impaired balance (sitting and/or standing) ?  ?   ?OT Treatment/Interventions: Self-care/ADL training;Therapeutic exercise;Energy conservation;DME and/or AE instruction  ?  ?OT Goals(Current goals can be found in the care plan section) Acute Rehab OT Goals ?Patient Stated Goal: Go home ?OT Goal Formulation: With patient ?Time For Goal Achievement: 03/06/22 ?Potential to Achieve Goals: Good  ?OT Frequency: Min 2X/week ?  ? ?Co-evaluation   ?  ?  ?  ?  ? ?  ?AM-PAC OT "6 Clicks" Daily Activity     ?Outcome Measure Help from another person eating meals?: None ?Help from another person taking care of personal grooming?: A Little ?Help from another person toileting, which includes using toliet, bedpan, or urinal?: A Little ?Help from another person bathing (including washing, rinsing, drying)?: A Little ?Help from another person to put on and taking off regular upper body clothing?: A Little ?Help from another person to put on and taking off regular lower body clothing?: A Little ?6 Click Score: 19 ?  ?End of Session Nurse Communication: Mobility status ? ?Activity Tolerance: Patient  tolerated treatment well ?Patient left: in chair;with call bell/phone within reach ? ?OT Visit Diagnosis: Unsteadiness on feet (R26.81);Other abnormalities of gait and mobility (R26.89);Muscle weakness (generalized) (M62.81)  ?              ?Time: 9702-6378 ?OT Time Calculation (min): 22 min ?Charges:  OT General Charges ?$OT Visit: 1 Visit ?OT Evaluation ?$OT Eval Moderate Complexity: 1 Mod ? ?Jamyson Jirak MSOT, OTR/L ?Acute Rehab ?Pager: [phone] ?Office: [phone] ? ?Layla Gramm M Voula Waln ?02/20/2022, 9:56 AM ?

## 2022-02-20 NOTE — Progress Notes (Signed)
?                                  PROGRESS NOTE                                             ?                                                                                                                     ?                                         ? ? Patient Demographics:  ? ? Jillian [name], is a 86 y.o. female, DOB - 06/24/1936, GHW:[id number] ? ?Outpatient Primary MD for the patient is [name], MD    LOS - 0  Admit date - 02/19/2022   ? ?Chief Complaint  ?Patient presents with  ? Shortness of Breath  ?    ? ?Brief Narrative (HPI from H&P) - 86 y.o. female with history of chronic combined systolic and diastolic CHF, paroxysmal atrial fibrillation, history of CVA, hypertension presents to the ER because of worsening shortness of breath with productive cough and wheezing over the last 1 week, she had some exposure to sick contacts in the last week.  In the ER her work-up was suggestive of acute bronchitis and acute on chronic diastolic CHF and she was admitted. ? ? Subjective:  ? ? Jillian [name] today has, No headache, No chest pain, No abdominal pain - No Nausea, No new weakness tingling or numbness, positive productive cough and some exertional shortness of breath. ? ? Assessment  & Plan :  ? ? ?Hypoxic respiratory failure requiring supplemental oxygen due to combination of acute bronchitis and acute on chronic diastolic CHF. She is currently admitted to the hospital, sputum Gram stain culture has been ordered, will trend procalcitonin levels as well, chest x-ray noted.  She will be given 3 doses of antibiotics, IV Lasix, flutter valve and I-S for pulmonary toiletry, supplemental oxygen. ? ?2.  Acute on chronic diastolic CHF EF 78% in 9381.  Currently on IV Lasix, continue Coreg, on ARB which I will switch to BiDil as she is being diuresed and will try to avoid AKI in the setting of underlying CKD. ? ?3.  Stage IV.  Monitor creatinine baseline close to 2.  Holding ARB  and switching it with BiDil for now ? ?4.  History of CVA.  Continue combination of statin and Eliquis for secondary prevention. ? ?5.  Hypertensive urgency could have been due to shortness of breath, for now Coreg, BiDil and as needed hydralazine. ? ?6.  History of paroxysmal atrial fibrillation with [place] vas 2 score  of greater than 3.  On Coreg and Eliquis.  Will require Eliquis prescription upon discharge. ? ?7.  Nonspecific chronic EKG changes.  Monitor clinically. ? ?   ? ?Condition - Fair ? ?Family Communication  :  None present ? ?Code Status :  Full ? ?Consults  :  None ? ?PUD Prophylaxis : None ? ? Procedures  :    ? ?  ? ?   ? ?Disposition Plan  :   ? ?Status is: Observation ? ?DVT Prophylaxis  :   ? ?apixaban (ELIQUIS) tablet 2.5 mg Start: 02/19/22 2300 ?apixaban (ELIQUIS) tablet 2.5 mg  ?  ? ?Lab Results  ?Component Value Date  ? PLT 158 02/20/2022  ? ? ?Diet :  ?Diet Order   ? ?       ?  Diet Heart Room service appropriate? Yes; Fluid consistency: Thin; Fluid restriction: 1200 mL Fluid  Diet effective now       ?  ? ?  ?  ? ?  ?  ? ?Inpatient Medications ? ?Scheduled Meds: ? albuterol  2.5 mg Nebulization Q4H  ? apixaban  2.5 mg Oral BID  ? budesonide (PULMICORT) nebulizer solution  0.25 mg Nebulization BID  ? carvedilol  25 mg Oral BID WC  ? ferrous sulfate  325 mg Oral QODAY  ? furosemide  40 mg Intravenous Once  ? [START ON 02/23/2022] furosemide  80 mg Oral Daily  ? isosorbide-hydrALAZINE  2 tablet Oral TID  ? levofloxacin  500 mg Oral Q48H  ? methylPREDNISolone (SOLU-MEDROL) injection  40 mg Intravenous Q12H  ? rosuvastatin  40 mg Oral Daily  ? senna-docusate  3 tablet Oral QHS  ? ?Continuous Infusions: ?PRN Meds:.albuterol, labetalol ? ?Antibiotics  :   ? ?Anti-infectives (From admission, onward)  ? ? Start     Dose/Rate Route Frequency Ordered Stop  ? 02/20/22 0845  levofloxacin (LEVAQUIN) tablet 500 mg       ?Note to Pharmacy: Pharmacy can adjust for severe bronchitis  ? 500 mg Oral Every 48  hours 02/20/22 0844 02/26/22 0844  ? 02/19/22 2215  azithromycin (ZITHROMAX) 500 mg in sodium chloride 0.9 % 250 mL IVPB  Status:  Discontinued       ? 500 mg ?250 mL/hr over 60 Minutes Intravenous Every 24 hours 02/19/22 2212 02/20/22 0844  ? ?  ? ? ? Time Spent in minutes  30 ? ? ?Lala Lund M.D on 02/20/2022 at 8:51 AM ? ?To page go to [email or web address]  ? ?[facility] -  Office  [phone] ? ?See all Orders from today for further details ? ? ? Objective:  ? ?Vitals:  ? 02/20/22 0630 02/20/22 0730 02/20/22 0800 02/20/22 0826  ?BP: 133/64 (!) 163/72 (!) 162/71   ?Pulse: 65 63 67   ?Resp: 20 (!) 21 (!) 24   ?Temp:      ?TempSrc:      ?SpO2: 99% 99% 100% (S) 95%  ?Weight:      ?Height:      ? ? ?Wt Readings from Last 3 Encounters:  ?02/19/22 90 kg  ?11/18/21 83.9 kg  ?10/16/21 84.5 kg  ? ? ? ?Intake/Output Summary (Last 24 hours) at 02/20/2022 0851 ?Last data filed at 02/20/2022 6314 ?Gross per 24 hour  ?Intake 0.5 ml  ?Output 800 ml  ?Net -799.5 ml  ? ? ? ?Physical Exam ? ?Awake Alert, No new F.N deficits, Normal affect ?[place].AT,PERRAL ?Supple Neck, No JVD,   ?Symmetrical Chest wall movement, Good air  movement bilaterally, coarse bilateral breath sounds ?RRR,No Gallops,Rubs or new Murmurs,  ?+ve B.Sounds, Abd Soft, No tenderness,   ?No Cyanosis, Clubbing or edema  ?  ? ? Data Review:  ? ? ?CBC ?Recent Labs  ?Lab 02/19/22 ?1944 02/19/22 ?2030 02/20/22 ?0238  ?WBC 7.5  --  8.2  ?HGB 12.1 11.9* 11.5*  ?HCT 40.0 35.0* 37.1  ?PLT 151  --  158  ?MCV 89.5  --  88.8  ?MCH 27.1  --  27.5  ?MCHC 30.3  --  31.0  ?RDW 13.6  --  13.6  ?LYMPHSABS 2.0  --   --   ?MONOABS 1.0  --   --   ?EOSABS 0.1  --   --   ?BASOSABS 0.0  --   --   ? ? ?Electrolytes ?Recent Labs  ?Lab 02/19/22 ?1944 02/19/22 ?2030 02/20/22 ?0238  ?NA 138 140 138  ?K 4.1 4.1 4.1  ?CL 108  --  106  ?CO2 24  --  24  ?GLUCOSE 127*  --  123*  ?BUN 23  --  24*  ?CREATININE 2.14*  --  1.93*  ?CALCIUM 8.5*  --  8.6*  ?AST 22  --  21  ?ALT 13  --  14  ?ALKPHOS 127*  --   122  ?BILITOT 0.6  --  0.9  ?ALBUMIN 2.5*  --  2.5*  ?BNP 905.4*  --   --   ? ? ?------------------------------------------------------------------------------------------------------------------ ?No results for input(s): CHOL, HDL, LDLCALC, TRIG, CHOLHDL, LDLDIRECT in the last 72 hours. ? ?Lab Results  ?Component Value Date  ? HGBA1C 6.2 (H) 07/08/2021  ? ?Radiology Reports ?DG Chest Port 1 View ? ?Result Date: 02/19/2022 ?CLINICAL DATA:  Shortness of breath EXAM: PORTABLE CHEST 1 VIEW COMPARISON:  07/07/2021 FINDINGS: Unchanged elevation of the left hemidiaphragm. Moderate cardiomegaly. No focal airspace consolidation or pulmonary edema. IMPRESSION: Unchanged elevation of the left hemidiaphragm. No acute airspace disease. Electronically Signed   By: Ulyses Jarred M.D.   On: 02/19/2022 19:57    ? ? ?

## 2022-02-20 NOTE — Plan of Care (Signed)
?  Problem: Clinical Measurements: ?Goal: Ability to maintain clinical measurements within normal limits will improve ?Outcome: Progressing ?Goal: Will remain free from infection ?Outcome: Progressing ?Goal: Diagnostic test results will improve ?Outcome: Progressing ?Goal: Respiratory complications will improve ?Outcome: Progressing ?Goal: Cardiovascular complication will be avoided ?Outcome: Progressing ?  ?Problem: Elimination: ?Goal: Will not experience complications related to bowel motility ?Outcome: Progressing ?Goal: Will not experience complications related to urinary retention ?Outcome: Progressing ?  ?Problem: Pain Managment: ?Goal: General experience of comfort will improve ?Outcome: Progressing ?  ?Problem: Skin Integrity: ?Goal: Risk for impaired skin integrity will decrease ?Outcome: Progressing ?  ?Problem: Safety: ?Goal: Ability to remain free from injury will improve ?Outcome: Progressing ?  ?

## 2022-02-21 ENCOUNTER — Inpatient Hospital Stay (HOSPITAL_COMMUNITY): Payer: Medicare Other

## 2022-02-21 DIAGNOSIS — J209 Acute bronchitis, unspecified: Secondary | ICD-10-CM | POA: Diagnosis not present

## 2022-02-21 LAB — COMPREHENSIVE METABOLIC PANEL
ALT: 12 U/L (ref 0–44)
AST: 20 U/L (ref 15–41)
Albumin: 2.3 g/dL — ABNORMAL LOW (ref 3.5–5.0)
Alkaline Phosphatase: 115 U/L (ref 38–126)
Anion gap: 9 (ref 5–15)
BUN: 49 mg/dL — ABNORMAL HIGH (ref 8–23)
CO2: 21 mmol/L — ABNORMAL LOW (ref 22–32)
Calcium: 8.2 mg/dL — ABNORMAL LOW (ref 8.9–10.3)
Chloride: 105 mmol/L (ref 98–111)
Creatinine, Ser: 3.01 mg/dL — ABNORMAL HIGH (ref 0.44–1.00)
GFR, Estimated: 15 mL/min — ABNORMAL LOW (ref >60)
Glucose, Bld: 158 mg/dL — ABNORMAL HIGH (ref 70–99)
Potassium: 4.6 mmol/L (ref 3.5–5.1)
Sodium: 135 mmol/L (ref 135–145)
Total Bilirubin: 0.7 mg/dL (ref 0.3–1.2)
Total Protein: 6.9 g/dL (ref 6.5–8.1)

## 2022-02-21 LAB — CBC WITH DIFFERENTIAL/PLATELET
Abs Immature Granulocytes: 0.04 10*3/uL (ref 0.00–0.07)
Basophils Absolute: 0 10*3/uL (ref 0.0–0.1)
Basophils Relative: 0 %
Eosinophils Absolute: 0 10*3/uL (ref 0.0–0.5)
Eosinophils Relative: 0 %
HCT: 33.2 % — ABNORMAL LOW (ref 36.0–46.0)
Hemoglobin: 10.4 g/dL — ABNORMAL LOW (ref 12.0–15.0)
Immature Granulocytes: 0 %
Lymphocytes Relative: 16 %
Lymphs Abs: 1.6 10*3/uL (ref 0.7–4.0)
MCH: 27.5 pg (ref 26.0–34.0)
MCHC: 31.3 g/dL (ref 30.0–36.0)
MCV: 87.8 fL (ref 80.0–100.0)
Monocytes Absolute: 0.2 10*3/uL (ref 0.1–1.0)
Monocytes Relative: 2 %
Neutro Abs: 8.2 10*3/uL — ABNORMAL HIGH (ref 1.7–7.7)
Neutrophils Relative %: 82 %
Platelets: 182 10*3/uL (ref 150–400)
RBC: 3.78 MIL/uL — ABNORMAL LOW (ref 3.87–5.11)
RDW: 13.7 % (ref 11.5–15.5)
WBC: 10.1 10*3/uL (ref 4.0–10.5)
nRBC: 0 % (ref 0.0–0.2)

## 2022-02-21 LAB — TROPONIN I (HIGH SENSITIVITY)
Troponin I (High Sensitivity): 53 ng/L — ABNORMAL HIGH (ref <18)
Troponin I (High Sensitivity): 49 ng/L — ABNORMAL HIGH (ref <18)

## 2022-02-21 LAB — OSMOLALITY: Osmolality: 309 mOsm/kg — ABNORMAL HIGH (ref 275–295)

## 2022-02-21 LAB — MAGNESIUM: Magnesium: 2.5 mg/dL — ABNORMAL HIGH (ref 1.7–2.4)

## 2022-02-21 LAB — C-REACTIVE PROTEIN: CRP: 5.1 mg/dL — ABNORMAL HIGH (ref <1.0)

## 2022-02-21 LAB — PROCALCITONIN: Procalcitonin: 0.85 ng/mL

## 2022-02-21 LAB — BRAIN NATRIURETIC PEPTIDE: B Natriuretic Peptide: 568.8 pg/mL — ABNORMAL HIGH (ref 0.0–100.0)

## 2022-02-21 MED ORDER — LACTATED RINGERS IV BOLUS
500.0000 mL | Freq: Once | INTRAVENOUS | Status: AC
Start: 2022-02-21 — End: 2022-02-21
  Administered 2022-02-21: 500 mL via INTRAVENOUS

## 2022-02-21 MED ORDER — ALUM & MAG HYDROXIDE-SIMETH 200-200-20 MG/5ML PO SUSP
30.0000 mL | Freq: Once | ORAL | Status: AC
  Administered 2022-02-21: 30 mL via ORAL

## 2022-02-21 MED ORDER — LIDOCAINE VISCOUS HCL 2 % MT SOLN
15.0000 mL | Freq: Once | OROMUCOSAL | Status: DC
  Filled 2022-02-21: qty 15

## 2022-02-21 MED ORDER — LACTATED RINGERS IV SOLN: INTRAVENOUS | Status: AC

## 2022-02-21 MED ORDER — LEVOFLOXACIN 500 MG PO TABS
250.0000 mg | ORAL_TABLET | ORAL | Status: DC
  Administered 2022-02-22: 250 mg via ORAL
  Filled 2022-02-21: qty 1

## 2022-02-21 MED ORDER — ALUM & MAG HYDROXIDE-SIMETH 200-200-20 MG/5ML PO SUSP
30.0000 mL | Freq: Once | ORAL | Status: AC
  Administered 2022-02-21: 30 mL via ORAL
  Filled 2022-02-21: qty 30

## 2022-02-21 NOTE — Plan of Care (Signed)

## 2022-02-21 NOTE — Progress Notes (Signed)
?                                  PROGRESS NOTE                                             ?                                                                                                                     ?                                         ? ? Patient Demographics:  ? ? Jillian [name], is a 86 y.o. female, DOB - 1936/03/14, CVE:[id number] ? ?Outpatient Primary MD for the patient is [name], MD    LOS - 1  Admit date - 02/19/2022   ? ?Chief Complaint  ?Patient presents with  ? Shortness of Breath  ?    ? ?Brief Narrative (HPI from H&P) - 86 y.o. female with history of chronic combined systolic and diastolic CHF, paroxysmal atrial fibrillation, history of CVA, hypertension presents to the ER because of worsening shortness of breath with productive cough and wheezing over the last 1 week, she had some exposure to sick contacts in the last week.  In the ER her work-up was suggestive of acute bronchitis and acute on chronic diastolic CHF and she was admitted. ? ? Subjective:  ? ?Patient in bed, appears comfortable, denies any headache, no fever, no chest pain or pressure, improved cough and shortness of breath , no abdominal pain. No new focal weakness. ? ? ? Assessment  & Plan :  ? ? ?Hypoxic respiratory failure requiring supplemental oxygen due to combination of acute bronchitis and acute on chronic diastolic CHF. She is currently admitted to the hospital, sputum Gram stain culture has been ordered, will trend procalcitonin levels as well, chest x-ray noted.  Currently on Levaquin along with steroids, was gently diuresed on 02/20/2022.  Improved continue to monitor. ? ?2.  Acute on chronic diastolic CHF EF 02% in 5852.  Currently on IV Lasix, continue Coreg, on ARB which I will switch to BiDil as she is being diuresed and will try to avoid AKI in the setting of underlying CKD. ? ?3. AKI on CKD Stage IV.  ARB was held since admission, hydrate, check UA and renal  ultrasound and monitor. ? ?4.  History of CVA.  Continue combination of statin and Eliquis for secondary prevention. ? ?5.  Hypertensive urgency could have been due to shortness of breath, for now Coreg, BiDil and as needed hydralazine. ? ?6.  History of paroxysmal atrial fibrillation with [place] vas 2 score of greater than 3.  On Coreg and Eliquis.  Will require Eliquis prescription upon discharge. ? ?7.  Nonspecific chronic EKG changes.  Monitor clinically. ? ?   ? ?Condition - Fair ? ?Family Communication  : Daughter over patient's phone on 02/21/2022 ? ?Code Status :  Full ? ?Consults  :  None ? ?PUD Prophylaxis : None ? ? Procedures  :    ? ?  ? ?   ? ?Disposition Plan  :   ? ?Status is: Observation ? ?DVT Prophylaxis  :   ? ?apixaban (ELIQUIS) tablet 2.5 mg Start: 02/19/22 2300 ?apixaban (ELIQUIS) tablet 2.5 mg  ?  ? ?Lab Results  ?Component Value Date  ? PLT 182 02/21/2022  ? ? ?Diet :  ?Diet Order   ? ?       ?  Diet Heart Room service appropriate? Yes; Fluid consistency: Thin; Fluid restriction: 1200 mL Fluid  Diet effective now       ?  ? ?  ?  ? ?  ?  ? ?Inpatient Medications ? ?Scheduled Meds: ? apixaban  2.5 mg Oral BID  ? budesonide (PULMICORT) nebulizer solution  0.25 mg Nebulization BID  ? carvedilol  25 mg Oral BID WC  ? ferrous sulfate  325 mg Oral QODAY  ? isosorbide-hydrALAZINE  1 tablet Oral TID  ? [START ON 02/22/2022] levofloxacin  250 mg Oral Q48H  ? lidocaine  15 mL Oral Once  ? methylPREDNISolone (SOLU-MEDROL) injection  40 mg Intravenous Q12H  ? rosuvastatin  40 mg Oral Daily  ? senna-docusate  3 tablet Oral QHS  ? ?Continuous Infusions: ? lactated ringers    ? ?PRN Meds:.albuterol, labetalol ? ?Antibiotics  :   ? ?Anti-infectives (From admission, onward)  ? ? Start     Dose/Rate Route Frequency Ordered Stop  ? 02/22/22 1100  levofloxacin (LEVAQUIN) tablet 250 mg       ?Note to Pharmacy: Pharmacy can adjust for severe bronchitis  ? 250 mg Oral Every 48 hours 02/21/22 0626 02/26/22 1059  ?  02/20/22 1000  levofloxacin (LEVAQUIN) tablet 250 mg  Status:  Discontinued       ?Note to Pharmacy: Pharmacy can adjust for severe bronchitis  ? 250 mg Oral Every 24 hrs x 5 02/20/22 0844 02/21/22 0626  ? 02/19/22 2215  azithromycin (ZITHROMAX) 500 mg in sodium chloride 0.9 % 250 mL IVPB  Status:  Discontinued       ? 500 mg ?250 mL/hr over 60 Minutes Intravenous Every 24 hours 02/19/22 2212 02/20/22 0844  ? ?  ? ? ? Time Spent in minutes  30 ? ? ?Lala Lund M.D on 02/21/2022 at 10:28 AM ? ?To page go to [email or web address]  ? ?[facility] -  Office  [phone] ? ?See all Orders from today for further details ? ? ? Objective:  ? ?Vitals:  ? 02/20/22 2147 02/21/22 0334 02/21/22 0827 02/21/22 0908  ?BP: (!) 122/57 (!) 144/60  (!) 149/60  ?Pulse: 66 68 68 71  ?Resp: 16 18 16 20   ?Temp: 97.8 ?F (36.6 ?C) 97.6 ?F (36.4 ?C)  97.7 ?F (36.5 ?C)  ?TempSrc: Oral Oral  Oral  ?SpO2: 97% 99% 99% 97%  ?Weight:      ?Height:      ? ? ?Wt Readings from Last 3 Encounters:  ?02/19/22 90 kg  ?11/18/21 83.9 kg  ?10/16/21 84.5 kg  ? ? ? ?Intake/Output Summary (Last 24 hours) at 02/21/2022 1028 ?Last data filed at 02/20/2022 2137 ?Gross per 24 hour  ?  Intake 240 ml  ?Output --  ?Net 240 ml  ? ? ? ?Physical Exam ? ?Awake Alert, No new F.N deficits, Normal affect ?[place].AT,PERRAL ?Supple Neck, No JVD,   ?Symmetrical Chest wall movement, Good air movement bilaterally, mild wheezing ?RRR,No Gallops, Rubs or new Murmurs,  ?+ve B.Sounds, Abd Soft, No tenderness,   ?No Cyanosis, Clubbing or edema    ? ? Data Review:  ? ? ?CBC ?Recent Labs  ?Lab 02/19/22 ?1944 02/19/22 ?2030 02/20/22 ?0238 02/21/22 ?0206  ?WBC 7.5  --  8.2 10.1  ?HGB 12.1 11.9* 11.5* 10.4*  ?HCT 40.0 35.0* 37.1 33.2*  ?PLT 151  --  158 182  ?MCV 89.5  --  88.8 87.8  ?MCH 27.1  --  27.5 27.5  ?MCHC 30.3  --  31.0 31.3  ?RDW 13.6  --  13.6 13.7  ?LYMPHSABS 2.0  --   --  1.6  ?MONOABS 1.0  --   --  0.2  ?EOSABS 0.1  --   --  0.0  ?BASOSABS 0.0  --   --  0.0  ? ? ?Electrolytes ?Recent  Labs  ?Lab 02/19/22 ?1944 02/19/22 ?2030 02/20/22 ?0238 02/21/22 ?0206  ?NA 138 140 138 135  ?K 4.1 4.1 4.1 4.6  ?CL 108  --  106 105  ?CO2 24  --  24 21*  ?GLUCOSE 127*  --  123* 158*  ?BUN 23  --  24* 49*  ?CREATININE 2.14*  --  1.93* 3.01*  ?CALCIUM 8.5*  --  8.6* 8.2*  ?AST 22  --  21 20  ?ALT 13  --  14 12  ?ALKPHOS 127*  --  122 115  ?BILITOT 0.6  --  0.9 0.7  ?ALBUMIN 2.5*  --  2.5* 2.3*  ?MG  --   --   --  2.5*  ?CRP  --   --   --  5.1*  ?PROCALCITON  --   --   --  0.85  ?BNP 905.4*  --   --  568.8*  ? ? ?------------------------------------------------------------------------------------------------------------------ ?No results for input(s): CHOL, HDL, LDLCALC, TRIG, CHOLHDL, LDLDIRECT in the last 72 hours. ? ?Lab Results  ?Component Value Date  ? HGBA1C 6.2 (H) 07/08/2021  ? ?Radiology Reports ?DG Chest Port 1 View ? ?Result Date: 02/21/2022 ?CLINICAL DATA:  Shortness of breath EXAM: PORTABLE CHEST 1 VIEW COMPARISON:  Prior chest x-ray 02/19/2022 FINDINGS: Stable cardiomegaly. There is pulmonary vascular congestion with cephalization but no overt pulmonary edema. No significant pleural effusion. Elevation of the left hemidiaphragm with associated streaky opacities favored to reflect atelectasis or scarring. Otherwise, no focal airspace infiltrate. No pneumothorax. No acute osseous abnormality. IMPRESSION: 1. Cardiomegaly with pulmonary vascular congestion but no overt edema. The degree of vascular congestion appears slightly improved. 2. Stable chronic elevation of the left hemidiaphragm. Electronically Signed   By: Jacqulynn Cadet M.D.   On: 02/21/2022 07:03  ? ?DG Chest Port 1 View ? ?Result Date: 02/19/2022 ?CLINICAL DATA:  Shortness of breath EXAM: PORTABLE CHEST 1 VIEW COMPARISON:  07/07/2021 FINDINGS: Unchanged elevation of the left hemidiaphragm. Moderate cardiomegaly. No focal airspace consolidation or pulmonary edema. IMPRESSION: Unchanged elevation of the left hemidiaphragm. No acute airspace  disease. Electronically Signed   By: Ulyses Jarred M.D.   On: 02/19/2022 19:57    ? ? ?

## 2022-02-22 ENCOUNTER — Inpatient Hospital Stay (HOSPITAL_COMMUNITY): Payer: Medicare Other

## 2022-02-22 DIAGNOSIS — D631 Anemia in chronic kidney disease: Secondary | ICD-10-CM | POA: Diagnosis not present

## 2022-02-22 DIAGNOSIS — N184 Chronic kidney disease, stage 4 (severe): Secondary | ICD-10-CM

## 2022-02-22 DIAGNOSIS — I509 Heart failure, unspecified: Secondary | ICD-10-CM

## 2022-02-22 DIAGNOSIS — N179 Acute kidney failure, unspecified: Secondary | ICD-10-CM | POA: Diagnosis not present

## 2022-02-22 LAB — COMPREHENSIVE METABOLIC PANEL
ALT: 13 U/L (ref 0–44)
AST: 14 U/L — ABNORMAL LOW (ref 15–41)
Albumin: 2.1 g/dL — ABNORMAL LOW (ref 3.5–5.0)
Alkaline Phosphatase: 96 U/L (ref 38–126)
Anion gap: 6 (ref 5–15)
BUN: 59 mg/dL — ABNORMAL HIGH (ref 8–23)
CO2: 24 mmol/L (ref 22–32)
Calcium: 8.1 mg/dL — ABNORMAL LOW (ref 8.9–10.3)
Chloride: 104 mmol/L (ref 98–111)
Creatinine, Ser: 3.03 mg/dL — ABNORMAL HIGH (ref 0.44–1.00)
GFR, Estimated: 15 mL/min — ABNORMAL LOW (ref >60)
Glucose, Bld: 136 mg/dL — ABNORMAL HIGH (ref 70–99)
Potassium: 4.4 mmol/L (ref 3.5–5.1)
Sodium: 134 mmol/L — ABNORMAL LOW (ref 135–145)
Total Bilirubin: 0.6 mg/dL (ref 0.3–1.2)
Total Protein: 6.3 g/dL — ABNORMAL LOW (ref 6.5–8.1)

## 2022-02-22 LAB — URINALYSIS, ROUTINE W REFLEX MICROSCOPIC
Bilirubin Urine: NEGATIVE
Glucose, UA: NEGATIVE mg/dL
Hgb urine dipstick: NEGATIVE
Ketones, ur: NEGATIVE mg/dL
Leukocytes,Ua: NEGATIVE
Nitrite: NEGATIVE
Protein, ur: 100 mg/dL — AB
Specific Gravity, Urine: 1.011 (ref 1.005–1.030)
pH: 5 (ref 5.0–8.0)

## 2022-02-22 LAB — OSMOLALITY, URINE: Osmolality, Ur: 374 mOsm/kg (ref 300–900)

## 2022-02-22 LAB — CBC WITH DIFFERENTIAL/PLATELET
Abs Immature Granulocytes: 0.07 10*3/uL (ref 0.00–0.07)
Basophils Absolute: 0 10*3/uL (ref 0.0–0.1)
Basophils Relative: 0 %
Eosinophils Absolute: 0 10*3/uL (ref 0.0–0.5)
Eosinophils Relative: 0 %
HCT: 30.6 % — ABNORMAL LOW (ref 36.0–46.0)
Hemoglobin: 9.7 g/dL — ABNORMAL LOW (ref 12.0–15.0)
Immature Granulocytes: 1 %
Lymphocytes Relative: 11 %
Lymphs Abs: 1.5 10*3/uL (ref 0.7–4.0)
MCH: 27.3 pg (ref 26.0–34.0)
MCHC: 31.7 g/dL (ref 30.0–36.0)
MCV: 86.2 fL (ref 80.0–100.0)
Monocytes Absolute: 0.3 10*3/uL (ref 0.1–1.0)
Monocytes Relative: 2 %
Neutro Abs: 12.2 10*3/uL — ABNORMAL HIGH (ref 1.7–7.7)
Neutrophils Relative %: 86 %
Platelets: 186 10*3/uL (ref 150–400)
RBC: 3.55 MIL/uL — ABNORMAL LOW (ref 3.87–5.11)
RDW: 13.6 % (ref 11.5–15.5)
WBC: 14.1 10*3/uL — ABNORMAL HIGH (ref 4.0–10.5)
nRBC: 0 % (ref 0.0–0.2)

## 2022-02-22 LAB — C-REACTIVE PROTEIN: CRP: 2 mg/dL — ABNORMAL HIGH (ref <1.0)

## 2022-02-22 LAB — BRAIN NATRIURETIC PEPTIDE: B Natriuretic Peptide: 379.7 pg/mL — ABNORMAL HIGH (ref 0.0–100.0)

## 2022-02-22 LAB — SODIUM, URINE, RANDOM: Sodium, Ur: <10 mmol/L

## 2022-02-22 LAB — PROCALCITONIN: Procalcitonin: 0.65 ng/mL

## 2022-02-22 LAB — MAGNESIUM: Magnesium: 2.7 mg/dL — ABNORMAL HIGH (ref 1.7–2.4)

## 2022-02-22 LAB — CREATININE, URINE, RANDOM: Creatinine, Urine: 104.86 mg/dL

## 2022-02-22 MED ORDER — LACTATED RINGERS IV SOLN: INTRAVENOUS | Status: AC

## 2022-02-22 MED ORDER — ROSUVASTATIN CALCIUM 5 MG PO TABS
5.0000 mg | ORAL_TABLET | Freq: Every day | ORAL | Status: DC
  Administered 2022-02-23: 5 mg via ORAL
  Filled 2022-02-22: qty 1

## 2022-02-22 NOTE — Progress Notes (Signed)
?                                  PROGRESS NOTE                                             ?                                                                                                                     ?                                         ? ? Patient Demographics:  ? ? Jillian [name], is a 86 y.o. female, DOB - [birth year], KDT:[id number] ? ?Outpatient Primary MD for the patient is [name], MD    LOS - 2  Admit date - 02/19/2022   ? ?Chief Complaint  ?Patient presents with  ? Shortness of Breath  ?    ? ?Brief Narrative (HPI from H&P) - 86 y.o. female with history of chronic combined systolic and diastolic CHF, paroxysmal atrial fibrillation, history of CVA, hypertension presents to the ER because of worsening shortness of breath with productive cough and wheezing over the last 1 week, she had some exposure to sick contacts in the last week.  In the ER her work-up was suggestive of acute bronchitis and acute on chronic diastolic CHF and she was admitted. ? ? Subjective:  ? ?Patient in bed, appears comfortable, denies any headache, no fever, no chest pain or pressure, no shortness of breath , no abdominal pain. No new focal weakness. ? ? Assessment  & Plan :  ? ? ?Hypoxic respiratory failure requiring supplemental oxygen due to combination of acute bronchitis and acute on chronic diastolic CHF. She is currently admitted to the hospital, sputum Gram stain culture has been ordered, will trend procalcitonin levels as well, chest x-ray noted.  Currently on Levaquin along with steroids, clinically continues to improve has been encouraged to sit up in chair and use I-S and flutter valve for pulmonary toiletry which I think will make a huge difference, nursing staff also requested to monitor on 02/22/2022. ? ?2.  Acute on chronic diastolic CHF EF 99% in 8338.  Initially required some IV Lasix, continue Coreg, on ARB and switch to BiDil due to AKI and CKD. ? ?3. AKI on CKD  Stage IV.  ARB was held since admission, urine sodium under 10 suggestive dehydration continue IV fluids and monitor.  Acute renal ultrasound ? ?4.  History of CVA.  Continue combination of statin and Eliquis for secondary prevention. ? ?5.  Hypertensive urgency could have been due to shortness of breath, for now Coreg, BiDil and as needed hydralazine. ? ?  6.  History of paroxysmal atrial fibrillation with [place] vas 2 score of greater than 3.  On Coreg and Eliquis.  Will require Eliquis prescription upon discharge. ? ?7.  Nonspecific chronic EKG changes.  Monitor clinically. ? ?   ? ?Condition - Fair ? ?Family Communication  : Daughter over patient's phone on 02/21/2022 ? ?Code Status :  Full ? ?Consults  :  None ? ?PUD Prophylaxis : None ? ? Procedures  :    ? ? Renal US  - 1. Bilateral renal cortical thinning. 2. No hydronephrosis. ? ?   ? ?Disposition Plan  :   ? ?Status is: Observation ? ?DVT Prophylaxis  :   ? ?apixaban (ELIQUIS) tablet 2.5 mg Start: 02/19/22 2300 ?apixaban (ELIQUIS) tablet 2.5 mg  ?  ? ?Lab Results  ?Component Value Date  ? PLT 186 02/22/2022  ? ? ?Diet :  ?Diet Order   ? ?       ?  Diet Heart Room service appropriate? Yes; Fluid consistency: Thin; Fluid restriction: 1200 mL Fluid  Diet effective now       ?  ? ?  ?  ? ?  ?  ? ?Inpatient Medications ? ?Scheduled Meds: ? apixaban  2.5 mg Oral BID  ? budesonide (PULMICORT) nebulizer solution  0.25 mg Nebulization BID  ? carvedilol  25 mg Oral BID WC  ? ferrous sulfate  325 mg Oral QODAY  ? isosorbide-hydrALAZINE  1 tablet Oral TID  ? levofloxacin  250 mg Oral Q48H  ? lidocaine  15 mL Oral Once  ? methylPREDNISolone (SOLU-MEDROL) injection  40 mg Intravenous Q12H  ? rosuvastatin  40 mg Oral Daily  ? senna-docusate  3 tablet Oral QHS  ? ?Continuous Infusions: ? lactated ringers 100 mL/hr at 02/22/22 0856  ? ?PRN Meds:.albuterol, labetalol ? ?Antibiotics  :   ? ?Anti-infectives (From admission, onward)  ? ? Start     Dose/Rate Route Frequency Ordered  Stop  ? 02/22/22 1100  levofloxacin (LEVAQUIN) tablet 250 mg       ?Note to Pharmacy: Pharmacy can adjust for severe bronchitis  ? 250 mg Oral Every 48 hours 02/21/22 0626 02/26/22 1059  ? 02/20/22 1000  levofloxacin (LEVAQUIN) tablet 250 mg  Status:  Discontinued       ?Note to Pharmacy: Pharmacy can adjust for severe bronchitis  ? 250 mg Oral Every 24 hrs x 5 02/20/22 0844 02/21/22 0626  ? 02/19/22 2215  azithromycin (ZITHROMAX) 500 mg in sodium chloride 0.9 % 250 mL IVPB  Status:  Discontinued       ? 500 mg ?250 mL/hr over 60 Minutes Intravenous Every 24 hours 02/19/22 2212 02/20/22 0844  ? ?  ? ? ? Time Spent in minutes  30 ? ? ?Lala Lund M.D on 02/22/2022 at 10:22 AM ? ?To page go to [email or web address]  ? ?[facility] -  Office  [phone] ? ?See all Orders from today for further details ? ? ? Objective:  ? ?Vitals:  ? 02/21/22 2059 02/22/22 0550 02/22/22 0809 02/22/22 0827  ?BP:  (!) 142/90 (!) 169/72   ?Pulse:  68 60   ?Resp:  16 18   ?Temp:   98.8 ?F (37.1 ?C)   ?TempSrc:   Oral   ?SpO2: 98% 96% 100% 99%  ?Weight:      ?Height:      ? ? ?Wt Readings from Last 3 Encounters:  ?02/19/22 90 kg  ?11/18/21 83.9 kg  ?10/16/21 84.5 kg  ? ? ? ?  Intake/Output Summary (Last 24 hours) at 02/22/2022 1022 ?Last data filed at 02/22/2022 0400 ?Gross per 24 hour  ?Intake 120 ml  ?Output --  ?Net 120 ml  ? ? ? ?Physical Exam ? ?Awake Alert, No new F.N deficits, Normal affect ?[place].AT,PERRAL ?Supple Neck, No JVD,   ?Symmetrical Chest wall movement, Good air movement bilaterally, CTAB ?RRR,No Gallops, Rubs or new Murmurs,  ?+ve B.Sounds, Abd Soft, No tenderness,   ?No Cyanosis, Clubbing or edema  ? ? ? Data Review:  ? ? ?CBC ?Recent Labs  ?Lab 02/19/22 ?1944 02/19/22 ?2030 02/20/22 ?0238 02/21/22 ?0206 02/22/22 ?5993  ?WBC 7.5  --  8.2 10.1 14.1*  ?HGB 12.1 11.9* 11.5* 10.4* 9.7*  ?HCT 40.0 35.0* 37.1 33.2* 30.6*  ?PLT 151  --  158 182 186  ?MCV 89.5  --  88.8 87.8 86.2  ?MCH 27.1  --  27.5 27.5 27.3  ?MCHC 30.3  --  31.0  31.3 31.7  ?RDW 13.6  --  13.6 13.7 13.6  ?LYMPHSABS 2.0  --   --  1.6 1.5  ?MONOABS 1.0  --   --  0.2 0.3  ?EOSABS 0.1  --   --  0.0 0.0  ?BASOSABS 0.0  --   --  0.0 0.0  ? ? ?Electrolytes ?Recent Labs  ?Lab 02/19/22 ?1944 02/19/22 ?2030 02/20/22 ?0238 02/21/22 ?0206 02/22/22 ?5701  ?NA 138 140 138 135 134*  ?K 4.1 4.1 4.1 4.6 4.4  ?CL 108  --  106 105 104  ?CO2 24  --  24 21* 24  ?GLUCOSE 127*  --  123* 158* 136*  ?BUN 23  --  24* 49* 59*  ?CREATININE 2.14*  --  1.93* 3.01* 3.03*  ?CALCIUM 8.5*  --  8.6* 8.2* 8.1*  ?AST 22  --  21 20 14*  ?ALT 13  --  14 12 13   ?ALKPHOS 127*  --  122 115 96  ?BILITOT 0.6  --  0.9 0.7 0.6  ?ALBUMIN 2.5*  --  2.5* 2.3* 2.1*  ?MG  --   --   --  2.5* 2.7*  ?CRP  --   --   --  5.1* 2.0*  ?PROCALCITON  --   --   --  0.85 0.65  ?BNP 905.4*  --   --  568.8* 379.7*  ? ? ?------------------------------------------------------------------------------------------------------------------ ?No results for input(s): CHOL, HDL, LDLCALC, TRIG, CHOLHDL, LDLDIRECT in the last 72 hours. ? ?Lab Results  ?Component Value Date  ? HGBA1C 6.2 (H) 07/08/2021  ? ?Radiology Reports ?US RENAL ? ?Result Date: 02/21/2022 ?CLINICAL DATA:  Acute kidney injury. EXAM: RENAL / URINARY TRACT ULTRASOUND COMPLETE COMPARISON:  09/15/2020 FINDINGS: Right Kidney: Renal measurements: 8.9 x 4.0 x 4.2 cm = volume: 77 mL. Cortical thinning without hydronephrosis. Left Kidney: Renal measurements: 9.7 x 5.3 x 6.1 cm = volume: 164 mL. Cortical thinning. 2.3 cm simple appearing cyst identified in the interpolar region. Bladder: Appears normal for degree of bladder distention. Other: None. IMPRESSION: 1. Bilateral renal cortical thinning. 2. No hydronephrosis. Electronically Signed   By: Misty Stanley M.D.   On: 02/21/2022 13:48  ? ?DG Chest Port 1 View ? ?Result Date: 02/22/2022 ?CLINICAL DATA:  Shortness of breath and cough. EXAM: PORTABLE CHEST 1 VIEW COMPARISON:  Portable chest yesterday at 6:51 a.m. FINDINGS: 6:38 a.m.,  02/22/2022. Stable cardiomegaly, aortic tortuosity, ectasia and calcification. There is continued improvement in vascular congestion with mild central vascular fullness remaining. Edema is seen. There is a stable

## 2022-02-22 NOTE — Progress Notes (Signed)
Mobility Specialist Progress Note  ? ? 02/22/22 1417  ?Mobility  ?Activity Ambulated with assistance in hallway  ?Level of Assistance Standby assist, set-up cues, supervision of patient - no hands on  ?Assistive Device Front wheel walker  ?Distance Ambulated (ft) 130 ft  ?Activity Response Tolerated well  ?$Mobility charge 1 Mobility  ? ?Pt received in chair and agreeable. C/o being tired upon return. Had void in BR. Returned to bed with call bell in reach.   ? ?Hildred Alamin ?Mobility Specialist  ?  ?

## 2022-02-23 ENCOUNTER — Other Ambulatory Visit (HOSPITAL_COMMUNITY): Payer: Self-pay

## 2022-02-23 DIAGNOSIS — I509 Heart failure, unspecified: Secondary | ICD-10-CM | POA: Diagnosis not present

## 2022-02-23 LAB — CBC WITH DIFFERENTIAL/PLATELET
Abs Immature Granulocytes: 0.12 10*3/uL — ABNORMAL HIGH (ref 0.00–0.07)
Basophils Absolute: 0 10*3/uL (ref 0.0–0.1)
Basophils Relative: 0 %
Eosinophils Absolute: 0 10*3/uL (ref 0.0–0.5)
Eosinophils Relative: 0 %
HCT: 32.2 % — ABNORMAL LOW (ref 36.0–46.0)
Hemoglobin: 10.1 g/dL — ABNORMAL LOW (ref 12.0–15.0)
Immature Granulocytes: 1 %
Lymphocytes Relative: 8 %
Lymphs Abs: 1 10*3/uL (ref 0.7–4.0)
MCH: 27.2 pg (ref 26.0–34.0)
MCHC: 31.4 g/dL (ref 30.0–36.0)
MCV: 86.8 fL (ref 80.0–100.0)
Monocytes Absolute: 0.2 10*3/uL (ref 0.1–1.0)
Monocytes Relative: 2 %
Neutro Abs: 10.4 10*3/uL — ABNORMAL HIGH (ref 1.7–7.7)
Neutrophils Relative %: 89 %
Platelets: 191 10*3/uL (ref 150–400)
RBC: 3.71 MIL/uL — ABNORMAL LOW (ref 3.87–5.11)
RDW: 13.6 % (ref 11.5–15.5)
WBC: 11.7 10*3/uL — ABNORMAL HIGH (ref 4.0–10.5)
nRBC: 0 % (ref 0.0–0.2)

## 2022-02-23 LAB — COMPREHENSIVE METABOLIC PANEL
ALT: 16 U/L (ref 0–44)
AST: 24 U/L (ref 15–41)
Albumin: 2.2 g/dL — ABNORMAL LOW (ref 3.5–5.0)
Alkaline Phosphatase: 102 U/L (ref 38–126)
Anion gap: 6 (ref 5–15)
BUN: 63 mg/dL — ABNORMAL HIGH (ref 8–23)
CO2: 24 mmol/L (ref 22–32)
Calcium: 8.2 mg/dL — ABNORMAL LOW (ref 8.9–10.3)
Chloride: 105 mmol/L (ref 98–111)
Creatinine, Ser: 2.86 mg/dL — ABNORMAL HIGH (ref 0.44–1.00)
GFR, Estimated: 16 mL/min — ABNORMAL LOW (ref >60)
Glucose, Bld: 140 mg/dL — ABNORMAL HIGH (ref 70–99)
Potassium: 4.1 mmol/L (ref 3.5–5.1)
Sodium: 135 mmol/L (ref 135–145)
Total Bilirubin: 0.3 mg/dL (ref 0.3–1.2)
Total Protein: 6.5 g/dL (ref 6.5–8.1)

## 2022-02-23 LAB — C-REACTIVE PROTEIN: CRP: 1 mg/dL — ABNORMAL HIGH (ref <1.0)

## 2022-02-23 LAB — MAGNESIUM: Magnesium: 2.8 mg/dL — ABNORMAL HIGH (ref 1.7–2.4)

## 2022-02-23 LAB — BRAIN NATRIURETIC PEPTIDE: B Natriuretic Peptide: 468.4 pg/mL — ABNORMAL HIGH (ref 0.0–100.0)

## 2022-02-23 MED ORDER — METHYLPREDNISOLONE 4 MG PO TBPK
ORAL_TABLET | ORAL | 0 refills | Status: DC
  Filled 2022-02-23: qty 21, 6d supply, fill #0

## 2022-02-23 MED ORDER — LEVOFLOXACIN 500 MG PO TABS
500.0000 mg | ORAL_TABLET | ORAL | 0 refills | Status: AC
  Filled 2022-02-23: qty 3, 6d supply, fill #0

## 2022-02-23 MED ORDER — METHYLPREDNISOLONE SODIUM SUCC 40 MG IJ SOLR
40.0000 mg | Freq: Every day | INTRAMUSCULAR | Status: DC
Start: 2022-02-23 — End: 2022-02-23
  Administered 2022-02-23: 40 mg via INTRAVENOUS
  Filled 2022-02-23: qty 1

## 2022-02-23 MED ORDER — ROSUVASTATIN CALCIUM 10 MG PO TABS
10.0000 mg | ORAL_TABLET | Freq: Every day | ORAL | 0 refills | Status: DC
  Filled 2022-02-23: qty 30, 30d supply, fill #0

## 2022-02-23 MED ORDER — HYDRALAZINE HCL 50 MG PO TABS
50.0000 mg | ORAL_TABLET | Freq: Three times a day (TID) | ORAL | 0 refills | Status: DC
  Filled 2022-02-23: qty 90, 30d supply, fill #0

## 2022-02-23 MED ORDER — ISOSORBIDE MONONITRATE ER 30 MG PO TB24
30.0000 mg | ORAL_TABLET | Freq: Every day | ORAL | 0 refills | Status: DC
  Filled 2022-02-23: qty 30, 30d supply, fill #0

## 2022-02-23 MED ORDER — LACTATED RINGERS IV SOLN: INTRAVENOUS | Status: DC

## 2022-02-23 NOTE — Progress Notes (Signed)
Occupational Therapy Treatment ?Patient Details ?Name: Jillian [name] ?MRN: [id number] ?DOB: 1936/02/18 ?Today's Date: 02/23/2022 ? ? ?History of present illness 86 y.o. female admitted 05/2022 with SOB, cough and wheezing for 1 week; workup for acute bronchitis and hypertensive urgency. PMH includes CHF, PAF, CVA, HTN. ?  ?OT comments ? Pt progressing towards established OT goals. Pt performing UB and LB dressing at Mod I level with increased time as needed. Pt performing functional mobility in hallway with RW - no physical A needed. Continues to require  cues problem solving (such as placing clothes on right side out) but feel this is patient's baseline. Recommend dc to home once medically stable per physician. Answered all questions in preparation for dc later today.  ? ?Recommendations for follow up therapy are one component of a multi-disciplinary discharge planning process, led by the attending physician.  Recommendations may be updated based on patient status, additional functional criteria and insurance authorization. ?   ?Follow Up Recommendations ? No OT follow up  ?  ?Assistance Recommended at Discharge Intermittent Supervision/Assistance  ?Patient can return home with the following ?   ?  ?Equipment Recommendations ? None recommended by OT  ?  ?Recommendations for Other Services   ? ?  ?Precautions / Restrictions Precautions ?Precautions: Fall ?Restrictions ?Weight Bearing Restrictions: No  ? ? ?  ? ?Mobility Bed Mobility ?  ?  ?  ?  ?  ?  ?  ?General bed mobility comments: Received sitting in recliner ?  ? ?Transfers ?Overall transfer level: Modified independent ?  ?  ?  ?  ?  ?  ?  ?  ?  ?  ?  ?Balance Overall balance assessment: Needs assistance ?Sitting-balance support: Feet supported, No upper extremity supported ?Sitting balance-Leahy Scale: Good ?  ?  ?Standing balance support: No upper extremity supported ?Standing balance-Leahy Scale: Good ?Standing balance comment: Performing mobility in room  without UE support; requesting to use RW in hallway for comfort ?  ?  ?  ?  ?  ?  ?  ?  ?  ?  ?  ?   ? ?ADL either performed or assessed with clinical judgement  ? ?ADL Overall ADL's : Modified independent ?  ?  ?  ?  ?  ?  ?  ?  ?  ?  ?  ?  ?  ?  ?  ?  ?  ?  ?  ?General ADL Comments: Pt performing UB dressing, LB dressing, and mobility in hallway at Mod I level with increased time. Pt performing mobility without RW in room and during dressing. Requesting to use it in hallway for comfort. ?  ? ?Extremity/Trunk Assessment Upper Extremity Assessment ?Upper Extremity Assessment: Overall WFL for tasks assessed ?  ?Lower Extremity Assessment ?Lower Extremity Assessment: Generalized weakness ?  ?  ?  ? ?Vision   ?  ?  ?Perception   ?  ?Praxis   ?  ? ?Cognition Arousal/Alertness: Awake/alert ?Behavior During Therapy: [facility] for tasks assessed/performed ?Overall Cognitive Status: Within Functional Limits for tasks assessed ?  ?  ?  ?  ?  ?  ?  ?  ?  ?  ?  ?  ?  ?  ?  ?  ?General Comments: WFL for simple tasks; decreased awareness and problem solving, likely baseline cognition ?  ?  ?   ?Exercises   ? ?  ?Shoulder Instructions   ? ? ?  ?General Comments discussed DME needs as pt  has rollator at home but was ambulating without device - pt reports rollator too heavy to pick up, requesting RW use for home  ? ? ?Pertinent Vitals/ Pain       Pain Assessment ?Pain Assessment: No/denies pain ? ?Home Living   ?  ?  ?  ?  ?  ?  ?  ?  ?  ?  ?  ?  ?  ?  ?  ?  ?  ?  ? ?  ?Prior Functioning/Environment    ?  ?  ?  ?   ? ?Frequency ? Min 2X/week  ? ? ? ? ?  ?Progress Toward Goals ? ?OT Goals(current goals can now be found in the care plan section) ? Progress towards OT goals: Progressing toward goals ? ?Acute Rehab OT Goals ?OT Goal Formulation: With patient ?Time For Goal Achievement: 03/06/22 ?Potential to Achieve Goals: Good ?ADL Goals ?Pt Will Perform Grooming: with modified independence;standing ?Pt Will Perform Lower Body Dressing:  with modified independence;sit to/from stand ?Pt Will Transfer to Toilet: with modified independence;ambulating;regular height toilet ?Pt Will Perform Toileting - Clothing Manipulation and hygiene: with modified independence;sitting/lateral leans ?Pt Will Perform Tub/Shower Transfer: Tub transfer;with supervision;shower seat;ambulating  ?Plan Discharge plan remains appropriate   ? ?Co-evaluation ? ? ?   ?  ?  ?  ?  ? ?  ?AM-PAC OT "6 Clicks" Daily Activity     ?Outcome Measure ? ? Help from another person eating meals?: None ?Help from another person taking care of personal grooming?: None ?Help from another person toileting, which includes using toliet, bedpan, or urinal?: None ?Help from another person bathing (including washing, rinsing, drying)?: None ?Help from another person to put on and taking off regular upper body clothing?: None ?Help from another person to put on and taking off regular lower body clothing?: None ?6 Click Score: 24 ? ?  ?End of Session Equipment Utilized During Treatment: Rolling walker (2 wheels) ? ?OT Visit Diagnosis: Unsteadiness on feet (R26.81);Other abnormalities of gait and mobility (R26.89);Muscle weakness (generalized) (M62.81) ?  ?Activity Tolerance Patient tolerated treatment well ?  ?Patient Left in chair;with call bell/phone within reach;with chair alarm set ?  ?Nurse Communication Mobility status ?  ? ?   ? ?Time: 1610-9604 ?OT Time Calculation (min): 14 min ? ?Charges: OT General Charges ?$OT Visit: 1 Visit ?OT Treatments ?$Self Care/Home Management : 8-22 mins ? ?Jesiel Garate MSOT, OTR/L ?Acute Rehab ?Pager: [phone] ?Office: [phone] ? ?Dorotea Hand M Cormac Wint ?02/23/2022, 11:44 AM ? ? ?

## 2022-02-23 NOTE — Progress Notes (Signed)
Physical Therapy Treatment ?Patient Details ?Name: Jillian [name] ?MRN: [id number] ?DOB: 1936/03/11 ?Today's Date: 02/23/2022 ? ? ?History of Present Illness Pt is an 86 y.o. female admitted 05/2022 with SOB, cough and wheezing for 1 week; workup for acute bronchitis and hypertensive urgency. PMH includes CHF, PAF, CVA, HTN. ?  ?PT Comments  ? ? Pt progressing with mobility. Today's session focused on ambulation for improving strength and activity tolerance, pt denies SOB. Pt declined gait training without DME, reports preference for RW use at home. Pt preparing for d/c home today. If to remain admitted, will continue to follow acutely. ?   ?Recommendations for follow up therapy are one component of a multi-disciplinary discharge planning process, led by the attending physician.  Recommendations may be updated based on patient status, additional functional criteria and insurance authorization. ? ?Follow Up Recommendations ? Outpatient PT ?  ?  ?Assistance Recommended at Discharge PRN  ?Patient can return home with the following Help with stairs or ramp for entrance;Assistance with cooking/housework;A little help with bathing/dressing/bathroom ?  ?Equipment Recommendations ? Rolling walker (2 wheels)  ?  ?Recommendations for Other Services   ? ? ?  ?Precautions / Restrictions Precautions ?Precautions: Fall ?Restrictions ?Weight Bearing Restrictions: No  ?  ? ?Mobility ? Bed Mobility ?  ?  ?  ?  ?  ?  ?  ?General bed mobility comments: Received sitting in recliner ?  ? ?Transfers ?Overall transfer level: Modified independent ?Equipment used: Rolling walker (2 wheels) ?Transfers: Sit to/from Stand ?  ?  ?  ?  ?  ?  ?General transfer comment: pt declined trial without DME, mod indep with RW ?  ? ?Ambulation/Gait ?Ambulation/Gait assistance: Supervision ?Gait Distance (Feet): 450 Feet ?Assistive device: Rolling walker (2 wheels) ?Gait Pattern/deviations: Step-through pattern, Decreased stride length ?Gait velocity:  Decreased ?  ?  ?General Gait Details: Slow, steady gait with RW and supervision for safety; encouraged pt to attempt gait trial without RW (indep without DME baseline), pt pressing through walker lightly but ultimately declines attempts without; endorses incraesed BLE fatigue ? ? ?Stairs ?Stairs:  (pt reports no stairs, declined stair training) ?  ?  ?  ?  ? ? ?Wheelchair Mobility ?  ? ?Modified Rankin (Stroke Patients Only) ?  ? ? ?  ?Balance   ?Sitting-balance support: Feet supported, No upper extremity supported ?Sitting balance-Leahy Scale: Good ?  ?  ?Standing balance support: Single extremity supported, Reliant on assistive device for balance, No upper extremity supported ?Standing balance-Leahy Scale: Fair ?  ?  ?  ?  ?  ?  ?  ?  ?  ?  ?  ?  ?  ? ?  ?Cognition Arousal/Alertness: Awake/alert ?Behavior During Therapy: [facility] for tasks assessed/performed ?Overall Cognitive Status: Within Functional Limits for tasks assessed ?  ?  ?  ?  ?  ?  ?  ?  ?  ?  ?  ?  ?  ?  ?  ?  ?General Comments: WFL for simple tasks; decreased awareness and problem solving, likely baseline cognition ?  ?  ? ?  ?Exercises   ? ?  ?General Comments General comments (skin integrity, edema, etc.): discussed DME needs as pt has rollator at home but was ambulating without device - pt reports rollator too heavy to pick up, requesting RW use for home ?  ?  ? ?Pertinent Vitals/Pain Pain Assessment ?Pain Assessment: No/denies pain  ? ? ?Home Living   ?  ?  ?  ?  ?  ?  ?  ?  ?  ?   ?  ?  Prior Function    ?  ?  ?   ? ?PT Goals (current goals can now be found in the care plan section) Progress towards PT goals: Progressing toward goals ? ?  ?Frequency ? ? ? Min 3X/week ? ? ? ?  ?PT Plan Current plan remains appropriate  ? ? ?Co-evaluation   ?  ?  ?  ?  ? ?  ?AM-PAC PT "6 Clicks" Mobility   ?Outcome Measure ? Help needed turning from your back to your side while in a flat bed without using bedrails?: None ?Help needed moving from lying on your back to  sitting on the side of a flat bed without using bedrails?: None ?Help needed moving to and from a bed to a chair (including a wheelchair)?: A Little ?Help needed standing up from a chair using your arms (e.g., wheelchair or bedside chair)?: None ?Help needed to walk in hospital room?: A Little ?Help needed climbing 3-5 steps with a railing? : A Little ?6 Click Score: 21 ? ?  ?End of Session Equipment Utilized During Treatment: Gait belt ?Activity Tolerance: Patient tolerated treatment well ?Patient left: in chair;with call bell/phone within reach;with chair alarm set ?Nurse Communication: Mobility status ?PT Visit Diagnosis: Other abnormalities of gait and mobility (R26.89);Muscle weakness (generalized) (M62.81) ?  ? ? ?Time: 5997-7414 ?PT Time Calculation (min) (ACUTE ONLY): 20 min ? ?Charges:  $Therapeutic Exercise: 8-22 mins          ?          ? ?Mabeline Caras, PT, DPT ?Acute Rehabilitation Services  ?Pager [phone] ?Office [phone] ? ?Derry Lory ?02/23/2022, 8:43 AM ? ?

## 2022-02-23 NOTE — Discharge Summary (Signed)
?                                                                                ? Jillian [name] ZHY:[id number] DOB: 13-Feb-1936 DOA: 02/19/2022 ? ?PCP: [name], MD ? ?Admit date: 02/19/2022  Discharge date: 02/23/2022 ? ?Admitted From: Home   Disposition:  Home ? ? ?Recommendations for Outpatient Follow-up:  ? ?Follow up with PCP in 1-2 weeks ? ?PCP Please obtain BMP/CBC, 2 view CXR in 1week,  (see Discharge instructions)  ? ?PCP Please follow up on the following pending results: Please monitor CBC, BMP closely, needs close outpatient follow-up with her nephrologist. ? ? ?Home Health: PT,OT   ?Equipment/Devices: as below  ?Consultations: None  ?Discharge Condition: Stable    ?CODE STATUS: Full    ?Diet Recommendation: Heart Healthy with 1.5 L fluid restriction per day. ? ? ?Chief Complaint  ?Patient presents with  ? Shortness of Breath  ?  ? ?Brief history of present illness from the day of admission and additional interim summary   ? ?86 y.o. female with history of chronic combined systolic and diastolic CHF, paroxysmal atrial fibrillation, history of CVA, hypertension presents to the ER because of worsening shortness of breath with productive cough and wheezing over the last 1 week, she had some exposure to sick contacts in the last week.  In the ER her work-up was suggestive of acute bronchitis and acute on chronic diastolic CHF and she was admitted. ? ?                                                               Hospital Course  ? ?Hypoxic respiratory failure requiring supplemental oxygen due to combination of acute bronchitis and acute on chronic diastolic CHF. She is currently admitted to the hospital, sputum Gram stain culture has been ordered, will trend procalcitonin levels as well, chest x-ray noted.  Currently on Levaquin along with steroids, clinically continues to improve has been encouraged to sit up in  chair and use I-S and flutter valve for pulmonary toiletry which I think will make a huge difference, nursing staff also requested to monitor on 02/22/2022. ?  ?2.  Acute on chronic diastolic CHF EF 29% in 5284.  Initially required some IV Lasix, continue Coreg, on ARB and switch to BiDil due to AKI and CKD. ?  ?3. AKI on CKD Stage IV.  ARB was held since admission, urine sodium under 10 suggestive dehydration continue IV fluids and monitor.  Acute renal ultrasound ?  ?4.  History of CVA.  Continue combination of statin and Eliquis for secondary prevention. ?  ?5.  Hypertensive urgency could have been due to shortness of breath, for now Coreg, BiDil and as needed hydralazine. ?  ?6.  History of paroxysmal atrial fibrillation with [place] vas 2 score of greater than 3.  On Coreg and Eliquis.  Will require Eliquis prescription upon discharge. ?  ?7.  Nonspecific chronic EKG changes.  Monitor clinically. ? ? ?  Discharge diagnosis   ? ? ?Principal Problem: ?  Acute bronchitis ?Active Problems: ?  CKD (chronic kidney disease) stage 4, GFR 15-29 ml/min (HCC) ?  Anemia due to stage 4 chronic kidney disease ([place]) ?  Chronic diastolic heart failure ([place]) ?  History of CVA (cerebrovascular accident) ? ? ? ?Discharge instructions   ? ?Discharge Instructions   ? ? Discharge instructions   Complete by: As directed ?  ? Follow with Primary MD [name], MD and with your nephrologist in 7 days  ? ?Get CBC, CMP, 2 view Chest X ray -  checked next visit within 1 week by Primary MD   ? ?Activity: As tolerated with Full fall precautions use walker/cane & assistance as needed ? ?Disposition Home  ? ?Diet: Heart Healthy with 1.5 L fluid restriction per day. ? ?Special Instructions: If you have smoked or chewed Tobacco  in the last 2 yrs please stop smoking, stop any regular Alcohol  and or any Recreational drug use. ? ?On your next visit with your primary care physician please Get Medicines reviewed and adjusted. ? ?Please request  your Prim.MD to go over all Hospital Tests and Procedure/Radiological results at the follow up, please get all Hospital records sent to your Prim MD by signing hospital release before you go home. ? ?If you experience worsening of your admission symptoms, develop shortness of breath, life threatening emergency, suicidal or homicidal thoughts you must seek medical attention immediately by calling 911 or calling your MD immediately  if symptoms less severe. ? ?You Must read complete instructions/literature along with all the possible adverse reactions/side effects for all the Medicines you take and that have been prescribed to you. Take any new Medicines after you have completely understood and accpet all the possible adverse reactions/side effects.  ? Increase activity slowly   Complete by: As directed ?  ? ?  ? ? ?Discharge Medications  ? ?Allergies as of 02/23/2022   ? ?   Reactions  ? Latex Other (See Comments)  ? "Blisters my skin and tears it off"- skin burns, too  ? Penicillins Nausea Only  ? Tape Other (See Comments)  ? "Tapes tears off my skin"  ? Aspirin Other (See Comments)  ? Irritates the stomach  ? Atorvastatin Other (See Comments)  ? Caused fatigue  ? Diltiazem Hcl Nausea And Vomiting  ? ?  ? ?  ?Medication List  ?  ? ?STOP taking these medications   ? ?telmisartan 80 MG tablet ?Commonly known as: MICARDIS ?  ? ?  ? ?TAKE these medications   ? ?apixaban 2.5 MG Tabs tablet ?Commonly known as: ELIQUIS ?Take 1 tablet (2.5 mg total) by mouth 2 (two) times daily. ?  ?carvedilol 25 MG tablet ?Commonly known as: COREG ?Take 1 tablet (25 mg total) by mouth 2 (two) times daily. ?  ?Coricidin HBP Flu 15-500-2 MG Tabs ?Generic drug: DM-APAP-CPM ?Take 1 capsule by mouth every 6 (six) hours as needed (for flu-like symptoms). ?  ?desoximetasone 0.25 % cream ?Commonly known as: Topicort ?Apply 1 application topically 2 (two) times daily. ?  ?furosemide 20 MG tablet ?Commonly known as: LASIX ?TAKE 3 TABLETS BY MOUTH  ONCE DAILY KEEP  DECEMBER  APPT  WITH  CARDIOLOGIST  FOR  FURTHER  REFILLS ?What changed: See the new instructions. ?  ?hydrALAZINE 50 MG tablet ?Commonly known as: APRESOLINE ?Take 1 tablet (50 mg total) by mouth 3 (three) times daily. ?What changed:  ?medication strength ?how much  to take ?when to take this ?  ?isosorbide mononitrate 30 MG 24 hr tablet ?Commonly known as: IMDUR ?Take 1 tablet (30 mg total) by mouth daily. ?  ?levofloxacin 500 MG tablet ?Commonly known as: Levaquin ?Take 1 tablet (500 mg total) by mouth every other day for 3 doses. ?  ?methylPREDNISolone 4 MG Tbpk tablet ?Commonly known as: MEDROL DOSEPAK ?follow package directions ?  ?rosuvastatin 10 MG tablet ?Commonly known as: CRESTOR ?Take 1 tablet (10 mg total) by mouth daily. ?Start taking on: February 24, 2022 ?What changed:  ?medication strength ?how much to take ?  ?sennosides-docusate sodium 8.6-50 MG tablet ?Commonly known as: SENOKOT-S ?Take 3 tablets by mouth at bedtime. ?  ? ?  ? ?  ?  ? ? ?  ?Durable Medical Equipment  ?(From admission, onward)  ?  ? ? ?  ? ?  Start     Ordered  ? 02/23/22 1052  For home use only DME Walker rolling  Once       ?Question Answer Comment  ?Walker: With 5 Inch Wheels   ?Patient needs a walker to treat with the following condition Weakness   ?  ? 02/23/22 1052  ? ?  ?  ? ?  ? ? ? Follow-up Information   ? ? [name], MD. Schedule an appointment as soon as possible for a visit in 1 week(s).   ?Specialty: Pulmonary Disease ?Why: Post hospital follow appointment scheduled with PCP for ?Also follow-up with your nephrologist within a week. ?Contact information: ?[address] ?[address] ?[place] [address] ?[phone] ? ? ?  ?  ? ? Fay Records, MD .   ?Specialty: Cardiology ?Contact information: ?[address] ?[address] ?[place] [address] ?[phone] ? ? ?  ?  ? ?  ?  ? ?  ? ? ?Major procedures and Radiology Reports - PLEASE review detailed and final reports thoroughly  -    ? ? ?US  RENAL ? ?Result Date: 02/21/2022 ?CLINICAL DATA:  Acute kidney injury. EXAM: RENAL / URINARY TRACT ULTRASOUND COMPLETE COMPARISON:  09/15/2020 FINDINGS: Right Kidney: Renal measurements: 8.9 x 4.0 x 4.2 cm =

## 2022-02-23 NOTE — Discharge Instructions (Signed)
Follow with Primary MD [name], MD and with your nephrologist in 7 days  ? ?Get CBC, CMP, 2 view Chest X ray -  checked next visit within 1 week by Primary MD   ? ?Activity: As tolerated with Full fall precautions use walker/cane & assistance as needed ? ?Disposition Home  ? ?Diet: Heart Healthy with 1.5 L fluid restriction per day. ? ?Special Instructions: If you have smoked or chewed Tobacco  in the last 2 yrs please stop smoking, stop any regular Alcohol  and or any Recreational drug use. ? ?On your next visit with your primary care physician please Get Medicines reviewed and adjusted. ? ?Please request your Prim.MD to go over all Hospital Tests and Procedure/Radiological results at the follow up, please get all Hospital records sent to your Prim MD by signing hospital release before you go home. ? ?If you experience worsening of your admission symptoms, develop shortness of breath, life threatening emergency, suicidal or homicidal thoughts you must seek medical attention immediately by calling 911 or calling your MD immediately  if symptoms less severe. ? ?You Must read complete instructions/literature along with all the possible adverse reactions/side effects for all the Medicines you take and that have been prescribed to you. Take any new Medicines after you have completely understood and accpet all the possible adverse reactions/side effects.  ? ?  ? ?

## 2022-02-23 NOTE — TOC Transition Note (Signed)
Transition of Care (TOC) - CM/SW Discharge Note ? ? ?Patient Details  ?Name: Jillian [name] ?MRN: [id number] ?Date of Birth: 11/16/1935 ? ?Transition of Care [facility]) CM/SW Contact:  ?Sharin Mons, RN ?Phone Number: ?02/23/2022, 10:53 AM ? ? ?Clinical Narrative:    ?Patient will DC to: home ?Anticipated DC date: 02/23/2022 ?Family notified: yes ?Transport by: car ? ?Presented with SOB. Hx of chronic combined systolic and diastolic CHF, paroxysmal atrial fibrillation, history of CVA, hypertension  ?Per MD patient ready for DC today.  RN, patient, and patient's family notified of DC.     Pt states daughters will assist and care for her once d/c. Pt declines outpatient PT  services per PT's recommendations. States she will done fine with RW. ?Referral made with [employer] for RW.  ?Equipment will be delivered to bedside prior to d/c. ?Daughter will provide transportation to home. ?Post hospital f/u noted on AVS. ?Pt without Rx MEDS concerns. ? ?RNCM will sign off for now as intervention is no longer needed. Please consult [place] again if new needs arise.  ? ? ?  ?  ? ? ?Patient Goals and CMS Choice ?  ?  ?Choice offered to / list presented to : Patient ? ?Discharge Placement ?  ?           ?  ?  ?  ?  ? ?Discharge Plan and Services ?  ?  ?           ?DME Arranged: Walker rolling ?  ?Date DME Agency Contacted: 02/23/22 ?Time DME Agency Contacted: [phone] ?Representative spoke with at DME Agency: Maudie Mercury ?  ?  ?  ?  ?  ? ?Social Determinants of Health (SDOH) Interventions ?  ? ? ?Readmission Risk Interventions ?   ? View : No data to display.  ?  ?  ?  ? ? ? ? ? ?

## 2022-02-23 NOTE — Progress Notes (Signed)
Discharge summary packet provided to pt with instructions. Pt verbalized understanding of instructions.  No complaints. Pt remains alert/oriented in no apparent distress. D/C to home as ordered. Pt's daughter is responsible for her transportation. ?

## 2022-02-24 ENCOUNTER — Telehealth: Payer: Self-pay

## 2022-02-24 LAB — UREA NITROGEN, URINE: Urea Nitrogen, Ur: 700 mg/dL

## 2022-02-24 NOTE — Telephone Encounter (Signed)
Transition Care Management Unsuccessful Follow-up Telephone Call ? ?Date of discharge and from where:  02/23/2022, [facility]  ? ?Attempts:  1st Attempt ? ?Reason for unsuccessful TCM follow-up call:  Voice mail full # [phone]. This is also the number for patient's daughter, [name].  ? ?Need to discuss scheduling a hospital follow  up appointment ? ? ? ?

## 2022-02-25 ENCOUNTER — Telehealth: Payer: Self-pay

## 2022-02-25 NOTE — Telephone Encounter (Signed)
Transition Care Management Follow-up Telephone Call ? ?Call completed with patient's daughter, [name] ?Date of discharge and from where: 02/23/2022,[facility]  ?How have you been since you were released from the hospital? She said her mother is doing okay.  ?Any questions or concerns? No ? ?Items Reviewed: ?Did the pt receive and understand the discharge instructions provided? Yes  ?Medications obtained and verified? Yes  - her daughter said that she has all of her medications ?Other? No  ?Any new allergies since your discharge? No  ?Dietary orders reviewed? No ?Do you have support at home? Yes , support from her family ? ?Home Care and Equipment/Supplies: ?Were home health services ordered? no ?If so, what is the name of the agency? N/a  ?Has the agency set up a time to come to the patient's home? not applicable ?Were any new equipment or medical supplies ordered?  Yes: RW ?What is the name of the medical supply agency? [employer] ?Were you able to get the supplies/equipment? yes ?Do you have any questions related to the use of the equipment or supplies? No ? ?Functional Questionnaire: (I = Independent and D = Dependent) ?ADLs: has RW for ambulation.  Family provides support as needed ? ? ?Follow up appointments reviewed: ? ?PCP Hospital f/u appt confirmed? Yes  Scheduled to see Dr Joya Gaskins - 03/08/2022.  ?[facility] f/u appt confirmed?  None scheduled at this time   ?Are transportation arrangements needed? Yes  - her daughter will call patient's insurance company to inquire about rides to medical appointments.  Instructed her to call this clinic back if she is unable to secure a ride for her mother ?If their condition worsens, is the pt aware to call PCP or go to the Emergency Dept.? Yes ?Was the patient provided with contact information for the PCP's office or ED? Yes ?Was to pt encouraged to call back with questions or concerns? Yes ? ?

## 2022-03-02 DIAGNOSIS — D631 Anemia in chronic kidney disease: Secondary | ICD-10-CM | POA: Diagnosis not present

## 2022-03-02 DIAGNOSIS — I2510 Atherosclerotic heart disease of native coronary artery without angina pectoris: Secondary | ICD-10-CM | POA: Diagnosis not present

## 2022-03-02 DIAGNOSIS — I639 Cerebral infarction, unspecified: Secondary | ICD-10-CM | POA: Diagnosis not present

## 2022-03-02 DIAGNOSIS — I129 Hypertensive chronic kidney disease with stage 1 through stage 4 chronic kidney disease, or unspecified chronic kidney disease: Secondary | ICD-10-CM | POA: Diagnosis not present

## 2022-03-02 DIAGNOSIS — N184 Chronic kidney disease, stage 4 (severe): Secondary | ICD-10-CM | POA: Diagnosis not present

## 2022-03-07 NOTE — Progress Notes (Signed)
? ?Established Patient Office Visit ? ?Subjective:  ?Patient ID: Jillian [name], female    DOB: 1936/11/05  Age: 86 y.o. MRN: [id number] ? ?History of Present Illness: ?  ?CC: Posthospital visit ? ? ?HPI ?09/2021 ?Jillian [name] presents for post hospital visit which occurred in August of this year.  I have not seen this patient since June of this year.  Somehow we did not get her back in follow-up.  Patient lives at home with family support.  Transportation is a difficulty.  Note she is only having 1-2 bowel movements every 2 or 3 days.  She is out of her furosemide. ? ?Below is a copy of the discharge summary from August ? ? ?MRN: [id number] ?DOB: [birth year] 86 y.o. ?PCP: Jillian Stain, MD ?  ?Date of Admission: 07/07/2021 10:14 AM ?Date of Discharge: 8/24/202208/24/2022 ?Attending Physician: Lucious Groves  ?  ?Discharge Diagnosis: ?1. Acute on chronic HFpEF exacerbation ?2. Hypertension ?3. Hx of CAD  ?4. CKD IV ?5. Prediabetes  ?6. Hx of CVA  ?  ?1.  Acute HFpEF exacerbation: Patient discharged on Lasix 60mg  daily and potassium supplementation 42mEq daily. Continued on coreg 25mg  bid. On follow up, please ensure compliance with current regimen. Follow up BMP  ?  ?Hypertension: Continue telmisartan 40mg  daily.  ?  ?Hx of CAD: Continue rosuvastatin 20mg  daily  ?  ?CKD IV: Follow up with nephrology. Avoid nephrotoxic agents.  ?  ?Prediabetes: HbA1c 6.2. Continue to encourage lifestyle modifications ?  ?Hx of CVA: Concern for possible embolic CVA in the past. Continue Eliquis 2.5mg  bid  ?  ?2.  Labs / imaging needed at time of follow-up: BMP ?  ?3.  Pending labs/ test needing follow-up: None ?  ?It can rarely we get this patient in for laboratory and examination directly ? ? ?03/08/22 ?This is a transition of care visit at day 64, RN transition of care visit had already occurred on April 13 ?Patient comes today accompanied by her granddaughter for posthospital visit.  Patient was admitted between the seventh and 11  April with acute bronchitis and congestive heart failure.  She had edema on her chest x-ray.  X-ray showed this.  She did not have any positive cultures.  She was diuresed given steroids and antibiotics.  She is finished her course of antibiotics. ? ?Patient comes with her medication bag with her and I went through all the medications and had to do extensive medication reconciliation.  During the hospitalization they wanted to start her on BiDil but I do not see evidence for any of these prescriptions having been sent.  They asked her to hold her ARB which she has been doing but there is a bottle of this still in the medication bag.  Her creatinine had increased over 3 and she has follow-up with nephrology in the next 24 hours. ? ?Patient was also supposed to be on hydralazine 50 mg 3 times daily she has been taking this also supposed to be on carvedilol and she is supposed to be taking this and does have medicines for this.  She also has furosemide and Imdur. ? ?Patient states overall except for edema in the legs she is improving.  She declines to receive any vaccines at this visit. ?She is agreeable for labs and x-ray ? ?Below is a copy of the discharge summary ?PCP: Jillian Stain, MD ?  ?Admit date: 02/19/2022  Discharge date: 02/23/2022 ?  ?Admitted From: Home   Disposition:  Home ?  ?  ?  Recommendations for Outpatient Follow-up:  ?  ?Follow up with PCP in 1-2 weeks ?  ?PCP Please obtain BMP/CBC, 2 view CXR in 1week,  (see Discharge instructions)  ?  ?PCP Please follow up on the following pending results: Please monitor CBC, BMP closely, needs close outpatient follow-up with her nephrologist. ?  ?  ?Home Health: PT,OT   ?Equipment/Devices: as below  ?Consultations: None  ?Discharge Condition: Stable    ?CODE STATUS: Full    ?Diet Recommendation: Heart Healthy with 1.5 L fluid restriction per day. ?  ?  ?   ?Chief Complaint  ?Patient presents with  ? Shortness of Breath  ?  ?  ?Brief history of present illness  from the day of admission and additional interim summary   ?  ?86 y.o. female with history of chronic combined systolic and diastolic CHF, paroxysmal atrial fibrillation, history of CVA, hypertension presents to the ER because of worsening shortness of breath with productive cough and wheezing over the last 1 week, she had some exposure to sick contacts in the last week.  In the ER her work-up was suggestive of acute bronchitis and acute on chronic diastolic CHF and she was admitted. ?  ?                                                               Hospital Course  ?  ?Hypoxic respiratory failure requiring supplemental oxygen due to combination of acute bronchitis and acute on chronic diastolic CHF. She is currently admitted to the hospital, sputum Gram [name] culture has been ordered, will trend procalcitonin levels as well, chest x-ray noted.  Currently on Levaquin along with steroids, clinically continues to improve has been encouraged to sit up in chair and use I-S and flutter valve for pulmonary toiletry which I think will make a huge difference, nursing staff also requested to monitor on 02/22/2022. ?  ?2.  Acute on chronic diastolic CHF EF 26% in 7124.  Initially required some IV Lasix, continue Coreg, on ARB and switch to BiDil due to AKI and CKD. ?  ?3. AKI on CKD Stage IV.  ARB was held since admission, urine sodium under 10 suggestive dehydration continue IV fluids and monitor.  Acute renal ultrasound ?  ?4.  History of CVA.  Continue combination of statin and Eliquis for secondary prevention. ?  ?5.  Hypertensive urgency could have been due to shortness of breath, for now Coreg, BiDil and as needed hydralazine. ?  ?6.  History of paroxysmal atrial fibrillation with [place] vas 2 score of greater than 3.  On Coreg and Eliquis.  Will require Eliquis prescription upon discharge. ?  ?7.  Nonspecific chronic EKG changes.  Monitor clinically. ?  ?  ?Discharge diagnosis   ?  ?  ?Principal Problem: ?  Acute  bronchitis ?Active Problems: ?  CKD (chronic kidney disease) stage 4, GFR 15-29 ml/min (HCC) ?  Anemia due to stage 4 chronic kidney disease ([place]) ?  Chronic diastolic heart failure ([place]) ?  History of CVA (cerebrovascular accident) ? ?Patient does have follow-up with cardiology scheduled and notes she still on the Eliquis ?Past Medical History:  ?Diagnosis Date  ? Acute CVA (cerebrovascular accident) ([place]) 07/12/2020  ? Hypertension   ? Renal disorder   ? CKD  Stage IV  ? Stroke [facility])   ? ? ?Past Surgical History:  ?Procedure Laterality Date  ? CARDIAC CATHETERIZATION  2009  ? ? ?Family History  ?Problem Relation Age of Onset  ? CAD Mother   ? Breast cancer Mother   ? ? ?Social History  ? ?Socioeconomic History  ? Marital status: Unknown  ?  Spouse name: Not on file  ? Number of children: Not on file  ? Years of education: Not on file  ? Highest education level: Not on file  ?Occupational History  ? Not on file  ?Tobacco Use  ? Smoking status: Never  ? Smokeless tobacco: Never  ?Substance and Sexual Activity  ? Alcohol use: Not on file  ? Drug use: Not on file  ? Sexual activity: Not on file  ?Other Topics Concern  ? Not on file  ?Social History Narrative  ? Not on file  ? ?Social Determinants of Health  ? ?Financial Resource Strain: Not on file  ?Food Insecurity: Not on file  ?Transportation Needs: Not on file  ?Physical Activity: Not on file  ?Stress: Not on file  ?Social Connections: Not on file  ?Intimate Partner Violence: Not on file  ? ? ?Outpatient Medications Prior to Visit  ?Medication Sig Dispense Refill  ? apixaban (ELIQUIS) 2.5 MG TABS tablet Take 1 tablet (2.5 mg total) by mouth 2 (two) times daily. 60 tablet 6  ? carvedilol (COREG) 25 MG tablet Take 1 tablet (25 mg total) by mouth 2 (two) times daily. 60 tablet 6  ? desoximetasone (TOPICORT) 0.25 % cream Apply 1 application topically 2 (two) times daily. 30 g 0  ? furosemide (LASIX) 20 MG tablet TAKE 3 TABLETS BY MOUTH ONCE DAILY KEEP  DECEMBER  APPT   WITH  CARDIOLOGIST  FOR  FURTHER  REFILLS (Patient taking differently: Take 60 mg by mouth in the morning.) 270 tablet 3  ? hydrALAZINE (APRESOLINE) 50 MG tablet Take 1 tablet (50 mg total) by mouth 3 (three) time

## 2022-03-08 ENCOUNTER — Ambulatory Visit
Admission: RE | Admit: 2022-03-08 | Discharge: 2022-03-08 | Disposition: A | Discharge status: Home / Self Care | Payer: Medicare Other | Source: Ambulatory Visit | Attending: Critical Care Medicine | Admitting: Critical Care Medicine

## 2022-03-08 ENCOUNTER — Encounter: Payer: Self-pay | Admitting: Critical Care Medicine

## 2022-03-08 ENCOUNTER — Ambulatory Visit: Payer: Medicare Other | Attending: Critical Care Medicine | Admitting: Critical Care Medicine

## 2022-03-08 VITALS — BP 196/72 | HR 66 | Resp 18 | Ht 61.0 in | Wt 180.0 lb

## 2022-03-08 DIAGNOSIS — D631 Anemia in chronic kidney disease: Secondary | ICD-10-CM | POA: Diagnosis not present

## 2022-03-08 DIAGNOSIS — I5032 Chronic diastolic (congestive) heart failure: Secondary | ICD-10-CM

## 2022-03-08 DIAGNOSIS — I669 Occlusion and stenosis of unspecified cerebral artery: Secondary | ICD-10-CM | POA: Diagnosis not present

## 2022-03-08 DIAGNOSIS — I129 Hypertensive chronic kidney disease with stage 1 through stage 4 chronic kidney disease, or unspecified chronic kidney disease: Secondary | ICD-10-CM

## 2022-03-08 DIAGNOSIS — E782 Mixed hyperlipidemia: Secondary | ICD-10-CM | POA: Diagnosis not present

## 2022-03-08 DIAGNOSIS — J209 Acute bronchitis, unspecified: Secondary | ICD-10-CM

## 2022-03-08 DIAGNOSIS — I10 Essential (primary) hypertension: Secondary | ICD-10-CM

## 2022-03-08 DIAGNOSIS — N184 Chronic kidney disease, stage 4 (severe): Secondary | ICD-10-CM

## 2022-03-08 DIAGNOSIS — J40 Bronchitis, not specified as acute or chronic: Secondary | ICD-10-CM | POA: Diagnosis not present

## 2022-03-08 DIAGNOSIS — Z6836 Body mass index (BMI) 36.0-36.9, adult: Secondary | ICD-10-CM

## 2022-03-08 NOTE — Assessment & Plan Note (Signed)
Patient with progressive renal failure has follow-up with nephrology ?

## 2022-03-08 NOTE — Assessment & Plan Note (Signed)
Blood pressure remains quite elevated at this time I did a complete medication reconciliation and asked her to resume hydralazine 50 mg 3 times daily, she is also to continue the carvedilol twice daily at 25 mg. ? ?We need to hold the ARB because of worsening renal failure.  She cannot stand or tolerate Aldactone.  We cannot give amlodipine.  The patient is on isosorbide and might be a good candidate for BiDil. ? ?Patient has follow-up with cardiology and nephrology will defer to those services with regards to her complexity of care ? ?bmet will be obtained today ?

## 2022-03-08 NOTE — Assessment & Plan Note (Signed)
Patient does have Rova statin we will continue current dose ?

## 2022-03-08 NOTE — Assessment & Plan Note (Signed)
Follow-up blood counts ?

## 2022-03-08 NOTE — Assessment & Plan Note (Signed)
This has resolved she has not completed her prednisone we will discard the remaining dosings of prednisone ?

## 2022-03-08 NOTE — Patient Instructions (Signed)
Get Chest xray on way out downstairs ? ?Go to lab for lab draw ? ?No change in medications ? ?Keep cardiology and kidney appointments ? ?Return Dr Joya Gaskins 2 months ?

## 2022-03-08 NOTE — Assessment & Plan Note (Signed)
Recent exacerbation of heart failure continue furosemide as prescribed ?

## 2022-03-09 ENCOUNTER — Other Ambulatory Visit: Payer: Self-pay | Admitting: Critical Care Medicine

## 2022-03-09 LAB — CBC WITH DIFFERENTIAL/PLATELET
Basophils Absolute: 0 10*3/uL (ref 0.0–0.2)
Basos: 1 %
EOS (ABSOLUTE): 0.1 10*3/uL (ref 0.0–0.4)
Eos: 2 %
Hematocrit: 31.7 % — ABNORMAL LOW (ref 34.0–46.6)
Hemoglobin: 10.2 g/dL — ABNORMAL LOW (ref 11.1–15.9)
Immature Grans (Abs): 0.1 10*3/uL (ref 0.0–0.1)
Immature Granulocytes: 1 %
Lymphocytes Absolute: 2.3 10*3/uL (ref 0.7–3.1)
Lymphs: 35 %
MCH: 27.6 pg (ref 26.6–33.0)
MCHC: 32.2 g/dL (ref 31.5–35.7)
MCV: 86 fL (ref 79–97)
Monocytes Absolute: 0.7 10*3/uL (ref 0.1–0.9)
Monocytes: 10 %
Neutrophils Absolute: 3.4 10*3/uL (ref 1.4–7.0)
Neutrophils: 51 %
Platelets: 225 10*3/uL (ref 150–450)
RBC: 3.7 x10E6/uL — ABNORMAL LOW (ref 3.77–5.28)
RDW: 13 % (ref 11.7–15.4)
WBC: 6.6 10*3/uL (ref 3.4–10.8)

## 2022-03-09 LAB — BASIC METABOLIC PANEL
BUN/Creatinine Ratio: 18 (ref 12–28)
BUN: 39 mg/dL — ABNORMAL HIGH (ref 8–27)
CO2: 26 mmol/L (ref 20–29)
Calcium: 8.9 mg/dL (ref 8.7–10.3)
Chloride: 105 mmol/L (ref 96–106)
Creatinine, Ser: 2.2 mg/dL — ABNORMAL HIGH (ref 0.57–1.00)
Glucose: 105 mg/dL — ABNORMAL HIGH (ref 70–99)
Potassium: 5 mmol/L (ref 3.5–5.2)
Sodium: 145 mmol/L — ABNORMAL HIGH (ref 134–144)
eGFR: 22 mL/min/{1.73_m2} — ABNORMAL LOW (ref >59)

## 2022-03-09 MED ORDER — ISOSORBIDE MONONITRATE ER 30 MG PO TB24: 30.0000 mg | ORAL_TABLET | Freq: Every day | ORAL | 2 refills | Status: DC

## 2022-03-09 MED ORDER — TELMISARTAN 80 MG PO TABS: 80.0000 mg | ORAL_TABLET | Freq: Every day | ORAL | 2 refills | Status: DC

## 2022-03-09 MED ORDER — HYDRALAZINE HCL 50 MG PO TABS: 50.0000 mg | ORAL_TABLET | Freq: Three times a day (TID) | ORAL | 2 refills | Status: DC

## 2022-03-09 MED ORDER — CARVEDILOL 25 MG PO TABS: 25.0000 mg | ORAL_TABLET | Freq: Two times a day (BID) | ORAL | 6 refills | Status: DC

## 2022-03-22 ENCOUNTER — Emergency Department (HOSPITAL_COMMUNITY)
Admission: EM | Admit: 2022-03-22 | Discharge: 2022-03-23 | Disposition: A | Discharge status: Home / Self Care | Payer: Medicare Other | Attending: Emergency Medicine | Admitting: Emergency Medicine

## 2022-03-22 ENCOUNTER — Encounter (HOSPITAL_COMMUNITY): Payer: Self-pay

## 2022-03-22 ENCOUNTER — Other Ambulatory Visit: Payer: Self-pay

## 2022-03-22 DIAGNOSIS — I130 Hypertensive heart and chronic kidney disease with heart failure and stage 1 through stage 4 chronic kidney disease, or unspecified chronic kidney disease: Secondary | ICD-10-CM | POA: Insufficient documentation

## 2022-03-22 DIAGNOSIS — Z7901 Long term (current) use of anticoagulants: Secondary | ICD-10-CM | POA: Diagnosis not present

## 2022-03-22 DIAGNOSIS — I10 Essential (primary) hypertension: Secondary | ICD-10-CM | POA: Diagnosis not present

## 2022-03-22 DIAGNOSIS — Z8673 Personal history of transient ischemic attack (TIA), and cerebral infarction without residual deficits: Secondary | ICD-10-CM | POA: Insufficient documentation

## 2022-03-22 DIAGNOSIS — Z91040 Latex allergy status: Secondary | ICD-10-CM | POA: Diagnosis not present

## 2022-03-22 DIAGNOSIS — N184 Chronic kidney disease, stage 4 (severe): Secondary | ICD-10-CM | POA: Insufficient documentation

## 2022-03-22 DIAGNOSIS — I509 Heart failure, unspecified: Secondary | ICD-10-CM | POA: Diagnosis not present

## 2022-03-22 DIAGNOSIS — R040 Epistaxis: Secondary | ICD-10-CM | POA: Diagnosis not present

## 2022-03-22 DIAGNOSIS — R6889 Other general symptoms and signs: Secondary | ICD-10-CM | POA: Diagnosis not present

## 2022-03-22 DIAGNOSIS — Z79899 Other long term (current) drug therapy: Secondary | ICD-10-CM | POA: Insufficient documentation

## 2022-03-22 DIAGNOSIS — Z743 Need for continuous supervision: Secondary | ICD-10-CM | POA: Diagnosis not present

## 2022-03-22 DIAGNOSIS — R030 Elevated blood-pressure reading, without diagnosis of hypertension: Secondary | ICD-10-CM

## 2022-03-22 LAB — CBC
HCT: 33.4 % — ABNORMAL LOW (ref 36.0–46.0)
Hemoglobin: 10.2 g/dL — ABNORMAL LOW (ref 12.0–15.0)
MCH: 27.4 pg (ref 26.0–34.0)
MCHC: 30.5 g/dL (ref 30.0–36.0)
MCV: 89.8 fL (ref 80.0–100.0)
Platelets: 225 10*3/uL (ref 150–400)
RBC: 3.72 MIL/uL — ABNORMAL LOW (ref 3.87–5.11)
RDW: 14.3 % (ref 11.5–15.5)
WBC: 5.9 10*3/uL (ref 4.0–10.5)
nRBC: 0 % (ref 0.0–0.2)

## 2022-03-22 LAB — BASIC METABOLIC PANEL
Anion gap: 6 (ref 5–15)
BUN: 29 mg/dL — ABNORMAL HIGH (ref 8–23)
CO2: 27 mmol/L (ref 22–32)
Calcium: 8.5 mg/dL — ABNORMAL LOW (ref 8.9–10.3)
Chloride: 108 mmol/L (ref 98–111)
Creatinine, Ser: 2.42 mg/dL — ABNORMAL HIGH (ref 0.44–1.00)
GFR, Estimated: 19 mL/min — ABNORMAL LOW (ref >60)
Glucose, Bld: 102 mg/dL — ABNORMAL HIGH (ref 70–99)
Potassium: 3.7 mmol/L (ref 3.5–5.1)
Sodium: 141 mmol/L (ref 135–145)

## 2022-03-22 IMAGING — DX DG CHEST 1V PORT
1 series · 1 of 1 positions shown · non-contrast
Comparison: None.

CLINICAL DATA: 84-year-old female with shortness of breath.

EXAM:
PORTABLE CHEST 1 VIEW

[chest]
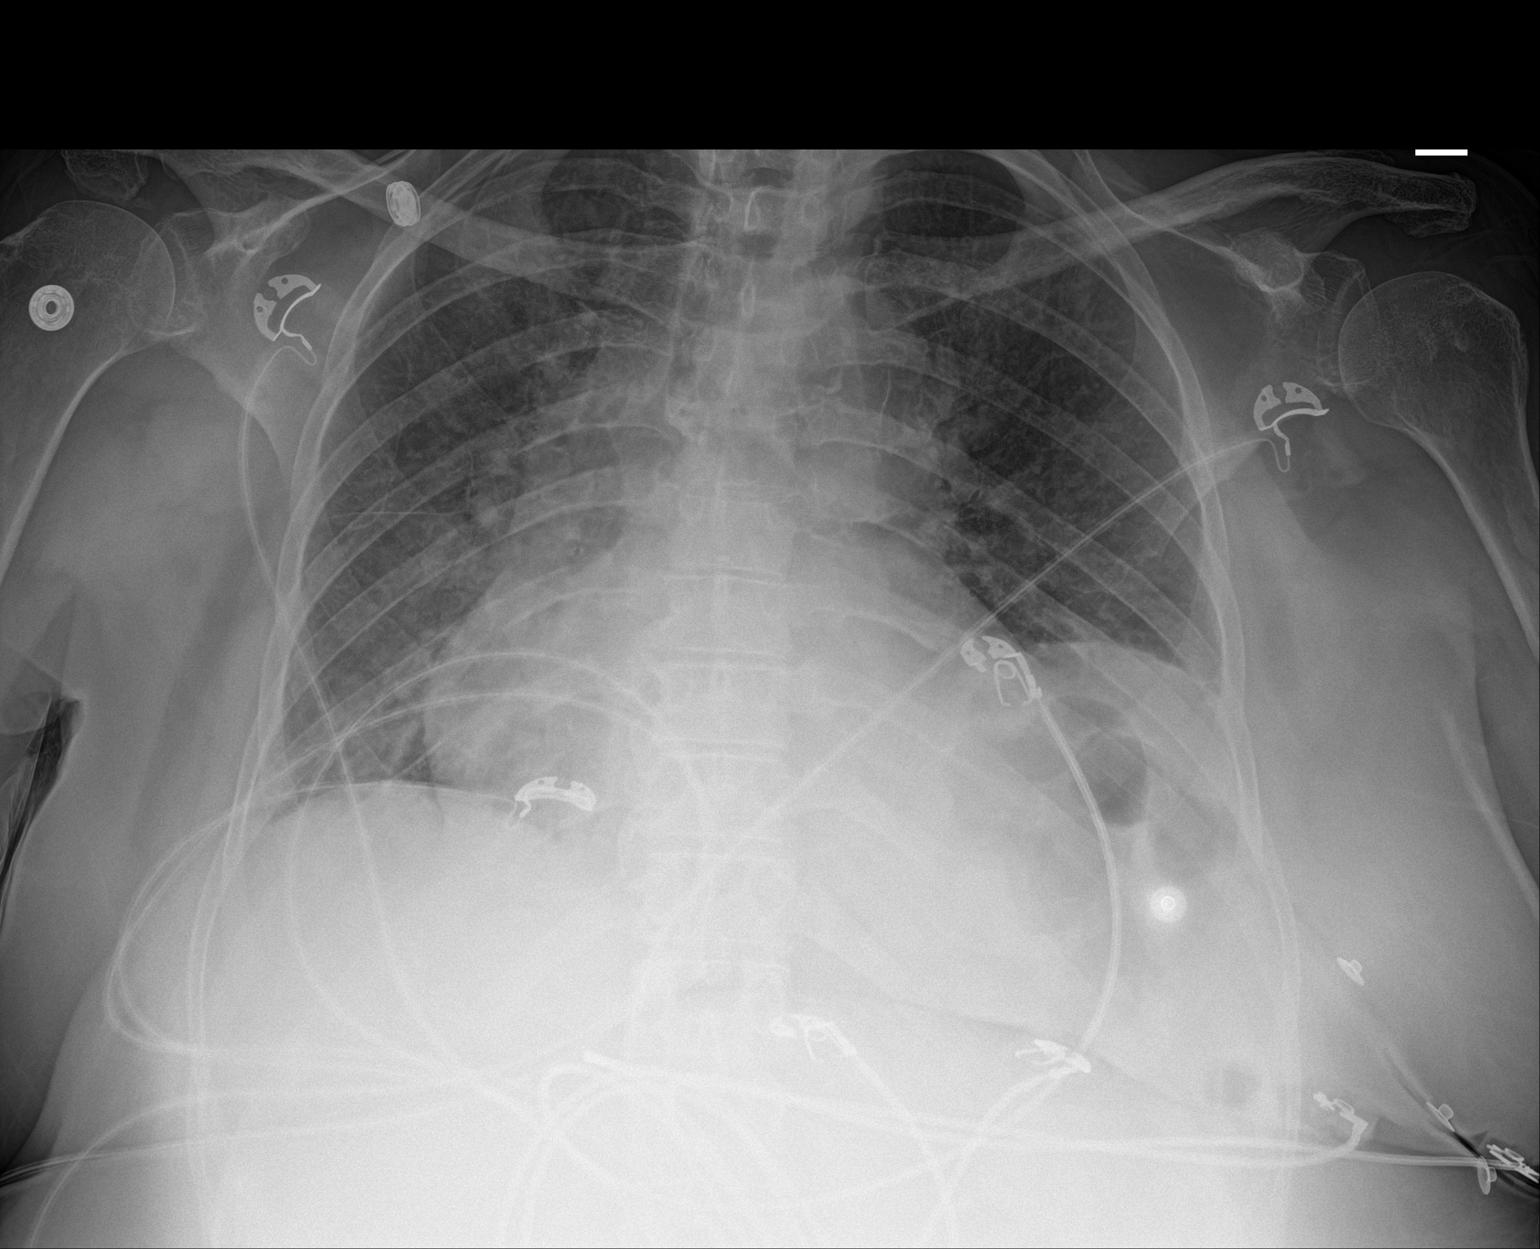

[1 of 1 positions shown; findings below may reference images not displayed]

FINDINGS: There is cardiomegaly with mild vascular congestion and mild edema.
There is eventration of the left hemidiaphragm. No focal
consolidation, pleural effusion, or pneumothorax. No acute osseous
pathology.
IMPRESSION: Cardiomegaly with findings of CHF.  No focal consolidation.

## 2022-03-22 IMAGING — CT CT HEAD W/O CM
3 series · 14 of 47 positions shown, 16 images · non-contrast
Comparison: None.

CLINICAL DATA: 84-year-old female with transient ischemic attack.

EXAM:
CT HEAD WITHOUT CONTRAST
TECHNIQUE: Contiguous axial images were obtained from the base of the skull
through the vertex without intravenous contrast.

[Series 3: head 5.0 h30s · axial · 0.41mm/px · z∈[-220,-85]mm · 8 of 33 slices shown, 10 images]
[im 3/33  brain]
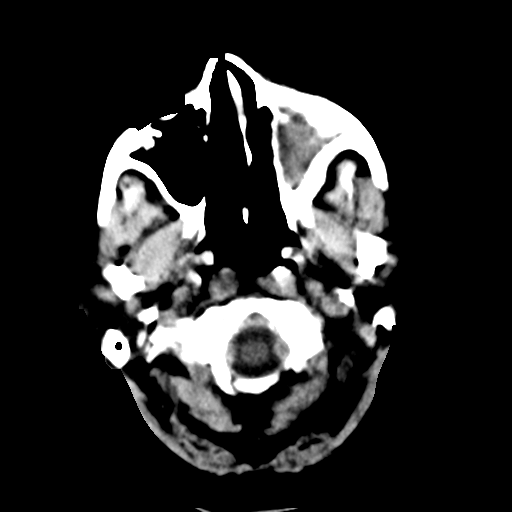
[im 3/33  bone]
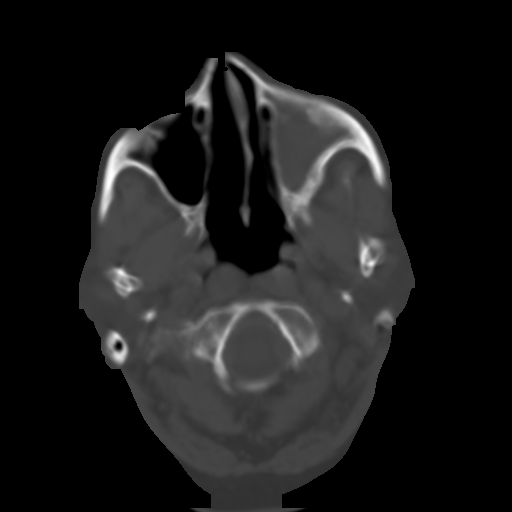
[im 7/33  brain]
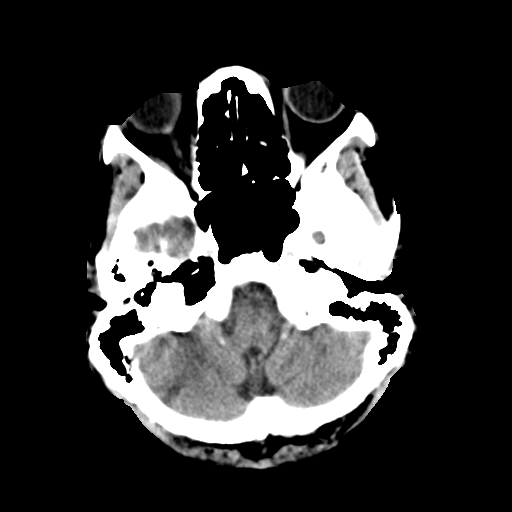
[im 10/33  brain]
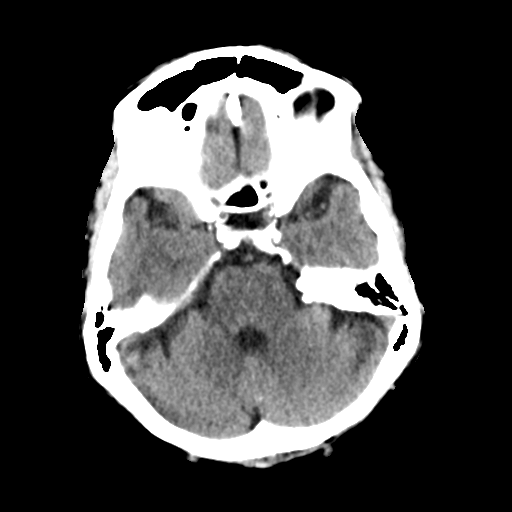
[im 15/33  brain]
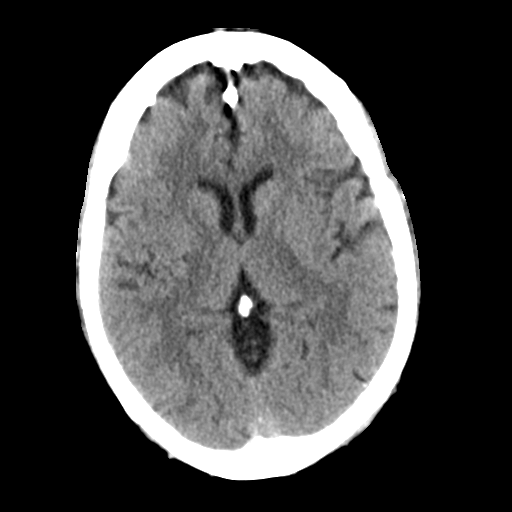
[im 18/33  brain]
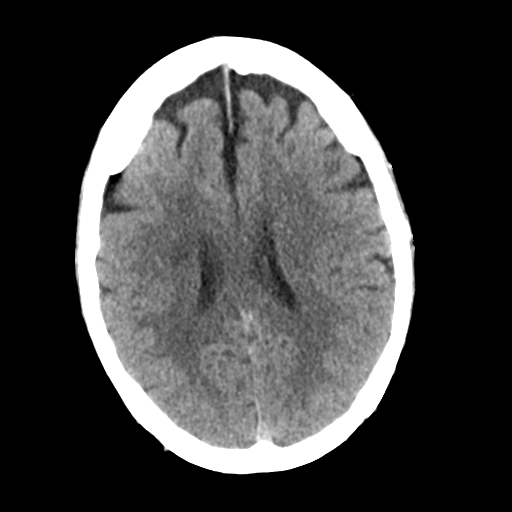
[im 18/33  bone]
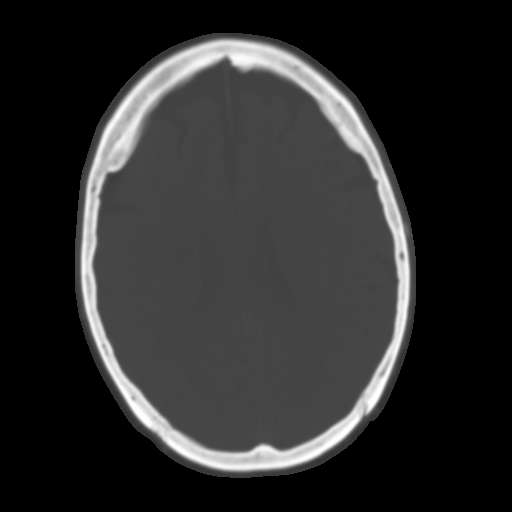
[im 23/33  brain]
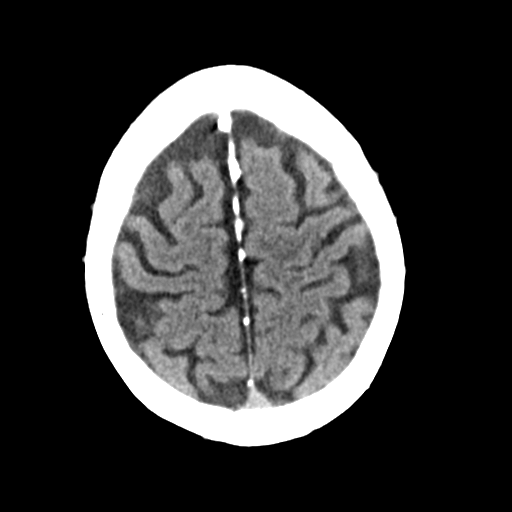
[im 26/33  brain]
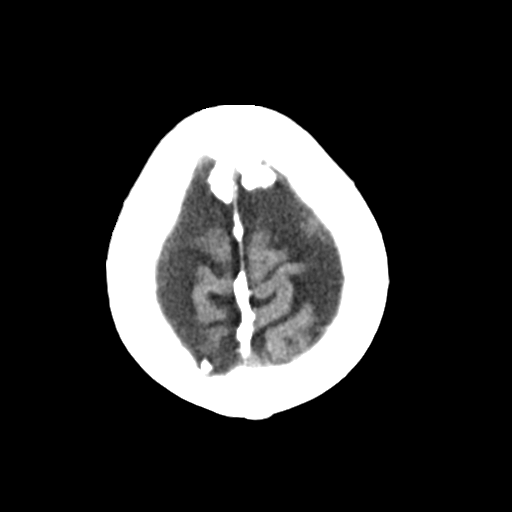
[im 30/33  brain]
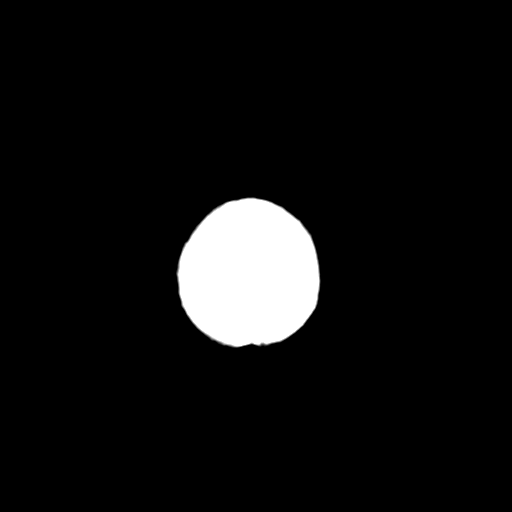

[Series 5: head 3.0 mpr cor · coronal · 0.33mm/px · 3 of 71 slices shown]
[im 25/71  brain]
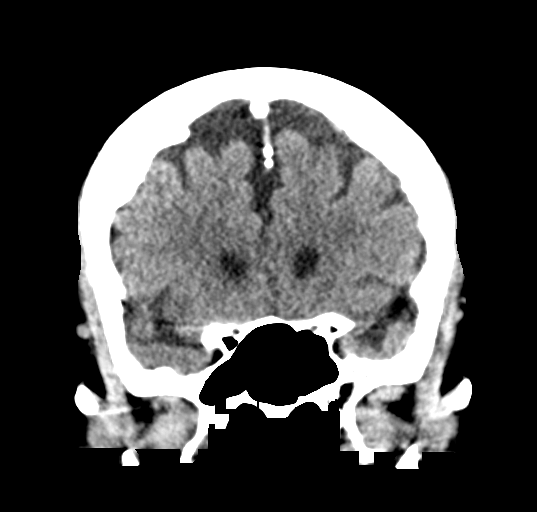
[im 32/71  brain]
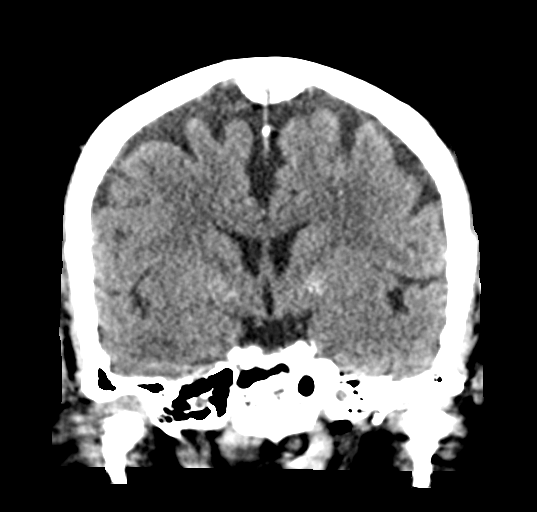
[im 39/71  brain]
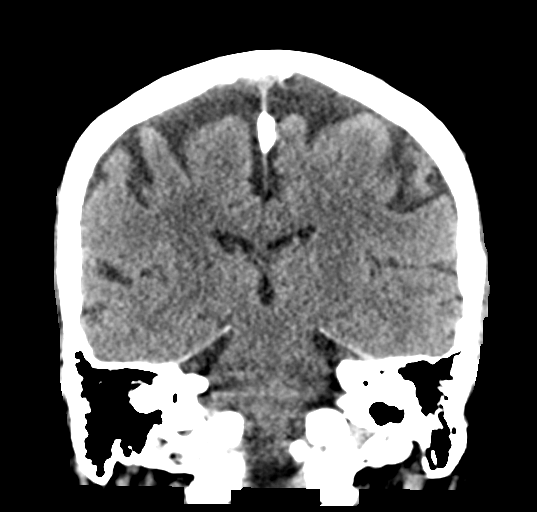

[Series 6: head 3.0 mpr sag · sagittal · 0.31mm/px · 3 of 63 slices shown]
[im 21/63  brain]
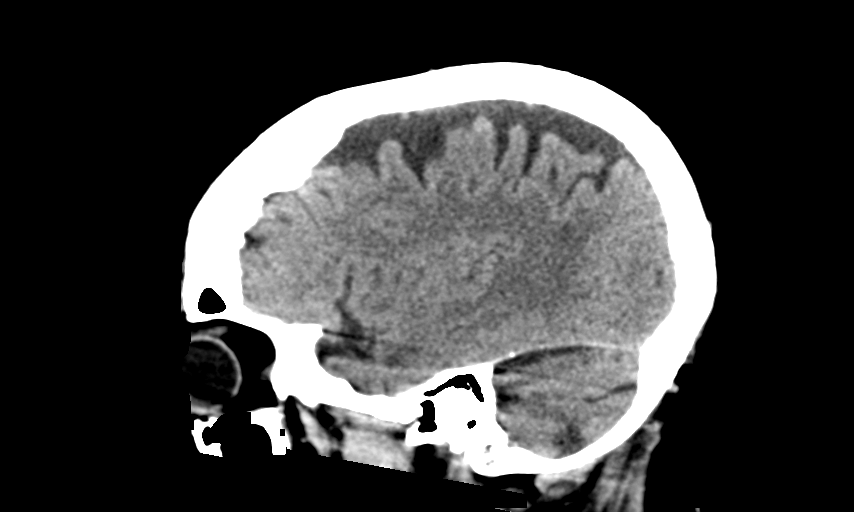
[im 32/63  brain]
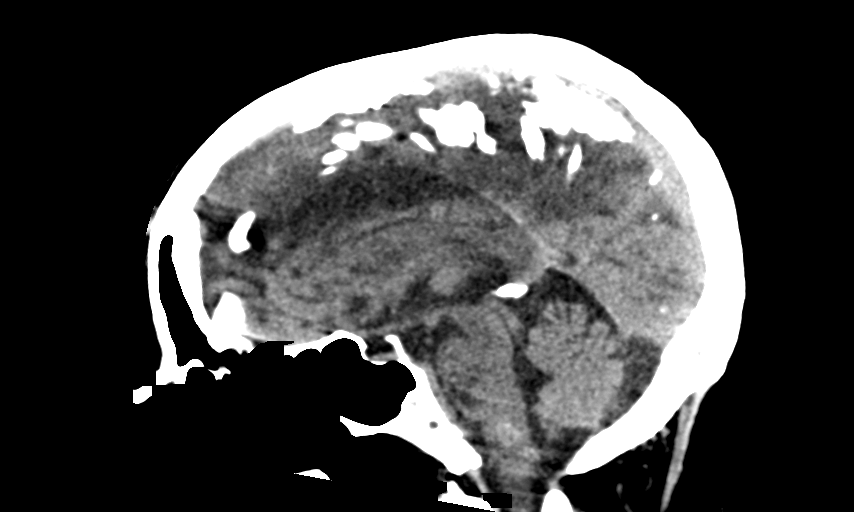
[im 42/63  brain]
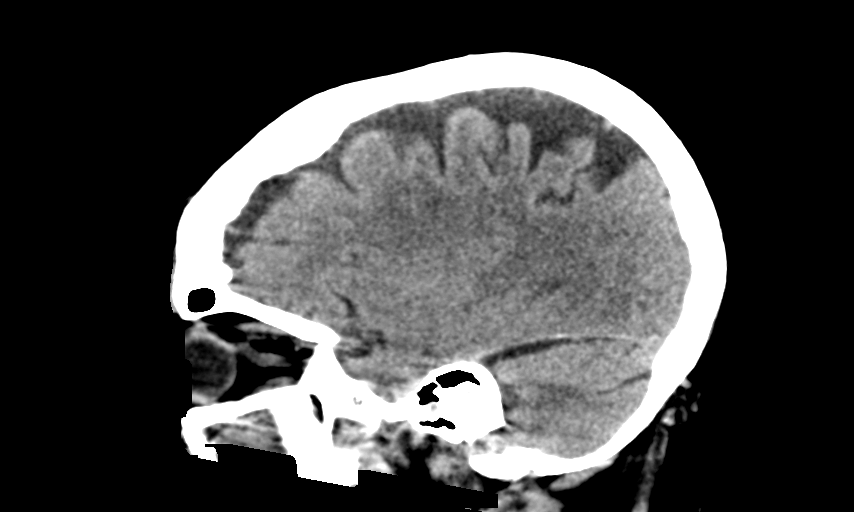

[14 of 47 positions shown; findings below may reference images not displayed]

FINDINGS: Brain: There is moderate age-related atrophy and chronic
microvascular ischemic changes. Probable focal area of old infarct
involving the right cerebellar hemisphere. Additional area of old
infarct involving the right periventricular white matter and left
frontal cortex. There is no acute intracranial hemorrhage. No mass
effect or midline shift. Bilateral low attenuating subdural fluid
over the convexities may be related to volume loss or represent old
hygroma.

Vascular: No hyperdense vessel or unexpected calcification.

Skull: Normal. Negative for fracture or focal lesion.

Sinuses/Orbits: There is complete opacification of the left
maxillary sinus. The remainder of the visualized paranasal sinuses
and mastoid air cells are clear.

Other: None
IMPRESSION: 1. No acute intracranial hemorrhage.
2. Moderate age-related atrophy and chronic microvascular ischemic
changes. Areas of old infarct.

## 2022-03-22 MED ORDER — HYDRALAZINE HCL 25 MG PO TABS
50.0000 mg | ORAL_TABLET | Freq: Once | ORAL | Status: AC
  Administered 2022-03-22: 50 mg via ORAL
  Filled 2022-03-22: qty 2

## 2022-03-22 MED ORDER — OXYMETAZOLINE HCL 0.05 % NA SOLN
1.0000 | Freq: Once | NASAL | Status: AC
Start: 2022-03-22 — End: 2022-03-22
  Administered 2022-03-22: 1 via NASAL
  Filled 2022-03-22: qty 30

## 2022-03-22 MED ORDER — HYDRALAZINE HCL 20 MG/ML IJ SOLN: 10.0000 mg | Freq: Once | INTRAMUSCULAR | Status: DC

## 2022-03-22 NOTE — ED Provider Notes (Signed)
?[facility] ?Provider Note ? ? ?CSN: [id number] ?Arrival date & time: 03/22/22  2149 ? ?  ? ?History ? ?Chief Complaint  ?Patient presents with  ? Epistaxis  ?  Patient arrives via EMS from home due to a nose bleed. Per patient she had a nose bleed around 8 pm that last an hour. Patient is on eliquis. No nose bleed on arrival to ED. Per EMS patient is very hypertensive. BP 248/180, HR 96 spo2 100% on room air. Patient denies HA dizziniess CP or SOB.   ? ? ?Jillian Genson is a 86 y.o. female. ? ?HPI ?86 year old female with a history of CKD stage IV, anemia, CHF, CVA, hypertension presents to the ER with complaints of epistaxis which has now resolved upon arrival to the ER.  She reports that she had a nosebleed for 2 hours without stopping and was having sinus pressure and felt like it was running down the back of her throat.  She has no other complaints at this time.  She is on Eliquis.  She was noted to be hypertensive with EMS with a blood pressure of 101 systolic and she was brought to the ER for further evaluation.  She denies any headache, chest pain, blurry vision, shortness of breath, abdominal pain or any other symptoms at this time. ?  ? ?Home Medications ?Prior to Admission medications   ?Medication Sig Start Date End Date Taking? Authorizing Provider  ?apixaban (ELIQUIS) 2.5 MG TABS tablet Take 1 tablet (2.5 mg total) by mouth 2 (two) times daily. 01/11/22   Swinyer, Lanice Schwab, NP  ?carvedilol (COREG) 25 MG tablet Take 1 tablet (25 mg total) by mouth 2 (two) times daily. 03/09/22 06/07/22  [name], MD  ?desoximetasone (TOPICORT) 0.25 % cream Apply 1 application topically 2 (two) times daily. 01/05/21   [name], MD  ?furosemide (LASIX) 20 MG tablet TAKE 3 TABLETS BY MOUTH ONCE DAILY KEEP  DECEMBER  APPT  WITH  CARDIOLOGIST  FOR  FURTHER  REFILLS ?Patient taking differently: Take 60 mg by mouth in the morning. 01/08/22   Fay Records, MD  ?hydrALAZINE  (APRESOLINE) 50 MG tablet Take 1 tablet (50 mg total) by mouth 3 (three) times daily. 03/09/22 04/08/22  [name], MD  ?isosorbide mononitrate (IMDUR) 30 MG 24 hr tablet Take 1 tablet (30 mg total) by mouth daily. 03/09/22 04/08/22  [name], MD  ?rosuvastatin (CRESTOR) 10 MG tablet Take 1 tablet (10 mg total) by mouth daily. 02/24/22   Thurnell Lose, MD  ?sennosides-docusate sodium (SENOKOT-S) 8.6-50 MG tablet Take 3 tablets by mouth at bedtime. 10/01/21   [name], MD  ?telmisartan (MICARDIS) 80 MG tablet Take 1 tablet (80 mg total) by mouth daily. 03/09/22   [name], MD  ?   ? ?Allergies    ?Latex, Penicillins, Tape, Aspirin, Atorvastatin, and Diltiazem hcl   ? ?Review of Systems   ?Review of Systems ?Ten systems reviewed and are negative for acute change, except as noted in the HPI.  ? ?Physical Exam ?Updated Vital Signs ?BP (!) 181/83   Pulse 69   Temp 98.6 ?F (37 ?C) (Oral)   Resp 17   Ht 5\' 1"  (1.549 m)   Wt 81.6 kg   SpO2 98%   BMI 34.01 kg/m?  ?Physical Exam ?Vitals and nursing note reviewed.  ?Constitutional:   ?   General: She is not in acute distress. ?   Appearance: She  is well-developed.  ?HENT:  ?   Head: Normocephalic and atraumatic.  ?   Nose:  ?   Comments: No evidence of retained blood products in posterior nares.  Oropharynx patent with no evidence of bleeding ?Eyes:  ?   Conjunctiva/sclera: Conjunctivae normal.  ?Cardiovascular:  ?   Rate and Rhythm: Normal rate and regular rhythm.  ?   Heart sounds: No murmur heard. ?Pulmonary:  ?   Effort: Pulmonary effort is normal. No respiratory distress.  ?   Breath sounds: Normal breath sounds.  ?Abdominal:  ?   Palpations: Abdomen is soft.  ?   Tenderness: There is no abdominal tenderness.  ?Musculoskeletal:     ?   General: Swelling present. No tenderness.  ?   Cervical back: Neck supple.  ?   Comments: Trace edema to bilateral lower extremities  ?Skin: ?   General: Skin is warm and dry.  ?   Capillary Refill:  Capillary refill takes less than 2 seconds.  ?Neurological:  ?   General: No focal deficit present.  ?   Mental Status: She is alert and oriented to person, place, and time.  ?Psychiatric:     ?   Mood and Affect: Mood normal.  ? ? ?ED Results / Procedures / Treatments   ?Labs ?(all labs ordered are listed, but only abnormal results are displayed) ?Labs Reviewed  ?CBC - Abnormal; Notable for the following components:  ?    Result Value  ? RBC 3.72 (*)   ? Hemoglobin 10.2 (*)   ? HCT 33.4 (*)   ? All other components within normal limits  ?BASIC METABOLIC PANEL - Abnormal; Notable for the following components:  ? Glucose, Bld 102 (*)   ? BUN 29 (*)   ? Creatinine, Ser 2.42 (*)   ? Calcium 8.5 (*)   ? GFR, Estimated 19 (*)   ? All other components within normal limits  ? ? ?EKG ?None ? ?Radiology ?No results found. ? ?Procedures ?Procedures  ? ? ?Medications Ordered in ED ?Medications  ?oxymetazoline (AFRIN) 0.05 % nasal spray 1 spray (1 spray Each Nare Given 03/22/22 2334)  ?hydrALAZINE (APRESOLINE) tablet 50 mg (50 mg Oral Given 03/22/22 2239)  ? ? ?ED Course/ Medical Decision Making/ A&P ?  ?                        ?Medical Decision Making ?Amount and/or Complexity of Data Reviewed ?Labs: ordered. ? ?Risk ?OTC drugs. ?Prescription drug management. ? ? ?86 year old female presents to the ER with concerns for epistaxis has now resolved.  On arrival, she had a blood pressure of 218/86, she denied any headache, nausea, vomiting, blurry vision, chest pain, shortness of breath, abdominal pain, or any other signs of concern for hypertensive urgency or emergency.  Per chart review, she had a post hospital admission visit in April 24 and was noted to have a blood pressure of 196/72.  She was instructed to take her hydralazine 50 mg 3 times daily as well as carvedilol.  She reports compliance with her medications but states that she does not have any blood pressure medications to take at night.  I question if she is taking her  blood pressure medicines correctly.  She has no evidence of retained blood products on exam and is no longer bleeding.  I did CBC which I interpreted to check her hemoglobin which was 10.2 and appears to be a baseline.  Her BMP shows a  creatinine of 2.42 which is also about her baseline.  She has no other significant abnormalities.  She has no chest pain and thus do not think any imaging or EKG is indicated.  She was given some Afrin spray and a dose of her home hydralazine, blood pressure improved to 181/83.  Daughter updated and encouraged her to make sure she is taking her medications correctly.   Return precautions discussed.  Stable for discharge. Stable for discharge with close follow-up with PCP. ? ? ?This was a shared visit with my supervising physician Dr. Tomi Bamberger who independently saw and evaluated the patient & provided guidance in evaluation/management/disposition ,in agreement with care ? ? ?Final Clinical Impression(s) / ED Diagnoses ?Final diagnoses:  ?Epistaxis  ?Elevated blood pressure reading  ? ? ?Rx / DC Orders ?ED Discharge Orders   ? ? None  ? ?  ? ? ?  ?Garald Balding, PA-C ?03/23/22 0007 ? ?  ?Dorie Rank, MD ?03/25/22 1314 ? ?

## 2022-03-22 NOTE — ED Triage Notes (Signed)
Patient reports that she is on a blood thinner and her nose has been bleeding for 2 hours without stopping/ also having sinus pressure and reports that it's running down the back of her throat. Patient reports that the bleed has stopped coming out of her nose during triage but still running down her throat.  ?

## 2022-03-23 NOTE — ED Notes (Signed)
All discharge instructions including follow up care reviewed with patient and patient verbalized understanding of same. Patient stable at time of discharge and was provided with a wheel chair prior to leaving the department. Patient's grandson provided patient with safe ride home.  ?

## 2022-03-23 NOTE — Discharge Instructions (Addendum)
You were evaluated in the Emergency Department and after careful evaluation, we did not find any emergent condition requiring admission or further testing in the hospital. ? ?Please use the Afrin as needed for nosebleed.  Your blood pressure was very elevated today, please make sure to take your medications as directed and follow-up closely with your primary care doctor. ? ?Please return to the Emergency Department if you experience any worsening of your condition.  Thank you for allowing [place] to be a part of your care. ? ?

## 2022-03-24 IMAGING — MR MR HEAD W/O CM
9 of 10 series · 38 of 48 positions shown · non-contrast
Comparison: Head CT 2 days ago.

CLINICAL DATA: TIA.  Slurred speech.

EXAM:
MRI HEAD WITHOUT CONTRAST
TECHNIQUE: Multiplanar, multiecho pulse sequences of the brain and surrounding
structures were obtained without intravenous contrast.

[Series 3: DWI · axial · 3.0mm · 1.09mm/px · z∈[-71,+64]mm · 8 of 92 slices shown (1 of 4)]
[im 1/92]
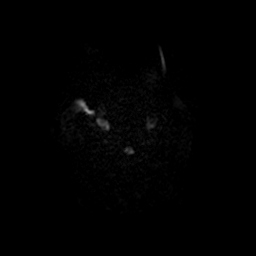
[im 11/92]
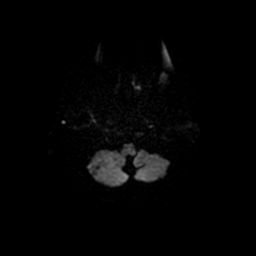
[im 31/92]
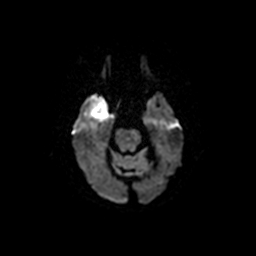
[im 41/92]
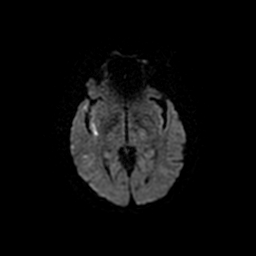
[im 51/92]
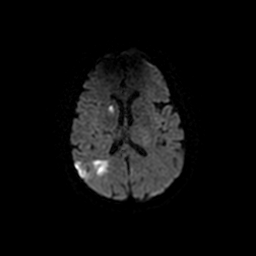
[im 61/92]
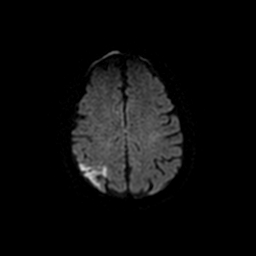
[im 81/92]
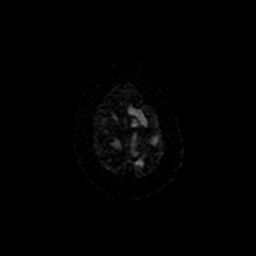
[im 92/92]
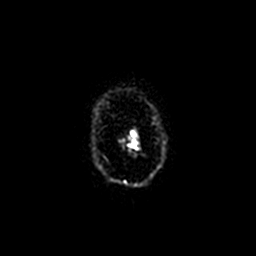

[Series 4: DWI · coronal · 5.0mm · 1.09mm/px · 8 of 72 slices shown (2 of 4)]
[im 1/72]
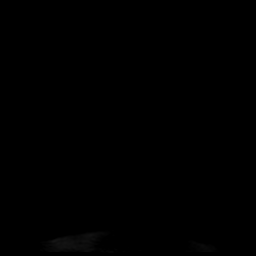
[im 11/72]
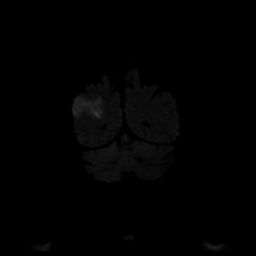
[im 21/72]
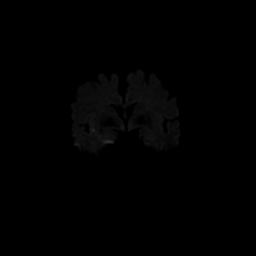
[im 31/72]
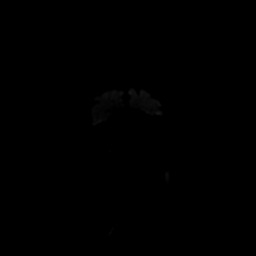
[im 41/72]
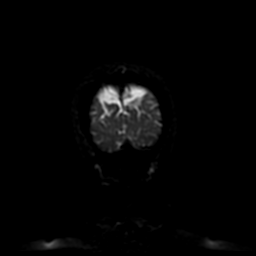
[im 51/72]
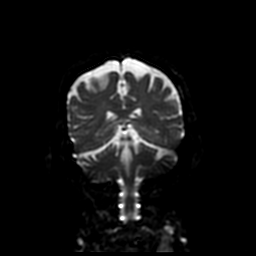
[im 61/72]
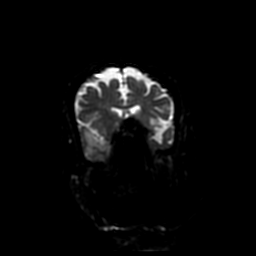
[im 72/72]
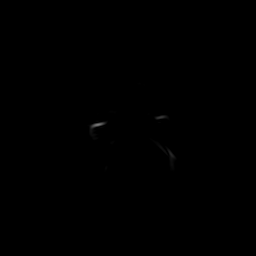

[Series 5: FLAIR · axial · 3.0mm · 0.43mm/px · z∈[-67,+65]mm · 2 of 23 slices shown]
[im 1/23]
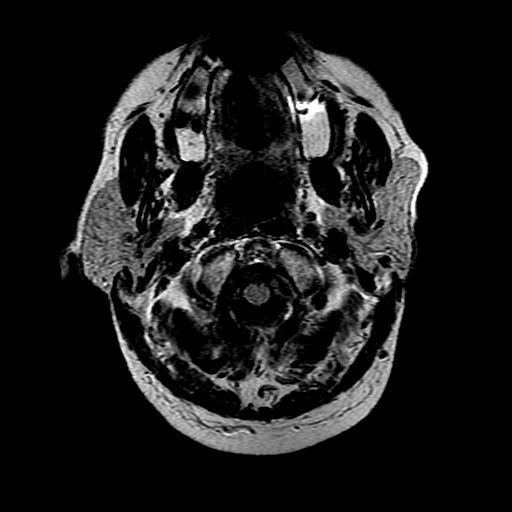
[im 23/23]
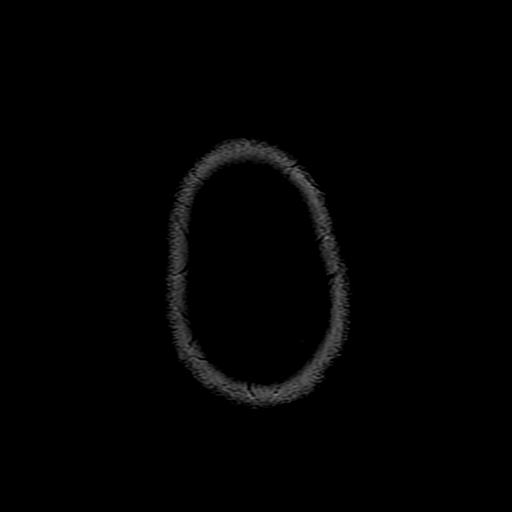

[Series 6: T1 · sagittal · 5.0mm · 0.47mm/px · 2 of 23 slices shown (1 of 2)]
[im 1/23]
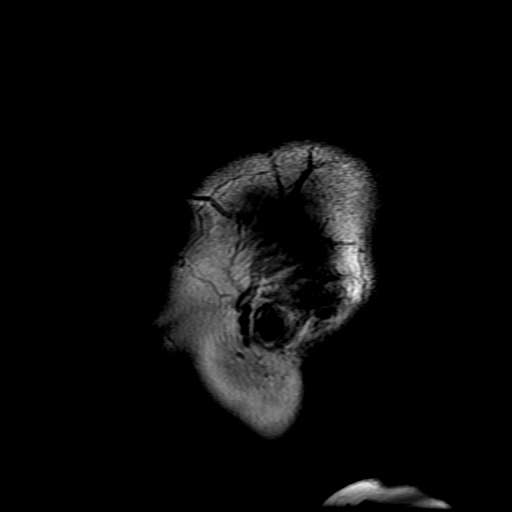
[im 23/23]
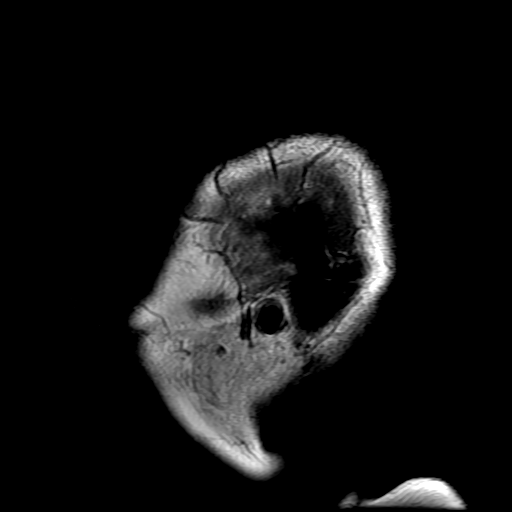

[Series 7: T2 · axial · 5.0mm · 0.43mm/px · z∈[-67,+65]mm · 2 of 23 slices shown (1 of 2)]
[im 1/23]
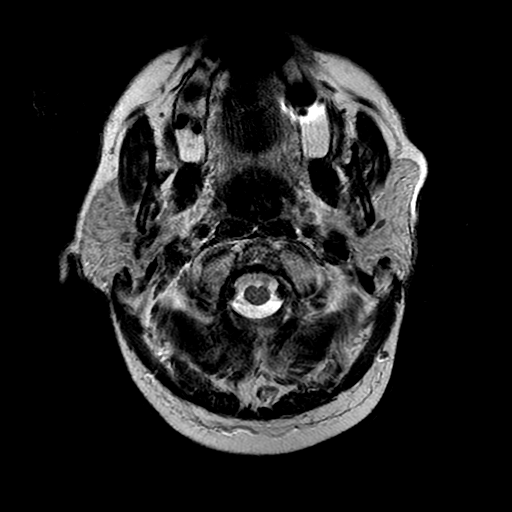
[im 23/23]
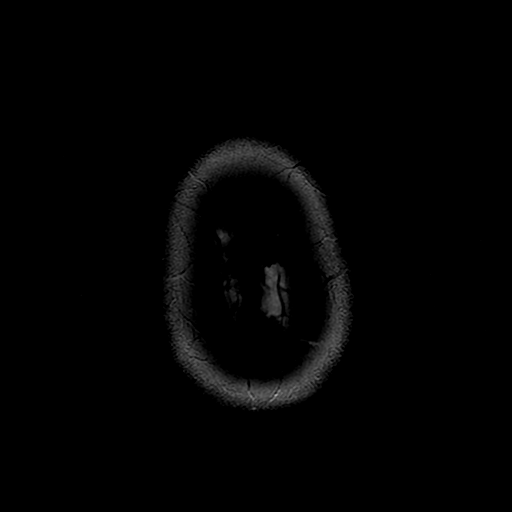

[Series 9: T1 · axial · 3.0mm · 0.47mm/px · z∈[-68,-8]mm · 4 of 92 slices shown (2 of 2)]
[im 1/92]
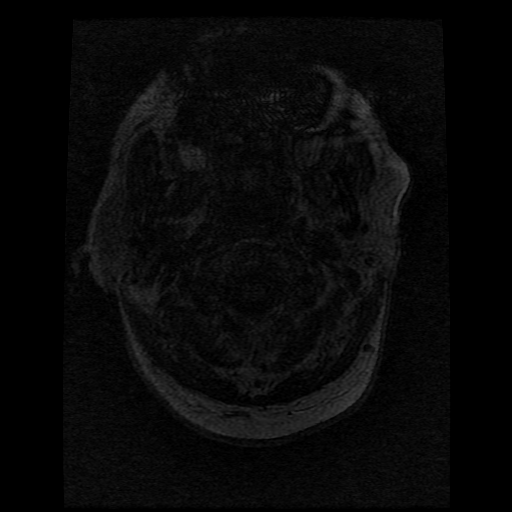
[im 11/92]
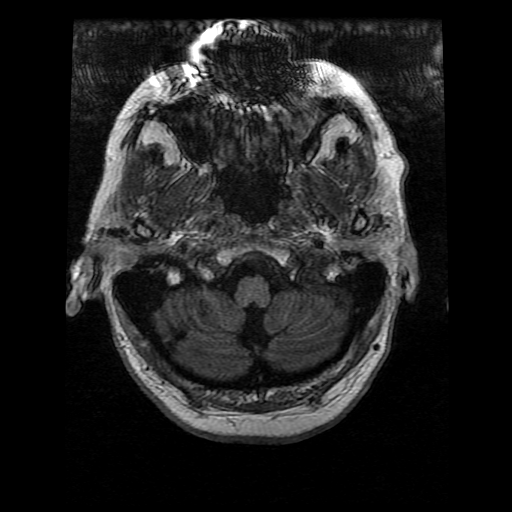
[im 31/92]
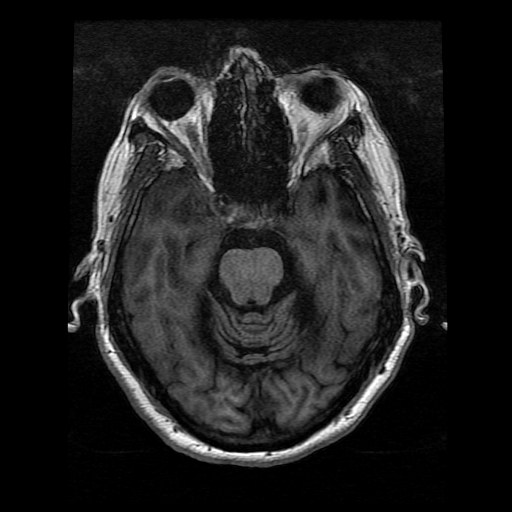
[im 41/92]
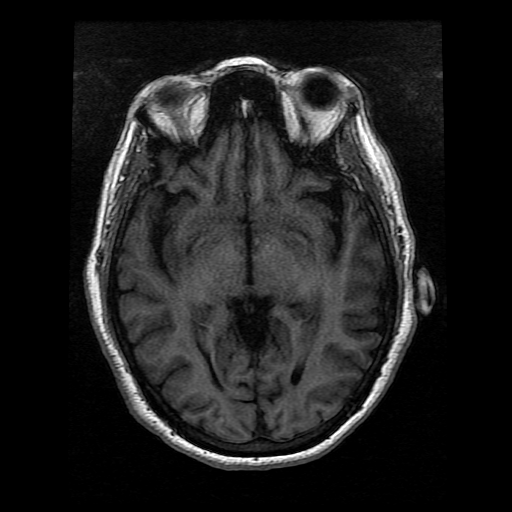

[Series 10: T2 · coronal · 5.0mm · 0.78mm/px · 3 of 28 slices shown (2 of 2)]
[im 1/28]
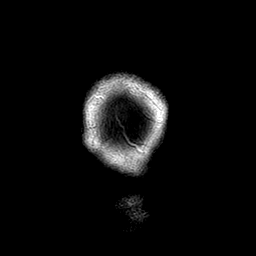
[im 14/28]
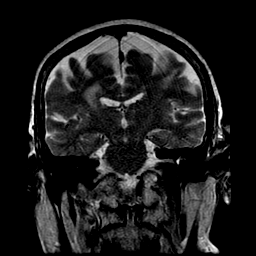
[im 28/28]
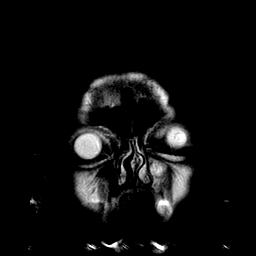

[Series 300: DWI · axial · 3.0mm · 1.09mm/px · z∈[-71,+64]mm · 5 of 46 slices shown (3 of 4)]
[im 1/46]
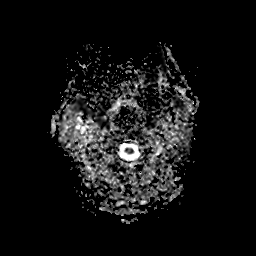
[im 12/46]
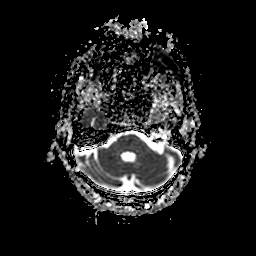
[im 23/46]
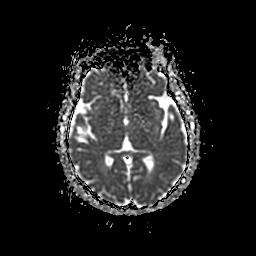
[im 34/46]
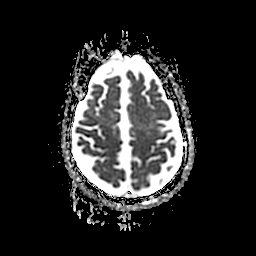
[im 46/46]
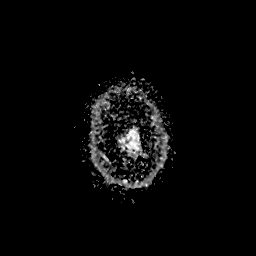

[Series 400: DWI · coronal · 5.0mm · 1.09mm/px · 4 of 36 slices shown (4 of 4)]
[im 1/36]
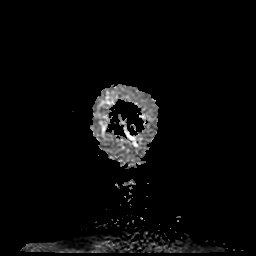
[im 12/36]
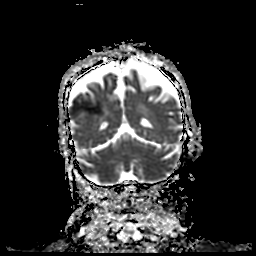
[im 24/36]
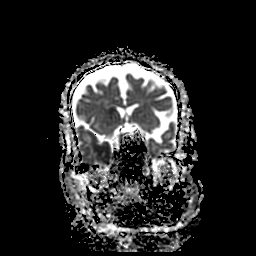
[im 36/36]
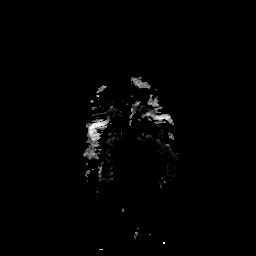

[38 of 48 positions shown; findings below may reference images not displayed]

FINDINGS: Brain: Numerous acute infarctions in the right hemisphere scattered
within the right middle cerebral artery territory. This includes the
anterior temporal lobe, areas within the insula. The right parietal
cortical and subcortical brain, right caudate head and a few
scattered punctate infarctions within the white matter. The areas of
infarction show mild swelling but there is no evidence of hemorrhage
or mass effect. Elsewhere, the brainstem appears normal. Old small
vessel infarctions in the inferior right cerebellum. Old small
vessel infarctions affect the right lateral thalamus and radiating
white matter tracts enter seen throughout the cerebral hemispheric
deep and subcortical white matter. Old left frontoparietal junction
cortical infarction. No mass lesion, hydrocephalus or extra-axial
collection

Vascular: Major vessels at the base of the brain show flow.

Skull and upper cervical spine: Negative

Sinuses/Orbits: Mucosal inflammatory changes of the left maxillary
sinus. Other sinuses clear. Orbits negative.

Other: None
IMPRESSION: 1. Numerous acute infarctions scattered within the right hemisphere
within the right middle cerebral artery territory. Mild swelling but
no hemorrhage or mass effect.
2. Extensive old ischemic changes elsewhere throughout the brain as
outlined above.

## 2022-03-26 IMAGING — MR MR MRA HEAD W/O CM
1 series · 20 of 48 positions shown · non-contrast
Comparison: None.

CLINICAL DATA: Stroke, follow-up

EXAM:
MRA HEAD WITHOUT CONTRAST
TECHNIQUE: Angiographic images of the Circle of Willis were obtained using MRA
technique without intravenous contrast.

[Series 5: 3d cow · axial · 0.5mm · 0.41mm/px · z∈[-112,-37]mm · 20 of 172 slices shown]
[im 1/172]
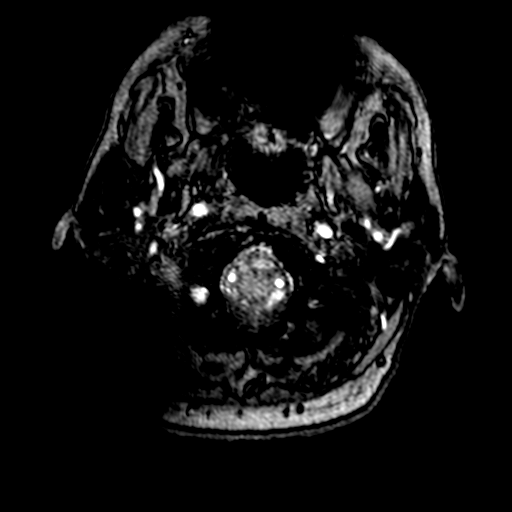
[im 4/172]
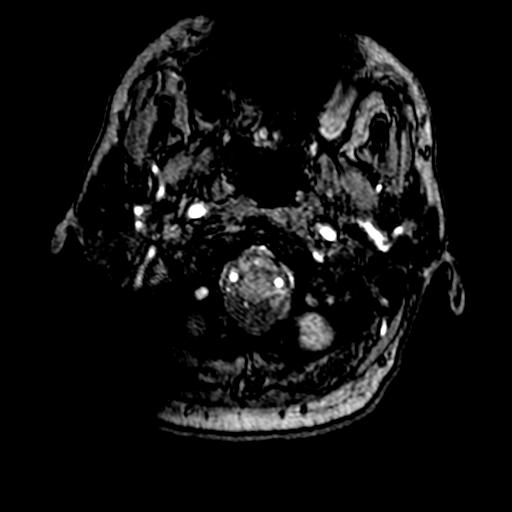
[im 8/172]
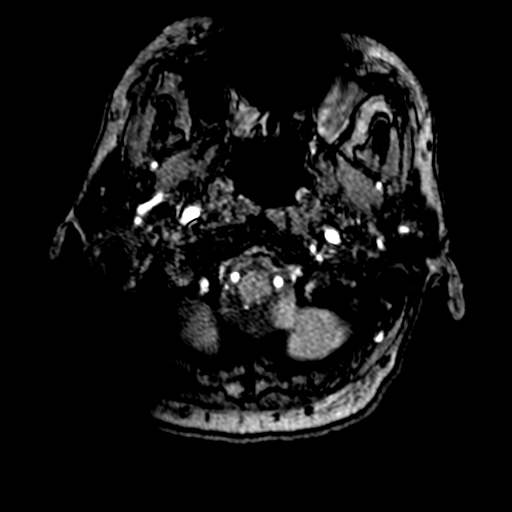
[im 11/172]
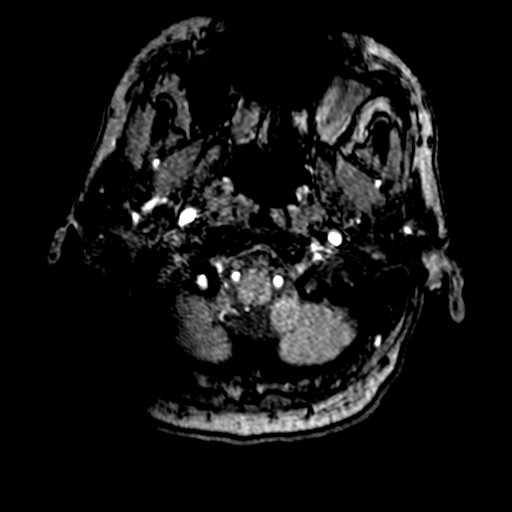
[im 15/172]
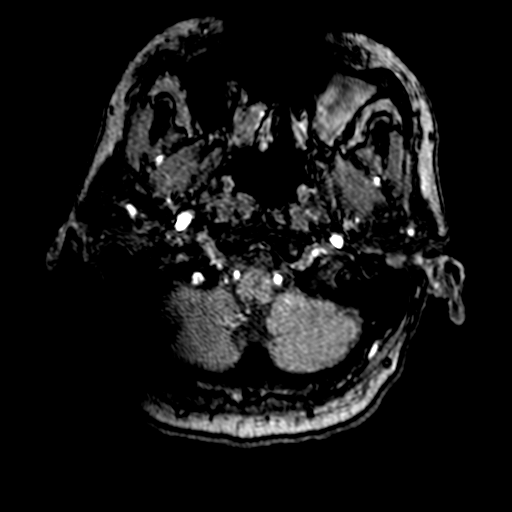
[im 19/172]
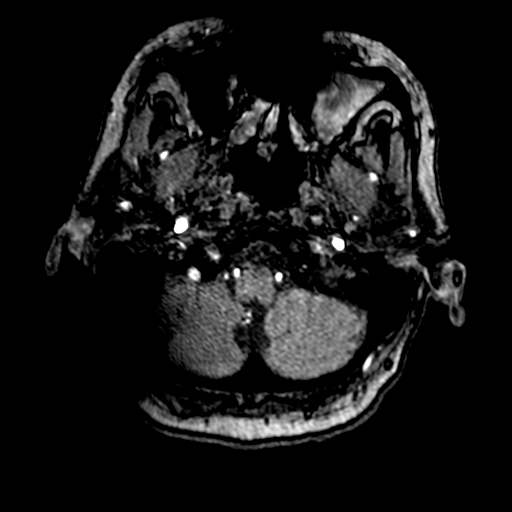
[im 22/172]
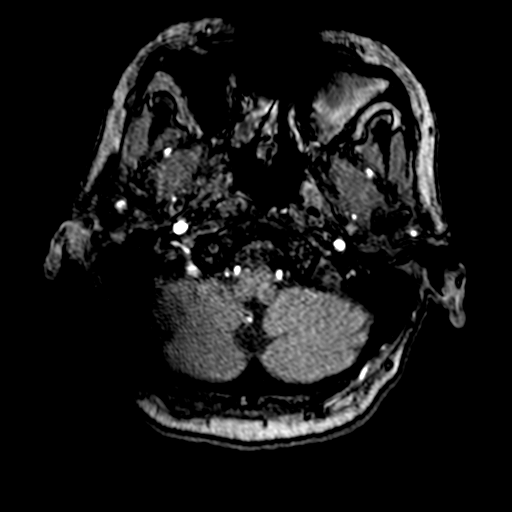
[im 26/172]
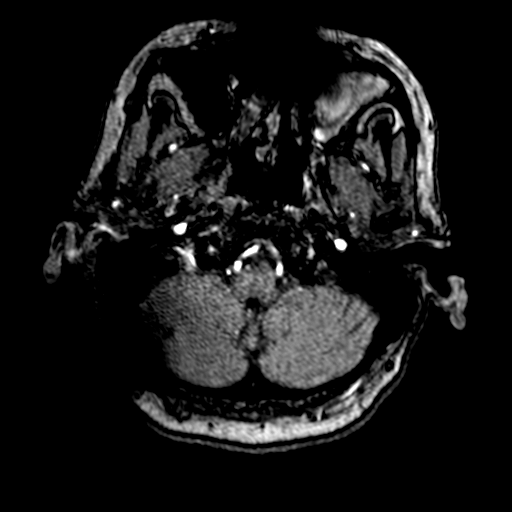
[im 30/172]
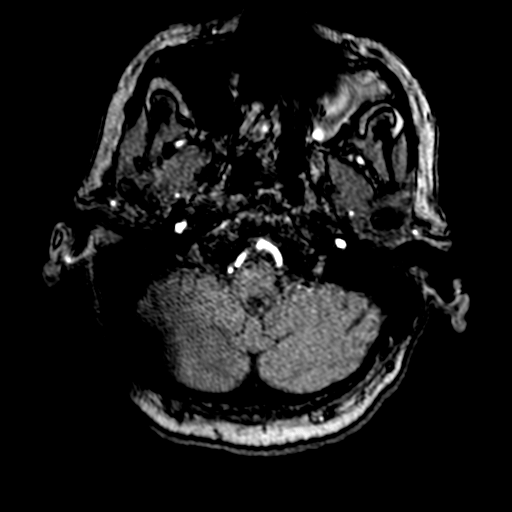
[im 33/172]
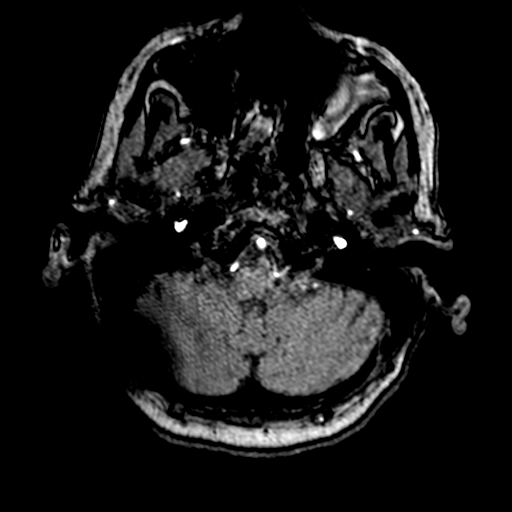
[im 37/172]
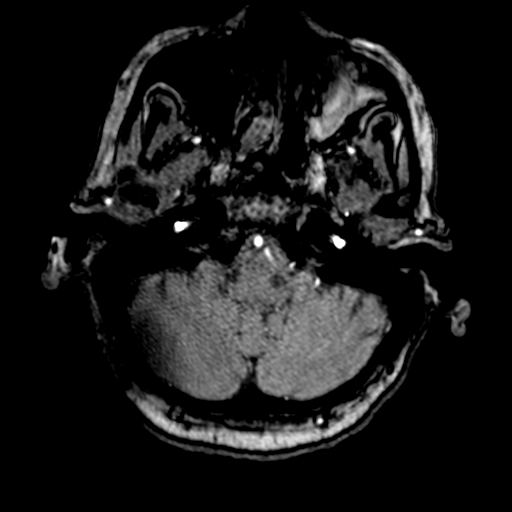
[im 41/172]
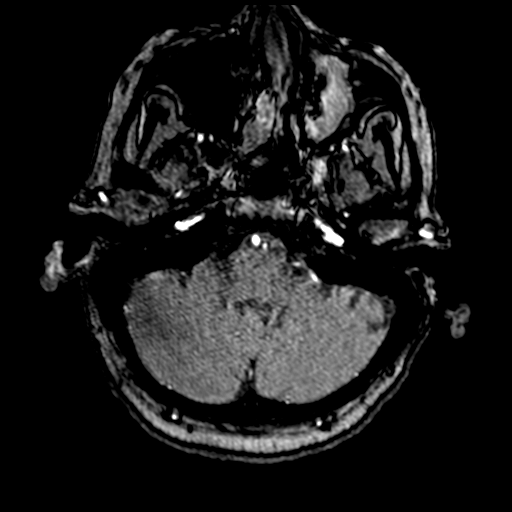
[im 55/172]
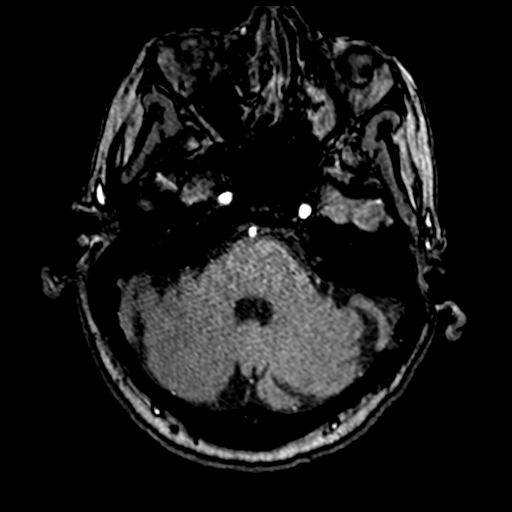
[im 77/172]
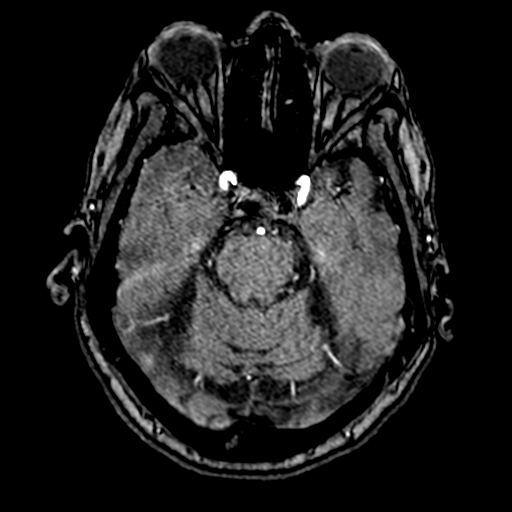
[im 88/172]
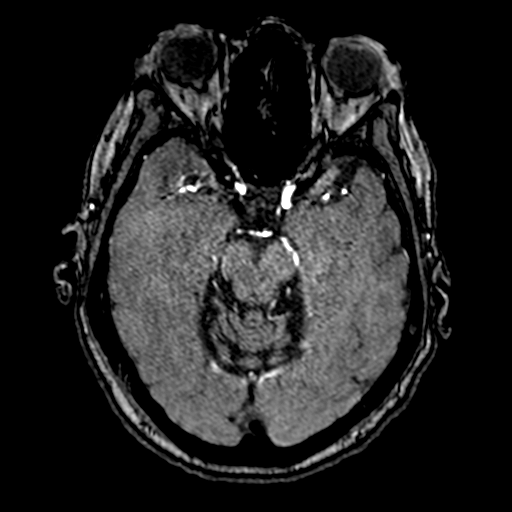
[im 99/172]
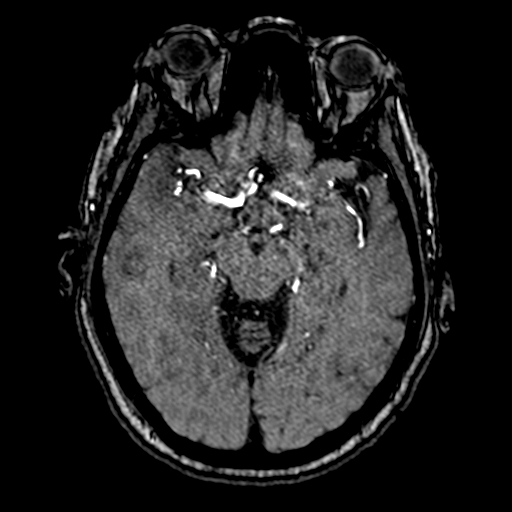
[im 121/172]
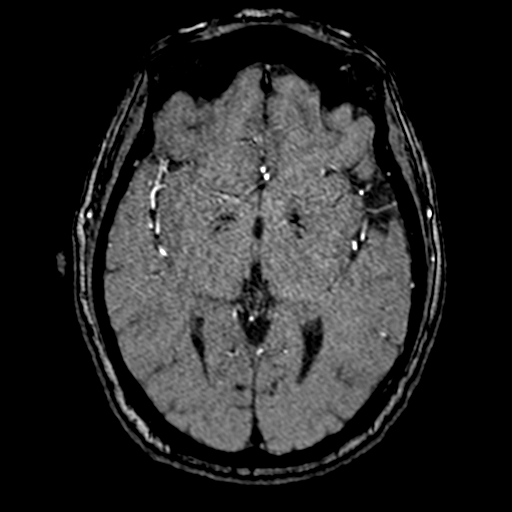
[im 142/172]
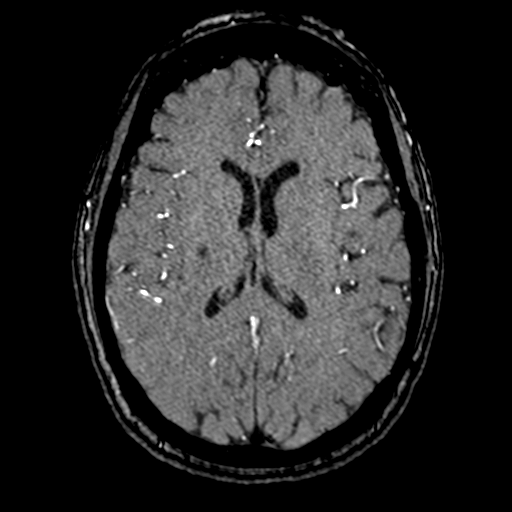
[im 146/172]
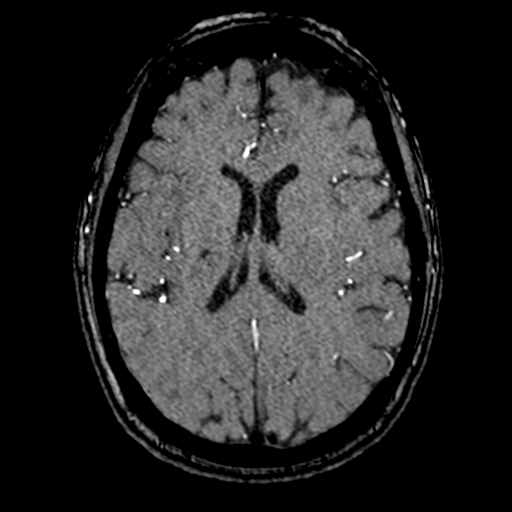
[im 164/172]
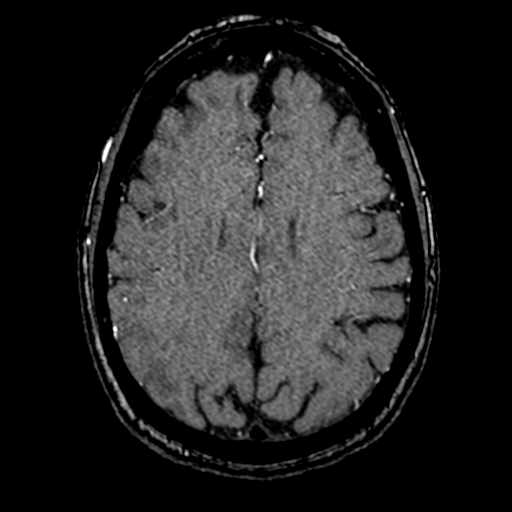

[20 of 48 positions shown; findings below may reference images not displayed]

FINDINGS: Motion artifact is present. Intracranial internal carotid arteries
are patent. Middle and anterior cerebral arteries are patent.
Intracranial vertebral arteries, basilar artery, posterior cerebral
arteries are patent. Bilateral posterior communicating arteries are
present.
IMPRESSION: Motion degraded. No proximal intracranial vessel occlusion or
definite significant stenosis.

## 2022-04-23 DIAGNOSIS — I129 Hypertensive chronic kidney disease with stage 1 through stage 4 chronic kidney disease, or unspecified chronic kidney disease: Secondary | ICD-10-CM | POA: Diagnosis not present

## 2022-04-23 DIAGNOSIS — D631 Anemia in chronic kidney disease: Secondary | ICD-10-CM | POA: Diagnosis not present

## 2022-04-23 DIAGNOSIS — N184 Chronic kidney disease, stage 4 (severe): Secondary | ICD-10-CM | POA: Diagnosis not present

## 2022-04-23 DIAGNOSIS — I639 Cerebral infarction, unspecified: Secondary | ICD-10-CM | POA: Diagnosis not present

## 2022-04-27 ENCOUNTER — Ambulatory Visit: Payer: Medicare Other | Admitting: Pharmacist

## 2022-04-27 NOTE — Progress Notes (Unsigned)
   S:    -check BP with med changes, f/u with pharm and dr Joya Gaskins?  No chief complaint on file.   Jillian [name] is a 86 y.o. female who presents for hypertension evaluation, education, and management. PMH is significant for ***. Patient was referred and last seen by Primary Care Provider, Dr. Joya Gaskins, on 03/08/22. At last visit, BP 196/72 and had been out of furosemide 60 mg. Refills were provided and new telmisartan increased from 40mg  to 80mg , hydralazine and isosorbide were added at that visit. *** look at ended *** After those changes, patient presented to ED on 03/22/2022 for epistaxis 2/2 apixaban use and BP noted to be 218/86 with concerns for questionable adherence to nighttime doses.   Today, patient arrives in *** spirits and presents {w-w/o:315700} assistance. *** Denies dizziness, headache, blurred vision, swelling.   Patient reports hypertension was diagnosed in ***.   Family/Social history: ***  Medication adherence *** . Patient has *** taken BP medications today.   Current antihypertensives include:  Furosemide 60 mg daily (also on potassium 20 meq supplementation)  Carvedilol 25mg  twice daily Telmisartan 80mg  daily Hydralazine 50mg  three times daily  Isosorbide mononitrate 30mg  daily -avoid   Reported home BP readings: ***  Patient reported dietary habits: Eats *** meals/day Breakfast: *** Lunch: *** Dinner: *** Snacks: *** Drinks: ***  Patient-reported exercise habits: ***  ASCVD risk factors include: ***  PHQ-9 Score: ***  O:  Physical Exam  ROS  Last 3 Office BP readings: BP Readings from Last 3 Encounters:  03/23/22 (!) 181/73  03/08/22 (!) 196/72  02/23/22 (!) 168/64    BMET    Component Value Date/Time   NA 141 03/22/2022 2245   NA 145 (H) 03/08/2022 1414   K 3.7 03/22/2022 2245   CL 108 03/22/2022 2245   CO2 27 03/22/2022 2245   GLUCOSE 102 (H) 03/22/2022 2245   BUN 29 (H) 03/22/2022 2245   BUN 39 (H) 03/08/2022 1414   CREATININE 2.42  (H) 03/22/2022 2245   CALCIUM 8.5 (L) 03/22/2022 2245   GFRNONAA 19 (L) 03/22/2022 2245   GFRAA 23 (L) 01/05/2021 1018    Renal function: CrCl cannot be calculated (Patient's most recent lab result is older than the maximum 21 days allowed.).  Clinical ASCVD: {YES/NO:21197} The ASCVD Risk score (Arnett DK, et al., 2019) failed to calculate for the following reasons:   The 2019 ASCVD risk score is only valid for ages 66 to 8   The patient has a prior MI or stroke diagnosis  A/P: Hypertension diagnosed *** currently *** on current medications. BP goal < 130/80 *** mmHg. Medication adherence appears ***. Control is suboptimal due to ***.  -{Meds adjust:18428} ***.  -Patient educated on purpose, proper use, and potential adverse effects of ***.  -F/u labs ordered - *** -Counseled on lifestyle modifications for blood pressure control including reduced dietary sodium, increased exercise, adequate sleep. -Encouraged patient to check BP at home and bring log of readings to next visit. Counseled on proper use of home BP cuff.   Results reviewed and written information provided. Patient verbalized understanding of treatment plan. Total time in face-to-face counseling *** minutes.   F/u clinic visit in ***. Patient seen with ***.

## 2022-04-29 ENCOUNTER — Ambulatory Visit: Payer: Medicare Other | Admitting: Podiatry

## 2022-05-23 IMAGING — CR DG CHEST 2V
2 series · 2 of 2 positions shown · non-contrast
Comparison: July 09, 2020

CLINICAL DATA: Shortness of breath.

EXAM:
CHEST - 2 VIEW

[w chest pa]
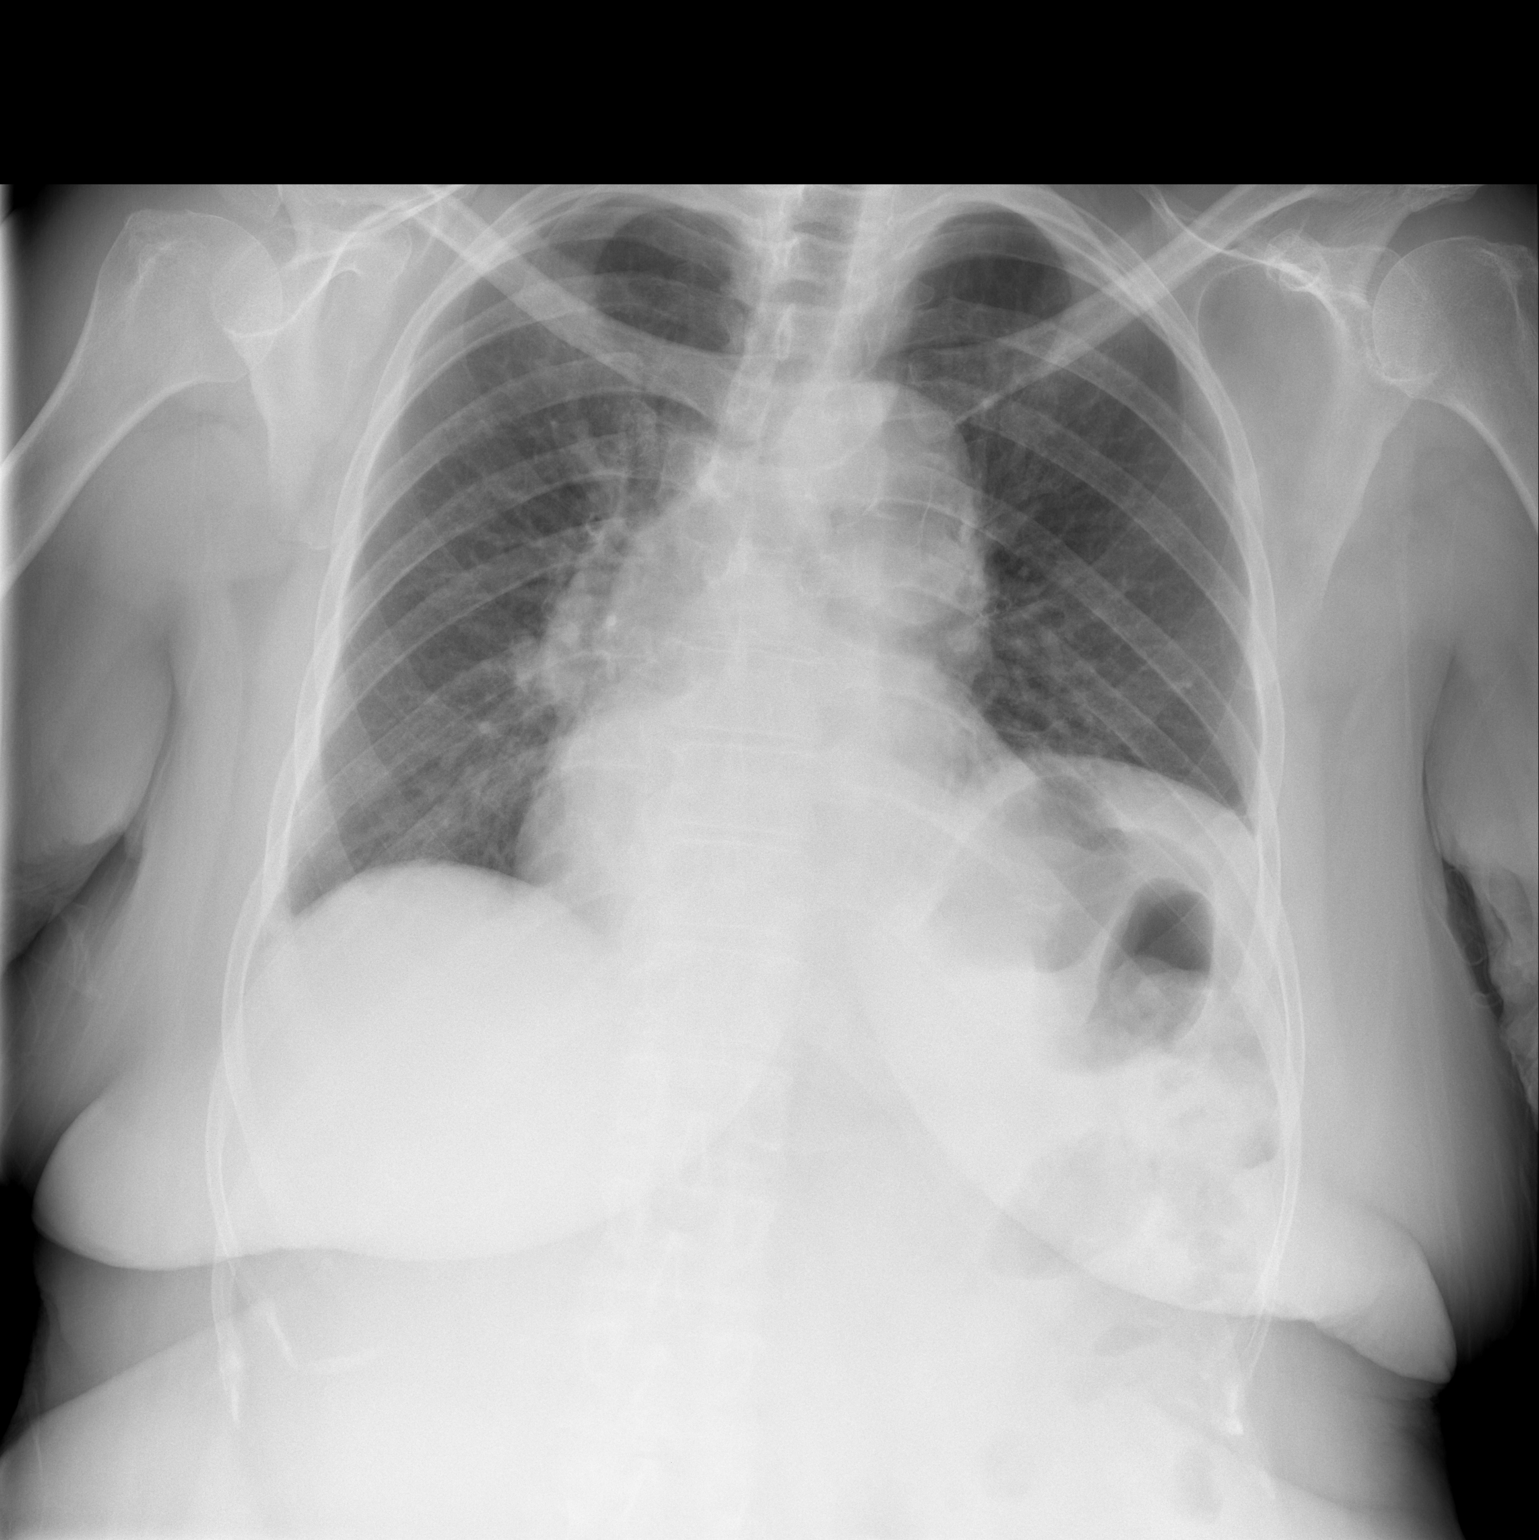

[w chest lat]
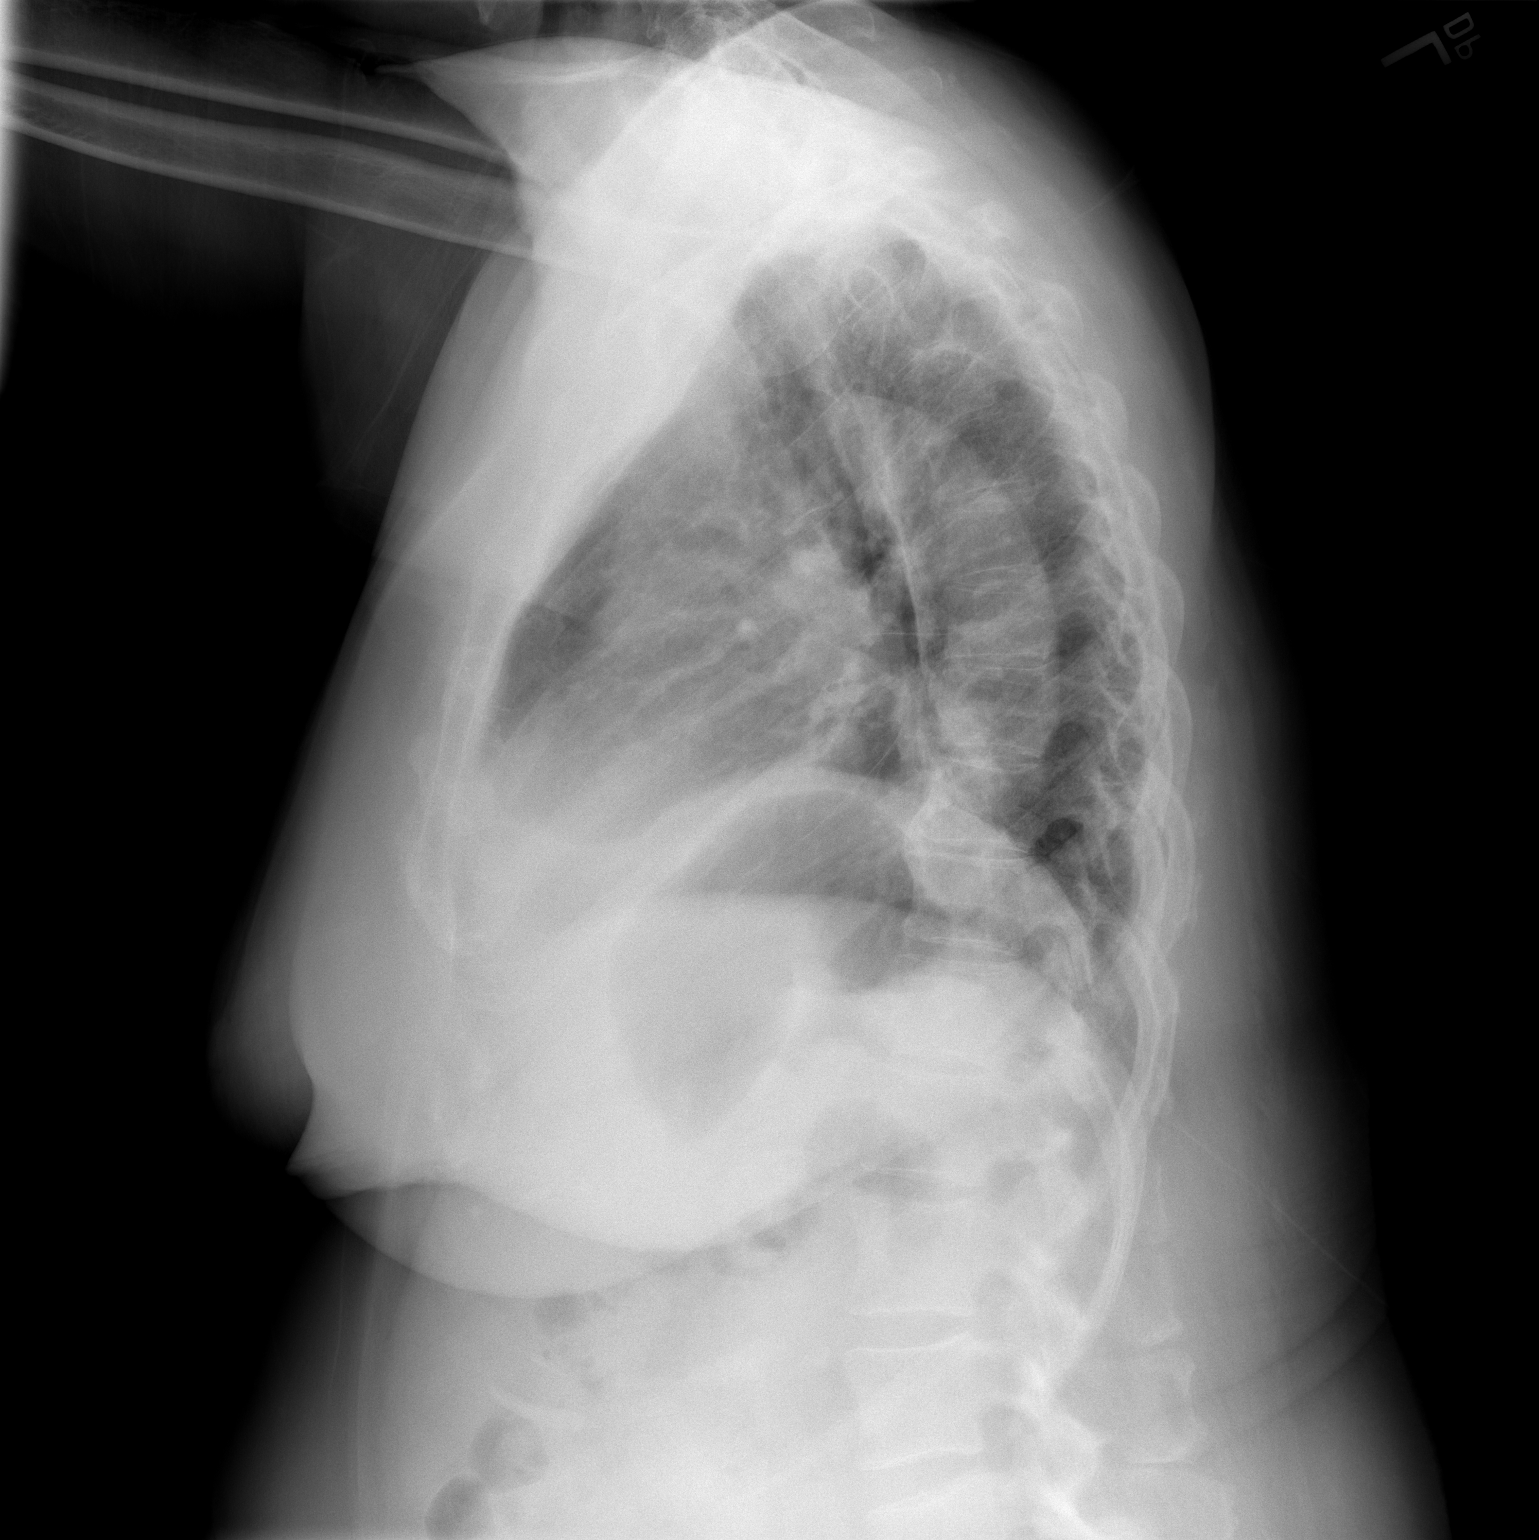

[2 of 2 positions shown; findings below may reference images not displayed]

FINDINGS: The heart size and mediastinal contours are within normal limits.
Both lungs are clear. Mild, stable elevation of the left
hemidiaphragm is seen. The visualized skeletal structures are
unremarkable.
IMPRESSION: No active cardiopulmonary disease.

## 2022-05-26 DIAGNOSIS — N184 Chronic kidney disease, stage 4 (severe): Secondary | ICD-10-CM | POA: Diagnosis not present

## 2022-05-26 DIAGNOSIS — D631 Anemia in chronic kidney disease: Secondary | ICD-10-CM | POA: Diagnosis not present

## 2022-05-26 DIAGNOSIS — E785 Hyperlipidemia, unspecified: Secondary | ICD-10-CM | POA: Diagnosis not present

## 2022-05-26 DIAGNOSIS — I129 Hypertensive chronic kidney disease with stage 1 through stage 4 chronic kidney disease, or unspecified chronic kidney disease: Secondary | ICD-10-CM | POA: Diagnosis not present

## 2022-05-29 IMAGING — US US RENAL
1 series · 14 of 25 positions shown · non-contrast
Comparison: None

CLINICAL DATA: Stage IV chronic kidney disease, hypertension

EXAM:
RENAL / URINARY TRACT ULTRASOUND COMPLETE

[Series 1: us renal · 0.23mm/px · 14 of 34 slices shown]
[im 1/34]
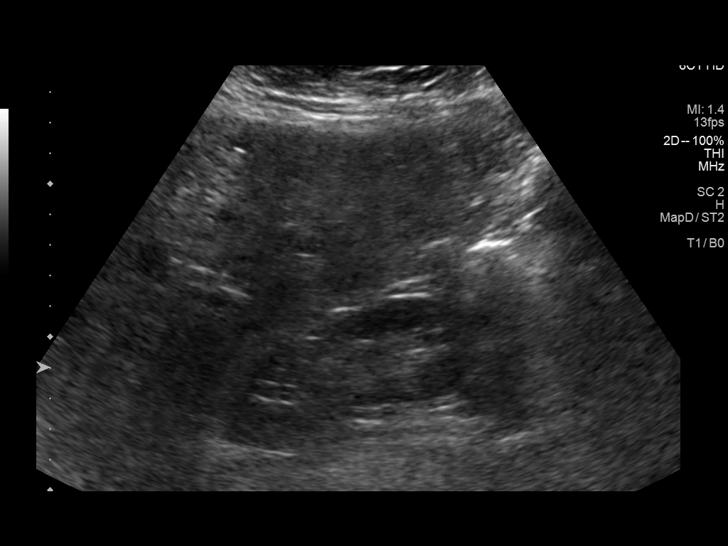
[im 3/34]
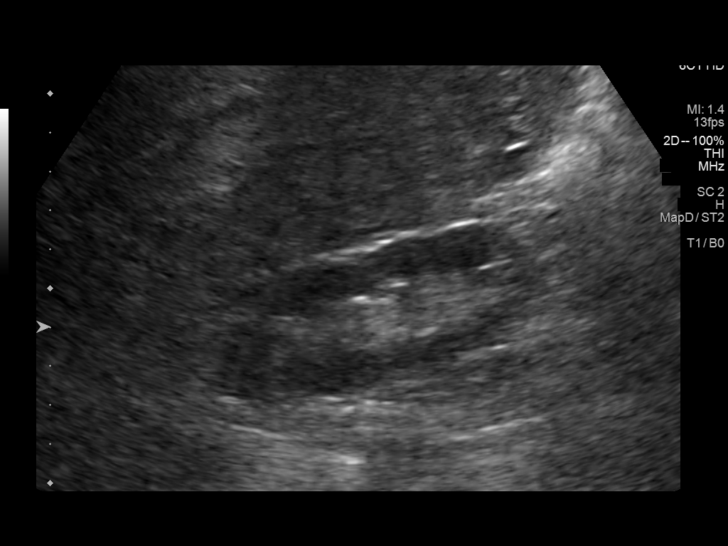
[im 6/34]
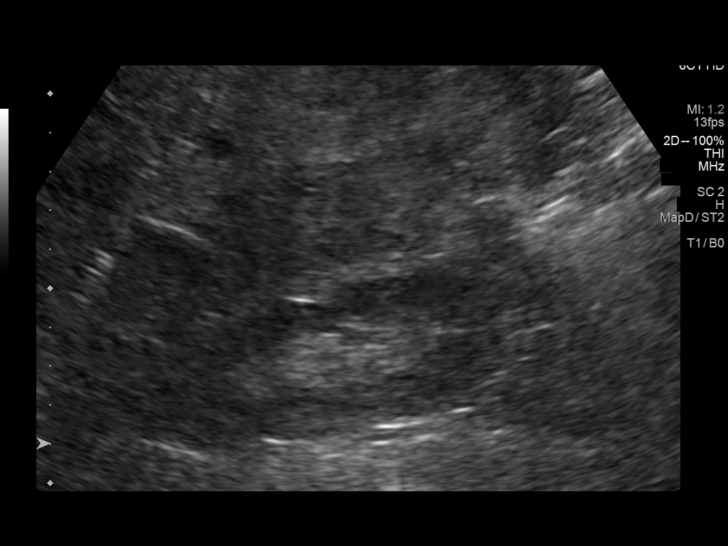
[im 9/34]
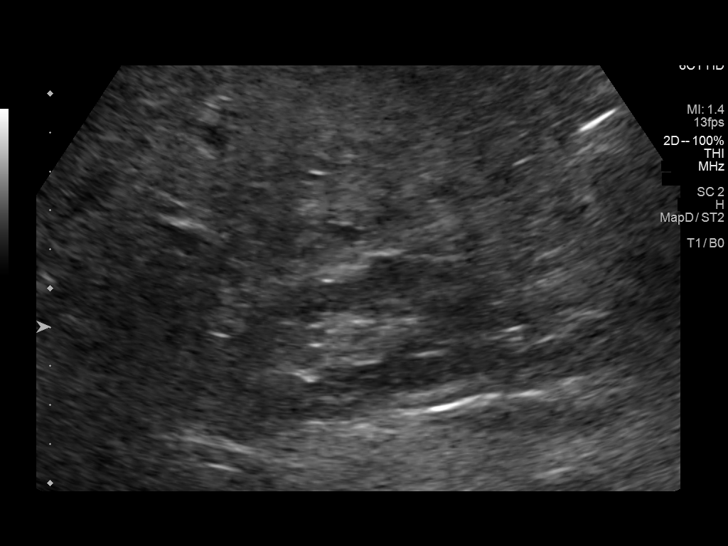
[im 12/34]
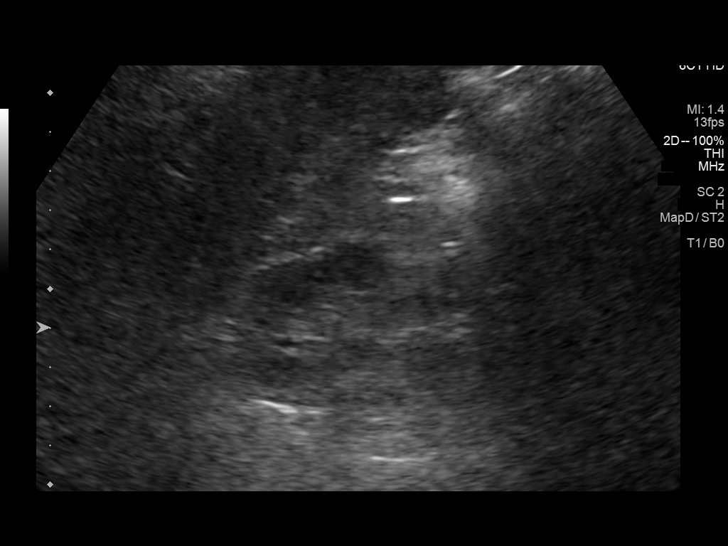
[im 13/34]
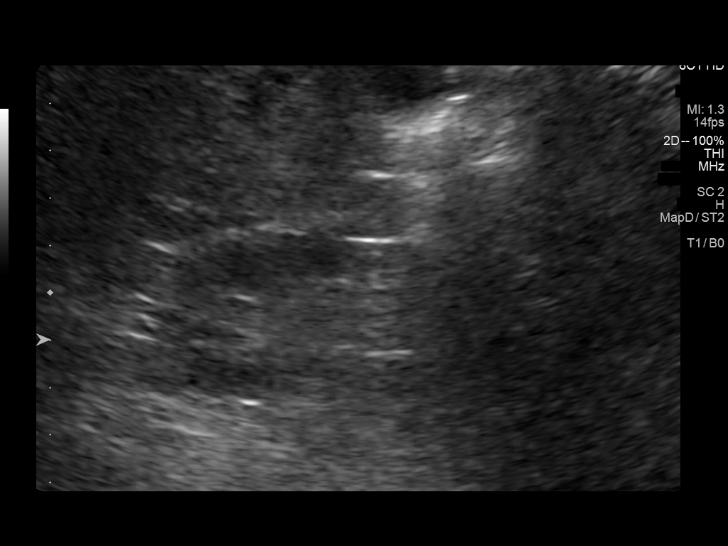
[im 16/34]
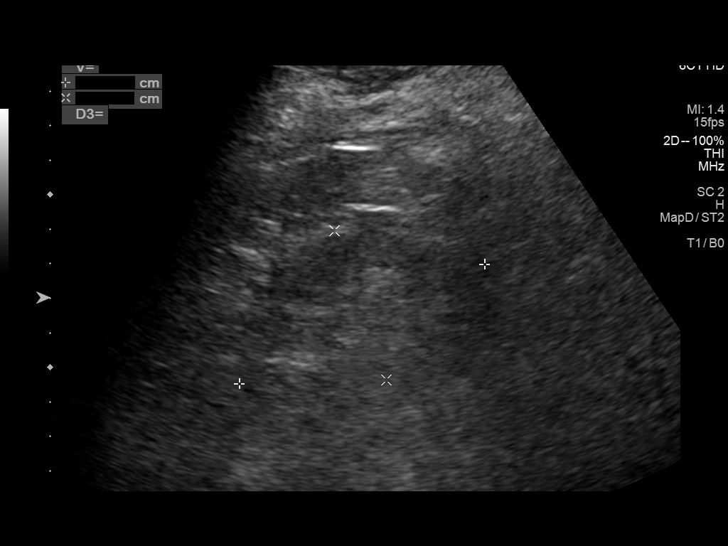
[im 18/34]
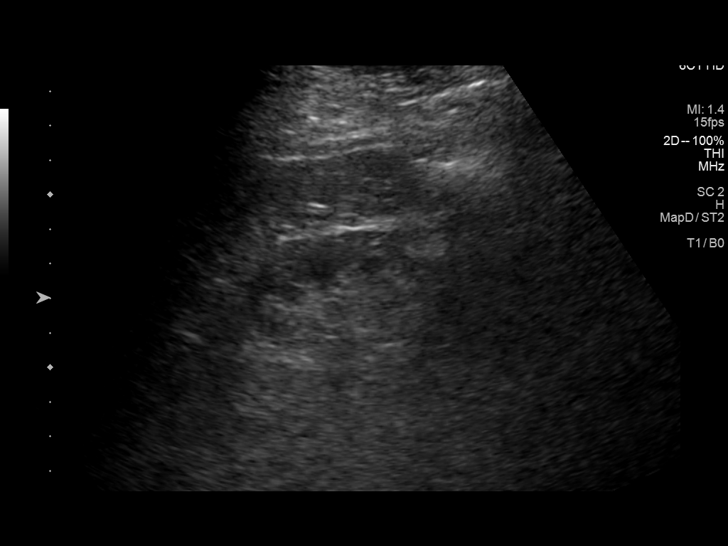
[im 21/34]
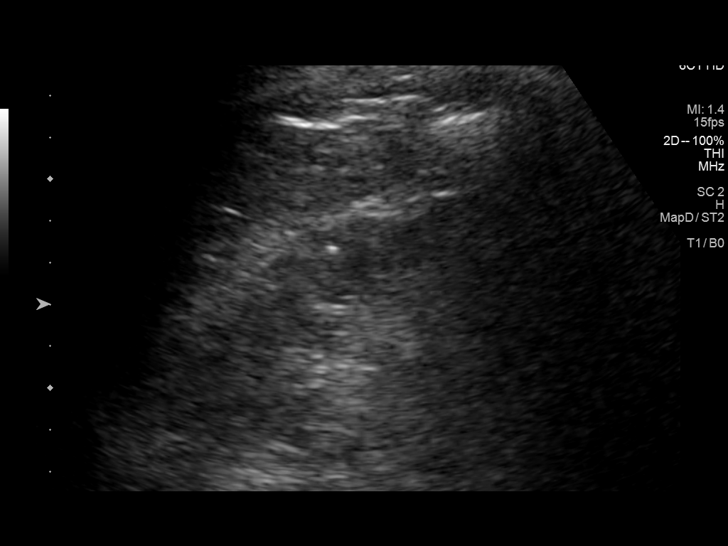
[im 23/34]
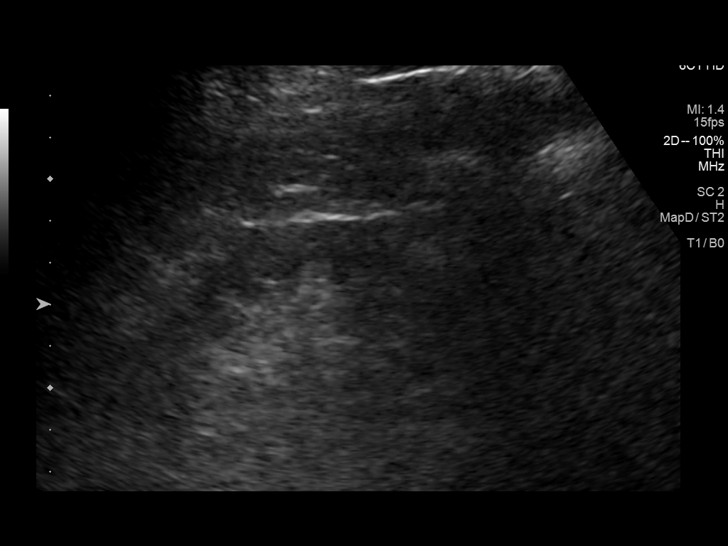
[im 25/34]
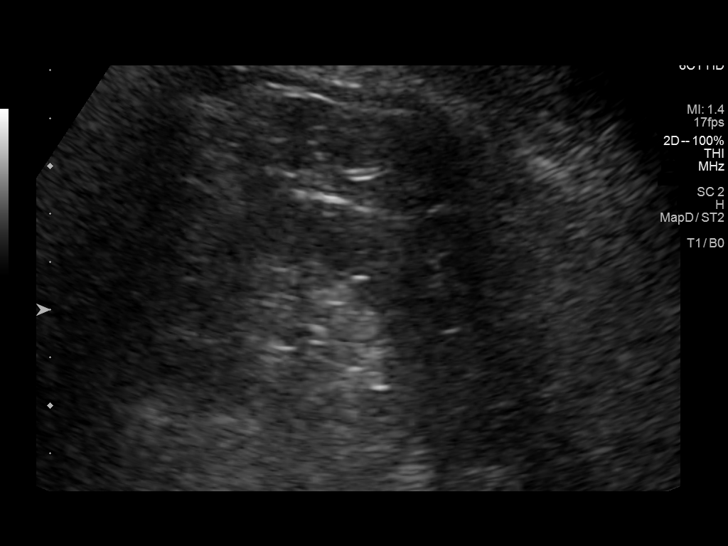
[im 28/34]
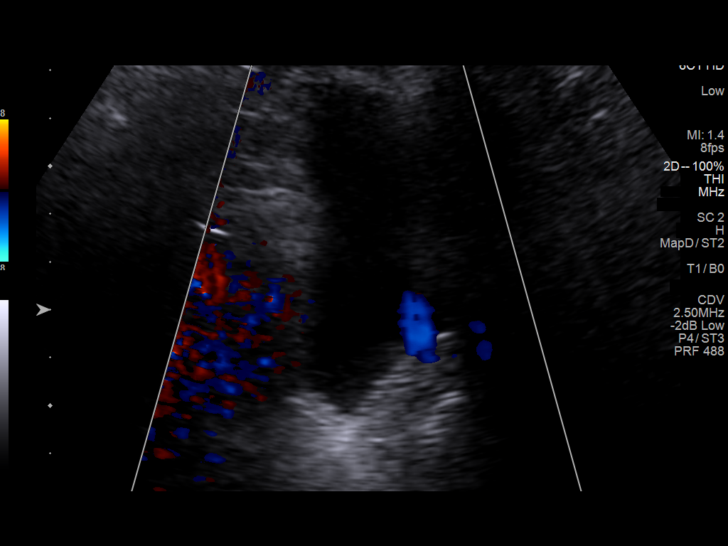
[im 31/34]
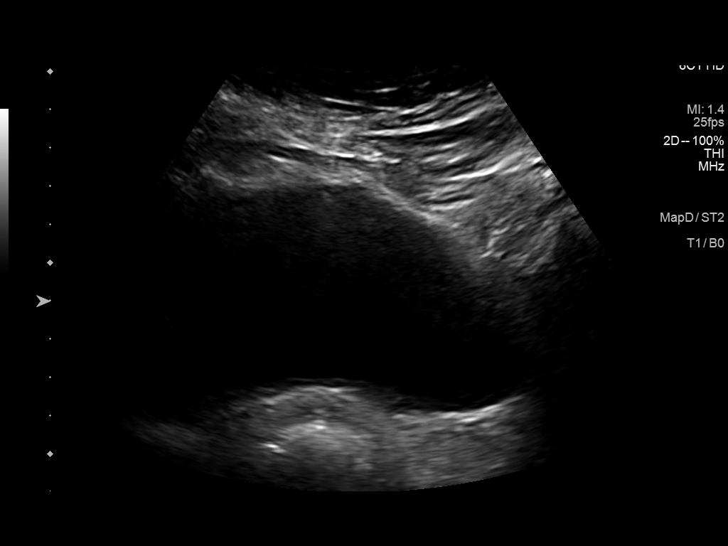
[im 34/34]
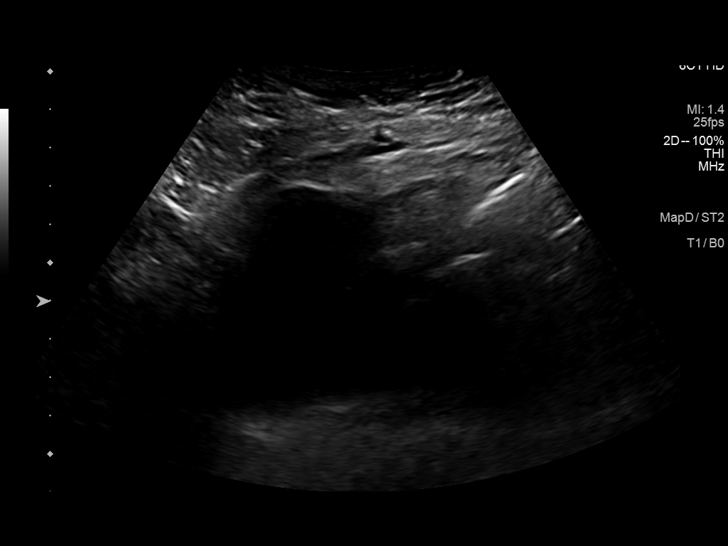

[14 of 25 positions shown; findings below may reference images not displayed]

FINDINGS: Right Kidney:

Renal measurements: 8.8 x 3.1 x 4.5 cm = volume: 64 mL. Cortical
thinning. Normal cortical echogenicity. No mass, hydronephrosis, or
shadowing calcification.

Left Kidney:

Renal measurements: 7.9 x 4.6 x 3.6 cm = volume: 67 mL. Poorly
visualized due to to poor acoustic window and body habitus. Cortical
thinning. No definite mass or hydronephrosis.

Bladder:

Appears normal for partial degree of bladder distention.

Other:

N/A
IMPRESSION: BILATERAL renal cortical thinning without definite mass or
hydronephrosis.

## 2022-06-15 ENCOUNTER — Ambulatory Visit: Payer: Self-pay

## 2022-06-15 NOTE — Patient Outreach (Signed)
  Care Management   Outreach Note  06/15/2022 Name: Jillian [name] MRN: [id number] DOB: Sep 21, 1936  Successful contact was made with the patient to complete a care coordination assessment. It is noted patients brother recently passed away and patient is unable to complete today's call.  Follow Up Plan:  The care management team will reach out to the patient again over the next 14 days.   Daneen Schick, BSW, CDP Social Worker, Certified Dementia Practitioner Care Coordination [phone]

## 2022-07-09 DIAGNOSIS — N184 Chronic kidney disease, stage 4 (severe): Secondary | ICD-10-CM | POA: Diagnosis not present

## 2022-07-09 DIAGNOSIS — I129 Hypertensive chronic kidney disease with stage 1 through stage 4 chronic kidney disease, or unspecified chronic kidney disease: Secondary | ICD-10-CM | POA: Diagnosis not present

## 2022-07-15 DIAGNOSIS — N184 Chronic kidney disease, stage 4 (severe): Secondary | ICD-10-CM | POA: Diagnosis not present

## 2022-07-15 DIAGNOSIS — I129 Hypertensive chronic kidney disease with stage 1 through stage 4 chronic kidney disease, or unspecified chronic kidney disease: Secondary | ICD-10-CM | POA: Diagnosis not present

## 2022-07-27 ENCOUNTER — Ambulatory Visit: Payer: Self-pay

## 2022-07-27 NOTE — Patient Outreach (Signed)
  Care Coordination   07/27/2022 Name: Jillian [name] MRN: [id number] DOB: [birth year]   Care Coordination Outreach Attempts:  A second unsuccessful outreach was attempted today to offer the patient with information about available care coordination services as a benefit of their health plan.     Follow Up Plan:  Additional outreach attempts will be made to offer the patient care coordination information and services.   Encounter Outcome:  No Answer  Care Coordination Interventions Activated:  No   Care Coordination Interventions:  No, not indicated    Daneen Schick, BSW, CDP Social Worker, Certified Dementia Practitioner Care Coordination [phone]

## 2022-07-29 ENCOUNTER — Ambulatory Visit: Payer: Self-pay

## 2022-07-29 NOTE — Patient Outreach (Signed)
  Care Coordination   07/29/2022 Name: Marai Teehan MRN: [id number] DOB: August 25, 1936   Care Coordination Outreach Attempts:  A third unsuccessful outreach was attempted today to offer the patient with information about available care coordination services as a benefit of their health plan.   Follow Up Plan:  No further outreach attempts will be made at this time. We have been unable to contact the patient to offer or enroll patient in care coordination services  Encounter Outcome:  No Answer  Care Coordination Interventions Activated:  No   Care Coordination Interventions:  No, not indicated    Daneen Schick, BSW, CDP Social Worker, Certified Dementia Practitioner [place] Coordination [phone]

## 2022-08-03 ENCOUNTER — Ambulatory Visit: Payer: Medicare Other | Admitting: Podiatrist

## 2022-08-03 ENCOUNTER — Encounter: Payer: Self-pay | Admitting: Podiatrist

## 2022-08-03 DIAGNOSIS — M79676 Pain in unspecified toe(s): Secondary | ICD-10-CM

## 2022-08-03 DIAGNOSIS — B351 Tinea unguium: Secondary | ICD-10-CM | POA: Diagnosis not present

## 2022-08-03 NOTE — Progress Notes (Signed)
Subjective: Jillian [name] is a 86 y.o. female patient who presents to office today with concern of long,mildly painful nails  while ambulating in shoes; unable to trim.  Patient denies any new cramping, numbness, burning or tingling in the legs or feet.  Patient Active Problem List   Diagnosis Date Noted   Acute bronchitis 02/19/2022   Alopecia 01/05/2021   Pain in both feet 01/05/2021   Anemia due to stage 4 chronic kidney disease ([place]) 07/24/2020   Chronic diastolic heart failure ([place]) 07/24/2020   Hypertension with impaired renal function 07/24/2020   Mixed dyslipidemia 07/24/2020   Allergic rhinitis 07/24/2020   History of CVA (cerebrovascular accident) 07/24/2020   Prediabetes 07/24/2020   CKD (chronic kidney disease) stage 4, GFR 15-29 ml/min (HCC) 07/09/2020   Class 2 severe obesity due to excess calories with serious comorbidity and body mass index (BMI) of 36.0 to 36.9 in adult ([place]) 10/28/2016   Allergic conjunctivitis of both eyes 05/27/2015   Benign essential hypertension 11/15/1898   Cerebral artery occlusion 11/15/1898   Current Outpatient Medications on File Prior to Visit  Medication Sig Dispense Refill   apixaban (ELIQUIS) 2.5 MG TABS tablet Take 1 tablet (2.5 mg total) by mouth 2 (two) times daily. 60 tablet 6   carvedilol (COREG) 25 MG tablet Take 1 tablet (25 mg total) by mouth 2 (two) times daily. 120 tablet 6   desoximetasone (TOPICORT) 0.25 % cream Apply 1 application topically 2 (two) times daily. 30 g 0   furosemide (LASIX) 20 MG tablet TAKE 3 TABLETS BY MOUTH ONCE DAILY KEEP  DECEMBER  APPT  WITH  CARDIOLOGIST  FOR  FURTHER  REFILLS (Patient taking differently: Take 60 mg by mouth in the morning.) 270 tablet 3   hydrALAZINE (APRESOLINE) 50 MG tablet Take 1 tablet (50 mg total) by mouth 3 (three) times daily. 90 tablet 2   isosorbide mononitrate (IMDUR) 30 MG 24 hr tablet Take 1 tablet (30 mg total) by mouth daily. 90 tablet 2   rosuvastatin (CRESTOR) 10 MG  tablet Take 1 tablet (10 mg total) by mouth daily. 30 tablet 0   sennosides-docusate sodium (SENOKOT-S) 8.6-50 MG tablet Take 3 tablets by mouth at bedtime. 60 tablet 4   telmisartan (MICARDIS) 80 MG tablet Take 1 tablet (80 mg total) by mouth daily. 90 tablet 2   No current facility-administered medications on file prior to visit.   Allergies  Allergen Reactions   Latex Other (See Comments)    "Blisters my skin and tears it off"- skin burns, too   Penicillins Nausea Only   Tape Other (See Comments)    "Tapes tears off my skin"   Aspirin Other (See Comments)    Irritates the stomach   Atorvastatin Other (See Comments)    Caused fatigue   Diltiazem Hcl Nausea And Vomiting     Objective: General: Patient is awake, alert, and oriented x 3 and in no acute distress.  Integument: Skin is warm, dry and supple bilateral. Nails are tender, long, thickened and  dystrophic with subungual debris, consistent with onychomycosis, 1-5 bilateral. No signs of infection. No open lesions or preulcerative lesions present bilateral. Remaining integument unremarkable.  Vasculature:  Dorsalis Pedis pulse 1/4 bilateral. Posterior Tibial pulse  1/4 bilateral.  Capillary fill time <3 sec 1-5 bilateral. Positive hair growth to the level of the digits. Temperature gradient within normal limits. No varicosities present bilateral. No edema present bilateral.   Neurology: The patient has intact sensation measured with a 5.07/10g  Semmes Weinstein Monofilament at all pedal sites bilateral . Vibratory sensation diminished bilateral with tuning fork. No Babinski sign present bilateral.   Musculoskeletal: No symptomatic pedal deformities noted bilateral. Muscular strength 5/5 in all lower extremity muscular groups bilateral without pain on range of motion . No tenderness with calf compression bilateral.  Assessment and Plan:   ICD-10-CM   1. Pain due to onychomycosis of toenail  B35.1    M79.676         -Examined patient. -Mechanically debrided all nails 1-5 bilateral using sterile nail nipper and filed with sterile dremel without incident  -Answered all patient questions -Patient to return  in 3 months for continued foot care.  -Patient advised to call the office if any problems or questions arise in the meantime.  Bronson Ing, DPM

## 2022-08-06 DIAGNOSIS — N184 Chronic kidney disease, stage 4 (severe): Secondary | ICD-10-CM | POA: Diagnosis not present

## 2022-08-09 DIAGNOSIS — I639 Cerebral infarction, unspecified: Secondary | ICD-10-CM | POA: Diagnosis not present

## 2022-08-09 DIAGNOSIS — N184 Chronic kidney disease, stage 4 (severe): Secondary | ICD-10-CM | POA: Diagnosis not present

## 2022-08-09 DIAGNOSIS — D631 Anemia in chronic kidney disease: Secondary | ICD-10-CM | POA: Diagnosis not present

## 2022-08-09 DIAGNOSIS — E785 Hyperlipidemia, unspecified: Secondary | ICD-10-CM | POA: Diagnosis not present

## 2022-08-09 DIAGNOSIS — I129 Hypertensive chronic kidney disease with stage 1 through stage 4 chronic kidney disease, or unspecified chronic kidney disease: Secondary | ICD-10-CM | POA: Diagnosis not present

## 2022-08-25 DIAGNOSIS — N184 Chronic kidney disease, stage 4 (severe): Secondary | ICD-10-CM | POA: Diagnosis not present

## 2022-08-25 DIAGNOSIS — I129 Hypertensive chronic kidney disease with stage 1 through stage 4 chronic kidney disease, or unspecified chronic kidney disease: Secondary | ICD-10-CM | POA: Diagnosis not present

## 2022-09-13 DIAGNOSIS — N184 Chronic kidney disease, stage 4 (severe): Secondary | ICD-10-CM | POA: Diagnosis not present

## 2022-09-13 DIAGNOSIS — E785 Hyperlipidemia, unspecified: Secondary | ICD-10-CM | POA: Diagnosis not present

## 2022-09-13 DIAGNOSIS — I129 Hypertensive chronic kidney disease with stage 1 through stage 4 chronic kidney disease, or unspecified chronic kidney disease: Secondary | ICD-10-CM | POA: Diagnosis not present

## 2022-09-13 DIAGNOSIS — I5030 Unspecified diastolic (congestive) heart failure: Secondary | ICD-10-CM | POA: Diagnosis not present

## 2022-09-13 DIAGNOSIS — I639 Cerebral infarction, unspecified: Secondary | ICD-10-CM | POA: Diagnosis not present

## 2022-09-13 DIAGNOSIS — D631 Anemia in chronic kidney disease: Secondary | ICD-10-CM | POA: Diagnosis not present

## 2022-09-30 DIAGNOSIS — E876 Hypokalemia: Secondary | ICD-10-CM | POA: Diagnosis not present

## 2022-10-11 ENCOUNTER — Emergency Department (HOSPITAL_COMMUNITY): Payer: Medicare Other

## 2022-10-11 ENCOUNTER — Emergency Department (HOSPITAL_COMMUNITY)
Admission: EM | Admit: 2022-10-11 | Discharge: 2022-10-11 | Disposition: A | Discharge status: Other Acute Inpatient Hospital | Payer: Medicare Other | Attending: Emergency Medicine | Admitting: Emergency Medicine

## 2022-10-11 ENCOUNTER — Encounter (HOSPITAL_COMMUNITY): Payer: Self-pay | Admitting: Emergency Medicine

## 2022-10-11 ENCOUNTER — Other Ambulatory Visit: Payer: Self-pay

## 2022-10-11 DIAGNOSIS — R29706 NIHSS score 6: Secondary | ICD-10-CM | POA: Diagnosis not present

## 2022-10-11 DIAGNOSIS — Z8673 Personal history of transient ischemic attack (TIA), and cerebral infarction without residual deficits: Secondary | ICD-10-CM | POA: Diagnosis not present

## 2022-10-11 DIAGNOSIS — I480 Paroxysmal atrial fibrillation: Secondary | ICD-10-CM | POA: Diagnosis not present

## 2022-10-11 DIAGNOSIS — G459 Transient cerebral ischemic attack, unspecified: Secondary | ICD-10-CM | POA: Diagnosis not present

## 2022-10-11 DIAGNOSIS — I6622 Occlusion and stenosis of left posterior cerebral artery: Secondary | ICD-10-CM | POA: Diagnosis not present

## 2022-10-11 DIAGNOSIS — Z886 Allergy status to analgesic agent status: Secondary | ICD-10-CM | POA: Diagnosis not present

## 2022-10-11 DIAGNOSIS — R9082 White matter disease, unspecified: Secondary | ICD-10-CM | POA: Diagnosis not present

## 2022-10-11 DIAGNOSIS — R200 Anesthesia of skin: Secondary | ICD-10-CM | POA: Diagnosis not present

## 2022-10-11 DIAGNOSIS — I6619 Occlusion and stenosis of unspecified anterior cerebral artery: Secondary | ICD-10-CM | POA: Diagnosis not present

## 2022-10-11 DIAGNOSIS — I5032 Chronic diastolic (congestive) heart failure: Secondary | ICD-10-CM | POA: Diagnosis not present

## 2022-10-11 DIAGNOSIS — I509 Heart failure, unspecified: Secondary | ICD-10-CM | POA: Diagnosis not present

## 2022-10-11 DIAGNOSIS — N3001 Acute cystitis with hematuria: Secondary | ICD-10-CM | POA: Diagnosis not present

## 2022-10-11 DIAGNOSIS — Z743 Need for continuous supervision: Secondary | ICD-10-CM | POA: Diagnosis not present

## 2022-10-11 DIAGNOSIS — R4781 Slurred speech: Secondary | ICD-10-CM | POA: Diagnosis not present

## 2022-10-11 DIAGNOSIS — Z79899 Other long term (current) drug therapy: Secondary | ICD-10-CM | POA: Diagnosis not present

## 2022-10-11 DIAGNOSIS — I151 Hypertension secondary to other renal disorders: Secondary | ICD-10-CM | POA: Diagnosis not present

## 2022-10-11 DIAGNOSIS — I639 Cerebral infarction, unspecified: Secondary | ICD-10-CM | POA: Diagnosis not present

## 2022-10-11 DIAGNOSIS — Z91040 Latex allergy status: Secondary | ICD-10-CM | POA: Insufficient documentation

## 2022-10-11 DIAGNOSIS — I63512 Cerebral infarction due to unspecified occlusion or stenosis of left middle cerebral artery: Secondary | ICD-10-CM | POA: Diagnosis not present

## 2022-10-11 DIAGNOSIS — R1310 Dysphagia, unspecified: Secondary | ICD-10-CM | POA: Diagnosis not present

## 2022-10-11 DIAGNOSIS — Z4682 Encounter for fitting and adjustment of non-vascular catheter: Secondary | ICD-10-CM | POA: Diagnosis not present

## 2022-10-11 DIAGNOSIS — I6603 Occlusion and stenosis of bilateral middle cerebral arteries: Secondary | ICD-10-CM | POA: Diagnosis not present

## 2022-10-11 DIAGNOSIS — I63412 Cerebral infarction due to embolism of left middle cerebral artery: Secondary | ICD-10-CM | POA: Diagnosis not present

## 2022-10-11 DIAGNOSIS — N189 Chronic kidney disease, unspecified: Secondary | ICD-10-CM | POA: Diagnosis not present

## 2022-10-11 DIAGNOSIS — N184 Chronic kidney disease, stage 4 (severe): Secondary | ICD-10-CM | POA: Diagnosis not present

## 2022-10-11 DIAGNOSIS — I130 Hypertensive heart and chronic kidney disease with heart failure and stage 1 through stage 4 chronic kidney disease, or unspecified chronic kidney disease: Secondary | ICD-10-CM | POA: Insufficient documentation

## 2022-10-11 DIAGNOSIS — Z7901 Long term (current) use of anticoagulants: Secondary | ICD-10-CM | POA: Diagnosis not present

## 2022-10-11 DIAGNOSIS — G8191 Hemiplegia, unspecified affecting right dominant side: Secondary | ICD-10-CM | POA: Diagnosis not present

## 2022-10-11 DIAGNOSIS — R4701 Aphasia: Secondary | ICD-10-CM | POA: Diagnosis not present

## 2022-10-11 DIAGNOSIS — I252 Old myocardial infarction: Secondary | ICD-10-CM | POA: Diagnosis not present

## 2022-10-11 DIAGNOSIS — I6523 Occlusion and stenosis of bilateral carotid arteries: Secondary | ICD-10-CM | POA: Diagnosis not present

## 2022-10-11 DIAGNOSIS — E785 Hyperlipidemia, unspecified: Secondary | ICD-10-CM | POA: Diagnosis not present

## 2022-10-11 DIAGNOSIS — R29810 Facial weakness: Secondary | ICD-10-CM | POA: Diagnosis not present

## 2022-10-11 DIAGNOSIS — R531 Weakness: Secondary | ICD-10-CM | POA: Diagnosis not present

## 2022-10-11 DIAGNOSIS — Z880 Allergy status to penicillin: Secondary | ICD-10-CM | POA: Diagnosis not present

## 2022-10-11 DIAGNOSIS — R29818 Other symptoms and signs involving the nervous system: Secondary | ICD-10-CM | POA: Diagnosis not present

## 2022-10-11 DIAGNOSIS — E559 Vitamin D deficiency, unspecified: Secondary | ICD-10-CM | POA: Diagnosis not present

## 2022-10-11 DIAGNOSIS — I2510 Atherosclerotic heart disease of native coronary artery without angina pectoris: Secondary | ICD-10-CM | POA: Diagnosis not present

## 2022-10-11 DIAGNOSIS — R6889 Other general symptoms and signs: Secondary | ICD-10-CM | POA: Diagnosis not present

## 2022-10-11 DIAGNOSIS — I6782 Cerebral ischemia: Secondary | ICD-10-CM | POA: Diagnosis not present

## 2022-10-11 DIAGNOSIS — R0989 Other specified symptoms and signs involving the circulatory and respiratory systems: Secondary | ICD-10-CM | POA: Diagnosis not present

## 2022-10-11 DIAGNOSIS — I499 Cardiac arrhythmia, unspecified: Secondary | ICD-10-CM | POA: Diagnosis not present

## 2022-10-11 DIAGNOSIS — D631 Anemia in chronic kidney disease: Secondary | ICD-10-CM | POA: Diagnosis not present

## 2022-10-11 DIAGNOSIS — Z888 Allergy status to other drugs, medicaments and biological substances status: Secondary | ICD-10-CM | POA: Diagnosis not present

## 2022-10-11 DIAGNOSIS — D649 Anemia, unspecified: Secondary | ICD-10-CM | POA: Diagnosis not present

## 2022-10-11 DIAGNOSIS — G9389 Other specified disorders of brain: Secondary | ICD-10-CM | POA: Diagnosis not present

## 2022-10-11 DIAGNOSIS — I358 Other nonrheumatic aortic valve disorders: Secondary | ICD-10-CM | POA: Diagnosis not present

## 2022-10-11 DIAGNOSIS — I517 Cardiomegaly: Secondary | ICD-10-CM | POA: Diagnosis not present

## 2022-10-11 DIAGNOSIS — I651 Occlusion and stenosis of basilar artery: Secondary | ICD-10-CM | POA: Diagnosis not present

## 2022-10-11 DIAGNOSIS — R414 Neurologic neglect syndrome: Secondary | ICD-10-CM | POA: Diagnosis not present

## 2022-10-11 LAB — I-STAT CHEM 8, ED
BUN: 30 mg/dL — ABNORMAL HIGH (ref 8–23)
Calcium, Ion: 1 mmol/L — ABNORMAL LOW (ref 1.15–1.40)
Chloride: 109 mmol/L (ref 98–111)
Creatinine, Ser: 2.2 mg/dL — ABNORMAL HIGH (ref 0.44–1.00)
Glucose, Bld: 114 mg/dL — ABNORMAL HIGH (ref 70–99)
HCT: 29 % — ABNORMAL LOW (ref 36.0–46.0)
Hemoglobin: 9.9 g/dL — ABNORMAL LOW (ref 12.0–15.0)
Potassium: 3.6 mmol/L (ref 3.5–5.1)
Sodium: 143 mmol/L (ref 135–145)
TCO2: 25 mmol/L (ref 22–32)

## 2022-10-11 LAB — RAPID URINE DRUG SCREEN, HOSP PERFORMED
Amphetamines: NOT DETECTED
Barbiturates: NOT DETECTED
Benzodiazepines: NOT DETECTED
Cocaine: NOT DETECTED
Opiates: NOT DETECTED
Tetrahydrocannabinol: NOT DETECTED

## 2022-10-11 LAB — URINALYSIS, ROUTINE W REFLEX MICROSCOPIC
Bilirubin Urine: NEGATIVE
Glucose, UA: 100 mg/dL — AB
Ketones, ur: NEGATIVE mg/dL
Leukocytes,Ua: NEGATIVE
Nitrite: NEGATIVE
Protein, ur: >300 mg/dL — AB
Specific Gravity, Urine: 1.02 (ref 1.005–1.030)
pH: 8 (ref 5.0–8.0)

## 2022-10-11 LAB — CBC
HCT: 32.6 % — ABNORMAL LOW (ref 36.0–46.0)
Hemoglobin: 10.3 g/dL — ABNORMAL LOW (ref 12.0–15.0)
MCH: 27.8 pg (ref 26.0–34.0)
MCHC: 31.6 g/dL (ref 30.0–36.0)
MCV: 88.1 fL (ref 80.0–100.0)
Platelets: 278 10*3/uL (ref 150–400)
RBC: 3.7 MIL/uL — ABNORMAL LOW (ref 3.87–5.11)
RDW: 14.4 % (ref 11.5–15.5)
WBC: 7.7 10*3/uL (ref 4.0–10.5)
nRBC: 0 % (ref 0.0–0.2)

## 2022-10-11 LAB — URINALYSIS, MICROSCOPIC (REFLEX): Bacteria, UA: NONE SEEN

## 2022-10-11 LAB — COMPREHENSIVE METABOLIC PANEL
ALT: 14 U/L (ref 0–44)
AST: 23 U/L (ref 15–41)
Albumin: 2.4 g/dL — ABNORMAL LOW (ref 3.5–5.0)
Alkaline Phosphatase: 188 U/L — ABNORMAL HIGH (ref 38–126)
Anion gap: 9 (ref 5–15)
BUN: 29 mg/dL — ABNORMAL HIGH (ref 8–23)
CO2: 25 mmol/L (ref 22–32)
Calcium: 8.5 mg/dL — ABNORMAL LOW (ref 8.9–10.3)
Chloride: 105 mmol/L (ref 98–111)
Creatinine, Ser: 2.28 mg/dL — ABNORMAL HIGH (ref 0.44–1.00)
GFR, Estimated: 21 mL/min — ABNORMAL LOW (ref >60)
Glucose, Bld: 129 mg/dL — ABNORMAL HIGH (ref 70–99)
Potassium: 4 mmol/L (ref 3.5–5.1)
Sodium: 139 mmol/L (ref 135–145)
Total Bilirubin: 0.7 mg/dL (ref 0.3–1.2)
Total Protein: 7.1 g/dL (ref 6.5–8.1)

## 2022-10-11 LAB — DIFFERENTIAL
Abs Immature Granulocytes: 0.06 10*3/uL (ref 0.00–0.07)
Basophils Absolute: 0 10*3/uL (ref 0.0–0.1)
Basophils Relative: 0 %
Eosinophils Absolute: 0.2 10*3/uL (ref 0.0–0.5)
Eosinophils Relative: 3 %
Immature Granulocytes: 1 %
Lymphocytes Relative: 29 %
Lymphs Abs: 2.3 10*3/uL (ref 0.7–4.0)
Monocytes Absolute: 0.5 10*3/uL (ref 0.1–1.0)
Monocytes Relative: 7 %
Neutro Abs: 4.7 10*3/uL (ref 1.7–7.7)
Neutrophils Relative %: 60 %

## 2022-10-11 LAB — APTT: aPTT: 30 seconds (ref 24–36)

## 2022-10-11 LAB — PROTIME-INR
INR: 1.1 (ref 0.8–1.2)
Prothrombin Time: 14.6 seconds (ref 11.4–15.2)

## 2022-10-11 LAB — ETHANOL: Alcohol, Ethyl (B): <10 mg/dL (ref <10)

## 2022-10-11 MED ORDER — IOHEXOL 350 MG/ML SOLN
100.0000 mL | Freq: Once | INTRAVENOUS | Status: AC | PRN
  Administered 2022-10-11: 100 mL via INTRAVENOUS

## 2022-10-11 NOTE — Code Documentation (Signed)
[facility] Left with PT

## 2022-10-11 NOTE — ED Notes (Signed)
[facility] transporting over to [facility]

## 2022-10-11 NOTE — Consult Note (Signed)
Neurology Consultation Reason for Consult: Code stroke Requesting Physician: Godfrey Pick  CC: Speech issues  History is obtained from: EMS and chart review, limited history from patient due to her acute aphasia and agnosia  HPI: Jillian [name] is a 86 y.o. female with a past medical history significant for atrial fibrillation on Eliquis, hypertension, hyperal anemia, prior stroke (07/09/2020, right MCA scattered embolic infarcts secondary to unknown source, eventually found to have atrial fibrillation, no significant residual).  She had awoken at 4 AM in her normal state of health per patient but at 5:30 AM when she was dropping her son off at school he noticed she was not speaking properly and had some right sided weakness for which EMS was activated.  LKW: 8:30 AM on 11/26 Thrombolytic given?: No, Eliquis last dose 11/26 evening IA performed?:  Yes, transfer to another facility due to ongoing procedure here Premorbid modified rankin scale: 0-1     0 - No symptoms.     1 - No significant disability. Able to carry out all usual activities, despite some symptoms.   ROS: Unable to obtain due to her aphasia  Past Medical History:  Diagnosis Date   Acute CVA (cerebrovascular accident) ([place]) 07/12/2020   Hypertension    Renal disorder    CKD Stage IV   Stroke [facility])    Past Surgical History:  Procedure Laterality Date   CARDIAC CATHETERIZATION  2009   Current Outpatient Medications  Medication Instructions   apixaban (ELIQUIS) 2.5 mg, Oral, 2 times daily   carvedilol (COREG) 25 mg, Oral, 2 times daily   desoximetasone (TOPICORT) 5.17 % cream 1 application , Topical, 2 times daily   furosemide (LASIX) 20 MG tablet TAKE 3 TABLETS BY MOUTH ONCE DAILY KEEP  DECEMBER  APPT  WITH  CARDIOLOGIST  FOR  FURTHER  REFILLS   hydrALAZINE (APRESOLINE) 50 mg, Oral, 3 times daily   isosorbide mononitrate (IMDUR) 30 mg, Oral, Daily   rosuvastatin (CRESTOR) 10 mg, Oral, Daily   sennosides-docusate  sodium (SENOKOT-S) 8.6-50 MG tablet 3 tablets, Oral, Daily at bedtime   telmisartan (MICARDIS) 80 mg, Oral, Daily     Family History  Problem Relation Age of Onset   CAD Mother    Breast cancer Mother     Social History:  reports that she has never smoked. She has never used smokeless tobacco. No history on file for alcohol use and drug use.   Exam: Current vital signs: BP (!) 231/149 Comment: MD Kinzly Pierrelouis made aware prior to transfer and approved  Pulse 76   Temp 97.6 F (36.4 C)   Resp (!) 21   SpO2 96%  Vital signs in last 24 hours: Temp:  [98 F (36.7 C)] 98 F (36.7 C) (11/27 1047) Pulse Rate:  [79] 79 (11/27 1047) Resp:  [20] 20 (11/27 1047) BP: (245)/(102) 245/102 (11/27 1047) SpO2:  [95 %] 95 % (11/27 1047)  Limited examination due to multiple simultaneous code strokes, focus on NIH stroke scale with general examination being notable for patient not in any significant distress  NIHSS total 7 Score breakdown: 1-point for not being able to answer month correctly, 1.4 right upper extremity weakness, 1-point for right lower extremity weakness, 1-point for sensory loss on the right side, 1-point for mild to moderate aphasia, 1-point for dysarthria, 1.4 neglect of the right side to double simultaneous stimuli Performed at time of patient arrival to ED    I have reviewed labs in epic and the results pertinent to this  consultation are:  Basic Metabolic Panel: Recent Labs  Lab 10/11/22 1045 10/11/22 1115  NA 139 143  K 4.0 3.6  CL 105 109  CO2 25  --   GLUCOSE 129* 114*  BUN 29* 30*  CREATININE 2.28* 2.20*  CALCIUM 8.5*  --     CBC: Recent Labs  Lab 10/11/22 1045 10/11/22 1115  WBC 7.7  --   NEUTROABS 4.7  --   HGB 10.3* 9.9*  HCT 32.6* 29.0*  MCV 88.1  --   PLT 278  --     Coagulation Studies: Recent Labs    10/11/22 1045  LABPROT 14.6  INR 1.1      I have reviewed the images obtained:  CT head personally reviewed, agree with radiology 1.  No evidence of acute intracranial abnormality. ASPECTS of 10. 2. Chronic ischemia with multiple chronic infarcts as above. 3. Mild-to-moderate chronic small vessel ischemic disease.  CTA and CT perfusion personally reviewed, agree with radiology 1. Mid left M2 branch occlusion. 2. No acute core infarct identified on CTP. 4 mL of penumbra in the left temporoparietal region.  [26 mL by Tmax greater than 4 seconds, which is more congruent with patient's symptoms] 3. Widely patent cervical carotid arteries. 4. Severe left P2 stenosis. 5. Mild stenosis of the right vertebral artery origin. 6. 2.5 cm incidental thyroid nodule. Recommend non-emergent thyroid ultrasound if clinically warranted considering patient's age and comorbidities. Reference: J Am Coll Radiol. 2015 Feb;12(2): 143-50 7. Pulmonary artery enlargement suggesting pulmonary arterial hypertension. 8.  Aortic Atherosclerosis (ICD10-I70.0).   Impression: 86 year old woman with past medical history significant for atrial fibrillation on anticoagulation, hypertension, hyperlipidemia, presenting with acute embolic left MCA stroke.  Given her baseline functional status and her significant predicted disability based on the location of her occlusion and 26 mL of tissue at risk by Tmax greater than 4 seconds on CT perfusion imaging, I do feel that the risks of the thrombectomy would be outweighed by the benefits and this was discussed with her daughter, who verbally consented  Recommendations: -Discussed with Dr. Sherryle Lis at [facility] who graciously accepted the patient for transfer for thrombectomy -Emergent transfer completed with assistance of stroke team   Lesleigh Noe MD-PhD [facility] [phone] Available 7 AM to 7 PM, outside these hours please contact Neurologist on call listed on AMION   Total critical care time: 60 minutes   Critical care time was exclusive of separately billable procedures and treating other  patients.   Critical care was necessary to treat or prevent imminent or life-threatening deterioration.   Critical care was time spent personally by me on the following activities: development of treatment plan with patient and/or surrogate as well as nursing, discussions with consultants/primary team, evaluation of patient's response to treatment, examination of patient, obtaining history from patient or surrogate, ordering and performing treatments and interventions, ordering and review of laboratory studies, ordering and review of radiographic studies, and re-evaluation of patient's condition as needed, as documented above.

## 2022-10-11 NOTE — Code Documentation (Signed)
Stroke Response Nurse Documentation Code Documentation  Jillian [name] is a 86 y.o. female arriving to [facility]  via [facility] on 10/11/2022 with past medical hx of stroke. On Eliquis (apixaban) daily. Code stroke was activated by ED.   Patient from home where she was LKW at 2030 and now complaining of right sided weakness and trouble speaking. Her daughter reported that she dropped her son off this morning at 0530 for her mom to get him ready for school and take him when she noted that her mom's speech was off and she was not acting herself. EMS was called. The patient arrived at 72 and EDP evaluated and noted aphasia with right sided weakness and activated a Code Stroke.   Stroke team at the bedside after patient activation. Labs drawn and patient cleared for CT by Dr. Doren Custard. Patient to CT with team. NIHSS 7, see documentation for details and code stroke times. Patient with disoriented, right arm weakness, right leg weakness, right decreased sensation, Expressive aphasia , dysarthria , and Sensory  neglect on exam. The following imaging was completed:  CT Head, CTA, and CTP. Patient is not a candidate for IV Thrombolytic due to outside window. Patient is a candidate for IR due to M2 occlusion per MD Bhagat.   Care Plan: Patient on the Table at [facility] and time going to exceed > 30 minutes. Decision to call [facility] and transfer.   Patient reports that she feels fine and is unaware of her symptoms. Daughter not onsite and called for consent at 71. Daughter consented with Dr. Curly Sh[place].  [facility] called at 1059 - Unable to Take.  [facility] called at 1101 for Transfer - Completed at 1109 (MD [facility]). [facility] called for Transport at 1105 - Completed at [facility] arrived for Transport at [facility] with [place].   Called Report to [facility] at 1210. Report given to [facility], ED Charge.   Kathrin Greathouse  Stroke Response RN

## 2022-10-11 NOTE — ED Provider Notes (Signed)
[facility] EMERGENCY DEPARTMENT Provider Note   CSN: [id number] Arrival date & time: 10/11/22  1007     History  Chief Complaint  Patient presents with   Code Stroke    Jillian [name] is a 86 y.o. female.  HPI Patient presents for strokelike symptoms.  Medical history includes prior CVA, HTN, anemia, CKD, CHF, HLD, prediabetes.  She has no chronic deficits from prior stroke.  This morning, she awoke at 4 AM in her normal state of health.  At around 530, she was dropping her son off at school.  Patient's son noticed that she did not seem right.  Patient has since noticed difficulty with speech and right-sided weakness.  Vital signs prior to arrival notable for hypertension in the range of 220/100.  CBG was normal.  Patient currently denying any areas of discomfort.    Home Medications Prior to Admission medications   Medication Sig Start Date End Date Taking? Authorizing Provider  apixaban (ELIQUIS) 2.5 MG TABS tablet Take 1 tablet (2.5 mg total) by mouth 2 (two) times daily. 01/11/22   Swinyer, Lanice Schwab, NP  carvedilol (COREG) 25 MG tablet Take 1 tablet (25 mg total) by mouth 2 (two) times daily. 03/09/22 06/07/22  [name], MD  desoximetasone (TOPICORT) 0.25 % cream Apply 1 application topically 2 (two) times daily. 01/05/21   [name], MD  furosemide (LASIX) 20 MG tablet TAKE 3 TABLETS BY MOUTH ONCE DAILY KEEP  DECEMBER  APPT  WITH  CARDIOLOGIST  FOR  FURTHER  REFILLS Patient taking differently: Take 60 mg by mouth in the morning. 01/08/22   Fay Records, MD  hydrALAZINE (APRESOLINE) 50 MG tablet Take 1 tablet (50 mg total) by mouth 3 (three) times daily. 03/09/22 04/08/22  [name], MD  isosorbide mononitrate (IMDUR) 30 MG 24 hr tablet Take 1 tablet (30 mg total) by mouth daily. 03/09/22 04/08/22  [name], MD  rosuvastatin (CRESTOR) 10 MG tablet Take 1 tablet (10 mg total) by mouth daily. 02/24/22   Thurnell Lose, MD   sennosides-docusate sodium (SENOKOT-S) 8.6-50 MG tablet Take 3 tablets by mouth at bedtime. 10/01/21   [name], MD  telmisartan (MICARDIS) 80 MG tablet Take 1 tablet (80 mg total) by mouth daily. 03/09/22   [name], MD      Allergies    Latex, Penicillins, Tape, Aspirin, Atorvastatin, and Diltiazem hcl    Review of Systems   Review of Systems  Neurological:  Positive for facial asymmetry, speech difficulty and weakness.  All other systems reviewed and are negative.   Physical Exam Updated Vital Signs BP (!) 258/106   Pulse 80   Temp 98 F (36.7 C) (Oral)   Resp (!) 25   SpO2 98%  Physical Exam Vitals and nursing note reviewed.  Constitutional:      General: She is not in acute distress.    Appearance: She is well-developed. She is not ill-appearing, toxic-appearing or diaphoretic.  HENT:     Head: Normocephalic and atraumatic.     Right Ear: External ear normal.     Left Ear: External ear normal.     Nose: Nose normal.     Mouth/Throat:     Mouth: Mucous membranes are moist.     Pharynx: Oropharynx is clear.  Eyes:     Extraocular Movements: Extraocular movements intact.     Conjunctiva/sclera: Conjunctivae normal.  Cardiovascular:     Rate and Rhythm: Normal rate  and regular rhythm.  Pulmonary:     Effort: Pulmonary effort is normal. No respiratory distress.  Abdominal:     General: There is no distension.     Palpations: Abdomen is soft.     Tenderness: There is no abdominal tenderness.  Musculoskeletal:        General: No swelling. Normal range of motion.     Cervical back: Normal range of motion and neck supple.     Right lower leg: No edema.     Left lower leg: No edema.  Skin:    General: Skin is warm and dry.     Coloration: Skin is not jaundiced or pale.  Neurological:     Mental Status: She is alert and oriented to person, place, and time.     Cranial Nerves: Dysarthria and facial asymmetry present.     Sensory: No sensory deficit.      Motor: Weakness and pronator drift present.     Coordination: Coordination is intact.     Comments: NIHSS 5  Psychiatric:        Mood and Affect: Mood normal.        Behavior: Behavior normal.        Thought Content: Thought content normal.        Judgment: Judgment normal.     ED Results / Procedures / Treatments   Labs (all labs ordered are listed, but only abnormal results are displayed) Labs Reviewed  I-STAT CHEM 8, ED - Abnormal; Notable for the following components:      Result Value   BUN 30 (*)    Creatinine, Ser 2.20 (*)    Glucose, Bld 114 (*)    Calcium, Ion 1.00 (*)    Hemoglobin 9.9 (*)    HCT 29.0 (*)    All other components within normal limits  ETHANOL  PROTIME-INR  APTT  CBC  DIFFERENTIAL  COMPREHENSIVE METABOLIC PANEL  RAPID URINE DRUG SCREEN, HOSP PERFORMED  URINALYSIS, ROUTINE W REFLEX MICROSCOPIC    EKG EKG Interpretation  Date/Time:  Monday October 11 2022 11:00:41 EST Ventricular Rate:  76 PR Interval:  151 QRS Duration: 100 QT Interval:  435 QTC Calculation: 490 R Axis:   55 Text Interpretation: Sinus rhythm Probable LVH with secondary repol abnrm Repol abnrm, global ischemia, diffuse leads No significant change since last tracing Confirmed by Godfrey Pick [phone]) on 10/11/2022 11:07:15 AM  Radiology CT ANGIO HEAD NECK W WO CM W PERF (CODE STROKE)  Result Date: 10/11/2022 CLINICAL DATA:  Code stroke. Neuro deficit, acute, stroke suspected. Difficulty speaking, right-sided weakness, and facial droop. EXAM: CT ANGIOGRAPHY HEAD AND NECK CT PERFUSION BRAIN TECHNIQUE: Multidetector CT imaging of the head and neck was performed using the standard protocol during bolus administration of intravenous contrast. Multiplanar CT image reconstructions and MIPs were obtained to evaluate the vascular anatomy. Carotid stenosis measurements (when applicable) are obtained utilizing NASCET criteria, using the distal internal carotid diameter as the  denominator. Multiphase CT imaging of the brain was performed following IV bolus contrast injection. Subsequent parametric perfusion maps were calculated using RAPID software. RADIATION DOSE REDUCTION: This exam was performed according to the departmental dose-optimization program which includes automated exposure control, adjustment of the mA and/or kV according to patient size and/or use of iterative reconstruction technique. CONTRAST:  124mL OMNIPAQUE IOHEXOL 350 MG/ML SOLN COMPARISON:  Head MRI 07/11/2020 and head MRA 07/13/2020 FINDINGS: CTA NECK FINDINGS Aortic arch: Standard 3 vessel aortic arch with mild atherosclerotic plaque. Widely patent arch  vessel origins. Right carotid system: Patent with minimal calcified plaque at the carotid bifurcation. No evidence of a significant stenosis or dissection. Left carotid system: Patent with minimal atherosclerotic plaque in the mid to distal common carotid artery and at the carotid bifurcation. No evidence of a significant stenosis or dissection. Vertebral arteries: Patent with a small amount of atherosclerotic plaque at the right vertebral origin resulting in mild stenosis. Codominant. Skeleton: Focally advanced cervical disc degeneration at C5-6 where a broad-based posterior disc osteophyte complex results in potentially severe spinal stenosis and moderate to severe neural foraminal stenosis. Other neck: Diffusely heterogeneous thyroid with multiple nodules bilaterally including a 2.5 cm nodule on the left. No evidence of cervical lymphadenopathy. Upper chest: Pulmonary arterial enlargement with the imaged portion of the main pulmonary artery measuring 4 cm in diameter. Review of the MIP images confirms the above findings CTA HEAD FINDINGS Anterior circulation: The internal carotid arteries are patent from skull base to carotid termini with mild atherosclerotic plaque bilaterally not resulting in significant stenosis. The left M1 segment and left MCA bifurcation  are widely patent, however there is a new mid left M2 branch occlusion. The right MCA and both ACAs are patent without evidence of a significant A1 or M1 stenosis. There are mild proximal right M2 and distal right A2 stenoses. No aneurysm is identified. Posterior circulation: The intracranial vertebral arteries are widely patent to the basilar. Patent right PICA, left AICA, and bilateral SCA origins are visualized. There are large right and small left posterior communicating arteries with hypoplasia of the right P1 segment. Both PCAs are patent, and there is mild irregular narrowing of the right P1 segment as well as a severe stenosis of the distal left P2 segment which was also likely present in 2021. No aneurysm is identified. Venous sinuses: Nonopacification of the right sigmoid sinus which may be secondary to contrast timing and slow flow versus chronic occlusion (also abnormal in 2021). Anatomic variants: Fetal right PCA. Review of the MIP images confirms the above findings CT Brain Perfusion Findings: ASPECTS: 10 CBF (<30%) Volume: 0 mL Perfusion (Tmax>6.0s) volume: 4 mL, predominantly involving white matter near the left temporoparietal junction and posterior insula Mismatch Volume: 4 mL Infarction Location: No acute core infarct identified. These results were communicated to Dr. Curly Shores at 10:49 a.m. on 10/11/2022 by text page via the [facility] messaging system. IMPRESSION: 1. Mid left M2 branch occlusion. 2. No acute core infarct identified on CTP. 4 mL of penumbra in the left temporoparietal region. 3. Widely patent cervical carotid arteries. 4. Severe left P2 stenosis. 5. Mild stenosis of the right vertebral artery origin. 6. 2.5 cm incidental thyroid nodule. Recommend non-emergent thyroid ultrasound if clinically warranted considering patient's age and comorbidities. Reference: J Am Coll Radiol. 2015 Feb;12(2): 143-50 7. Pulmonary artery enlargement suggesting pulmonary arterial hypertension. 8.  Aortic  Atherosclerosis (ICD10-I70.0). Electronically Signed   By: Logan Bores M.D.   On: 10/11/2022 11:07   CT HEAD CODE STROKE WO CONTRAST  Result Date: 10/11/2022 CLINICAL DATA:  Code stroke. Neuro deficit, acute, stroke suspected. Difficulty speaking, right-sided weakness, and facial droop. EXAM: CT HEAD WITHOUT CONTRAST TECHNIQUE: Contiguous axial images were obtained from the base of the skull through the vertex without intravenous contrast. RADIATION DOSE REDUCTION: This exam was performed according to the departmental dose-optimization program which includes automated exposure control, adjustment of the mA and/or kV according to patient size and/or use of iterative reconstruction technique. COMPARISON:  Head CT 07/09/2020 and MRI 07/11/2020 FINDINGS: Brain:  There is no evidence of an acute infarct, intracranial hemorrhage, mass, midline shift, or extra-axial fluid collection. Chronic infarcts in the right temporal lobe, right parietal lobe, and right basal ganglia were acute in 2021. Additional chronic infarcts are again noted in the left frontal lobe, right corona radiata, and right cerebellum. Hypodensities elsewhere in the cerebral white matter bilaterally are nonspecific but compatible with mild-to-moderate chronic small vessel ischemic disease. There is mild cerebral atrophy. Vascular: Calcified atherosclerosis at the skull base. No hyperdense vessel. Skull: No acute fracture or suspicious osseous lesion. Sinuses/Orbits: Chronic sinusitis including unchanged chronic complete opacification of the left maxillary sinus with material bulging into the left nasal cavity. Moderate left greater than right ethmoid air cell mucosal thickening. Clear mastoid air cells. Bilateral cataract extraction. Other: None. ASPECTS ([place] Stroke Program Early CT Score) - Ganglionic level infarction (caudate, lentiform nuclei, internal capsule, insula, M1-M3 cortex): 7 - Supraganglionic infarction (M4-M6 cortex): 3 Total score  (0-10 with 10 being normal): 10 These results were communicated to Dr. Curly Shores at 10:42 a.m. on 10/11/2022 by text page via the [facility] messaging system. IMPRESSION: 1. No evidence of acute intracranial abnormality. ASPECTS of 10. 2. Chronic ischemia with multiple chronic infarcts as above. 3. Mild-to-moderate chronic small vessel ischemic disease. Electronically Signed   By: Logan Bores M.D.   On: 10/11/2022 10:47    Procedures Procedures    Medications Ordered in ED Medications  iohexol (OMNIPAQUE) 350 MG/ML injection 100 mL (100 mLs Intravenous Contrast Given 10/11/22 1036)    ED Course/ Medical Decision Making/ A&P                           Medical Decision Making Amount and/or Complexity of Data Reviewed Labs: ordered.   This patient presents to the ED for concern of stroke-like symptoms, this involves an extensive number of treatment options, and is a complaint that carries with it a high risk of complications and morbidity.  The differential diagnosis includes CVA, seizure, complex migraine, hypoglycemia, ICH   Co morbidities that complicate the patient evaluation  CVA, HTN, anemia, CKD, CHF, HLD, prediabetes   Additional history obtained:  Additional history obtained from EMS External records from outside source obtained and reviewed including EMR   Lab Tests:  I Ordered, and personally interpreted labs.  The pertinent results include: I-STAT shows baseline CKD, normal electrolytes, baseline anemia.  Remaining lab work pending at time of transfer.   Imaging Studies ordered:  I ordered imaging studies including CT head, CTA head and neck I independently visualized and interpreted imaging which showed mid left M2 occlusion I agree with the radiologist interpretation   Cardiac Monitoring: / EKG:  The patient was maintained on a cardiac monitor.  I personally viewed and interpreted the cardiac monitored which showed an underlying rhythm of: Sinus  rhythm   Consultations Obtained:  I requested consultation with the neurologist, Dr. Curly Shores,  and discussed lab and imaging findings as well as pertinent plan - they recommend: Transfer for IR thrombectomy   Problem List / ED Course / Critical interventions / Medication management  Patient presents for strokelike symptoms.  She reports that the onset of these was 5:30 AM this morning.  Prior to that, she woke at 4 AM in her normal state of health.  Patient has noted changes to her speech and right-sided weakness.  She arrives via EMS.  On arrival, she is alert and oriented.  She does have slurred speech but  is understandable.  She is able to describe the events of this morning.  On exam, patient has right facial droop, slurred speech, and mild right hemibody weakness.  She denies any sensory deficit.  Patient, at this point, is a reliable historian.  Given that she noticed these new symptoms at 5:30 AM, code stroke was initiated.  Patient was taken to CT scanner for code stroke imaging.  She was evaluated by a neurologist on-call, Dr. Curly Shores.  CTA imaging did reveal mid left M2 branch occlusion.  Patient is on Eliquis.  Given no availability of IR for thrombectomy at this hospital, plan, per neurology, is for transfer to outside hospital for IR intervention.  Patient was informed of this.  Neurology requested transfer.  Patient was accepted in transfer by Dr. Terrill Mohr.   Social Determinants of Health:  Lives independently   CRITICAL CARE Performed by: Godfrey Pick   Total critical care time: 35 minutes  Critical care time was exclusive of separately billable procedures and treating other patients.  Critical care was necessary to treat or prevent imminent or life-threatening deterioration.  Critical care was time spent personally by me on the following activities: development of treatment plan with patient and/or surrogate as well as nursing, discussions with consultants, evaluation of  patient's response to treatment, examination of patient, obtaining history from patient or surrogate, ordering and performing treatments and interventions, ordering and review of laboratory studies, ordering and review of radiographic studies, pulse oximetry and re-evaluation of patient's condition.          Final Clinical Impression(s) / ED Diagnoses Final diagnoses:  Cerebrovascular accident (CVA) due to occlusion of left middle cerebral artery [facility])    Rx / DC Orders ED Discharge Orders     None         Godfrey Pick, MD 10/11/22 1121

## 2022-10-11 NOTE — ED Triage Notes (Signed)
Pt BIB [facility] from home due to difficulty speaking that started at 0530 today.  Pt daughter states she last spoke to her mother 10/10/22 at [place].  Pt had right sided weakness and right sided facial droop that has subsided.  Hx of stroke with no known deficits.  Pt does take eliquis.  18g right AC.  VS BP 220/100, HR 76, CBG 125

## 2022-10-12 DIAGNOSIS — I480 Paroxysmal atrial fibrillation: Secondary | ICD-10-CM | POA: Insufficient documentation

## 2022-10-12 DIAGNOSIS — R7989 Other specified abnormal findings of blood chemistry: Secondary | ICD-10-CM | POA: Insufficient documentation

## 2022-10-12 DIAGNOSIS — G459 Transient cerebral ischemic attack, unspecified: Secondary | ICD-10-CM | POA: Diagnosis not present

## 2022-10-13 DIAGNOSIS — D649 Anemia, unspecified: Secondary | ICD-10-CM | POA: Diagnosis not present

## 2022-10-13 DIAGNOSIS — I63412 Cerebral infarction due to embolism of left middle cerebral artery: Secondary | ICD-10-CM | POA: Diagnosis not present

## 2022-10-13 DIAGNOSIS — N3001 Acute cystitis with hematuria: Secondary | ICD-10-CM | POA: Diagnosis not present

## 2022-10-13 DIAGNOSIS — N184 Chronic kidney disease, stage 4 (severe): Secondary | ICD-10-CM | POA: Diagnosis not present

## 2022-10-13 DIAGNOSIS — I480 Paroxysmal atrial fibrillation: Secondary | ICD-10-CM | POA: Diagnosis not present

## 2022-10-14 DIAGNOSIS — E559 Vitamin D deficiency, unspecified: Secondary | ICD-10-CM | POA: Insufficient documentation

## 2022-10-16 DIAGNOSIS — I69320 Aphasia following cerebral infarction: Secondary | ICD-10-CM | POA: Diagnosis not present

## 2022-10-16 DIAGNOSIS — I69351 Hemiplegia and hemiparesis following cerebral infarction affecting right dominant side: Secondary | ICD-10-CM | POA: Diagnosis not present

## 2022-10-16 DIAGNOSIS — N184 Chronic kidney disease, stage 4 (severe): Secondary | ICD-10-CM | POA: Diagnosis not present

## 2022-10-16 DIAGNOSIS — I5030 Unspecified diastolic (congestive) heart failure: Secondary | ICD-10-CM | POA: Diagnosis not present

## 2022-10-16 DIAGNOSIS — R7303 Prediabetes: Secondary | ICD-10-CM | POA: Diagnosis not present

## 2022-10-16 DIAGNOSIS — R1310 Dysphagia, unspecified: Secondary | ICD-10-CM | POA: Diagnosis not present

## 2022-10-16 DIAGNOSIS — I219 Acute myocardial infarction, unspecified: Secondary | ICD-10-CM | POA: Diagnosis not present

## 2022-10-16 DIAGNOSIS — I130 Hypertensive heart and chronic kidney disease with heart failure and stage 1 through stage 4 chronic kidney disease, or unspecified chronic kidney disease: Secondary | ICD-10-CM | POA: Diagnosis not present

## 2022-10-16 DIAGNOSIS — Z556 Problems related to health literacy: Secondary | ICD-10-CM | POA: Diagnosis not present

## 2022-10-16 DIAGNOSIS — Z7901 Long term (current) use of anticoagulants: Secondary | ICD-10-CM | POA: Diagnosis not present

## 2022-10-16 DIAGNOSIS — E559 Vitamin D deficiency, unspecified: Secondary | ICD-10-CM | POA: Diagnosis not present

## 2022-10-16 DIAGNOSIS — I2510 Atherosclerotic heart disease of native coronary artery without angina pectoris: Secondary | ICD-10-CM | POA: Diagnosis not present

## 2022-10-16 DIAGNOSIS — N3001 Acute cystitis with hematuria: Secondary | ICD-10-CM | POA: Diagnosis not present

## 2022-10-16 DIAGNOSIS — E7800 Pure hypercholesterolemia, unspecified: Secondary | ICD-10-CM | POA: Diagnosis not present

## 2022-10-16 DIAGNOSIS — D631 Anemia in chronic kidney disease: Secondary | ICD-10-CM | POA: Diagnosis not present

## 2022-10-16 DIAGNOSIS — I480 Paroxysmal atrial fibrillation: Secondary | ICD-10-CM | POA: Diagnosis not present

## 2022-10-18 DIAGNOSIS — I2510 Atherosclerotic heart disease of native coronary artery without angina pectoris: Secondary | ICD-10-CM | POA: Diagnosis not present

## 2022-10-18 DIAGNOSIS — I69320 Aphasia following cerebral infarction: Secondary | ICD-10-CM | POA: Diagnosis not present

## 2022-10-18 DIAGNOSIS — I130 Hypertensive heart and chronic kidney disease with heart failure and stage 1 through stage 4 chronic kidney disease, or unspecified chronic kidney disease: Secondary | ICD-10-CM | POA: Diagnosis not present

## 2022-10-18 DIAGNOSIS — E559 Vitamin D deficiency, unspecified: Secondary | ICD-10-CM | POA: Diagnosis not present

## 2022-10-18 DIAGNOSIS — I69351 Hemiplegia and hemiparesis following cerebral infarction affecting right dominant side: Secondary | ICD-10-CM | POA: Diagnosis not present

## 2022-10-18 DIAGNOSIS — N3001 Acute cystitis with hematuria: Secondary | ICD-10-CM | POA: Diagnosis not present

## 2022-10-18 DIAGNOSIS — Z7901 Long term (current) use of anticoagulants: Secondary | ICD-10-CM | POA: Diagnosis not present

## 2022-10-18 DIAGNOSIS — Z556 Problems related to health literacy: Secondary | ICD-10-CM | POA: Diagnosis not present

## 2022-10-18 DIAGNOSIS — I480 Paroxysmal atrial fibrillation: Secondary | ICD-10-CM | POA: Diagnosis not present

## 2022-10-18 DIAGNOSIS — E7800 Pure hypercholesterolemia, unspecified: Secondary | ICD-10-CM | POA: Diagnosis not present

## 2022-10-18 DIAGNOSIS — R7303 Prediabetes: Secondary | ICD-10-CM | POA: Diagnosis not present

## 2022-10-18 DIAGNOSIS — R1310 Dysphagia, unspecified: Secondary | ICD-10-CM | POA: Diagnosis not present

## 2022-10-18 DIAGNOSIS — I219 Acute myocardial infarction, unspecified: Secondary | ICD-10-CM | POA: Diagnosis not present

## 2022-10-18 DIAGNOSIS — I5030 Unspecified diastolic (congestive) heart failure: Secondary | ICD-10-CM | POA: Diagnosis not present

## 2022-10-18 DIAGNOSIS — D631 Anemia in chronic kidney disease: Secondary | ICD-10-CM | POA: Diagnosis not present

## 2022-10-18 DIAGNOSIS — N184 Chronic kidney disease, stage 4 (severe): Secondary | ICD-10-CM | POA: Diagnosis not present

## 2022-10-20 DIAGNOSIS — E559 Vitamin D deficiency, unspecified: Secondary | ICD-10-CM | POA: Diagnosis not present

## 2022-10-20 DIAGNOSIS — N184 Chronic kidney disease, stage 4 (severe): Secondary | ICD-10-CM | POA: Diagnosis not present

## 2022-10-20 DIAGNOSIS — R7303 Prediabetes: Secondary | ICD-10-CM | POA: Diagnosis not present

## 2022-10-20 DIAGNOSIS — I69351 Hemiplegia and hemiparesis following cerebral infarction affecting right dominant side: Secondary | ICD-10-CM | POA: Diagnosis not present

## 2022-10-20 DIAGNOSIS — E7800 Pure hypercholesterolemia, unspecified: Secondary | ICD-10-CM | POA: Diagnosis not present

## 2022-10-20 DIAGNOSIS — Z556 Problems related to health literacy: Secondary | ICD-10-CM | POA: Diagnosis not present

## 2022-10-20 DIAGNOSIS — I5030 Unspecified diastolic (congestive) heart failure: Secondary | ICD-10-CM | POA: Diagnosis not present

## 2022-10-20 DIAGNOSIS — D631 Anemia in chronic kidney disease: Secondary | ICD-10-CM | POA: Diagnosis not present

## 2022-10-20 DIAGNOSIS — N3001 Acute cystitis with hematuria: Secondary | ICD-10-CM | POA: Diagnosis not present

## 2022-10-20 DIAGNOSIS — I130 Hypertensive heart and chronic kidney disease with heart failure and stage 1 through stage 4 chronic kidney disease, or unspecified chronic kidney disease: Secondary | ICD-10-CM | POA: Diagnosis not present

## 2022-10-20 DIAGNOSIS — I480 Paroxysmal atrial fibrillation: Secondary | ICD-10-CM | POA: Diagnosis not present

## 2022-10-20 DIAGNOSIS — I2510 Atherosclerotic heart disease of native coronary artery without angina pectoris: Secondary | ICD-10-CM | POA: Diagnosis not present

## 2022-10-20 DIAGNOSIS — I69320 Aphasia following cerebral infarction: Secondary | ICD-10-CM | POA: Diagnosis not present

## 2022-10-20 DIAGNOSIS — Z7901 Long term (current) use of anticoagulants: Secondary | ICD-10-CM | POA: Diagnosis not present

## 2022-10-20 DIAGNOSIS — I219 Acute myocardial infarction, unspecified: Secondary | ICD-10-CM | POA: Diagnosis not present

## 2022-10-20 DIAGNOSIS — R1310 Dysphagia, unspecified: Secondary | ICD-10-CM | POA: Diagnosis not present

## 2022-10-22 ENCOUNTER — Telehealth: Payer: Self-pay | Admitting: Critical Care Medicine

## 2022-10-22 DIAGNOSIS — I6930 Unspecified sequelae of cerebral infarction: Secondary | ICD-10-CM

## 2022-10-22 NOTE — Telephone Encounter (Signed)
Home Health Verbal Orders - Caller/Agency: taylor with [facility]  Callback Number: [phone]  Requesting OT/PT/Skilled Nursing/Social Work/Speech Therapy: Speech therapy  Frequency: 1week 6 for short term memory and problem solving skills

## 2022-10-22 NOTE — Telephone Encounter (Signed)
Jillian [name] the freqency in this message is totally unclear

## 2022-10-26 NOTE — Telephone Encounter (Signed)
I spoke to [employer], [employer].  She was concerned about seeing the patient prior to being seen by PCP. They received initial orders for services then the patient was discharged from the hospital.  I gave her the verbal orders for 1x/week x 6 weeks. She plans to see the patient tomorrow. I explained that it is very important that a provider see the patient within 30 days of their start of care.  She has not been seen at this clinic in 8 months and she has an appointment with Epimenio Sarin, PA on 11/11/2022.  Lovena Le said she will re-enforce with the patient/ family that they need to attend the appointment on 12/28. If they are not able to come in person, the visit could be switched to virtual through [facility].  It cannot be a telephone call only visit.  She said she understood and will explain to patient/ family

## 2022-10-26 NOTE — Telephone Encounter (Signed)
For Jillian [name] from [facility] has called, visit was on for tomorrow, She wants a FU call before visit so she is pushing out a day or so just to make sure, would like to touch base with you, secure line is [phone]

## 2022-10-26 NOTE — Addendum Note (Signed)
Addended by: [name] on: 10/26/2022 05:18 AM   Modules accepted: Orders

## 2022-10-26 NOTE — Telephone Encounter (Addendum)
Jillian [name] I placed order likely printed, note goes to [facility].   Note just d/c 12/1 and I have yet to see Toc visit so hope can be accepted with DC note from [facility], can you send that with this order

## 2022-10-27 DIAGNOSIS — I130 Hypertensive heart and chronic kidney disease with heart failure and stage 1 through stage 4 chronic kidney disease, or unspecified chronic kidney disease: Secondary | ICD-10-CM | POA: Diagnosis not present

## 2022-10-27 DIAGNOSIS — Z7901 Long term (current) use of anticoagulants: Secondary | ICD-10-CM | POA: Diagnosis not present

## 2022-10-27 DIAGNOSIS — I480 Paroxysmal atrial fibrillation: Secondary | ICD-10-CM | POA: Diagnosis not present

## 2022-10-27 DIAGNOSIS — D631 Anemia in chronic kidney disease: Secondary | ICD-10-CM | POA: Diagnosis not present

## 2022-10-27 DIAGNOSIS — I219 Acute myocardial infarction, unspecified: Secondary | ICD-10-CM | POA: Diagnosis not present

## 2022-10-27 DIAGNOSIS — R1310 Dysphagia, unspecified: Secondary | ICD-10-CM | POA: Diagnosis not present

## 2022-10-27 DIAGNOSIS — I5030 Unspecified diastolic (congestive) heart failure: Secondary | ICD-10-CM | POA: Diagnosis not present

## 2022-10-27 DIAGNOSIS — Z556 Problems related to health literacy: Secondary | ICD-10-CM | POA: Diagnosis not present

## 2022-10-27 DIAGNOSIS — E559 Vitamin D deficiency, unspecified: Secondary | ICD-10-CM | POA: Diagnosis not present

## 2022-10-27 DIAGNOSIS — E7800 Pure hypercholesterolemia, unspecified: Secondary | ICD-10-CM | POA: Diagnosis not present

## 2022-10-27 DIAGNOSIS — N3001 Acute cystitis with hematuria: Secondary | ICD-10-CM | POA: Diagnosis not present

## 2022-10-27 DIAGNOSIS — N184 Chronic kidney disease, stage 4 (severe): Secondary | ICD-10-CM | POA: Diagnosis not present

## 2022-10-27 DIAGNOSIS — R7303 Prediabetes: Secondary | ICD-10-CM | POA: Diagnosis not present

## 2022-10-27 DIAGNOSIS — I2510 Atherosclerotic heart disease of native coronary artery without angina pectoris: Secondary | ICD-10-CM | POA: Diagnosis not present

## 2022-10-27 DIAGNOSIS — I69320 Aphasia following cerebral infarction: Secondary | ICD-10-CM | POA: Diagnosis not present

## 2022-10-27 DIAGNOSIS — I69351 Hemiplegia and hemiparesis following cerebral infarction affecting right dominant side: Secondary | ICD-10-CM | POA: Diagnosis not present

## 2022-10-28 DIAGNOSIS — I219 Acute myocardial infarction, unspecified: Secondary | ICD-10-CM | POA: Diagnosis not present

## 2022-10-28 DIAGNOSIS — I69320 Aphasia following cerebral infarction: Secondary | ICD-10-CM | POA: Diagnosis not present

## 2022-10-28 DIAGNOSIS — I480 Paroxysmal atrial fibrillation: Secondary | ICD-10-CM | POA: Diagnosis not present

## 2022-10-28 DIAGNOSIS — D631 Anemia in chronic kidney disease: Secondary | ICD-10-CM | POA: Diagnosis not present

## 2022-10-28 DIAGNOSIS — E559 Vitamin D deficiency, unspecified: Secondary | ICD-10-CM | POA: Diagnosis not present

## 2022-10-28 DIAGNOSIS — R1310 Dysphagia, unspecified: Secondary | ICD-10-CM | POA: Diagnosis not present

## 2022-10-28 DIAGNOSIS — I5030 Unspecified diastolic (congestive) heart failure: Secondary | ICD-10-CM | POA: Diagnosis not present

## 2022-10-28 DIAGNOSIS — Z7901 Long term (current) use of anticoagulants: Secondary | ICD-10-CM | POA: Diagnosis not present

## 2022-10-28 DIAGNOSIS — I2510 Atherosclerotic heart disease of native coronary artery without angina pectoris: Secondary | ICD-10-CM | POA: Diagnosis not present

## 2022-10-28 DIAGNOSIS — E7800 Pure hypercholesterolemia, unspecified: Secondary | ICD-10-CM | POA: Diagnosis not present

## 2022-10-28 DIAGNOSIS — I130 Hypertensive heart and chronic kidney disease with heart failure and stage 1 through stage 4 chronic kidney disease, or unspecified chronic kidney disease: Secondary | ICD-10-CM | POA: Diagnosis not present

## 2022-10-28 DIAGNOSIS — R7303 Prediabetes: Secondary | ICD-10-CM | POA: Diagnosis not present

## 2022-10-28 DIAGNOSIS — N184 Chronic kidney disease, stage 4 (severe): Secondary | ICD-10-CM | POA: Diagnosis not present

## 2022-10-28 DIAGNOSIS — I69351 Hemiplegia and hemiparesis following cerebral infarction affecting right dominant side: Secondary | ICD-10-CM | POA: Diagnosis not present

## 2022-10-28 DIAGNOSIS — Z556 Problems related to health literacy: Secondary | ICD-10-CM | POA: Diagnosis not present

## 2022-10-28 DIAGNOSIS — N3001 Acute cystitis with hematuria: Secondary | ICD-10-CM | POA: Diagnosis not present

## 2022-10-29 NOTE — Telephone Encounter (Signed)
noted 

## 2022-11-02 DIAGNOSIS — I2510 Atherosclerotic heart disease of native coronary artery without angina pectoris: Secondary | ICD-10-CM | POA: Diagnosis not present

## 2022-11-02 DIAGNOSIS — Z556 Problems related to health literacy: Secondary | ICD-10-CM | POA: Diagnosis not present

## 2022-11-02 DIAGNOSIS — I480 Paroxysmal atrial fibrillation: Secondary | ICD-10-CM | POA: Diagnosis not present

## 2022-11-02 DIAGNOSIS — I5030 Unspecified diastolic (congestive) heart failure: Secondary | ICD-10-CM | POA: Diagnosis not present

## 2022-11-02 DIAGNOSIS — N184 Chronic kidney disease, stage 4 (severe): Secondary | ICD-10-CM | POA: Diagnosis not present

## 2022-11-02 DIAGNOSIS — I219 Acute myocardial infarction, unspecified: Secondary | ICD-10-CM | POA: Diagnosis not present

## 2022-11-02 DIAGNOSIS — E7800 Pure hypercholesterolemia, unspecified: Secondary | ICD-10-CM | POA: Diagnosis not present

## 2022-11-02 DIAGNOSIS — Z7901 Long term (current) use of anticoagulants: Secondary | ICD-10-CM | POA: Diagnosis not present

## 2022-11-02 DIAGNOSIS — E559 Vitamin D deficiency, unspecified: Secondary | ICD-10-CM | POA: Diagnosis not present

## 2022-11-02 DIAGNOSIS — I69351 Hemiplegia and hemiparesis following cerebral infarction affecting right dominant side: Secondary | ICD-10-CM | POA: Diagnosis not present

## 2022-11-02 DIAGNOSIS — R7303 Prediabetes: Secondary | ICD-10-CM | POA: Diagnosis not present

## 2022-11-02 DIAGNOSIS — D631 Anemia in chronic kidney disease: Secondary | ICD-10-CM | POA: Diagnosis not present

## 2022-11-02 DIAGNOSIS — N3001 Acute cystitis with hematuria: Secondary | ICD-10-CM | POA: Diagnosis not present

## 2022-11-02 DIAGNOSIS — R1310 Dysphagia, unspecified: Secondary | ICD-10-CM | POA: Diagnosis not present

## 2022-11-02 DIAGNOSIS — I69320 Aphasia following cerebral infarction: Secondary | ICD-10-CM | POA: Diagnosis not present

## 2022-11-02 DIAGNOSIS — I130 Hypertensive heart and chronic kidney disease with heart failure and stage 1 through stage 4 chronic kidney disease, or unspecified chronic kidney disease: Secondary | ICD-10-CM | POA: Diagnosis not present

## 2022-11-03 DIAGNOSIS — R7303 Prediabetes: Secondary | ICD-10-CM | POA: Diagnosis not present

## 2022-11-03 DIAGNOSIS — N184 Chronic kidney disease, stage 4 (severe): Secondary | ICD-10-CM | POA: Diagnosis not present

## 2022-11-03 DIAGNOSIS — I130 Hypertensive heart and chronic kidney disease with heart failure and stage 1 through stage 4 chronic kidney disease, or unspecified chronic kidney disease: Secondary | ICD-10-CM | POA: Diagnosis not present

## 2022-11-03 DIAGNOSIS — I69320 Aphasia following cerebral infarction: Secondary | ICD-10-CM | POA: Diagnosis not present

## 2022-11-03 DIAGNOSIS — I2510 Atherosclerotic heart disease of native coronary artery without angina pectoris: Secondary | ICD-10-CM | POA: Diagnosis not present

## 2022-11-03 DIAGNOSIS — I219 Acute myocardial infarction, unspecified: Secondary | ICD-10-CM | POA: Diagnosis not present

## 2022-11-03 DIAGNOSIS — D631 Anemia in chronic kidney disease: Secondary | ICD-10-CM | POA: Diagnosis not present

## 2022-11-03 DIAGNOSIS — I69351 Hemiplegia and hemiparesis following cerebral infarction affecting right dominant side: Secondary | ICD-10-CM | POA: Diagnosis not present

## 2022-11-03 DIAGNOSIS — E559 Vitamin D deficiency, unspecified: Secondary | ICD-10-CM | POA: Diagnosis not present

## 2022-11-03 DIAGNOSIS — Z556 Problems related to health literacy: Secondary | ICD-10-CM | POA: Diagnosis not present

## 2022-11-03 DIAGNOSIS — E7800 Pure hypercholesterolemia, unspecified: Secondary | ICD-10-CM | POA: Diagnosis not present

## 2022-11-03 DIAGNOSIS — N3001 Acute cystitis with hematuria: Secondary | ICD-10-CM | POA: Diagnosis not present

## 2022-11-03 DIAGNOSIS — Z7901 Long term (current) use of anticoagulants: Secondary | ICD-10-CM | POA: Diagnosis not present

## 2022-11-03 DIAGNOSIS — R1310 Dysphagia, unspecified: Secondary | ICD-10-CM | POA: Diagnosis not present

## 2022-11-03 DIAGNOSIS — I5030 Unspecified diastolic (congestive) heart failure: Secondary | ICD-10-CM | POA: Diagnosis not present

## 2022-11-03 DIAGNOSIS — I480 Paroxysmal atrial fibrillation: Secondary | ICD-10-CM | POA: Diagnosis not present

## 2022-11-05 DIAGNOSIS — Z556 Problems related to health literacy: Secondary | ICD-10-CM | POA: Diagnosis not present

## 2022-11-05 DIAGNOSIS — Z7901 Long term (current) use of anticoagulants: Secondary | ICD-10-CM | POA: Diagnosis not present

## 2022-11-05 DIAGNOSIS — N184 Chronic kidney disease, stage 4 (severe): Secondary | ICD-10-CM | POA: Diagnosis not present

## 2022-11-05 DIAGNOSIS — I2510 Atherosclerotic heart disease of native coronary artery without angina pectoris: Secondary | ICD-10-CM | POA: Diagnosis not present

## 2022-11-05 DIAGNOSIS — E7800 Pure hypercholesterolemia, unspecified: Secondary | ICD-10-CM | POA: Diagnosis not present

## 2022-11-05 DIAGNOSIS — R1310 Dysphagia, unspecified: Secondary | ICD-10-CM | POA: Diagnosis not present

## 2022-11-05 DIAGNOSIS — I5030 Unspecified diastolic (congestive) heart failure: Secondary | ICD-10-CM | POA: Diagnosis not present

## 2022-11-05 DIAGNOSIS — R7303 Prediabetes: Secondary | ICD-10-CM | POA: Diagnosis not present

## 2022-11-05 DIAGNOSIS — I480 Paroxysmal atrial fibrillation: Secondary | ICD-10-CM | POA: Diagnosis not present

## 2022-11-05 DIAGNOSIS — I69320 Aphasia following cerebral infarction: Secondary | ICD-10-CM | POA: Diagnosis not present

## 2022-11-05 DIAGNOSIS — I130 Hypertensive heart and chronic kidney disease with heart failure and stage 1 through stage 4 chronic kidney disease, or unspecified chronic kidney disease: Secondary | ICD-10-CM | POA: Diagnosis not present

## 2022-11-05 DIAGNOSIS — I219 Acute myocardial infarction, unspecified: Secondary | ICD-10-CM | POA: Diagnosis not present

## 2022-11-05 DIAGNOSIS — E559 Vitamin D deficiency, unspecified: Secondary | ICD-10-CM | POA: Diagnosis not present

## 2022-11-05 DIAGNOSIS — D631 Anemia in chronic kidney disease: Secondary | ICD-10-CM | POA: Diagnosis not present

## 2022-11-05 DIAGNOSIS — N3001 Acute cystitis with hematuria: Secondary | ICD-10-CM | POA: Diagnosis not present

## 2022-11-05 DIAGNOSIS — I69351 Hemiplegia and hemiparesis following cerebral infarction affecting right dominant side: Secondary | ICD-10-CM | POA: Diagnosis not present

## 2022-11-09 ENCOUNTER — Encounter: Payer: Self-pay | Admitting: Podiatry

## 2022-11-10 ENCOUNTER — Ambulatory Visit: Payer: Medicare Other | Admitting: Podiatry
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
## 2022-11-11 ENCOUNTER — Ambulatory Visit: Payer: Medicare Other | Attending: Physician Assistant | Admitting: Physician Assistant

## 2022-11-11 VITALS — BP 112/77 | HR 85 | Temp 97.8°F | Resp 16 | Ht 61.0 in | Wt 180.0 lb

## 2022-11-11 DIAGNOSIS — I480 Paroxysmal atrial fibrillation: Secondary | ICD-10-CM

## 2022-11-11 DIAGNOSIS — I10 Essential (primary) hypertension: Secondary | ICD-10-CM | POA: Diagnosis not present

## 2022-11-11 DIAGNOSIS — Z09 Encounter for follow-up examination after completed treatment for conditions other than malignant neoplasm: Secondary | ICD-10-CM

## 2022-11-11 DIAGNOSIS — I669 Occlusion and stenosis of unspecified cerebral artery: Secondary | ICD-10-CM

## 2022-11-11 DIAGNOSIS — Z8673 Personal history of transient ischemic attack (TIA), and cerebral infarction without residual deficits: Secondary | ICD-10-CM

## 2022-11-11 DIAGNOSIS — R21 Rash and other nonspecific skin eruption: Secondary | ICD-10-CM | POA: Diagnosis not present

## 2022-11-11 DIAGNOSIS — E782 Mixed hyperlipidemia: Secondary | ICD-10-CM | POA: Diagnosis not present

## 2022-11-11 MED ORDER — CLOTRIMAZOLE-BETAMETHASONE 1-0.05 % EX CREA: TOPICAL_CREAM | Freq: Two times a day (BID) | CUTANEOUS | 1 refills | Status: DC

## 2022-11-11 MED ORDER — ROSUVASTATIN CALCIUM 10 MG PO TABS: 10.0000 mg | ORAL_TABLET | Freq: Every day | ORAL | 0 refills | Status: DC

## 2022-11-11 MED ORDER — FUROSEMIDE 20 MG PO TABS: 60.0000 mg | ORAL_TABLET | Freq: Every morning | ORAL | 2 refills | Status: DC

## 2022-11-11 MED ORDER — CARVEDILOL 25 MG PO TABS: 25.0000 mg | ORAL_TABLET | Freq: Two times a day (BID) | ORAL | 6 refills | Status: DC

## 2022-11-11 MED ORDER — HYDRALAZINE HCL 50 MG PO TABS: 50.0000 mg | ORAL_TABLET | Freq: Three times a day (TID) | ORAL | 2 refills | Status: DC

## 2022-11-11 MED ORDER — APIXABAN 2.5 MG PO TABS: 2.5000 mg | ORAL_TABLET | Freq: Two times a day (BID) | ORAL | 6 refills | Status: DC

## 2022-11-11 NOTE — Progress Notes (Signed)
Patient is here for hfu. Patient said she is doing well and happy that her bp was good today. Patient has recurrent yeast around her abdomen area.

## 2022-11-12 ENCOUNTER — Telehealth: Payer: Self-pay | Admitting: Critical Care Medicine

## 2022-11-12 ENCOUNTER — Telehealth: Payer: Self-pay

## 2022-11-12 DIAGNOSIS — I130 Hypertensive heart and chronic kidney disease with heart failure and stage 1 through stage 4 chronic kidney disease, or unspecified chronic kidney disease: Secondary | ICD-10-CM | POA: Diagnosis not present

## 2022-11-12 DIAGNOSIS — I5030 Unspecified diastolic (congestive) heart failure: Secondary | ICD-10-CM | POA: Diagnosis not present

## 2022-11-12 DIAGNOSIS — Z7901 Long term (current) use of anticoagulants: Secondary | ICD-10-CM | POA: Diagnosis not present

## 2022-11-12 DIAGNOSIS — I2510 Atherosclerotic heart disease of native coronary artery without angina pectoris: Secondary | ICD-10-CM | POA: Diagnosis not present

## 2022-11-12 DIAGNOSIS — R1310 Dysphagia, unspecified: Secondary | ICD-10-CM | POA: Diagnosis not present

## 2022-11-12 DIAGNOSIS — E7800 Pure hypercholesterolemia, unspecified: Secondary | ICD-10-CM | POA: Diagnosis not present

## 2022-11-12 DIAGNOSIS — I69320 Aphasia following cerebral infarction: Secondary | ICD-10-CM | POA: Diagnosis not present

## 2022-11-12 DIAGNOSIS — N3001 Acute cystitis with hematuria: Secondary | ICD-10-CM | POA: Diagnosis not present

## 2022-11-12 DIAGNOSIS — I219 Acute myocardial infarction, unspecified: Secondary | ICD-10-CM | POA: Diagnosis not present

## 2022-11-12 DIAGNOSIS — N184 Chronic kidney disease, stage 4 (severe): Secondary | ICD-10-CM | POA: Diagnosis not present

## 2022-11-12 DIAGNOSIS — I480 Paroxysmal atrial fibrillation: Secondary | ICD-10-CM | POA: Diagnosis not present

## 2022-11-12 DIAGNOSIS — Z556 Problems related to health literacy: Secondary | ICD-10-CM | POA: Diagnosis not present

## 2022-11-12 DIAGNOSIS — R7303 Prediabetes: Secondary | ICD-10-CM | POA: Diagnosis not present

## 2022-11-12 DIAGNOSIS — E559 Vitamin D deficiency, unspecified: Secondary | ICD-10-CM | POA: Diagnosis not present

## 2022-11-12 DIAGNOSIS — I69351 Hemiplegia and hemiparesis following cerebral infarction affecting right dominant side: Secondary | ICD-10-CM | POA: Diagnosis not present

## 2022-11-12 DIAGNOSIS — D631 Anemia in chronic kidney disease: Secondary | ICD-10-CM | POA: Diagnosis not present

## 2022-11-12 NOTE — Telephone Encounter (Signed)
At the request of Epimenio Sarin, [place] I attempted to call patient [phone]  to  inquire if she is in need of any community resources.  Voicemail was full and the name on the  voicemail is her daughter, [name].

## 2022-11-12 NOTE — Telephone Encounter (Signed)
Verbal orders were left on VM.

## 2022-11-12 NOTE — Telephone Encounter (Signed)
Home Health Verbal Orders - Caller/Agency: [facility]  Callback Number: [phone] vm can be left  Requesting OT Frequency: 1x a week for 3 weeks

## 2022-11-16 DIAGNOSIS — R7303 Prediabetes: Secondary | ICD-10-CM | POA: Diagnosis not present

## 2022-11-16 DIAGNOSIS — I5030 Unspecified diastolic (congestive) heart failure: Secondary | ICD-10-CM | POA: Diagnosis not present

## 2022-11-16 DIAGNOSIS — N3001 Acute cystitis with hematuria: Secondary | ICD-10-CM | POA: Diagnosis not present

## 2022-11-16 DIAGNOSIS — R1310 Dysphagia, unspecified: Secondary | ICD-10-CM | POA: Diagnosis not present

## 2022-11-16 DIAGNOSIS — D631 Anemia in chronic kidney disease: Secondary | ICD-10-CM | POA: Diagnosis not present

## 2022-11-16 DIAGNOSIS — E559 Vitamin D deficiency, unspecified: Secondary | ICD-10-CM | POA: Diagnosis not present

## 2022-11-16 DIAGNOSIS — E7800 Pure hypercholesterolemia, unspecified: Secondary | ICD-10-CM | POA: Diagnosis not present

## 2022-11-16 DIAGNOSIS — I480 Paroxysmal atrial fibrillation: Secondary | ICD-10-CM | POA: Diagnosis not present

## 2022-11-16 DIAGNOSIS — N184 Chronic kidney disease, stage 4 (severe): Secondary | ICD-10-CM | POA: Diagnosis not present

## 2022-11-16 DIAGNOSIS — Z7901 Long term (current) use of anticoagulants: Secondary | ICD-10-CM | POA: Diagnosis not present

## 2022-11-16 DIAGNOSIS — I2510 Atherosclerotic heart disease of native coronary artery without angina pectoris: Secondary | ICD-10-CM | POA: Diagnosis not present

## 2022-11-16 DIAGNOSIS — Z556 Problems related to health literacy: Secondary | ICD-10-CM | POA: Diagnosis not present

## 2022-11-16 DIAGNOSIS — I219 Acute myocardial infarction, unspecified: Secondary | ICD-10-CM | POA: Diagnosis not present

## 2022-11-16 DIAGNOSIS — I69351 Hemiplegia and hemiparesis following cerebral infarction affecting right dominant side: Secondary | ICD-10-CM | POA: Diagnosis not present

## 2022-11-16 DIAGNOSIS — I69320 Aphasia following cerebral infarction: Secondary | ICD-10-CM | POA: Diagnosis not present

## 2022-11-16 DIAGNOSIS — I130 Hypertensive heart and chronic kidney disease with heart failure and stage 1 through stage 4 chronic kidney disease, or unspecified chronic kidney disease: Secondary | ICD-10-CM | POA: Diagnosis not present

## 2022-11-17 DIAGNOSIS — R1310 Dysphagia, unspecified: Secondary | ICD-10-CM | POA: Diagnosis not present

## 2022-11-17 DIAGNOSIS — Z556 Problems related to health literacy: Secondary | ICD-10-CM | POA: Diagnosis not present

## 2022-11-17 DIAGNOSIS — I130 Hypertensive heart and chronic kidney disease with heart failure and stage 1 through stage 4 chronic kidney disease, or unspecified chronic kidney disease: Secondary | ICD-10-CM | POA: Diagnosis not present

## 2022-11-17 DIAGNOSIS — E559 Vitamin D deficiency, unspecified: Secondary | ICD-10-CM | POA: Diagnosis not present

## 2022-11-17 DIAGNOSIS — I2510 Atherosclerotic heart disease of native coronary artery without angina pectoris: Secondary | ICD-10-CM | POA: Diagnosis not present

## 2022-11-17 DIAGNOSIS — I69320 Aphasia following cerebral infarction: Secondary | ICD-10-CM | POA: Diagnosis not present

## 2022-11-17 DIAGNOSIS — I5030 Unspecified diastolic (congestive) heart failure: Secondary | ICD-10-CM | POA: Diagnosis not present

## 2022-11-17 DIAGNOSIS — I219 Acute myocardial infarction, unspecified: Secondary | ICD-10-CM | POA: Diagnosis not present

## 2022-11-17 DIAGNOSIS — N3001 Acute cystitis with hematuria: Secondary | ICD-10-CM | POA: Diagnosis not present

## 2022-11-17 DIAGNOSIS — E7800 Pure hypercholesterolemia, unspecified: Secondary | ICD-10-CM | POA: Diagnosis not present

## 2022-11-17 DIAGNOSIS — I480 Paroxysmal atrial fibrillation: Secondary | ICD-10-CM | POA: Diagnosis not present

## 2022-11-17 DIAGNOSIS — D631 Anemia in chronic kidney disease: Secondary | ICD-10-CM | POA: Diagnosis not present

## 2022-11-17 DIAGNOSIS — I69351 Hemiplegia and hemiparesis following cerebral infarction affecting right dominant side: Secondary | ICD-10-CM | POA: Diagnosis not present

## 2022-11-17 DIAGNOSIS — R7303 Prediabetes: Secondary | ICD-10-CM | POA: Diagnosis not present

## 2022-11-17 DIAGNOSIS — N184 Chronic kidney disease, stage 4 (severe): Secondary | ICD-10-CM | POA: Diagnosis not present

## 2022-11-17 DIAGNOSIS — Z7901 Long term (current) use of anticoagulants: Secondary | ICD-10-CM | POA: Diagnosis not present

## 2022-11-19 ENCOUNTER — Ambulatory Visit: Payer: Medicare Other | Admitting: Podiatry

## 2022-11-26 ENCOUNTER — Telehealth: Payer: Self-pay | Admitting: Critical Care Medicine

## 2022-11-26 DIAGNOSIS — N3001 Acute cystitis with hematuria: Secondary | ICD-10-CM | POA: Diagnosis not present

## 2022-11-26 DIAGNOSIS — D631 Anemia in chronic kidney disease: Secondary | ICD-10-CM | POA: Diagnosis not present

## 2022-11-26 DIAGNOSIS — R7303 Prediabetes: Secondary | ICD-10-CM | POA: Diagnosis not present

## 2022-11-26 DIAGNOSIS — E876 Hypokalemia: Secondary | ICD-10-CM | POA: Diagnosis not present

## 2022-11-26 DIAGNOSIS — I2510 Atherosclerotic heart disease of native coronary artery without angina pectoris: Secondary | ICD-10-CM | POA: Diagnosis not present

## 2022-11-26 DIAGNOSIS — Z7901 Long term (current) use of anticoagulants: Secondary | ICD-10-CM | POA: Diagnosis not present

## 2022-11-26 DIAGNOSIS — I69320 Aphasia following cerebral infarction: Secondary | ICD-10-CM | POA: Diagnosis not present

## 2022-11-26 DIAGNOSIS — I219 Acute myocardial infarction, unspecified: Secondary | ICD-10-CM | POA: Diagnosis not present

## 2022-11-26 DIAGNOSIS — I69351 Hemiplegia and hemiparesis following cerebral infarction affecting right dominant side: Secondary | ICD-10-CM | POA: Diagnosis not present

## 2022-11-26 DIAGNOSIS — I129 Hypertensive chronic kidney disease with stage 1 through stage 4 chronic kidney disease, or unspecified chronic kidney disease: Secondary | ICD-10-CM | POA: Diagnosis not present

## 2022-11-26 DIAGNOSIS — I130 Hypertensive heart and chronic kidney disease with heart failure and stage 1 through stage 4 chronic kidney disease, or unspecified chronic kidney disease: Secondary | ICD-10-CM | POA: Diagnosis not present

## 2022-11-26 DIAGNOSIS — E559 Vitamin D deficiency, unspecified: Secondary | ICD-10-CM | POA: Diagnosis not present

## 2022-11-26 DIAGNOSIS — R1310 Dysphagia, unspecified: Secondary | ICD-10-CM | POA: Diagnosis not present

## 2022-11-26 DIAGNOSIS — N184 Chronic kidney disease, stage 4 (severe): Secondary | ICD-10-CM | POA: Diagnosis not present

## 2022-11-26 DIAGNOSIS — E785 Hyperlipidemia, unspecified: Secondary | ICD-10-CM | POA: Diagnosis not present

## 2022-11-26 DIAGNOSIS — I480 Paroxysmal atrial fibrillation: Secondary | ICD-10-CM | POA: Diagnosis not present

## 2022-11-26 DIAGNOSIS — Z556 Problems related to health literacy: Secondary | ICD-10-CM | POA: Diagnosis not present

## 2022-11-26 DIAGNOSIS — I5030 Unspecified diastolic (congestive) heart failure: Secondary | ICD-10-CM | POA: Diagnosis not present

## 2022-11-26 DIAGNOSIS — E7800 Pure hypercholesterolemia, unspecified: Secondary | ICD-10-CM | POA: Diagnosis not present

## 2022-11-26 NOTE — Telephone Encounter (Signed)
Copied from [place] [id number]. Topic: General - Inquiry >> Nov 26, 2022  3:52 PM Erskine Squibb wrote: Reason for CRM: Lysbeth Galas with [facility] called in stating she previously sent an order for a bedside commode but hadn't heard back and is going to resend via fax. Please call her at [phone] to assist further as patient is back home as of last night and really needs this asap.

## 2022-11-28 ENCOUNTER — Inpatient Hospital Stay (HOSPITAL_COMMUNITY)
Admission: EM | Admit: 2022-11-28 | Discharge: 2022-12-08 | DRG: 291 | Disposition: A | Discharge status: Hospice | Payer: Medicare Other | Attending: Internal Medicine | Admitting: Internal Medicine

## 2022-11-28 ENCOUNTER — Emergency Department (HOSPITAL_COMMUNITY): Payer: Medicare Other

## 2022-11-28 DIAGNOSIS — Z7901 Long term (current) use of anticoagulants: Secondary | ICD-10-CM

## 2022-11-28 DIAGNOSIS — I5043 Acute on chronic combined systolic (congestive) and diastolic (congestive) heart failure: Secondary | ICD-10-CM | POA: Diagnosis not present

## 2022-11-28 DIAGNOSIS — R001 Bradycardia, unspecified: Secondary | ICD-10-CM | POA: Diagnosis not present

## 2022-11-28 DIAGNOSIS — E872 Acidosis, unspecified: Secondary | ICD-10-CM | POA: Diagnosis not present

## 2022-11-28 DIAGNOSIS — E669 Obesity, unspecified: Secondary | ICD-10-CM | POA: Diagnosis present

## 2022-11-28 DIAGNOSIS — R4189 Other symptoms and signs involving cognitive functions and awareness: Secondary | ICD-10-CM | POA: Diagnosis present

## 2022-11-28 DIAGNOSIS — D631 Anemia in chronic kidney disease: Secondary | ICD-10-CM | POA: Diagnosis present

## 2022-11-28 DIAGNOSIS — D649 Anemia, unspecified: Secondary | ICD-10-CM | POA: Diagnosis not present

## 2022-11-28 DIAGNOSIS — J449 Chronic obstructive pulmonary disease, unspecified: Secondary | ICD-10-CM | POA: Diagnosis present

## 2022-11-28 DIAGNOSIS — Z8249 Family history of ischemic heart disease and other diseases of the circulatory system: Secondary | ICD-10-CM

## 2022-11-28 DIAGNOSIS — I4819 Other persistent atrial fibrillation: Secondary | ICD-10-CM | POA: Diagnosis not present

## 2022-11-28 DIAGNOSIS — I5033 Acute on chronic diastolic (congestive) heart failure: Secondary | ICD-10-CM | POA: Diagnosis not present

## 2022-11-28 DIAGNOSIS — Z79899 Other long term (current) drug therapy: Secondary | ICD-10-CM | POA: Diagnosis not present

## 2022-11-28 DIAGNOSIS — R0602 Shortness of breath: Secondary | ICD-10-CM | POA: Diagnosis not present

## 2022-11-28 DIAGNOSIS — J9602 Acute respiratory failure with hypercapnia: Secondary | ICD-10-CM | POA: Diagnosis present

## 2022-11-28 DIAGNOSIS — I5032 Chronic diastolic (congestive) heart failure: Secondary | ICD-10-CM | POA: Diagnosis not present

## 2022-11-28 DIAGNOSIS — Z888 Allergy status to other drugs, medicaments and biological substances status: Secondary | ICD-10-CM

## 2022-11-28 DIAGNOSIS — Z880 Allergy status to penicillin: Secondary | ICD-10-CM

## 2022-11-28 DIAGNOSIS — J9690 Respiratory failure, unspecified, unspecified whether with hypoxia or hypercapnia: Secondary | ICD-10-CM | POA: Diagnosis not present

## 2022-11-28 DIAGNOSIS — Z515 Encounter for palliative care: Secondary | ICD-10-CM

## 2022-11-28 DIAGNOSIS — I4891 Unspecified atrial fibrillation: Secondary | ICD-10-CM | POA: Diagnosis not present

## 2022-11-28 DIAGNOSIS — Z8673 Personal history of transient ischemic attack (TIA), and cerebral infarction without residual deficits: Secondary | ICD-10-CM | POA: Diagnosis not present

## 2022-11-28 DIAGNOSIS — J9601 Acute respiratory failure with hypoxia: Secondary | ICD-10-CM | POA: Diagnosis not present

## 2022-11-28 DIAGNOSIS — Z7189 Other specified counseling: Secondary | ICD-10-CM | POA: Diagnosis not present

## 2022-11-28 DIAGNOSIS — I130 Hypertensive heart and chronic kidney disease with heart failure and stage 1 through stage 4 chronic kidney disease, or unspecified chronic kidney disease: Secondary | ICD-10-CM | POA: Diagnosis not present

## 2022-11-28 DIAGNOSIS — Z66 Do not resuscitate: Secondary | ICD-10-CM | POA: Diagnosis present

## 2022-11-28 DIAGNOSIS — R579 Shock, unspecified: Secondary | ICD-10-CM

## 2022-11-28 DIAGNOSIS — Z91048 Other nonmedicinal substance allergy status: Secondary | ICD-10-CM

## 2022-11-28 DIAGNOSIS — E8729 Other acidosis: Secondary | ICD-10-CM | POA: Diagnosis present

## 2022-11-28 DIAGNOSIS — R0902 Hypoxemia: Secondary | ICD-10-CM | POA: Diagnosis not present

## 2022-11-28 DIAGNOSIS — N250 Renal osteodystrophy: Secondary | ICD-10-CM | POA: Diagnosis not present

## 2022-11-28 DIAGNOSIS — G9341 Metabolic encephalopathy: Secondary | ICD-10-CM | POA: Diagnosis not present

## 2022-11-28 DIAGNOSIS — I499 Cardiac arrhythmia, unspecified: Secondary | ICD-10-CM | POA: Diagnosis not present

## 2022-11-28 DIAGNOSIS — J101 Influenza due to other identified influenza virus with other respiratory manifestations: Secondary | ICD-10-CM | POA: Diagnosis present

## 2022-11-28 DIAGNOSIS — Z6837 Body mass index (BMI) 37.0-37.9, adult: Secondary | ICD-10-CM

## 2022-11-28 DIAGNOSIS — Z743 Need for continuous supervision: Secondary | ICD-10-CM | POA: Diagnosis not present

## 2022-11-28 DIAGNOSIS — R570 Cardiogenic shock: Secondary | ICD-10-CM | POA: Diagnosis present

## 2022-11-28 DIAGNOSIS — Z1152 Encounter for screening for COVID-19: Secondary | ICD-10-CM

## 2022-11-28 DIAGNOSIS — R54 Age-related physical debility: Secondary | ICD-10-CM | POA: Diagnosis not present

## 2022-11-28 DIAGNOSIS — N184 Chronic kidney disease, stage 4 (severe): Secondary | ICD-10-CM | POA: Diagnosis present

## 2022-11-28 DIAGNOSIS — I959 Hypotension, unspecified: Secondary | ICD-10-CM | POA: Diagnosis not present

## 2022-11-28 DIAGNOSIS — Z7401 Bed confinement status: Secondary | ICD-10-CM | POA: Diagnosis not present

## 2022-11-28 DIAGNOSIS — Z91040 Latex allergy status: Secondary | ICD-10-CM

## 2022-11-28 DIAGNOSIS — Z886 Allergy status to analgesic agent status: Secondary | ICD-10-CM

## 2022-11-28 DIAGNOSIS — N179 Acute kidney failure, unspecified: Secondary | ICD-10-CM

## 2022-11-28 DIAGNOSIS — N170 Acute kidney failure with tubular necrosis: Secondary | ICD-10-CM | POA: Diagnosis not present

## 2022-11-28 DIAGNOSIS — R062 Wheezing: Secondary | ICD-10-CM | POA: Diagnosis not present

## 2022-11-28 DIAGNOSIS — R531 Weakness: Secondary | ICD-10-CM | POA: Diagnosis not present

## 2022-11-28 DIAGNOSIS — J9622 Acute and chronic respiratory failure with hypercapnia: Secondary | ICD-10-CM | POA: Diagnosis present

## 2022-11-28 DIAGNOSIS — J90 Pleural effusion, not elsewhere classified: Secondary | ICD-10-CM | POA: Diagnosis not present

## 2022-11-28 DIAGNOSIS — D696 Thrombocytopenia, unspecified: Secondary | ICD-10-CM | POA: Diagnosis present

## 2022-11-28 LAB — I-STAT VENOUS BLOOD GAS, ED
Acid-base deficit: 3 mmol/L — ABNORMAL HIGH (ref 0.0–2.0)
Acid-base deficit: 2 mmol/L (ref 0.0–2.0)
Bicarbonate: 25.8 mmol/L (ref 20.0–28.0)
Bicarbonate: 26.6 mmol/L (ref 20.0–28.0)
Calcium, Ion: 1.11 mmol/L — ABNORMAL LOW (ref 1.15–1.40)
Calcium, Ion: 1.1 mmol/L — ABNORMAL LOW (ref 1.15–1.40)
HCT: 31 % — ABNORMAL LOW (ref 36.0–46.0)
HCT: 31 % — ABNORMAL LOW (ref 36.0–46.0)
Hemoglobin: 10.5 g/dL — ABNORMAL LOW (ref 12.0–15.0)
Hemoglobin: 10.5 g/dL — ABNORMAL LOW (ref 12.0–15.0)
O2 Saturation: 87 %
O2 Saturation: 82 %
Potassium: 3.8 mmol/L (ref 3.5–5.1)
Potassium: 4 mmol/L (ref 3.5–5.1)
Sodium: 143 mmol/L (ref 135–145)
Sodium: 140 mmol/L (ref 135–145)
TCO2: 28 mmol/L (ref 22–32)
TCO2: 29 mmol/L (ref 22–32)
pCO2, Ven: 64.9 mmHg — ABNORMAL HIGH (ref 44–60)
pCO2, Ven: 63.8 mmHg — ABNORMAL HIGH (ref 44–60)
pH, Ven: 7.208 — ABNORMAL LOW (ref 7.25–7.43)
pH, Ven: 7.228 — ABNORMAL LOW (ref 7.25–7.43)
pO2, Ven: 65 mmHg — ABNORMAL HIGH (ref 32–45)
pO2, Ven: 56 mmHg — ABNORMAL HIGH (ref 32–45)

## 2022-11-28 LAB — COMPREHENSIVE METABOLIC PANEL
ALT: 74 U/L — ABNORMAL HIGH (ref 0–44)
AST: 71 U/L — ABNORMAL HIGH (ref 15–41)
Albumin: 3 g/dL — ABNORMAL LOW (ref 3.5–5.0)
Alkaline Phosphatase: 151 U/L — ABNORMAL HIGH (ref 38–126)
Anion gap: 12 (ref 5–15)
BUN: 53 mg/dL — ABNORMAL HIGH (ref 8–23)
CO2: 25 mmol/L (ref 22–32)
Calcium: 8.2 mg/dL — ABNORMAL LOW (ref 8.9–10.3)
Chloride: 102 mmol/L (ref 98–111)
Creatinine, Ser: 4.11 mg/dL — ABNORMAL HIGH (ref 0.44–1.00)
GFR, Estimated: 10 mL/min — ABNORMAL LOW (ref >60)
Glucose, Bld: 124 mg/dL — ABNORMAL HIGH (ref 70–99)
Potassium: 4.1 mmol/L (ref 3.5–5.1)
Sodium: 139 mmol/L (ref 135–145)
Total Bilirubin: 0.7 mg/dL (ref 0.3–1.2)
Total Protein: 7.1 g/dL (ref 6.5–8.1)

## 2022-11-28 LAB — CBC
HCT: 33 % — ABNORMAL LOW (ref 36.0–46.0)
Hemoglobin: 9.4 g/dL — ABNORMAL LOW (ref 12.0–15.0)
MCH: 26.7 pg (ref 26.0–34.0)
MCHC: 28.5 g/dL — ABNORMAL LOW (ref 30.0–36.0)
MCV: 93.8 fL (ref 80.0–100.0)
Platelets: 137 10*3/uL — ABNORMAL LOW (ref 150–400)
RBC: 3.52 MIL/uL — ABNORMAL LOW (ref 3.87–5.11)
RDW: 16.2 % — ABNORMAL HIGH (ref 11.5–15.5)
WBC: 7.1 10*3/uL (ref 4.0–10.5)
nRBC: 0 % (ref 0.0–0.2)

## 2022-11-28 LAB — I-STAT CHEM 8, ED
BUN: 49 mg/dL — ABNORMAL HIGH (ref 8–23)
Calcium, Ion: 1.09 mmol/L — ABNORMAL LOW (ref 1.15–1.40)
Chloride: 104 mmol/L (ref 98–111)
Creatinine, Ser: 4.1 mg/dL — ABNORMAL HIGH (ref 0.44–1.00)
Glucose, Bld: 110 mg/dL — ABNORMAL HIGH (ref 70–99)
HCT: 30 % — ABNORMAL LOW (ref 36.0–46.0)
Hemoglobin: 10.2 g/dL — ABNORMAL LOW (ref 12.0–15.0)
Potassium: 3.8 mmol/L (ref 3.5–5.1)
Sodium: 143 mmol/L (ref 135–145)
TCO2: 27 mmol/L (ref 22–32)

## 2022-11-28 LAB — BRAIN NATRIURETIC PEPTIDE: B Natriuretic Peptide: 1220 pg/mL — ABNORMAL HIGH (ref 0.0–100.0)

## 2022-11-28 LAB — TROPONIN I (HIGH SENSITIVITY): Troponin I (High Sensitivity): 101 ng/L (ref <18)

## 2022-11-28 MED ORDER — FENTANYL CITRATE PF 50 MCG/ML IJ SOSY
PREFILLED_SYRINGE | INTRAMUSCULAR | Status: AC
  Administered 2022-11-28: 50 ug
  Filled 2022-11-28: qty 1

## 2022-11-28 MED ORDER — FUROSEMIDE 10 MG/ML IJ SOLN
60.0000 mg | Freq: Once | INTRAMUSCULAR | Status: AC
  Administered 2022-11-28: 60 mg via INTRAVENOUS
  Filled 2022-11-28: qty 6

## 2022-11-28 MED ORDER — NOREPINEPHRINE 4 MG/250ML-% IV SOLN
2.0000 ug/min | INTRAVENOUS | Status: DC
  Administered 2022-11-28 – 2022-11-29 (×2): 6 ug/min via INTRAVENOUS
  Filled 2022-11-28: qty 250

## 2022-11-28 MED ORDER — ALBUTEROL SULFATE (2.5 MG/3ML) 0.083% IN NEBU
10.0000 mg/h | INHALATION_SOLUTION | RESPIRATORY_TRACT | Status: DC
  Administered 2022-11-28: 10 mg/h via RESPIRATORY_TRACT

## 2022-11-28 MED ORDER — METHYLPREDNISOLONE SODIUM SUCC 125 MG IJ SOLR
125.0000 mg | Freq: Once | INTRAMUSCULAR | Status: AC
  Administered 2022-11-28: 125 mg via INTRAVENOUS
  Filled 2022-11-28: qty 2

## 2022-11-28 MED ORDER — METHYLPREDNISOLONE SODIUM SUCC 125 MG IJ SOLR
80.0000 mg | Freq: Every day | INTRAMUSCULAR | Status: DC
  Administered 2022-11-29: 80 mg via INTRAVENOUS
  Filled 2022-11-28: qty 2

## 2022-11-28 MED ORDER — SODIUM CHLORIDE 0.9 % IV SOLN
250.0000 mL | INTRAVENOUS | Status: DC
  Administered 2022-11-29 – 2022-11-30 (×2): 250 mL via INTRAVENOUS

## 2022-11-28 MED ORDER — MAGNESIUM SULFATE 2 GM/50ML IV SOLN
2.0000 g | Freq: Once | INTRAVENOUS | Status: AC
  Administered 2022-11-28: 2 g via INTRAVENOUS
  Filled 2022-11-28: qty 50

## 2022-11-28 MED ORDER — IPRATROPIUM-ALBUTEROL 0.5-2.5 (3) MG/3ML IN SOLN
3.0000 mL | RESPIRATORY_TRACT | Status: DC | PRN
  Filled 2022-11-28: qty 3

## 2022-11-28 MED ORDER — IPRATROPIUM BROMIDE 0.02 % IN SOLN
0.5000 mg | Freq: Once | RESPIRATORY_TRACT | Status: AC
  Administered 2022-11-28: 0.5 mg via RESPIRATORY_TRACT

## 2022-11-28 MED ORDER — NOREPINEPHRINE 4 MG/250ML-% IV SOLN
0.0000 ug/min | INTRAVENOUS | Status: DC
  Administered 2022-11-28: 2 ug/min via INTRAVENOUS
  Filled 2022-11-28: qty 250

## 2022-11-28 NOTE — ED Notes (Signed)
Bp low advised MD Mayra Neer

## 2022-11-28 NOTE — ED Notes (Signed)
Cardioversion with MD

## 2022-11-28 NOTE — ED Provider Notes (Signed)
[facility]ERGENCY DEPARTMENT Provider Note   CSN: [id number] Arrival date & time: 11/28/22  1916     History  Chief Complaint  Patient presents with   Shortness of Breath    Jillian [name] is a 87 y.o. female with stage 4CKD, anemia, chronic diastolic HF, h/o CVA, HTN, obesity, paroxysmal Afib who presents with resp distress.   Pt presents in acute respiratory distress. BIBEMS w/ wheezing and decreased breath sounds throughout all lung fields. Patient put her self on 8L and sating 100%, baseline is 4L. EMS gave 2 duoneb, 1 albuterol neb and placed her on CPAP. EMS also noted Afib w/ RVR. BP 145/90. Patient is drowsy but responsive to voice. She endorses SOB but denies CP. Cannot answer further questions.  HPI     Home Medications Prior to Admission medications   Medication Sig Start Date End Date Taking? Authorizing Provider  apixaban (ELIQUIS) 2.5 MG TABS tablet Take 1 tablet (2.5 mg total) by mouth 2 (two) times daily. 11/11/22   Argentina Donovan, PA-C  carvedilol (COREG) 25 MG tablet Take 1 tablet (25 mg total) by mouth 2 (two) times daily. 11/11/22 02/09/23  Argentina Donovan, PA-C  chlorthalidone (HYGROTON) 25 MG tablet Take 12.5 mg by mouth daily. 08/09/22   [provider]  cholecalciferol (VITAMIN D3) 25 MCG (1000 UNIT) tablet Take by mouth. 10/15/22   [provider]  clotrimazole-betamethasone (LOTRISONE) cream Apply topically 2 (two) times daily. 11/11/22 11/11/23  Argentina Donovan, PA-C  desoximetasone (TOPICORT) 0.25 % cream Apply 1 application topically 2 (two) times daily. 01/05/21   [name], MD  diphenhydrAMINE (BENADRYL) 25 mg capsule Take by mouth. 10/15/22   [provider]  furosemide (LASIX) 20 MG tablet Take 3 tablets (60 mg total) by mouth in the morning. 11/11/22   Argentina Donovan, PA-C  hydrALAZINE (APRESOLINE) 50 MG tablet Take 1 tablet (50 mg total) by mouth 3 (three) times daily. 11/11/22 12/11/22   Argentina Donovan, PA-C  rosuvastatin (CRESTOR) 10 MG tablet Take 1 tablet (10 mg total) by mouth daily. 11/11/22   Argentina Donovan, PA-C  sennosides-docusate sodium (SENOKOT-S) 8.6-50 MG tablet Take 3 tablets by mouth at bedtime. 10/01/21   [name], MD      Allergies    Other, Latex, Penicillins, Tape, Aspirin, Atorvastatin, and Diltiazem hcl    Review of Systems   Review of Systems  Unable to perform ROS: Acuity of condition    Physical Exam Updated Vital Signs BP (!) 88/43   Pulse 61   Temp 98.3 F (36.8 C) (Oral)   Resp 17   SpO2 100%  Physical Exam General: Drowsy female in acute respiratory distress HEENT: PERRLA, Sclera anicteric, MMM, trachea midline.  Cardiology: Irregular tachycardic rhythm, no murmurs/rubs/gallops. BL radial and DP pulses equal bilaterally.  Resp: Significantly increased respiratory rate and effort with poor air movement and inspiratory/expiratory wheezes and crackles bilaterally Abd: Soft, non-tender, non-distended. No rebound tenderness or guarding.  GU: Deferred. MSK: 2+ symmetric nonpitting peripheral edema. No signs of trauma. Extremities without deformity or TTP. No cyanosis or clubbing. Skin: warm, dry. No rashes or lesions. Neuro: Responsive to voice, GCS 11-12, CNs II-XII grossly intact. MAEs. Sensation grossly intact.  Psych: [place]  ED Results / Procedures / Treatments   Labs (all labs ordered are listed, but only abnormal results are displayed) Labs Reviewed  COMPREHENSIVE METABOLIC PANEL - Abnormal; Notable for the following components:      Result  Value   Glucose, Bld 124 (*)    BUN 53 (*)    Creatinine, Ser 4.11 (*)    Calcium 8.2 (*)    Albumin 3.0 (*)    AST 71 (*)    ALT 74 (*)    Alkaline Phosphatase 151 (*)    GFR, Estimated 10 (*)    All other components within normal limits  CBC - Abnormal; Notable for the following components:   RBC 3.52 (*)    Hemoglobin 9.4 (*)    HCT 33.0 (*)    MCHC 28.5 (*)    RDW  16.2 (*)    Platelets 137 (*)    All other components within normal limits  BRAIN NATRIURETIC PEPTIDE - Abnormal; Notable for the following components:   B Natriuretic Peptide 1,220.0 (*)    All other components within normal limits  I-STAT VENOUS BLOOD GAS, ED - Abnormal; Notable for the following components:   pH, Ven 7.208 (*)    pCO2, Ven 64.9 (*)    pO2, Ven 65 (*)    Acid-base deficit 3.0 (*)    Calcium, Ion 1.11 (*)    HCT 31.0 (*)    Hemoglobin 10.5 (*)    All other components within normal limits  I-STAT CHEM 8, ED - Abnormal; Notable for the following components:   BUN 49 (*)    Creatinine, Ser 4.10 (*)    Glucose, Bld 110 (*)    Calcium, Ion 1.09 (*)    Hemoglobin 10.2 (*)    HCT 30.0 (*)    All other components within normal limits  I-STAT VENOUS BLOOD GAS, ED - Abnormal; Notable for the following components:   pH, Ven 7.228 (*)    pCO2, Ven 63.8 (*)    pO2, Ven 56 (*)    Calcium, Ion 1.10 (*)    HCT 31.0 (*)    Hemoglobin 10.5 (*)    All other components within normal limits  TROPONIN I (HIGH SENSITIVITY) - Abnormal; Notable for the following components:   Troponin I (High Sensitivity) 101 (*)    All other components within normal limits  RESP PANEL BY RT-PCR (RSV, FLU A&B, COVID)  RVPGX2  URINALYSIS, ROUTINE W REFLEX MICROSCOPIC  RAPID URINE DRUG SCREEN, HOSP PERFORMED  COMPREHENSIVE METABOLIC PANEL  MAGNESIUM  PROTIME-INR  APTT  CBC  AMMONIA  TSH  ETHANOL  TROPONIN I (HIGH SENSITIVITY)    EKG EKG Interpretation  Date/Time:  Sunday November 28 2022 20:05:48 EST Ventricular Rate:  56 PR Interval:    QRS Duration: 95 QT Interval:  369 QTC Calculation: 356 R Axis:   55 Text Interpretation: Atrial fibrillation LVH with secondary repolarization abnormality Confirmed by Ripley Fraise [phone]) on 11/28/2022 11:22:46 PM  Radiology DG Chest Port 1 View  Result Date: 11/28/2022 CLINICAL DATA:  SOB EXAM: PORTABLE CHEST - 1 VIEW COMPARISON:  03/08/2022  FINDINGS: Cardiac silhouette is prominent. There is pulmonary interstitial prominence with vascular congestion. No focal consolidation. No pneumothorax or pleural effusion identified. Aorta is ectatic and calcified. IMPRESSION: Findings suggest CHF. Electronically Signed   By: Sammie Bench M.D.   On: 11/28/2022 20:18    Procedures .Critical Care  Performed by: Audley Hose, MD Authorized by: Audley Hose, MD   Critical care provider statement:    Critical care time (minutes):  90   Critical care was necessary to treat or prevent imminent or life-threatening deterioration of the following conditions:  Cardiac failure and respiratory failure   Critical  care was time spent personally by me on the following activities:  Development of treatment plan with patient or surrogate, discussions with consultants, evaluation of patient's response to treatment, examination of patient, ordering and review of laboratory studies, ordering and review of radiographic studies, ordering and performing treatments and interventions, pulse oximetry, re-evaluation of patient's condition, review of old charts, obtaining history from patient or surrogate and ventilator management .Cardioversion  Date/Time: 11/28/2022 8:06 PM  Performed by: Audley Hose, MD Authorized by: Audley Hose, MD   Consent:    Consent obtained:  Emergent situation Pre-procedure details:    Cardioversion basis:  Emergent   Rhythm:  Atrial fibrillation   Electrode placement:  Anterior-posterior Patient sedated: No Attempt one:    Cardioversion mode:  Synchronous   Waveform:  Biphasic   Shock (Joules):  200   Shock outcome:  Conversion to normal sinus rhythm     Medications Ordered in ED Medications  furosemide (LASIX) injection 60 mg (has no administration in time range)  0.9 %  sodium chloride infusion (has no administration in time range)  norepinephrine (LEVOPHED) 4mg  in 224mL (0.016 mg/mL) premix infusion (has no  administration in time range)  ipratropium-albuterol (DUONEB) 0.5-2.5 (3) MG/3ML nebulizer solution 3 mL (has no administration in time range)  magnesium sulfate IVPB 2 g 50 mL (has no administration in time range)  methylPREDNISolone sodium succinate (SOLU-MEDROL) 125 mg/2 mL injection 80 mg (has no administration in time range)  ipratropium (ATROVENT) nebulizer solution 0.5 mg (0.5 mg Nebulization Given 11/28/22 1931)  fentaNYL (SUBLIMAZE) 50 MCG/ML injection (50 mcg  Given 11/28/22 1958)  methylPREDNISolone sodium succinate (SOLU-MEDROL) 125 mg/2 mL injection 125 mg (125 mg Intravenous Given 11/28/22 2028)  furosemide (LASIX) injection 60 mg (60 mg Intravenous Given 11/28/22 2028)    ED Course/ Medical Decision Making/ A&P                          Medical Decision Making Amount and/or Complexity of Data Reviewed Labs: ordered. Decision-making details documented in ED Course. Radiology: ordered. Decision-making details documented in ED Course.  Risk Prescription drug management. Decision regarding hospitalization.    This patient presents to the ED for concern of shortness of breath, respiratory distress, A-fib with RVR, depressed mental status; this involves an extensive number of treatment options, and is a complaint that carries with it a high risk of complications and morbidity.  I considered the following differential and admission for this acute, potentially life threatening condition.   MDM:    Patient presents with shortness of breath dyspnea and wheezing.  History was limited by acuity of situation.  Patient found to be in A-fib with RVR with rates in the 140s to 170s, mental status GCS 12-13, moving very poor air with significant wheezing on exam.  I cannot find a history in the chart of any obstructive lung disease such as COPD but RT noted that EMS stated patient takes "breathing treatments "at home.  Patient was on CPAP with EMS but it was discontinued after treatments on  arrival by RT.  On my evaluation, patient has very limited air movement and tachypnea though she is satting at 100% on the nonrebreather with the albuterol treatments.  I called RT to replace the BiPAP for ventilation.  I am concerned for acute pulmonary edema as a result of A-fib with RVR and will perform emergent cardioversion.  Patient is given 12.5 mcg IV fentanyl and cardioversion is performed, with  resultant normal sinus rhythm in the 60s with occasional ectopy.  Patient's mental status minimally improves and she is continued on the BiPAP.  I will treat for possible obstructive lung disease like COPD exacerbation with Solu-Medrol and further DuoNeb treatments and for acute pulmonary edema with Lasix.  Consider also ACS and will obtain trops, pneumonia, pulmonary edema, pleural effusion.  For her mental status consider hypoxia/hypercarbia, electrolyte abnormalities, renal injury, arrhythmia/ACS, hypo or hyperglycemia, sepsis with possible pulmonary source, acidosis.  After cardioversion and conversion to NSR, patient became hypotensive into 44R systolic. Due to c/f volume overload will not give fluids and will start norepinephrine. Had not gotten the lasix yet. Consider cardiogenic shock at top of differential though odd that patient's blood pressure worsened after the cardioversion and conversion to normal sinus rhythm.  Considered obstructive shock but bedside ultrasound does not demonstrate any sign of cardiac tamponade or right heart strain from PE. Patient does not have any hives to suggest anaphylaxis/distributive shock, though wheezing and hypotension/tachycardia/AMS fit. Patient's total body is evaluated and no hives noted. I do not want to trial epinephrine for arrhythmogenic effects.  Discussed with daughter Bertha Stakes who confirms that patient is full code.  Patient is started on nor epi drip and blood pressures greater than 65 MAP after 6 mcg per minute.  Patient is given treatments including  Solu-Medrol, DuoNebs, Lasix 60 mg IV.  VBG demonstrates mild hypercarbia at 64.9 and mild acidosis 7.2 we will continue patient on BiPAP and rate is increased to 12 breaths/min.  Patient is only pulling 200 to 300 cc/breath.  proBNP elevated 1200.  AKI with creatinine to 4 up from the 2s. Will consult with intensivist and recheck VBG after some time BiPAP.   Clinical Course as of 11/28/22 2329  Sun Nov 28, 2022  1947 HR in 120s-140s Afib w/ RVR. [HN]  2022 DG Chest Port 1 View FINDINGS: Cardiac silhouette is prominent. There is pulmonary interstitial prominence with vascular congestion. No focal consolidation. No pneumothorax or pleural effusion identified. Aorta is ectatic and calcified.  IMPRESSION: Findings suggest CHF.   [HN]  2022 pCO2, Ven(!): 64.9 [HN]  2023 pH, Ven(!): 7.208 [HN]  2031 D/w daughter Bertha Stakes who states that patient was disoriented earlier today. Then she wasn't breathing right, gasping for air. Confirmed full code and okay to intubate, daughter Bertha Stakes gives consent. [HN]  2035 Consulted to critical care. [HN]  2038 Pt pressure in 60s/40s, starting norepi drip [HN]  2103 Creatinine(!): 4.10 AKI up from 2s [HN]  2103 Hemoglobin(!): 10.2 At BL [HN]  2103 WBC: 7.1 No leukocytosis [HN]  2103 Pts SBP >90 mmHg on 6 mcg/min of norepi [HN]  2211 B Natriuretic Peptide(!): 1,220.0 [HN]  2226 I-Stat venous blood gas, ([facility], [facility], [facility])(!) Unchanged [HN]  2255 Admitted to ICU [HN]    Clinical Course User Index [HN] Audley Hose, MD    Labs: I Ordered, and personally interpreted labs.  The pertinent results include: Those listed above  Imaging Studies ordered: I ordered imaging studies including CXR I independently visualized and interpreted imaging. I agree with the radiologist interpretation  Cardiac Monitoring: The patient was maintained on a cardiac monitor.  I personally viewed and interpreted the cardiac monitored which showed an underlying rhythm of:  Initially A-fib with RVR and subsequently normal sinus rhythm with intermittent ectopy after cardioversion  Reevaluation: After the interventions noted above, I reevaluated the patient and found that they have : Improved  Social Determinants of Health:  patient lives  independently  Disposition: Admit to ICU  Co morbidities that complicate the patient evaluation  Past Medical History:  Diagnosis Date   Acute CVA (cerebrovascular accident) ([place]) 07/12/2020   Hypertension    Renal disorder    CKD Stage IV   Stroke ([place])      Medicines Meds ordered this encounter  Medications   DISCONTD: albuterol (PROVENTIL) (2.5 MG/3ML) 0.083% nebulizer solution   ipratropium (ATROVENT) nebulizer solution 0.5 mg   fentaNYL (SUBLIMAZE) 50 MCG/ML injection    Ezell, Whitney R: cabinet override   methylPREDNISolone sodium succinate (SOLU-MEDROL) 125 mg/2 mL injection 125 mg    IV methylprednisolone will be converted to either a q12h or q24h frequency with the same total daily dose (TDD).  Ordered Dose: 1 to 125 mg TDD; convert to: TDD q24h.  Ordered Dose: 126 to 250 mg TDD; convert to: TDD div q12h.  Ordered Dose: >250 mg TDD; DAW.   furosemide (LASIX) injection 60 mg   DISCONTD: norepinephrine (LEVOPHED) 4mg  in 237mL (0.016 mg/mL) premix infusion   furosemide (LASIX) injection 60 mg   0.9 %  sodium chloride infusion   norepinephrine (LEVOPHED) 4mg  in 237mL (0.016 mg/mL) premix infusion    Order Specific Question:   IV Access    Answer:   Peripheral   ipratropium-albuterol (DUONEB) 0.5-2.5 (3) MG/3ML nebulizer solution 3 mL   magnesium sulfate IVPB 2 g 50 mL   methylPREDNISolone sodium succinate (SOLU-MEDROL) 125 mg/2 mL injection 80 mg    I have reviewed the patients home medicines and have made adjustments as needed  Problem List / ED Course: Problem List Items Addressed This Visit   None Visit Diagnoses     Acute hypoxic respiratory failure (HCC)    -  Primary   Shock ([place])        Atrial fibrillation with rapid ventricular response (HCC)       Relevant Medications   furosemide (LASIX) injection 60 mg (Completed)   furosemide (LASIX) injection 60 mg   norepinephrine (LEVOPHED) 4mg  in 263mL (0.016 mg/mL) premix infusion   AKI (acute kidney injury) ([place])                       This note was created using dictation software, which may contain spelling or grammatical errors.    Audley Hose, MD 11/28/22 [phone]

## 2022-11-28 NOTE — ED Notes (Signed)
ED TO INPATIENT HANDOFF REPORT  ED Nurse Name and Phone #: [phone]    S Name/Age/Gender Jillian [name] 87 y.o. female Room/Bed: 026C/026C  Code Status   Code Status: Full Code  Home/SNF/Other Home Patient oriented to: self, place, and situation Is this baseline?  Unknown   Triage Complete: Triage complete  Chief Complaint Acute respiratory failure with hypercapnia ([place]) [J96.02]  Triage Note Pt bib gcems pt has sob ems stated wheezing all over and decreased breath sounds in lower. Pt put her self on 8L and sating 100%, baseline is 4L. Ems gave 2 duoneb, 1 albuterol neb and placed her on cpap. Pt had uncontrolled afib to controlled with ems. Hr initially 180.  Bp- 145/90 Hr- 120 100%    Allergies Allergies  Allergen Reactions   Other Other (See Comments)    "Tapes tears off my skin"   Latex Other (See Comments)    "Blisters my skin and tears it off"- skin burns, too   Penicillins Nausea Only   Tape Other (See Comments)    "Tapes tears off my skin"   Aspirin Other (See Comments)    Irritates the stomach   Atorvastatin Other (See Comments)    Caused fatigue   Diltiazem Hcl Nausea And Vomiting    Level of Care/Admitting Diagnosis ED Disposition     ED Disposition  Admit   Condition  --   [place]: [facility] [100100]  Level of Care: ICU [6]  May admit patient to [facility] or [facility] if equivalent level of care is available:: Yes  Covid Evaluation: Symptomatic Person Under Investigation (PUI) or recent exposure (last 10 days) *Testing Required*  Diagnosis: Acute respiratory failure with hypercapnia [facility]) [488891]  Admitting Physician: Lanier Clam [phone]  Attending Physician: Lanier Clam [[id number]]  Certification:: I certify this patient will need inpatient services for at least 2 midnights  Estimated Length of Stay: 2          B Medical/Surgery History Past Medical History:  Diagnosis Date    Acute CVA (cerebrovascular accident) ([place]) 07/12/2020   Hypertension    Renal disorder    CKD Stage IV   Stroke [facility])    Past Surgical History:  Procedure Laterality Date   CARDIAC CATHETERIZATION  2009     A IV Location/Drains/Wounds Patient Lines/Drains/Airways Status     Active Line/Drains/Airways     Name Placement date Placement time Site Days   Peripheral IV 11/28/22 20 G Anterior;Proximal;Right Forearm 11/28/22  1945  Forearm  less than 1   Peripheral IV 11/28/22 22 G 1" Left;Anterior;Distal Forearm 11/28/22  2119  Forearm  less than 1   External Urinary Catheter 11/28/22  2025  --  less than 1            Intake/Output Last 24 hours No intake or output data in the 24 hours ending 11/28/22 2330  Labs/Imaging Results for orders placed or performed during the hospital encounter of 11/28/22 (from the past 48 hour(s))  Comprehensive metabolic panel     Status: Abnormal   Collection Time: 11/28/22  7:28 PM  Result Value Ref Range   Sodium 139 135 - 145 mmol/L   Potassium 4.1 3.5 - 5.1 mmol/L   Chloride 102 98 - 111 mmol/L   CO2 25 22 - 32 mmol/L   Glucose, Bld 124 (H) 70 - 99 mg/dL    Comment: Glucose reference range applies only to samples taken after fasting for at  least 8 hours.   BUN 53 (H) 8 - 23 mg/dL   Creatinine, Ser 4.11 (H) 0.44 - 1.00 mg/dL   Calcium 8.2 (L) 8.9 - 10.3 mg/dL   Total Protein 7.1 6.5 - 8.1 g/dL   Albumin 3.0 (L) 3.5 - 5.0 g/dL   AST 71 (H) 15 - 41 U/L   ALT 74 (H) 0 - 44 U/L   Alkaline Phosphatase 151 (H) 38 - 126 U/L   Total Bilirubin 0.7 0.3 - 1.2 mg/dL   GFR, Estimated 10 (L) >60 mL/min    Comment: (NOTE) Calculated using the CKD-EPI Creatinine Equation (2021)    Anion gap 12 5 - 15    Comment: Performed at [facility], [place] [address] [place], [place] [address]  CBC     Status: Abnormal   Collection Time: 11/28/22  7:28 PM  Result Value Ref Range   WBC 7.1 4.0 - 10.5 K/uL   RBC 3.52 (L) 3.87 - 5.11 MIL/uL    Hemoglobin 9.4 (L) 12.0 - 15.0 g/dL   HCT 33.0 (L) 36.0 - 46.0 %   MCV 93.8 80.0 - 100.0 fL   MCH 26.7 26.0 - 34.0 pg   MCHC 28.5 (L) 30.0 - 36.0 g/dL   RDW 16.2 (H) 11.5 - 15.5 %   Platelets 137 (L) 150 - 400 K/uL   nRBC 0.0 0.0 - 0.2 %    Comment: Performed at [facility] [address] [place], [place] [address]  Brain natriuretic peptide     Status: Abnormal   Collection Time: 11/28/22  7:28 PM  Result Value Ref Range   B Natriuretic Peptide 1,220.0 (H) 0.0 - 100.0 pg/mL    Comment: Performed at [facility] [address], [place], [place] [address]  Troponin I (High Sensitivity)     Status: Abnormal   Collection Time: 11/28/22  7:28 PM  Result Value Ref Range   Troponin I (High Sensitivity) 101 (HH) <18 ng/L    Comment: CRITICAL RESULT CALLED TO, READ BACK BY AND VERIFIED WITH W.EZZELL,RN. 2141 11/28/22. LPAIT (NOTE) Elevated high sensitivity troponin I (hsTnI) values and significant  changes across serial measurements may suggest ACS but many other  chronic and acute conditions are known to elevate hsTnI results.  Refer to the "Links" section for chest pain algorithms and additional  guidance. Performed at [facility], [place] [address] [place], [place] [address]   I-Stat venous blood gas, ED     Status: Abnormal   Collection Time: 11/28/22  8:07 PM  Result Value Ref Range   pH, Ven 7.208 (L) 7.25 - 7.43   pCO2, Ven 64.9 (H) 44 - 60 mmHg   pO2, Ven 65 (H) 32 - 45 mmHg   Bicarbonate 25.8 20.0 - 28.0 mmol/L   TCO2 28 22 - 32 mmol/L   O2 Saturation 87 %   Acid-base deficit 3.0 (H) 0.0 - 2.0 mmol/L   Sodium 143 135 - 145 mmol/L   Potassium 3.8 3.5 - 5.1 mmol/L   Calcium, Ion 1.11 (L) 1.15 - 1.40 mmol/L   HCT 31.0 (L) 36.0 - 46.0 %   Hemoglobin 10.5 (L) 12.0 - 15.0 g/dL   Sample type VENOUS   I-Stat Chem 8, ED     Status: Abnormal   Collection Time: 11/28/22  8:07 PM  Result Value Ref Range   Sodium 143 135 - 145 mmol/L   Potassium 3.8 3.5 - 5.1  mmol/L   Chloride  104 98 - 111 mmol/L   BUN 49 (H) 8 - 23 mg/dL   Creatinine, Ser 4.10 (H) 0.44 - 1.00 mg/dL   Glucose, Bld 110 (H) 70 - 99 mg/dL    Comment: Glucose reference range applies only to samples taken after fasting for at least 8 hours.   Calcium, Ion 1.09 (L) 1.15 - 1.40 mmol/L   TCO2 27 22 - 32 mmol/L   Hemoglobin 10.2 (L) 12.0 - 15.0 g/dL   HCT 30.0 (L) 36.0 - 46.0 %  I-Stat venous blood gas, ([facility], [facility], [facility])     Status: Abnormal   Collection Time: 11/28/22  9:49 PM  Result Value Ref Range   pH, Ven 7.228 (L) 7.25 - 7.43   pCO2, Ven 63.8 (H) 44 - 60 mmHg   pO2, Ven 56 (H) 32 - 45 mmHg   Bicarbonate 26.6 20.0 - 28.0 mmol/L   TCO2 29 22 - 32 mmol/L   O2 Saturation 82 %   Acid-base deficit 2.0 0.0 - 2.0 mmol/L   Sodium 140 135 - 145 mmol/L   Potassium 4.0 3.5 - 5.1 mmol/L   Calcium, Ion 1.10 (L) 1.15 - 1.40 mmol/L   HCT 31.0 (L) 36.0 - 46.0 %   Hemoglobin 10.5 (L) 12.0 - 15.0 g/dL   Sample type VENOUS    DG Chest Port 1 View  Result Date: 11/28/2022 CLINICAL DATA:  SOB EXAM: PORTABLE CHEST - 1 VIEW COMPARISON:  03/08/2022 FINDINGS: Cardiac silhouette is prominent. There is pulmonary interstitial prominence with vascular congestion. No focal consolidation. No pneumothorax or pleural effusion identified. Aorta is ectatic and calcified. IMPRESSION: Findings suggest CHF. Electronically Signed   By: Sammie Bench M.D.   On: 11/28/2022 20:18    Pending Labs Unresulted Labs (From admission, onward)     Start     Ordered   11/29/22 0500  Comprehensive metabolic panel  Tomorrow morning,   R        11/28/22 2229   11/29/22 0500  CBC  Tomorrow morning,   R        11/28/22 2229   11/28/22 2235  Ammonia  Once,   R        11/28/22 2234   11/28/22 2235  TSH  Once,   R        11/28/22 2234   11/28/22 2235  Ethanol  Once,   R        11/28/22 2234   11/28/22 2228  Protime-INR  Once,   R        11/28/22 2229   11/28/22 2228  APTT  Once,   R        11/28/22 2229   11/28/22  2227  Magnesium  Once,   R        11/28/22 2229   11/28/22 2220  Urinalysis, Routine w reflex microscopic  Once,   URGENT        11/28/22 2220   11/28/22 2220  Rapid urine drug screen (hospital performed)  ONCE - STAT,   STAT        11/28/22 2220   11/28/22 1946  Resp panel by RT-PCR (RSV, Flu A&B, Covid) Anterior Nasal Swab  (Asymptomatic - Covid)  Once,   URGENT        11/28/22 1946            Vitals/Pain Today's Vitals   11/28/22 2015 11/28/22 2030 11/28/22 2040 11/28/22 2245  BP: (!) 81/53 (!) 94/56 (!) 88/43 118/62  Pulse: (!) 58 62 61 (!) 49  Resp: 14 18 17 11   Temp:      TempSrc:      SpO2: 100% 100% 100% 100%    Isolation Precautions No active isolations  Medications Medications  furosemide (LASIX) injection 60 mg (has no administration in time range)  0.9 %  sodium chloride infusion (has no administration in time range)  norepinephrine (LEVOPHED) 4mg  in 238mL (0.016 mg/mL) premix infusion (has no administration in time range)  ipratropium-albuterol (DUONEB) 0.5-2.5 (3) MG/3ML nebulizer solution 3 mL (has no administration in time range)  magnesium sulfate IVPB 2 g 50 mL (has no administration in time range)  methylPREDNISolone sodium succinate (SOLU-MEDROL) 125 mg/2 mL injection 80 mg (has no administration in time range)  ipratropium (ATROVENT) nebulizer solution 0.5 mg (0.5 mg Nebulization Given 11/28/22 1931)  fentaNYL (SUBLIMAZE) 50 MCG/ML injection (50 mcg  Given 11/28/22 1958)  methylPREDNISolone sodium succinate (SOLU-MEDROL) 125 mg/2 mL injection 125 mg (125 mg Intravenous Given 11/28/22 2028)  furosemide (LASIX) injection 60 mg (60 mg Intravenous Given 11/28/22 2028)    Mobility walks     Focused Assessments Cardiac Assessment Handoff:  Cardiac Rhythm: Atrial fibrillation No results found for: "CKTOTAL", "CKMB", "CKMBINDEX", "TROPONINI" No results found for: "DDIMER" Does the Patient currently have chest pain? No    R Recommendations: See  Admitting Provider Note  Report given to:   Additional Notes:

## 2022-11-28 NOTE — ED Triage Notes (Signed)
Pt bib gcems pt has sob ems stated wheezing all over and decreased breath sounds in lower. Pt put her self on 8L and sating 100%, baseline is 4L. Ems gave 2 duoneb, 1 albuterol neb and placed her on cpap. Pt had uncontrolled afib to controlled with ems. Hr initially 180.  Bp- 145/90 Hr- 120 100%

## 2022-11-28 NOTE — Progress Notes (Signed)
RT stand-by for cardio conversion. Patients vital signs stable after cardioversion currently. RT placed patient on BIPAP per MD request.

## 2022-11-28 NOTE — H&P (Signed)
NAME:  Jillian [name], MRN:  [id number], DOB:  02-Aug-1936, LOS: 0 ADMISSION DATE:  11/28/2022, CONSULTATION DATE:  1/14 REFERRING MD:  Dr. Mayra Neer, CHIEF COMPLAINT:  afib rvr; shock   History of Present Illness:  Patient is a 87 yo F w/ pertinent PMH CKD 4, Chronic diastolic HF, CVA, HTN, paroxysmal Afib on eliquis presents to [facility] ED on 1/14 w/ resp distress.  Upon arrival to [facility], patient in resp distress w/ RR 24. Afebrile. Placed on BiPAP. Given breathing treatments, lasix, and started on steroids. CXR suggestive of CHF. VBG 7.2, 64, 65, 25. Patient altered w/ GCS 12-13. Afib rvr w/ rate 170s. BP 121/79. Patient cardioverted and rate in 50-60s and became hypotensive 88/43. Started on levo 2 mcg. PCCM consulted.  Pertinent labs: Glucose 124, K 4.1, bun, 53, creat 4.11 (baseline 2.2), ast 71, alt 74, alk phosph 151, hgb 9.4, platelets 137, trop 101, wbc 7.1, hgb 9.4, platelets 137.  Pertinent  Medical History   Past Medical History:  Diagnosis Date   Acute CVA (cerebrovascular accident) ([place]) 07/12/2020   Hypertension    Renal disorder    CKD Stage IV   Stroke ([place])      Significant Hospital Events: Including procedures, antibiotic start and stop dates in addition to other pertinent events   1/14 admitted to [facility] resp distress on bipap; afib rvr cardioverted then hypotensive and bradycardic placed on levo  Interim History / Subjective:  See above  Objective   Blood pressure (!) 88/43, pulse 61, temperature 98.3 F (36.8 C), temperature source Oral, resp. rate (!) 0, SpO2 100 %.    FiO2 (%):  [40 %] 40 %  No intake or output data in the 24 hours ending 11/28/22 2050 There were no vitals filed for this visit.  Examination: General:  ill appearing female w/ ams HEENT: MM pink/moist; ETT in place Neuro: lethargic but able to follow commands; b/l muscle strength UE and LE CV: s1s2, irregular irregular 60s, no m/r/g PULM:  dim BS bilaterally; on bipap GI: soft, bsx4 active   Extremities: warm/dry, BLE 2+ edema  Skin: no rashes or lesions    Resolved Hospital Problem list     Assessment & Plan:   Acute respiratory failure w/ hypercarbia -Likely CHF exacerbation > COPD Plan: -cont bipap -wean fio2 for sats >92% -IV lasix -prn duoneb and cont steroids for wheezing -give mag  Afib RVR s/p cardioversion Bradycardic: post cardioversion Shock: likely cardiogenic? Hx of afib on eliquis Elevated Trop: likely demand ischemia Plan: -wean levo for map >65 -telemetry monitoring -cont iv lasix; monitor UOP -echo -heparin gtt -trend trop; bnp pending  Acute encephalopathy -likely hypercarbia related Plan: -cont bipap for normocapnia -check ammonia, tsh, uds, ethanol -limit sedating meds  Chronic diastolic HF: echo 62/6948 EF 55-60% HTN Plan: -check echo and f/u bnp -IV lasix -hold anti-hypertensive's given hypotension -daily weights; strict I/o's  AKI on CKD 4: baseline creat 2.2 Plan: -IV lasix -place foley -Trend BMP / urinary output -Replace electrolytes as indicated -Avoid nephrotoxic agents, ensure adequate renal perfusion  Mildly elevated LFTs: likely congestion Plan: -trend cmp  Anemia Thrombocytopenia Plan: -trend cbc  Hx of CVA: recent L MCA stroke 09/2022 Plan: -resume heparin for afib -hold statin while npo and elevated LFTs  Best Practice (right click and "Reselect all SmartList Selections" daily)   Diet/type: NPO DVT prophylaxis: systemic heparin GI prophylaxis: N/A Lines: N/A Foley:  Yes, and it is still needed Code Status:  full code Last date of  multidisciplinary goals of care discussion [1/14 updated daughter [name] over phone; wants patient to remain full code.]  Labs   CBC: Recent Labs  Lab 11/28/22 2007  HGB 10.5*  10.2*  HCT 31.0*  30.0*    Basic Metabolic Panel: Recent Labs  Lab 11/28/22 2007  NA 143  143  K 3.8  3.8  CL 104  GLUCOSE 110*  BUN 49*  CREATININE 4.10*    GFR: CrCl cannot be calculated (Unknown ideal weight.). No results for input(s): "PROCALCITON", "WBC", "LATICACIDVEN" in the last 168 hours.  Liver Function Tests: No results for input(s): "AST", "ALT", "ALKPHOS", "BILITOT", "PROT", "ALBUMIN" in the last 168 hours. No results for input(s): "LIPASE", "AMYLASE" in the last 168 hours. No results for input(s): "AMMONIA" in the last 168 hours.  ABG    Component Value Date/Time   HCO3 25.8 11/28/2022 2007   TCO2 28 11/28/2022 2007   TCO2 27 11/28/2022 2007   ACIDBASEDEF 3.0 (H) 11/28/2022 2007   O2SAT 87 11/28/2022 2007     Coagulation Profile: No results for input(s): "INR", "PROTIME" in the last 168 hours.  Cardiac Enzymes: No results for input(s): "CKTOTAL", "CKMB", "CKMBINDEX", "TROPONINI" in the last 168 hours.  HbA1C: Hgb A1c MFr Bld  Date/Time Value Ref Range Status  07/08/2021 02:38 AM 6.2 (H) 4.8 - 5.6 % Final    Comment:    (NOTE) Pre diabetes:          5.7%-6.4%  Diabetes:              >6.4%  Glycemic control for   <7.0% adults with diabetes   07/13/2020 04:59 AM 6.3 (H) 4.8 - 5.6 % Final    Comment:    (NOTE) Pre diabetes:          5.7%-6.4%  Diabetes:              >6.4%  Glycemic control for   <7.0% adults with diabetes     CBG: No results for input(s): "GLUCAP" in the last 168 hours.  Review of Systems:   Patient is encephalopathic. Therefore history has been obtained from chart review.    Past Medical History:  She,  has a past medical history of Acute CVA (cerebrovascular accident) ([place]) (07/12/2020), Hypertension, Renal disorder, and Stroke ([place]).   Surgical History:   Past Surgical History:  Procedure Laterality Date   CARDIAC CATHETERIZATION  2009     Social History:   reports that she has never smoked. She has never used smokeless tobacco.   Family History:  Her family history includes Breast cancer in her mother; CAD in her mother.   Allergies Allergies  Allergen Reactions    Other Other (See Comments)    "Tapes tears off my skin"   Latex Other (See Comments)    "Blisters my skin and tears it off"- skin burns, too   Penicillins Nausea Only   Tape Other (See Comments)    "Tapes tears off my skin"   Aspirin Other (See Comments)    Irritates the stomach   Atorvastatin Other (See Comments)    Caused fatigue   Diltiazem Hcl Nausea And Vomiting     Home Medications  Prior to Admission medications   Medication Sig Start Date End Date Taking? Authorizing Provider  apixaban (ELIQUIS) 2.5 MG TABS tablet Take 1 tablet (2.5 mg total) by mouth 2 (two) times daily. 11/11/22   Argentina Donovan, PA-C  carvedilol (COREG) 25 MG tablet Take 1 tablet (25 mg total)  by mouth 2 (two) times daily. 11/11/22 02/09/23  Argentina Donovan, PA-C  chlorthalidone (HYGROTON) 25 MG tablet Take 12.5 mg by mouth daily. 08/09/22   [provider]  cholecalciferol (VITAMIN D3) 25 MCG (1000 UNIT) tablet Take by mouth. 10/15/22   [provider]  clotrimazole-betamethasone (LOTRISONE) cream Apply topically 2 (two) times daily. 11/11/22 11/11/23  Argentina Donovan, PA-C  desoximetasone (TOPICORT) 0.25 % cream Apply 1 application topically 2 (two) times daily. 01/05/21   [name], MD  diphenhydrAMINE (BENADRYL) 25 mg capsule Take by mouth. 10/15/22   [provider]  furosemide (LASIX) 20 MG tablet Take 3 tablets (60 mg total) by mouth in the morning. 11/11/22   Argentina Donovan, PA-C  hydrALAZINE (APRESOLINE) 50 MG tablet Take 1 tablet (50 mg total) by mouth 3 (three) times daily. 11/11/22 12/11/22  Argentina Donovan, PA-C  rosuvastatin (CRESTOR) 10 MG tablet Take 1 tablet (10 mg total) by mouth daily. 11/11/22   Argentina Donovan, PA-C  sennosides-docusate sodium (SENOKOT-S) 8.6-50 MG tablet Take 3 tablets by mouth at bedtime. 10/01/21   [name], MD     Critical care time: 45 minutes    JD Geryl [facility] 11/28/2022,  8:50 PM  Please see [email or web address] for pager details.  From 7A-7P if no response, please call [phone]. After hours, please call ELink [phone].

## 2022-11-29 ENCOUNTER — Inpatient Hospital Stay (HOSPITAL_COMMUNITY): Payer: Medicare Other

## 2022-11-29 DIAGNOSIS — I5032 Chronic diastolic (congestive) heart failure: Secondary | ICD-10-CM

## 2022-11-29 DIAGNOSIS — J9602 Acute respiratory failure with hypercapnia: Secondary | ICD-10-CM | POA: Diagnosis not present

## 2022-11-29 LAB — COMPREHENSIVE METABOLIC PANEL
ALT: 70 U/L — ABNORMAL HIGH (ref 0–44)
AST: 64 U/L — ABNORMAL HIGH (ref 15–41)
Albumin: 2.7 g/dL — ABNORMAL LOW (ref 3.5–5.0)
Alkaline Phosphatase: 136 U/L — ABNORMAL HIGH (ref 38–126)
Anion gap: 13 (ref 5–15)
BUN: 54 mg/dL — ABNORMAL HIGH (ref 8–23)
CO2: 23 mmol/L (ref 22–32)
Calcium: 8.1 mg/dL — ABNORMAL LOW (ref 8.9–10.3)
Chloride: 104 mmol/L (ref 98–111)
Creatinine, Ser: 4.04 mg/dL — ABNORMAL HIGH (ref 0.44–1.00)
GFR, Estimated: 10 mL/min — ABNORMAL LOW (ref >60)
Glucose, Bld: 126 mg/dL — ABNORMAL HIGH (ref 70–99)
Potassium: 4.3 mmol/L (ref 3.5–5.1)
Sodium: 140 mmol/L (ref 135–145)
Total Bilirubin: 0.5 mg/dL (ref 0.3–1.2)
Total Protein: 6.4 g/dL — ABNORMAL LOW (ref 6.5–8.1)

## 2022-11-29 LAB — TROPONIN I (HIGH SENSITIVITY): Troponin I (High Sensitivity): 94 ng/L — ABNORMAL HIGH (ref <18)

## 2022-11-29 LAB — RAPID URINE DRUG SCREEN, HOSP PERFORMED
Amphetamines: NOT DETECTED
Barbiturates: NOT DETECTED
Benzodiazepines: NOT DETECTED
Cocaine: NOT DETECTED
Opiates: NOT DETECTED
Tetrahydrocannabinol: NOT DETECTED

## 2022-11-29 LAB — ECHOCARDIOGRAM COMPLETE
Area-P 1/2: 3.06 cm2
Est EF: 55
Height: 61 in
MV M vel: 4.68 m/s
MV Peak grad: 87.6 mmHg
Radius: 0.5 cm
S' Lateral: 3.5 cm
Weight: 3037.06 oz

## 2022-11-29 LAB — URINALYSIS, ROUTINE W REFLEX MICROSCOPIC
Bilirubin Urine: NEGATIVE
Glucose, UA: NEGATIVE mg/dL
Hgb urine dipstick: NEGATIVE
Ketones, ur: NEGATIVE mg/dL
Leukocytes,Ua: NEGATIVE
Nitrite: NEGATIVE
Protein, ur: 100 mg/dL — AB
Specific Gravity, Urine: 1.013 (ref 1.005–1.030)
pH: 5 (ref 5.0–8.0)

## 2022-11-29 LAB — GLUCOSE, CAPILLARY
Glucose-Capillary: 118 mg/dL — ABNORMAL HIGH (ref 70–99)
Glucose-Capillary: 125 mg/dL — ABNORMAL HIGH (ref 70–99)
Glucose-Capillary: 127 mg/dL — ABNORMAL HIGH (ref 70–99)
Glucose-Capillary: 137 mg/dL — ABNORMAL HIGH (ref 70–99)
Glucose-Capillary: 149 mg/dL — ABNORMAL HIGH (ref 70–99)
Glucose-Capillary: 150 mg/dL — ABNORMAL HIGH (ref 70–99)
Glucose-Capillary: 93 mg/dL (ref 70–99)

## 2022-11-29 LAB — HEMOGLOBIN A1C
Hgb A1c MFr Bld: 5.9 % — ABNORMAL HIGH (ref 4.8–5.6)
Mean Plasma Glucose: 122.63 mg/dL

## 2022-11-29 LAB — CBC
HCT: 31.3 % — ABNORMAL LOW (ref 36.0–46.0)
Hemoglobin: 9 g/dL — ABNORMAL LOW (ref 12.0–15.0)
MCH: 27.5 pg (ref 26.0–34.0)
MCHC: 28.8 g/dL — ABNORMAL LOW (ref 30.0–36.0)
MCV: 95.7 fL (ref 80.0–100.0)
Platelets: 135 10*3/uL — ABNORMAL LOW (ref 150–400)
RBC: 3.27 MIL/uL — ABNORMAL LOW (ref 3.87–5.11)
RDW: 16.2 % — ABNORMAL HIGH (ref 11.5–15.5)
WBC: 6.3 10*3/uL (ref 4.0–10.5)
nRBC: 0.3 % — ABNORMAL HIGH (ref 0.0–0.2)

## 2022-11-29 LAB — MRSA NEXT GEN BY PCR, NASAL: MRSA by PCR Next Gen: NOT DETECTED

## 2022-11-29 LAB — APTT
aPTT: 164 seconds (ref 24–36)
aPTT: 189 seconds (ref 24–36)
aPTT: 37 seconds — ABNORMAL HIGH (ref 24–36)
aPTT: 78 seconds — ABNORMAL HIGH (ref 24–36)

## 2022-11-29 LAB — RESP PANEL BY RT-PCR (RSV, FLU A&B, COVID)  RVPGX2
Influenza A by PCR: POSITIVE — AB
Influenza B by PCR: NEGATIVE
Resp Syncytial Virus by PCR: NEGATIVE
SARS Coronavirus 2 by RT PCR: NEGATIVE

## 2022-11-29 LAB — HEPARIN LEVEL (UNFRACTIONATED): Heparin Unfractionated: 1.1 IU/mL — ABNORMAL HIGH (ref 0.30–0.70)

## 2022-11-29 LAB — AMMONIA: Ammonia: 19 umol/L (ref 9–35)

## 2022-11-29 LAB — PROTIME-INR
INR: 1.5 — ABNORMAL HIGH (ref 0.8–1.2)
Prothrombin Time: 18.2 seconds — ABNORMAL HIGH (ref 11.4–15.2)

## 2022-11-29 LAB — TSH: TSH: 1.59 u[IU]/mL (ref 0.350–4.500)

## 2022-11-29 LAB — ETHANOL: Alcohol, Ethyl (B): <10 mg/dL (ref <10)

## 2022-11-29 LAB — MAGNESIUM: Magnesium: 2.3 mg/dL (ref 1.7–2.4)

## 2022-11-29 MED ORDER — CHLORHEXIDINE GLUCONATE CLOTH 2 % EX PADS
6.0000 | MEDICATED_PAD | Freq: Every day | CUTANEOUS | Status: DC
  Administered 2022-11-29 – 2022-12-08 (×8): 6 via TOPICAL

## 2022-11-29 MED ORDER — METHYLPREDNISOLONE SODIUM SUCC 40 MG IJ SOLR
40.0000 mg | Freq: Every day | INTRAMUSCULAR | Status: DC
  Administered 2022-11-30: 40 mg via INTRAVENOUS
  Filled 2022-11-29: qty 1

## 2022-11-29 MED ORDER — OSELTAMIVIR PHOSPHATE 6 MG/ML PO SUSR
30.0000 mg | Freq: Every day | ORAL | Status: DC
  Administered 2022-11-29 – 2022-11-30 (×2): 30 mg via ORAL
  Filled 2022-11-29 (×3): qty 12.5

## 2022-11-29 MED ORDER — ORAL CARE MOUTH RINSE
15.0000 mL | OROMUCOSAL | Status: DC
  Administered 2022-11-29 – 2022-12-08 (×36): 15 mL via OROMUCOSAL

## 2022-11-29 MED ORDER — FUROSEMIDE 10 MG/ML IJ SOLN
120.0000 mg | Freq: Once | INTRAVENOUS | Status: AC
  Administered 2022-11-29: 120 mg via INTRAVENOUS
  Filled 2022-11-29: qty 10

## 2022-11-29 MED ORDER — ORAL CARE MOUTH RINSE: 15.0000 mL | OROMUCOSAL | Status: DC | PRN

## 2022-11-29 MED ORDER — HEPARIN (PORCINE) 25000 UT/250ML-% IV SOLN
750.0000 [IU]/h | INTRAVENOUS | Status: DC
  Administered 2022-11-29: 750 [IU]/h via INTRAVENOUS
  Filled 2022-11-29: qty 250

## 2022-11-29 MED ORDER — PERFLUTREN LIPID MICROSPHERE
1.0000 mL | INTRAVENOUS | Status: AC | PRN
  Administered 2022-11-29: 2 mL via INTRAVENOUS

## 2022-11-29 MED ORDER — INSULIN ASPART 100 UNIT/ML IJ SOLN
0.0000 [IU] | INTRAMUSCULAR | Status: DC
  Administered 2022-11-29 – 2022-12-01 (×4): 2 [IU] via SUBCUTANEOUS
  Administered 2022-12-01 (×2): 3 [IU] via SUBCUTANEOUS
  Administered 2022-12-02 (×3): 5 [IU] via SUBCUTANEOUS
  Administered 2022-12-03 (×2): 3 [IU] via SUBCUTANEOUS
  Administered 2022-12-04: 2 [IU] via SUBCUTANEOUS
  Administered 2022-12-04: 5 [IU] via SUBCUTANEOUS
  Administered 2022-12-04: 2 [IU] via SUBCUTANEOUS
  Administered 2022-12-04: 3 [IU] via SUBCUTANEOUS
  Administered 2022-12-04 – 2022-12-05 (×2): 2 [IU] via SUBCUTANEOUS
  Administered 2022-12-05: 5 [IU] via SUBCUTANEOUS
  Administered 2022-12-05: 3 [IU] via SUBCUTANEOUS
  Administered 2022-12-05: 2 [IU] via SUBCUTANEOUS
  Administered 2022-12-06: 3 [IU] via SUBCUTANEOUS

## 2022-11-29 MED ORDER — HEPARIN (PORCINE) 25000 UT/250ML-% IV SOLN
550.0000 [IU]/h | INTRAVENOUS | Status: AC
  Administered 2022-11-30 (×2): 550 [IU]/h via INTRAVENOUS
  Filled 2022-11-29: qty 250

## 2022-11-29 MED ORDER — SODIUM CHLORIDE 0.9% FLUSH
3.0000 mL | Freq: Two times a day (BID) | INTRAVENOUS | Status: DC
  Administered 2022-11-29 – 2022-12-08 (×17): 3 mL via INTRAVENOUS

## 2022-11-29 NOTE — Progress Notes (Signed)
[place] for Heparin (Apixaban on hold) Indication: atrial fibrillation  Allergies  Allergen Reactions   Other Other (See Comments)    "Tapes tears off my skin"   Latex Other (See Comments)    "Blisters my skin and tears it off"- skin burns, too   Penicillins Nausea Only   Tape Other (See Comments)    "Tapes tears off my skin"   Aspirin Other (See Comments)    Irritates the stomach   Atorvastatin Other (See Comments)    Caused fatigue   Diltiazem Hcl Nausea And Vomiting    Patient Measurements: Height: 5\' 1"  (154.9 cm) Weight: 86.1 kg (189 lb 13.1 oz) IBW/kg (Calculated) : 47.8 Dosing weight: ~67 kg  Vital Signs: Temp: 97.7 F (36.5 C) (01/15 1800) Temp Source: Bladder (01/15 1547) BP: 100/58 (01/15 1800) Pulse Rate: 54 (01/15 1800)  Labs: Recent Labs    11/28/22 1928 11/28/22 2007 11/28/22 2149 11/28/22 2351 11/28/22 2351 11/29/22 0153 11/29/22 1048 11/29/22 1944 11/29/22 2203  HGB 9.4* 10.5*  10.2* 10.5*  --   --  9.0*  --   --   --   HCT 33.0* 31.0*  30.0* 31.0*  --   --  31.3*  --   --   --   PLT 137*  --   --   --   --  135*  --   --   --   APTT  --   --   --  37*   < >  --  78* 189* 164*  LABPROT  --   --   --  18.2*  --   --   --   --   --   INR  --   --   --  1.5*  --   --   --   --   --   HEPARINUNFRC  --   --   --   --   --   --  1.10*  --   --   CREATININE 4.11* 4.10*  --   --   --  4.04*  --   --   --   TROPONINIHS 101*  --   --  94*  --   --   --   --   --    < > = values in this interval not displayed.     Estimated Creatinine Clearance: 10.3 mL/min (A) (by C-G formula based on SCr of 4.04 mg/dL (H)).   Medical History: Past Medical History:  Diagnosis Date   Acute CVA (cerebrovascular accident) ([place]) 07/12/2020   Hypertension    Renal disorder    CKD Stage IV   Stroke [facility])      Assessment: 87 y/o F with hypotension/heart failure, on apixaban PTA for A-fib, holding Apixaban and starting  heparin for now, acute on chronic CKD, Hgb 10.5, Plts 137, anticipate using aPTT to dose for now  1/15 PM update:  aPTT supra-therapeutic   Goal of Therapy:  Heparin level 0.3-0.7 units/ml aPTT 66-102 seconds Monitor platelets by anticoagulation protocol: Yes   Plan:  Hold heparin x 1 hr Re-start heparin at 550 units/hr Check heparin level and aPTT in 8 hours Daily CBC, heparin level, and aPTT Monitor for bleeding  Narda Bonds, PharmD, BCPS Clinical Pharmacist Phone: [phone]

## 2022-11-29 NOTE — Progress Notes (Signed)
Pt had PRN Bipap orders, no distress noted at this time.

## 2022-11-29 NOTE — Progress Notes (Signed)
Echocardiogram 2D Echocardiogram has been performed.  Oneal Deputy Toshiyuki Fredell RDCS 11/29/2022, 9:32 AM

## 2022-11-29 NOTE — Progress Notes (Signed)
NAME:  Jillian [name], MRN:  [id number], DOB:  09-05-1936, LOS: 1 ADMISSION DATE:  11/28/2022, CONSULTATION DATE:  1/14 REFERRING MD:  Dr. Mayra Neer, CHIEF COMPLAINT:  afib rvr; shock   History of Present Illness:  Patient is a 87 yo F w/ pertinent PMH CKD 4, Chronic diastolic HF, CVA, HTN, paroxysmal Afib on eliquis presents to [facility] ED on 1/14 w/ resp distress.  Upon arrival to [facility], patient in resp distress w/ RR 24. Afebrile. Placed on BiPAP. Given breathing treatments, lasix, and started on steroids. CXR suggestive of CHF. VBG 7.2, 64, 65, 25. Patient altered w/ GCS 12-13. Afib rvr w/ rate 170s. BP 121/79. Patient cardioverted and rate in 50-60s and became hypotensive 88/43. Started on levo 2 mcg. PCCM consulted.  Pertinent labs: Glucose 124, K 4.1, bun, 53, creat 4.11 (baseline 2.2), ast 71, alt 74, alk phosph 151, hgb 9.4, platelets 137, trop 101, wbc 7.1, hgb 9.4, platelets 137.  Pertinent  Medical History   Past Medical History:  Diagnosis Date   Acute CVA (cerebrovascular accident) ([place]) 07/12/2020   Hypertension    Renal disorder    CKD Stage IV   Stroke ([place])      Significant Hospital Events: Including procedures, antibiotic start and stop dates in addition to other pertinent events   1/14 admitted to [facility] resp distress on bipap; afib rvr cardioverted then hypotensive and bradycardic placed on levo  Interim History / Subjective:     Objective   Blood pressure (!) 124/56, pulse (!) 56, temperature 98.1 F (36.7 C), temperature source Bladder, resp. rate 13, height 5\' 1"  (1.549 m), weight 86.1 kg, SpO2 100 %.    FiO2 (%):  [40 %] 40 %   Intake/Output Summary (Last 24 hours) at 11/29/2022 0902 Last data filed at 11/29/2022 0700 Gross per 24 hour  Intake 310.57 ml  Output 125 ml  Net 185.57 ml   Filed Weights   11/29/22 0045 11/29/22 0500  Weight: 86.1 kg 86.1 kg    Examination: Gen:      No acute distress, elderly female on bipap HEENT:  EOMI, sclera anicteric Neck:      No masses; no thyromegaly Lungs:    Clear to auscultation bilaterally; normal respiratory effort CV:         Regular rate and rhythm; no murmurs Abd:      + bowel sounds; soft, non-tender; no palpable masses, no distension Ext:   2+ edema; adequate peripheral perfusion Skin:      Warm and dry; no rash Neuro: Somnolent aroudable  Labs/imaging reviewed Significant for BUN/creatinine 54/4.04 Alk phos 136, AST 64, ALT 70 Hemoglobin 9.0, platelets 135   Resolved Hospital Problem list     Assessment & Plan:   Acute respiratory failure w/ hypercarbia -Likely CHF exacerbation > COPD Continue BiPAP Wean down FiO2 Repeat IV Lasix dose 120 mg Continue as needed nebs, steroids for now. Reduce Solu-Medrol dose to 40 mg daily past wheezing is better  Afib RVR s/p cardioversion Bradycardic: post cardioversion Shock: likely cardiogenic? Hx of afib on eliquis Elevated Trop: likely demand ischemia Plan: IV Lasix as above Wean off pressors Continue heparin drip Follow echocardiogram  Acute encephalopathy Ammonia is elevated Plan: -cont bipap for normocapnia -limit sedating meds  Chronic diastolic HF: echo 78/9381 EF 55-60% HTN Plan: Follow echo IV Lasix as above  AKI on CKD 4: baseline creat 2.2 Plan: Monitor urine output and creatinine.  May need nephrology consult  Mildly elevated LFTs: likely congestion Plan: Follow  CMP  Anemia Thrombocytopenia Plan: Follow CBC  Hx of CVA: recent L MCA stroke 09/2022 Plan: -hold statin while npo and elevated LFTs  Best Practice (right click and "Reselect all SmartList Selections" daily)   Diet/type: NPO DVT prophylaxis: systemic heparin GI prophylaxis: N/A Lines: N/A Foley:  Yes, and it is still needed Code Status:  full code Last date of multidisciplinary goals of care discussion [1/14 updated daughter [name] over phone; wants patient to remain full code.]  Critical care time:    The patient is critically ill with  multiple organ system failure and requires high complexity decision making for assessment and support, frequent evaluation and titration of therapies, advanced monitoring, review of radiographic studies and interpretation of complex data.   Critical Care Time devoted to patient care services, exclusive of separately billable procedures, described in this note is 35 minutes.   Marshell Garfinkel MD [place] [facility] See Amion for pager  If no response to pager , please call [phone] until 7pm After 7:00 pm call Elink  [phone] 11/29/2022, 9:02 AM

## 2022-11-29 NOTE — Progress Notes (Signed)
ANTICOAGULATION CONSULT NOTE - Initial Consult  Pharmacy Consult for Heparin (Apixaban on hold) Indication: atrial fibrillation  Allergies  Allergen Reactions   Other Other (See Comments)    "Tapes tears off my skin"   Latex Other (See Comments)    "Blisters my skin and tears it off"- skin burns, too   Penicillins Nausea Only   Tape Other (See Comments)    "Tapes tears off my skin"   Aspirin Other (See Comments)    Irritates the stomach   Atorvastatin Other (See Comments)    Caused fatigue   Diltiazem Hcl Nausea And Vomiting    Patient Measurements: Height: 5\' 1"  (154.9 cm) Weight: 86.1 kg (189 lb 13.1 oz) IBW/kg (Calculated) : 47.8 Dosing weight: ~67 kg  Vital Signs: Temp: 99.5 F (37.5 C) (01/15 0130) Temp Source: Bladder (01/15 0045) BP: 107/42 (01/15 0130) Pulse Rate: 51 (01/15 0130)  Labs: Recent Labs    11/28/22 1928 11/28/22 2007 11/28/22 2149 11/28/22 2351  HGB 9.4* 10.5*  10.2* 10.5*  --   HCT 33.0* 31.0*  30.0* 31.0*  --   PLT 137*  --   --   --   APTT  --   --   --  37*  LABPROT  --   --   --  18.2*  INR  --   --   --  1.5*  CREATININE 4.11* 4.10*  --   --   TROPONINIHS 101*  --   --  94*    Estimated Creatinine Clearance: 10.2 mL/min (A) (by C-G formula based on SCr of 4.1 mg/dL (H)).   Medical History: Past Medical History:  Diagnosis Date   Acute CVA (cerebrovascular accident) ([place]) 07/12/2020   Hypertension    Renal disorder    CKD Stage IV   Stroke [facility])      Assessment: 87 y/o F with hypotension/heart failure, on apixaban PTA for A-fib, holding Apixaban and starting heparin for now, acute on chronic CKD, Hgb 10.5, Plts 137, anticipate using aPTT to dose for now  Goal of Therapy:  Heparin level 0.3-0.7 units/ml aPTT 66-102 seconds Monitor platelets by anticoagulation protocol: Yes   Plan:  Start heparin drip at 750 units/hr Check heparin level and aPTT in 8 hours Daily CBC, heparin level, and aPTT Monitor for  bleeding  Narda Bonds, PharmD, BCPS Clinical Pharmacist Phone: [phone]

## 2022-11-29 NOTE — Progress Notes (Signed)
Pt arrives from the ER with following personal belongings:  - 1 black cell phone - 1 set of keys - 1 blue/white robe - 1 T-shirt - 1 pair of pants - 2 sandals

## 2022-11-29 NOTE — Progress Notes (Signed)
ANTICOAGULATION CONSULT NOTE - Follow-up Consult  Pharmacy Consult for Heparin (Apixaban on hold) Indication: atrial fibrillation  Allergies  Allergen Reactions   Other Other (See Comments)    "Tapes tears off my skin"   Latex Other (See Comments)    "Blisters my skin and tears it off"- skin burns, too   Penicillins Nausea Only   Tape Other (See Comments)    "Tapes tears off my skin"   Aspirin Other (See Comments)    Irritates the stomach   Atorvastatin Other (See Comments)    Caused fatigue   Diltiazem Hcl Nausea And Vomiting    Patient Measurements: Height: 5\' 1"  (154.9 cm) Weight: 86.1 kg (189 lb 13.1 oz) IBW/kg (Calculated) : 47.8 Dosing weight: ~67 kg  Vital Signs: Temp: 97.9 F (36.6 C) (01/15 1128) Temp Source: Bladder (01/15 1128) BP: 114/52 (01/15 1000) Pulse Rate: 58 (01/15 1126)  Labs: Recent Labs    11/28/22 1928 11/28/22 2007 11/28/22 2149 11/28/22 2351 11/29/22 0153 11/29/22 1048  HGB 9.4* 10.5*  10.2* 10.5*  --  9.0*  --   HCT 33.0* 31.0*  30.0* 31.0*  --  31.3*  --   PLT 137*  --   --   --  135*  --   APTT  --   --   --  37*  --  78*  LABPROT  --   --   --  18.2*  --   --   INR  --   --   --  1.5*  --   --   HEPARINUNFRC  --   --   --   --   --  1.10*  CREATININE 4.11* 4.10*  --   --  4.04*  --   TROPONINIHS 101*  --   --  94*  --   --     Estimated Creatinine Clearance: 10.3 mL/min (A) (by C-G formula based on SCr of 4.04 mg/dL (H)).  Assessment: 87 y/o F with hypotension/heart failure, on apixaban PTA for A-fib, holding Apixaban and starting heparin for now, acute on chronic CKD, Hgb 10.5, Plts 137, anticipate using aPTT to dose for now.   aPTT 78 - at goal on current rate of 750 units/hr. (Heparin level remains falsely elevated in setting of recent Apixaban so utilizing aPTT's for now to guide therapy until levels correlate).   Goal of Therapy:  Heparin level 0.3-0.7 units/ml aPTT 66-102 seconds Monitor platelets by anticoagulation  protocol: Yes   Plan:  Continue heparin drip at 750 units/hr Repeat aPTT in 8 hours to confirm.  Daily CBC, heparin level, and aPTT Monitor for bleeding  Sloan Leiter, PharmD, BCPS, BCCCP Please refer to [facility] for [place] numbers 11/29/2022, 11:56 AM

## 2022-11-29 NOTE — Progress Notes (Signed)
RT and RN transported BIPAP patient to 88M room 08. Vital signs stable through out.

## 2022-11-29 NOTE — Progress Notes (Signed)
RT removed pt from BiPAP and placed pt on 3L [place]. Pt tolerating well with SVS and no complications at this time. RT left BiPAP on stby at bedside. RN notified.

## 2022-11-30 ENCOUNTER — Inpatient Hospital Stay (HOSPITAL_COMMUNITY): Payer: Medicare Other

## 2022-11-30 DIAGNOSIS — J9602 Acute respiratory failure with hypercapnia: Secondary | ICD-10-CM | POA: Diagnosis not present

## 2022-11-30 LAB — CBC
HCT: 32.2 % — ABNORMAL LOW (ref 36.0–46.0)
Hemoglobin: 9.2 g/dL — ABNORMAL LOW (ref 12.0–15.0)
MCH: 26.7 pg (ref 26.0–34.0)
MCHC: 28.6 g/dL — ABNORMAL LOW (ref 30.0–36.0)
MCV: 93.3 fL (ref 80.0–100.0)
Platelets: 142 10*3/uL — ABNORMAL LOW (ref 150–400)
RBC: 3.45 MIL/uL — ABNORMAL LOW (ref 3.87–5.11)
RDW: 16.1 % — ABNORMAL HIGH (ref 11.5–15.5)
WBC: 7.7 10*3/uL (ref 4.0–10.5)
nRBC: 0.3 % — ABNORMAL HIGH (ref 0.0–0.2)

## 2022-11-30 LAB — POCT I-STAT 7, (LYTES, BLD GAS, ICA,H+H)
Acid-Base Excess: 0 mmol/L (ref 0.0–2.0)
Bicarbonate: 29.2 mmol/L — ABNORMAL HIGH (ref 20.0–28.0)
Calcium, Ion: 1.15 mmol/L (ref 1.15–1.40)
HCT: 29 % — ABNORMAL LOW (ref 36.0–46.0)
Hemoglobin: 9.9 g/dL — ABNORMAL LOW (ref 12.0–15.0)
O2 Saturation: 99 %
Potassium: 4.5 mmol/L (ref 3.5–5.1)
Sodium: 138 mmol/L (ref 135–145)
TCO2: 31 mmol/L (ref 22–32)
pCO2 arterial: 76.2 mmHg (ref 32–48)
pH, Arterial: 7.191 — CL (ref 7.35–7.45)
pO2, Arterial: 160 mmHg — ABNORMAL HIGH (ref 83–108)

## 2022-11-30 LAB — BASIC METABOLIC PANEL
Anion gap: 11 (ref 5–15)
BUN: 68 mg/dL — ABNORMAL HIGH (ref 8–23)
CO2: 26 mmol/L (ref 22–32)
Calcium: 7.8 mg/dL — ABNORMAL LOW (ref 8.9–10.3)
Chloride: 101 mmol/L (ref 98–111)
Creatinine, Ser: 4.42 mg/dL — ABNORMAL HIGH (ref 0.44–1.00)
GFR, Estimated: 9 mL/min — ABNORMAL LOW (ref >60)
Glucose, Bld: 119 mg/dL — ABNORMAL HIGH (ref 70–99)
Potassium: 4.6 mmol/L (ref 3.5–5.1)
Sodium: 138 mmol/L (ref 135–145)

## 2022-11-30 LAB — COMPREHENSIVE METABOLIC PANEL
ALT: 73 U/L — ABNORMAL HIGH (ref 0–44)
AST: 67 U/L — ABNORMAL HIGH (ref 15–41)
Albumin: 2.7 g/dL — ABNORMAL LOW (ref 3.5–5.0)
Alkaline Phosphatase: 134 U/L — ABNORMAL HIGH (ref 38–126)
Anion gap: 11 (ref 5–15)
BUN: 69 mg/dL — ABNORMAL HIGH (ref 8–23)
CO2: 25 mmol/L (ref 22–32)
Calcium: 7.9 mg/dL — ABNORMAL LOW (ref 8.9–10.3)
Chloride: 102 mmol/L (ref 98–111)
Creatinine, Ser: 4.46 mg/dL — ABNORMAL HIGH (ref 0.44–1.00)
GFR, Estimated: 9 mL/min — ABNORMAL LOW (ref >60)
Glucose, Bld: 113 mg/dL — ABNORMAL HIGH (ref 70–99)
Potassium: 4.7 mmol/L (ref 3.5–5.1)
Sodium: 138 mmol/L (ref 135–145)
Total Bilirubin: 0.6 mg/dL (ref 0.3–1.2)
Total Protein: 6.3 g/dL — ABNORMAL LOW (ref 6.5–8.1)

## 2022-11-30 LAB — HEPARIN LEVEL (UNFRACTIONATED)
Heparin Unfractionated: 0.82 IU/mL — ABNORMAL HIGH (ref 0.30–0.70)
Heparin Unfractionated: 0.72 IU/mL — ABNORMAL HIGH (ref 0.30–0.70)

## 2022-11-30 LAB — GLUCOSE, CAPILLARY
Glucose-Capillary: 102 mg/dL — ABNORMAL HIGH (ref 70–99)
Glucose-Capillary: 103 mg/dL — ABNORMAL HIGH (ref 70–99)
Glucose-Capillary: 106 mg/dL — ABNORMAL HIGH (ref 70–99)
Glucose-Capillary: 109 mg/dL — ABNORMAL HIGH (ref 70–99)
Glucose-Capillary: 110 mg/dL — ABNORMAL HIGH (ref 70–99)
Glucose-Capillary: 111 mg/dL — ABNORMAL HIGH (ref 70–99)

## 2022-11-30 LAB — APTT
aPTT: 81 seconds — ABNORMAL HIGH (ref 24–36)
aPTT: 88 seconds — ABNORMAL HIGH (ref 24–36)

## 2022-11-30 MED ORDER — OSELTAMIVIR PHOSPHATE 6 MG/ML PO SUSR
30.0000 mg | ORAL | Status: DC
  Administered 2022-12-02: 30 mg via ORAL
  Filled 2022-11-30 (×3): qty 12.5

## 2022-11-30 MED ORDER — IPRATROPIUM-ALBUTEROL 0.5-2.5 (3) MG/3ML IN SOLN
3.0000 mL | RESPIRATORY_TRACT | Status: DC
  Administered 2022-11-30 – 2022-12-01 (×6): 3 mL via RESPIRATORY_TRACT
  Filled 2022-11-30 (×3): qty 3

## 2022-11-30 MED ORDER — METHYLPREDNISOLONE SODIUM SUCC 40 MG IJ SOLR
40.0000 mg | Freq: Two times a day (BID) | INTRAMUSCULAR | Status: DC
  Administered 2022-11-30 – 2022-12-04 (×8): 40 mg via INTRAVENOUS
  Filled 2022-11-30 (×9): qty 1

## 2022-11-30 MED ORDER — LACTATED RINGERS IV SOLN: INTRAVENOUS | Status: DC

## 2022-11-30 NOTE — Progress Notes (Addendum)
NAME:  Jillian [name], MRN:  [id number], DOB:  27-Sep-1936, LOS: 2 ADMISSION DATE:  11/28/2022, CONSULTATION DATE:  1/14 REFERRING MD:  Dr. Mayra Neer, CHIEF COMPLAINT:  afib rvr; shock   History of Present Illness:  Patient is a 87 yo F w/ pertinent PMH CKD 4, Chronic diastolic HF, CVA, HTN, paroxysmal Afib on eliquis presents to [facility] ED on 1/14 w/ resp distress.  Upon arrival to [facility], patient in resp distress w/ RR 24. Afebrile. Placed on BiPAP. Given breathing treatments, lasix, and started on steroids. CXR suggestive of CHF. VBG 7.2, 64, 65, 25. Patient altered w/ GCS 12-13. Afib rvr w/ rate 170s. BP 121/79. Patient cardioverted and rate in 50-60s and became hypotensive 88/43. Started on levo 2 mcg. PCCM consulted.  Pertinent labs: Glucose 124, K 4.1, bun, 53, creat 4.11 (baseline 2.2), ast 71, alt 74, alk phosph 151, hgb 9.4, platelets 137, trop 101, wbc 7.1, hgb 9.4, platelets 137.  Pertinent  Medical History   Past Medical History:  Diagnosis Date   Acute CVA (cerebrovascular accident) ([place]) 07/12/2020   Hypertension    Renal disorder    CKD Stage IV   Stroke ([place])      Significant Hospital Events: Including procedures, antibiotic start and stop dates in addition to other pertinent events   1/14 admitted to [facility] resp distress on bipap; afib rvr cardioverted then hypotensive and bradycardic placed on levo 1/15 started Tamiflu for positive flu PCR.  Did not respond to IV Lasix  Interim History / Subjective:   Off BiPAP.  Sleepy but arousable  Objective   Blood pressure (!) 163/124, pulse (!) 58, temperature 97.7 F (36.5 C), temperature source Bladder, resp. rate 17, height 5\' 1"  (1.549 m), weight 86.1 kg, SpO2 100 %.        Intake/Output Summary (Last 24 hours) at 11/30/2022 0941 Last data filed at 11/30/2022 0600 Gross per 24 hour  Intake 577.1 ml  Output 70 ml  Net 507.1 ml   Filed Weights   11/29/22 0045 11/29/22 0500  Weight: 86.1 kg 86.1 kg    Examination: Blood  pressure (!) 163/124, pulse (!) 58, temperature 97.7 F (36.5 C), temperature source Bladder, resp. rate 17, height 5\' 1"  (1.549 m), weight 86.1 kg, SpO2 100 %. Gen:      No acute distress HEENT:  EOMI, sclera anicteric Neck:     No masses; no thyromegaly Lungs:    Clear to auscultation bilaterally; normal respiratory effort CV:         Regular rate and rhythm; no murmurs Abd:      + bowel sounds; soft, non-tender; no palpable masses, no distension Ext:    No edema; adequate peripheral perfusion Skin:      Warm and dry; no rash Neuro: Somnolent, arousable  Labs/imaging reviewed BUN/creatinine 69/4.46, AST 67, ALT 73 Hemoglobin 9.2, platelets 142  Resolved Hospital Problem list     Assessment & Plan:  Acute respiratory failure w/ hypercarbia In the setting of flu a infection Continue BiPAP as needed check ABG Wean down FiO2 Tamiflu for 5 days Increase solumedrol to 40mg  q12 and schedule duonebs as she is wheezy  Afib RVR s/p cardioversion Bradycardic: post cardioversion Shock: likely cardiogenic? Hx of afib on eliquis Elevated Trop: likely demand ischemia.   Chronic diastolic heart failure.  Echo with LVEF 55% Plan: Hold Lasix as she has not responded to diuresis Continue heparin drip  Acute encephalopathy Ammonia is elevated Plan: Continue BiPAP as needed  AKI on CKD  4: baseline creat 2.2 Plan: Monitor urine output and creatinine.  Start gentle IV fluids today and monitor There is no immediate need for dialysis.  May need nephrology consult if numbers look worse  Mildly elevated LFTs: likely congestion Plan: Follow CMP  Anemia Thrombocytopenia Plan: Follow CBC  Hx of CVA: recent L MCA stroke 09/2022 Plan: -hold statin while npo and elevated LFTs  Transfer out of ICU if ABG looks ok  Best Practice (right click and "Reselect all SmartList Selections" daily)   Diet/type: NPO, swallow eval DVT prophylaxis: systemic heparin GI prophylaxis: N/A Lines:  N/A Foley:  Yes, and it is still needed Code Status:  full code Last date of multidisciplinary goals of care discussion [1/14 updated daughter [name] over phone; wants patient to remain full code.]  Critical care time:    The patient is critically ill with multiple organ system failure and requires high complexity decision making for assessment and support, frequent evaluation and titration of therapies, advanced monitoring, review of radiographic studies and interpretation of complex data.   Critical Care Time devoted to patient care services, exclusive of separately billable procedures, described in this note is 35 minutes.   Marshell Garfinkel MD [place] [facility] See Amion for pager  If no response to pager , please call [phone] until 7pm After 7:00 pm call Elink  [phone] 11/30/2022, 10:24 AM

## 2022-11-30 NOTE — Progress Notes (Signed)
ANTICOAGULATION CONSULT NOTE - Follow-up Consult  Pharmacy Consult for Heparin (Apixaban on hold) Indication: atrial fibrillation  Allergies  Allergen Reactions   Other Other (See Comments)    "Tapes tears off my skin"   Latex Other (See Comments)    "Blisters my skin and tears it off"- skin burns, too   Penicillins Nausea Only   Tape Other (See Comments)    "Tapes tears off my skin"   Aspirin Other (See Comments)    Irritates the stomach   Atorvastatin Other (See Comments)    Caused fatigue   Diltiazem Hcl Nausea And Vomiting    Patient Measurements: Height: 5\' 1"  (154.9 cm) Weight: 86.1 kg (189 lb 13.1 oz) IBW/kg (Calculated) : 47.8 Dosing weight: ~67 kg  Vital Signs: Temp: 97.7 F (36.5 C) (01/16 1000) Temp Source: Bladder (01/16 0800) BP: 121/64 (01/16 1000) Pulse Rate: 54 (01/16 1000)  Labs: Recent Labs    11/28/22 1928 11/28/22 2007 11/28/22 2351 11/29/22 0153 11/29/22 1048 11/29/22 1944 11/29/22 2203 11/30/22 0038 11/30/22 0320 11/30/22 0941 11/30/22 1018  HGB 9.4*   < >  --  9.0*  --   --   --   --  9.2*  --  9.9*  HCT 33.0*   < >  --  31.3*  --   --   --   --  32.2*  --  29.0*  PLT 137*  --   --  135*  --   --   --   --  142*  --   --   APTT  --    < > 37*  --  78* 189* 164*  --   --  81*  --   LABPROT  --   --  18.2*  --   --   --   --   --   --   --   --   INR  --   --  1.5*  --   --   --   --   --   --   --   --   HEPARINUNFRC  --   --   --   --  1.10*  --   --   --   --  0.82*  --   CREATININE 4.11*   < >  --  4.04*  --   --   --  4.42* 4.46*  --   --   TROPONINIHS 101*  --  94*  --   --   --   --   --   --   --   --    < > = values in this interval not displayed.     Estimated Creatinine Clearance: 9.4 mL/min (A) (by C-G formula based on SCr of 4.46 mg/dL (H)).  Assessment: 87 y/o F with hypotension/heart failure, on apixaban PTA for A-fib, holding Apixaban and starting heparin for now, acute on chronic CKD, Hgb 9.9, Plts 142.   aPTT 81  - at goal on current rate of 550 units/hr. (Heparin level remains falsely elevated in setting of recent Apixaban so utilizing aPTT's for now to guide therapy until levels correlate).   Goal of Therapy:  Heparin level 0.3-0.7 units/ml aPTT 66-102 seconds Monitor platelets by anticoagulation protocol: Yes   Plan:  Continue heparin drip at 550 units/hr Repeat aPTT in 8 hours to confirm @1900  -may transition to HL monitoring if clinically correlating Daily CBC, heparin level, and aPTT Monitor for bleeding  Wilson Singer,  PharmD Clinical Pharmacist 11/30/2022 11:06 AM

## 2022-11-30 NOTE — Progress Notes (Signed)
ANTICOAGULATION CONSULT NOTE - Follow-up Consult  Pharmacy Consult for Heparin (Apixaban on hold) Indication: atrial fibrillation  Allergies  Allergen Reactions   Other Other (See Comments)    "Tapes tears off my skin"   Latex Other (See Comments)    "Blisters my skin and tears it off"- skin burns, too   Penicillins Nausea Only   Tape Other (See Comments)    "Tapes tears off my skin"   Aspirin Other (See Comments)    Irritates the stomach   Atorvastatin Other (See Comments)    Caused fatigue   Diltiazem Hcl Nausea And Vomiting    Patient Measurements: Height: 5\' 1"  (154.9 cm) Weight: 86.1 kg (189 lb 13.1 oz) IBW/kg (Calculated) : 47.8 Dosing weight: ~67 kg  Vital Signs: Temp: 98.4 F (36.9 C) (01/16 1900) Temp Source: Bladder (01/16 1600) BP: 130/57 (01/16 1900) Pulse Rate: 52 (01/16 1937)  Labs: Recent Labs    11/28/22 1928 11/28/22 2007 11/28/22 2351 11/29/22 0153 11/29/22 1048 11/29/22 1944 11/29/22 2203 11/30/22 0038 11/30/22 0320 11/30/22 0941 11/30/22 1018 11/30/22 1949  HGB 9.4*   < >  --  9.0*  --   --   --   --  9.2*  --  9.9*  --   HCT 33.0*   < >  --  31.3*  --   --   --   --  32.2*  --  29.0*  --   PLT 137*  --   --  135*  --   --   --   --  142*  --   --   --   APTT  --    < > 37*  --  78*   < > 164*  --   --  81*  --  88*  LABPROT  --   --  18.2*  --   --   --   --   --   --   --   --   --   INR  --   --  1.5*  --   --   --   --   --   --   --   --   --   HEPARINUNFRC  --   --   --   --  1.10*  --   --   --   --  0.82*  --  0.72*  CREATININE 4.11*   < >  --  4.04*  --   --   --  4.42* 4.46*  --   --   --   TROPONINIHS 101*  --  94*  --   --   --   --   --   --   --   --   --    < > = values in this interval not displayed.     Estimated Creatinine Clearance: 9.4 mL/min (A) (by C-G formula based on SCr of 4.46 mg/dL (H)).  Assessment: 87 y/o F with hypotension/heart failure, on apixaban PTA for A-fib, holding Apixaban and starting heparin  for now, acute on chronic CKD.  aPTT 88 - at goal on current rate of 550 units/hr. (Heparin level remains falsely elevated in setting of recent Apixaban so utilizing aPTT's for now to guide therapy until levels correlate).   Goal of Therapy:  Heparin level 0.3-0.7 units/ml aPTT 66-102 seconds Monitor platelets by anticoagulation protocol: Yes   Plan:  Continue heparin drip at 550 units/hr F/u daily heparin level, aPTT,  and CBC  Sherlon Handing, PharmD, BCPS Please see amion for complete clinical pharmacist phone list 11/30/2022 8:44 PM

## 2022-11-30 NOTE — Evaluation (Signed)
Clinical/Bedside Swallow Evaluation Patient Details  Name: Jillian [name] MRN: [id number] Date of Birth: August 23, 1936  Today's Date: 11/30/2022 Time: SLP Start Time (ACUTE ONLY): 32 SLP Stop Time (ACUTE ONLY): 1035 SLP Time Calculation (min) (ACUTE ONLY): 15 min  Past Medical History:  Past Medical History:  Diagnosis Date   Acute CVA (cerebrovascular accident) ([place]) 07/12/2020   Hypertension    Renal disorder    CKD Stage IV   Stroke [facility])    Past Surgical History:  Past Surgical History:  Procedure Laterality Date   CARDIAC CATHETERIZATION  2009   HPI:  Patient is an 87 y.o. female with PMH: CKD stage 4, CVA, HTN, afib. She presented to the hospital on 11/28/22 secondary to acute respiratory distress with RR 24. She was afebrile. Placed on BiPAP and given breathing treatments, lasix and started on steroids. CXR suggestive of CHF. She was weaned off of BiPAP 11/30/22 to 3L supplemental via nasal cannula.    Assessment / Plan / Recommendation  Clinical Impression  Patient presenting with clinical s/s of dysphagia as per this bedside swallow evaluation which appear to be secondary to her current status of fatigue/lethargy and decreased respiratory status. Patient had audible congestion heard during breathing and although she did demonstrate a productive cough with thick light yellowish phlegm, she was not able to fully clear secretions and said that her throat hurt after coughing. She tolerated cup and straw sips of water as well as bites of puree (applesauce) without observed changes in SpO2 or RR and without overt s/s aspiration or penetration but amount of PO's consumed was minimal secondary to patient's lethargy. SLP recommending full liquids, thin consistency and will follow for diet toleration and readiness to advance with solids. SLP Visit Diagnosis: Dysphagia, unspecified (R13.10)    Aspiration Risk  Mild aspiration risk    Diet Recommendation Thin liquid;Other (Comment) (full  liquids)   Liquid Administration via: Cup;Straw Medication Administration: Whole meds with puree Supervision: Staff to assist with self feeding;Full supervision/cueing for compensatory strategies Compensations: Slow rate;Small sips/bites Postural Changes: Seated upright at 90 degrees    Other  Recommendations Oral Care Recommendations: Oral care BID    Recommendations for follow up therapy are one component of a multi-disciplinary discharge planning process, led by the attending physician.  Recommendations may be updated based on patient status, additional functional criteria and insurance authorization.  Follow up Recommendations Other (comment) (TBD)      Assistance Recommended at Discharge    Functional Status Assessment Patient has had a recent decline in their functional status and demonstrates the ability to make significant improvements in function in a reasonable and predictable amount of time.  Frequency and Duration min 2x/week  1 week       Prognosis Prognosis for Safe Diet Advancement: Good      Swallow Study   General Date of Onset: 11/28/22 HPI: Patient is an 87 y.o. female with PMH: CKD stage 4, CVA, HTN, afib. She presented to the hospital on 11/28/22 secondary to acute respiratory distress with RR 24. She was afebrile. Placed on BiPAP and given breathing treatments, lasix and started on steroids. CXR suggestive of CHF. She was weaned off of BiPAP 11/30/22 to 3L supplemental via nasal cannula. Type of Study: Bedside Swallow Evaluation Previous Swallow Assessment: none found Diet Prior to this Study: NPO Temperature Spikes Noted: No Respiratory Status: Nasal cannula History of Recent Intubation: No Behavior/Cognition: Alert;Cooperative;Pleasant mood;Lethargic/Drowsy Oral Cavity Assessment: Within Functional Limits Oral Care Completed by SLP: Yes Oral Cavity -  Dentition: Adequate natural dentition Vision: Functional for self-feeding Self-Feeding Abilities: Needs  set up;Needs assist Patient Positioning: Upright in bed Baseline Vocal Quality: Low vocal intensity Volitional Cough: Congested;Strong Volitional Swallow: Able to elicit    Oral/Motor/Sensory Function Overall Oral Motor/Sensory Function: Within functional limits   Ice Chips     Thin Liquid Thin Liquid: Within functional limits Presentation: Straw    Nectar Thick     Honey Thick     Puree Puree: Within functional limits Presentation: Spoon   Solid     Solid: Not tested      [name], MA, CCC-SLP Speech Therapy

## 2022-11-30 NOTE — Progress Notes (Addendum)
eLink Physician-Brief Progress Note Patient Name: Jillian [name] DOB: 22-Oct-1936 MRN: [id number]   Date of Service  11/30/2022  HPI/Events of Note  UOP 10 cc in this shift.  CHF.CKD stage 4.  Camera: On nasal o2. MAP > 80. A fib hx. In sinus now.    eICU Interventions  Continue current care Avoid nephrotoxic meds. Got 120 mg lasix earlier.  Consider nephrology consult in AM. Stat BMP     Intervention Category Intermediate Interventions: Oliguria - evaluation and management  Elmer Sow 11/30/2022, 12:13 AM  5:14 BMP reviewed, UOP 60 ml this shift.K at 4.7 Cr 4.46, AG11, co2 at 25 - consider Nephrology consultation, lasix drip ?

## 2022-12-01 DIAGNOSIS — J9602 Acute respiratory failure with hypercapnia: Secondary | ICD-10-CM | POA: Diagnosis not present

## 2022-12-01 LAB — POCT I-STAT 7, (LYTES, BLD GAS, ICA,H+H)
Acid-base deficit: 1 mmol/L (ref 0.0–2.0)
Bicarbonate: 27.1 mmol/L (ref 20.0–28.0)
Calcium, Ion: 1.17 mmol/L (ref 1.15–1.40)
HCT: 29 % — ABNORMAL LOW (ref 36.0–46.0)
Hemoglobin: 9.9 g/dL — ABNORMAL LOW (ref 12.0–15.0)
O2 Saturation: 99 %
Patient temperature: 36.4
Potassium: 4.8 mmol/L (ref 3.5–5.1)
Sodium: 138 mmol/L (ref 135–145)
TCO2: 29 mmol/L (ref 22–32)
pCO2 arterial: 61.7 mmHg — ABNORMAL HIGH (ref 32–48)
pH, Arterial: 7.248 — ABNORMAL LOW (ref 7.35–7.45)
pO2, Arterial: 144 mmHg — ABNORMAL HIGH (ref 83–108)

## 2022-12-01 LAB — CBC
HCT: 30.9 % — ABNORMAL LOW (ref 36.0–46.0)
Hemoglobin: 9.3 g/dL — ABNORMAL LOW (ref 12.0–15.0)
MCH: 27.4 pg (ref 26.0–34.0)
MCHC: 30.1 g/dL (ref 30.0–36.0)
MCV: 90.9 fL (ref 80.0–100.0)
Platelets: 144 10*3/uL — ABNORMAL LOW (ref 150–400)
RBC: 3.4 MIL/uL — ABNORMAL LOW (ref 3.87–5.11)
RDW: 16 % — ABNORMAL HIGH (ref 11.5–15.5)
WBC: 7.7 10*3/uL (ref 4.0–10.5)
nRBC: 0.6 % — ABNORMAL HIGH (ref 0.0–0.2)

## 2022-12-01 LAB — BASIC METABOLIC PANEL
Anion gap: 11 (ref 5–15)
BUN: 77 mg/dL — ABNORMAL HIGH (ref 8–23)
CO2: 25 mmol/L (ref 22–32)
Calcium: 8 mg/dL — ABNORMAL LOW (ref 8.9–10.3)
Chloride: 103 mmol/L (ref 98–111)
Creatinine, Ser: 4.44 mg/dL — ABNORMAL HIGH (ref 0.44–1.00)
GFR, Estimated: 9 mL/min — ABNORMAL LOW (ref >60)
Glucose, Bld: 100 mg/dL — ABNORMAL HIGH (ref 70–99)
Potassium: 4.7 mmol/L (ref 3.5–5.1)
Sodium: 139 mmol/L (ref 135–145)

## 2022-12-01 LAB — GLUCOSE, CAPILLARY
Glucose-Capillary: 111 mg/dL — ABNORMAL HIGH (ref 70–99)
Glucose-Capillary: 98 mg/dL (ref 70–99)
Glucose-Capillary: 145 mg/dL — ABNORMAL HIGH (ref 70–99)
Glucose-Capillary: 187 mg/dL — ABNORMAL HIGH (ref 70–99)
Glucose-Capillary: 164 mg/dL — ABNORMAL HIGH (ref 70–99)
Glucose-Capillary: 129 mg/dL — ABNORMAL HIGH (ref 70–99)

## 2022-12-01 LAB — APTT: aPTT: 87 seconds — ABNORMAL HIGH (ref 24–36)

## 2022-12-01 LAB — HEPARIN LEVEL (UNFRACTIONATED): Heparin Unfractionated: 0.55 IU/mL (ref 0.30–0.70)

## 2022-12-01 MED ORDER — REVEFENACIN 175 MCG/3ML IN SOLN
175.0000 ug | Freq: Every day | RESPIRATORY_TRACT | Status: DC
  Administered 2022-12-02 – 2022-12-08 (×7): 175 ug via RESPIRATORY_TRACT
  Filled 2022-12-01 (×7): qty 3

## 2022-12-01 MED ORDER — IPRATROPIUM-ALBUTEROL 0.5-2.5 (3) MG/3ML IN SOLN
3.0000 mL | RESPIRATORY_TRACT | Status: DC | PRN
  Administered 2022-12-01 – 2022-12-03 (×2): 3 mL via RESPIRATORY_TRACT
  Filled 2022-12-01: qty 3

## 2022-12-01 MED ORDER — ARFORMOTEROL TARTRATE 15 MCG/2ML IN NEBU
15.0000 ug | INHALATION_SOLUTION | Freq: Two times a day (BID) | RESPIRATORY_TRACT | Status: DC
  Administered 2022-12-01 – 2022-12-08 (×13): 15 ug via RESPIRATORY_TRACT
  Filled 2022-12-01 (×16): qty 2

## 2022-12-01 MED ORDER — APIXABAN 2.5 MG PO TABS
2.5000 mg | ORAL_TABLET | Freq: Two times a day (BID) | ORAL | Status: DC
  Administered 2022-12-01 – 2022-12-08 (×15): 2.5 mg via ORAL
  Filled 2022-12-01 (×15): qty 1

## 2022-12-01 MED ORDER — BUDESONIDE 0.5 MG/2ML IN SUSP
0.5000 mg | Freq: Two times a day (BID) | RESPIRATORY_TRACT | Status: DC
  Administered 2022-12-01 – 2022-12-08 (×13): 0.5 mg via RESPIRATORY_TRACT
  Filled 2022-12-01 (×14): qty 2

## 2022-12-01 MED ORDER — HYDRALAZINE HCL 20 MG/ML IJ SOLN
10.0000 mg | INTRAMUSCULAR | Status: DC | PRN
  Administered 2022-12-01 – 2022-12-05 (×8): 10 mg via INTRAVENOUS
  Filled 2022-12-01 (×7): qty 1

## 2022-12-01 NOTE — Progress Notes (Signed)
NAME:  Jillian [name], MRN:  [id number], DOB:  19-Jun-1936, LOS: 3 ADMISSION DATE:  11/28/2022, CONSULTATION DATE:  1/14 REFERRING MD:  Dr. Mayra Neer, CHIEF COMPLAINT:  afib rvr; shock   History of Present Illness:  Patient is a 87 yo F w/ pertinent PMH CKD 4, Chronic diastolic HF, CVA, HTN, paroxysmal Afib on eliquis presents to [facility] ED on 1/14 w/ resp distress.  Upon arrival to [facility], patient in resp distress w/ RR 24. Afebrile. Placed on BiPAP. Given breathing treatments, lasix, and started on steroids. CXR suggestive of CHF. VBG 7.2, 64, 65, 25. Patient altered w/ GCS 12-13. Afib rvr w/ rate 170s. BP 121/79. Patient cardioverted and rate in 50-60s and became hypotensive 88/43. Started on levo 2 mcg. PCCM consulted.  Pertinent labs: Glucose 124, K 4.1, bun, 53, creat 4.11 (baseline 2.2), ast 71, alt 74, alk phosph 151, hgb 9.4, platelets 137, trop 101, wbc 7.1, hgb 9.4, platelets 137.  Pertinent  Medical History   Past Medical History:  Diagnosis Date   Acute CVA (cerebrovascular accident) ([place]) 07/12/2020   Hypertension    Renal disorder    CKD Stage IV   Stroke ([place])    Significant Hospital Events: Including procedures, antibiotic start and stop dates in addition to other pertinent events   1/14 admitted to [facility] resp distress on bipap; afib rvr cardioverted then hypotensive and bradycardic placed on levo 1/15 Started Tamiflu for positive flu PCR.  Did not respond to IV Lasix 1/16 started IV fluids for AKI  Interim History / Subjective:   Continues on LR at 75.  More awake today.  Off BiPAP.  Objective   Blood pressure (!) 150/65, pulse (!) 53, temperature (!) 97.5 F (36.4 C), resp. rate 16, height 5\' 1"  (1.549 m), weight 88.9 kg, SpO2 100 %.    FiO2 (%):  [30 %-40 %] 30 %   Intake/Output Summary (Last 24 hours) at 12/01/2022 1245 Last data filed at 12/01/2022 0900 Gross per 24 hour  Intake 1932.68 ml  Output 430 ml  Net 1502.68 ml   Filed Weights   11/29/22 0045 11/29/22 0500  12/01/22 0500  Weight: 86.1 kg 86.1 kg 88.9 kg    Examination: Gen:      No acute distress HEENT:  EOMI, sclera anicteric Neck:     No masses; no thyromegaly Lungs:    Bilateral expiratory wheeze CV:         Regular rate and rhythm; no murmurs Abd:      + bowel sounds; soft, non-tender; no palpable masses, no distension Ext:    No edema; adequate peripheral perfusion Skin:      Warm and dry; no rash Neuro: alert and oriented x 3 Psych: normal mood and affect   Labs/imaging reviewed Significant for BUN/creatinine 77/4.44 No new imaging  Resolved Hospital Problem list     Assessment & Plan:  Acute respiratory failure w/ hypercarbia In the setting of flu a infection Continue BiPAP as needed, Repeat ABG Monitor mental status Wean down FiO2 Tamiflu for 5 days Solumedrol 40mg  q12.  Add Brovana, Pulmicort and Yupelri nebs as she has continued wheeze.  Change DuoNebs to as needed  Afib RVR s/p cardioversion Bradycardic: post cardioversion Shock: likely cardiogenic? Hx of afib on eliquis Elevated Trop: likely demand ischemia.   Chronic diastolic heart failure.  Echo with LVEF 55% Hold Lasix as she has not responded to diuresis Transition heparin drip to eliquis  Acute encephalopathy Ammonia is normal Continue BiPAP as needed  AKI on CKD 4: baseline creat 2.2 Monitor urine output and creatinine.  Continue gentle IV fluids today and monitor There is no immediate need for dialysis.  May need nephrology consult if numbers look worse  Mildly elevated LFTs: likely congestion Follow CMP  Anemia Thrombocytopenia Plan: Follow CBC  Hx of CVA: recent L MCA stroke 09/2022 Plan: Hold statin while elevated LFTs  Transfer out of ICU if ABG looks ok  Best Practice (right click and "Reselect all SmartList Selections" daily)   Diet/type: full liquids  DVT prophylaxis: systemic heparin GI prophylaxis: N/A Lines: N/A Foley:  Yes, and it is still needed Code Status:  full  code Last date of multidisciplinary goals of care discussion [1/14 updated daughter Jillian [name] over phone; wants patient to remain full code.]  Critical care time:    The patient is critically ill with multiple organ system failure and requires high complexity decision making for assessment and support, frequent evaluation and titration of therapies, advanced monitoring, review of radiographic studies and interpretation of complex data.   Critical Care Time devoted to patient care services, exclusive of separately billable procedures, described in this note is 35 minutes.   Marshell Garfinkel MD  Pulmonary & Critical care See Amion for pager  If no response to pager , please call [phone] until 7pm After 7:00 pm call Elink  [phone] 12/01/2022, 9:23 AM

## 2022-12-01 NOTE — Progress Notes (Signed)
Speech Language Pathology Treatment: Dysphagia  Patient Details Name: Jillian [name] MRN: [id number] DOB: [birth year] Today's Date: 12/01/2022 Time: 0945-1000 SLP Time Calculation (min) (ACUTE ONLY): 15 min  Assessment / Plan / Recommendation Clinical Impression  Patient seen by SLP for skilled treatment focused on dysphagia goals. Per chart she was on BiPAP all night but was able to come off in the morning. Patient was awake and alert but still is not fully interactive (not making eye contact, not initiating). She is able to communicate wants/needs when given options but does not initiate to comment or request. She will occasionally gesture such as when she needed to spit out phlegm. SLP observed her with PO intake of thin liquids (water, juice) and puree solids(applesauce). Swallow initiation appeared timely but patient did exhibit multiple swallows (not more than two) with liquids. She had one instance of productive cough and still has inspiratory wheezing however appears improved overall. SpO2 remained at 97% and RR was [facility].  SLP recommending to continue with current PO diet of full liquids (thin consistency) and will continue to follow for toleration and readiness to upgrade solids.    HPI HPI: Patient is an 87 y.o. female with PMH: CKD stage 4, CVA, HTN, afib. She presented to the hospital on 11/28/22 secondary to acute respiratory distress with RR 24. She was afebrile. Placed on BiPAP and given breathing treatments, lasix and started on steroids. CXR suggestive of CHF. She was weaned off of BiPAP 11/30/22 to 3L supplemental via nasal cannula.      SLP Plan  Continue with current plan of care      Recommendations for follow up therapy are one component of a multi-disciplinary discharge planning process, led by the attending physician.  Recommendations may be updated based on patient status, additional functional criteria and insurance authorization.    Recommendations  Diet recommendations:  Thin liquid;Other(comment) (full liquids) Liquids provided via: Cup;Straw Medication Administration: Whole meds with puree Supervision: Patient able to self feed;Full supervision/cueing for compensatory strategies Compensations: Slow rate;Small sips/bites Postural Changes and/or Swallow Maneuvers: Seated upright 90 degrees                Oral Care Recommendations: Oral care BID Follow Up Recommendations: Other (comment) Assistance recommended at discharge: Frequent or constant Supervision/Assistance SLP Visit Diagnosis: Dysphagia, unspecified (R13.10) Plan: Continue with current plan of care           [name], MA, CCC-SLP Speech Therapy

## 2022-12-01 NOTE — Telephone Encounter (Signed)
I returned the call to [facility] with [facility] to inform her that the patient is currently in the hospital and we do not have any order for [facility].  Message left with call back requested

## 2022-12-01 NOTE — Progress Notes (Signed)
Rt called to bed for Rapid response to put pt back on bipap urgently as pt c/o of SOB. Pt in severe distress. Pt placed back on bipap and breathing began to return to normal within a few minutes and pt distress decreased. RN at bedside and MD notified.

## 2022-12-01 NOTE — Progress Notes (Addendum)
ANTICOAGULATION CONSULT NOTE - Follow-up Consult  Pharmacy Consult for Heparin >Eliquis Indication: atrial fibrillation  Allergies  Allergen Reactions   Other Other (See Comments)    "Tapes tears off my skin"   Latex Other (See Comments)    "Blisters my skin and tears it off"- skin burns, too   Penicillins Nausea Only   Tape Other (See Comments)    "Tapes tears off my skin"   Aspirin Other (See Comments)    Irritates the stomach   Atorvastatin Other (See Comments)    Caused fatigue   Diltiazem Hcl Nausea And Vomiting    Patient Measurements: Height: 5\' 1"  (154.9 cm) Weight: 88.9 kg (195 lb 15.8 oz) IBW/kg (Calculated) : 47.8 Dosing weight: ~67 kg  Vital Signs: Temp: 97.7 F (36.5 C) (01/17 0800) Temp Source: Bladder (01/17 0800) BP: 127/78 (01/17 0800) Pulse Rate: 51 (01/17 0800)  Labs: Recent Labs    11/28/22 1928 11/28/22 2007 11/28/22 2351 11/29/22 0153 11/29/22 1048 11/30/22 0038 11/30/22 0320 11/30/22 0941 11/30/22 1018 11/30/22 1949 12/01/22 0801  HGB 9.4*   < >  --  9.0*  --   --  9.2*  --  9.9*  --  9.3*  HCT 33.0*   < >  --  31.3*  --   --  32.2*  --  29.0*  --  30.9*  PLT 137*  --   --  135*  --   --  142*  --   --   --  144*  APTT  --   --  37*  --    < >  --   --  81*  --  88* 87*  LABPROT  --   --  18.2*  --   --   --   --   --   --   --   --   INR  --   --  1.5*  --   --   --   --   --   --   --   --   HEPARINUNFRC  --   --   --   --    < >  --   --  0.82*  --  0.72* 0.55  CREATININE 4.11*   < >  --  4.04*  --  4.42* 4.46*  --   --   --  4.44*  TROPONINIHS 101*  --  94*  --   --   --   --   --   --   --   --    < > = values in this interval not displayed.    Estimated Creatinine Clearance: 9.6 mL/min (A) (by C-G formula based on SCr of 4.44 mg/dL (H)).  Assessment: 87 y/o F with hypotension/heart failure, on apixaban PTA for A-fib, holding Apixaban and starting heparin for now, acute on chronic CKD.  aPTT 87 / HL 0.55- at goal on current  rate of 550 units/hr. (Heparin level now clinically correlating with aPTTs. Will transition to using HL for monitoring of heparin infusion.  --  Will transition from heparin back to home Eliquis 87 yo ; 88 kg ; Scr 4.44 - qualifies for dose reduction for afib indication   Goal of Therapy:  Heparin level 0.3-0.7 units/ml aPTT 66-102 seconds Monitor platelets by anticoagulation protocol: Yes   Plan:  Give first dose of eliquis at time that heparin infusion is stopped Eliquis 2.5 mg BID  F/u CBC, s/sx bleeding   Watt Climes  Alroy Dust, PharmD Clinical Pharmacist 12/01/2022 9:15 AM

## 2022-12-02 ENCOUNTER — Inpatient Hospital Stay (HOSPITAL_COMMUNITY): Payer: Medicare Other

## 2022-12-02 DIAGNOSIS — J9602 Acute respiratory failure with hypercapnia: Secondary | ICD-10-CM | POA: Diagnosis not present

## 2022-12-02 LAB — COMPREHENSIVE METABOLIC PANEL
ALT: 61 U/L — ABNORMAL HIGH (ref 0–44)
AST: 37 U/L (ref 15–41)
Albumin: 2.4 g/dL — ABNORMAL LOW (ref 3.5–5.0)
Alkaline Phosphatase: 113 U/L (ref 38–126)
Anion gap: 10 (ref 5–15)
BUN: 81 mg/dL — ABNORMAL HIGH (ref 8–23)
CO2: 25 mmol/L (ref 22–32)
Calcium: 8.2 mg/dL — ABNORMAL LOW (ref 8.9–10.3)
Chloride: 102 mmol/L (ref 98–111)
Creatinine, Ser: 4.42 mg/dL — ABNORMAL HIGH (ref 0.44–1.00)
GFR, Estimated: 9 mL/min — ABNORMAL LOW (ref >60)
Glucose, Bld: 116 mg/dL — ABNORMAL HIGH (ref 70–99)
Potassium: 4.7 mmol/L (ref 3.5–5.1)
Sodium: 137 mmol/L (ref 135–145)
Total Bilirubin: 0.5 mg/dL (ref 0.3–1.2)
Total Protein: 6 g/dL — ABNORMAL LOW (ref 6.5–8.1)

## 2022-12-02 LAB — CBC
HCT: 29.1 % — ABNORMAL LOW (ref 36.0–46.0)
Hemoglobin: 9 g/dL — ABNORMAL LOW (ref 12.0–15.0)
MCH: 27.3 pg (ref 26.0–34.0)
MCHC: 30.9 g/dL (ref 30.0–36.0)
MCV: 88.2 fL (ref 80.0–100.0)
Platelets: 140 10*3/uL — ABNORMAL LOW (ref 150–400)
RBC: 3.3 MIL/uL — ABNORMAL LOW (ref 3.87–5.11)
RDW: 15.9 % — ABNORMAL HIGH (ref 11.5–15.5)
WBC: 8.2 10*3/uL (ref 4.0–10.5)
nRBC: 0.5 % — ABNORMAL HIGH (ref 0.0–0.2)

## 2022-12-02 LAB — GLUCOSE, CAPILLARY
Glucose-Capillary: 105 mg/dL — ABNORMAL HIGH (ref 70–99)
Glucose-Capillary: 111 mg/dL — ABNORMAL HIGH (ref 70–99)
Glucose-Capillary: 117 mg/dL — ABNORMAL HIGH (ref 70–99)
Glucose-Capillary: 203 mg/dL — ABNORMAL HIGH (ref 70–99)
Glucose-Capillary: 204 mg/dL — ABNORMAL HIGH (ref 70–99)
Glucose-Capillary: 210 mg/dL — ABNORMAL HIGH (ref 70–99)

## 2022-12-02 LAB — HEPARIN LEVEL (UNFRACTIONATED): Heparin Unfractionated: >1.1 IU/mL — ABNORMAL HIGH (ref 0.30–0.70)

## 2022-12-02 LAB — MAGNESIUM: Magnesium: 2.8 mg/dL — ABNORMAL HIGH (ref 1.7–2.4)

## 2022-12-02 MED ORDER — CARVEDILOL 25 MG PO TABS
25.0000 mg | ORAL_TABLET | Freq: Two times a day (BID) | ORAL | Status: DC
  Administered 2022-12-02: 25 mg via ORAL
  Filled 2022-12-02 (×2): qty 1

## 2022-12-02 MED ORDER — FUROSEMIDE 10 MG/ML IJ SOLN
60.0000 mg | Freq: Once | INTRAMUSCULAR | Status: AC
  Administered 2022-12-02: 60 mg via INTRAVENOUS
  Filled 2022-12-02: qty 6

## 2022-12-02 MED ORDER — BLISTEX MEDICATED EX OINT: TOPICAL_OINTMENT | CUTANEOUS | Status: DC | PRN

## 2022-12-02 NOTE — Consult Note (Signed)
Reason for Consult: AKI/CKD stage IV Referring Physician: Sloan Leiter, MD  Jillian [name] is an 87 y.o. female has a PMH significant for chronic combined systolic and diastolic CHF, paroxysmal atrial fibrillation, HTN, h/o CVA, and CKD stage IV who presented to [facility] ED via EMS on 11/28/22 with respiratory distress.  In the ED RR was 24 and placed on BiPAP and given breathing treatments, lasix, and steroids.  CXR suggestive of CHF.  Labs were notable for BUN 53, Cr 4.11, AST 71, ALT 74, Hgb 9.4, venous blood gas pH 7.208, pCo2 65.  She was also found to be + for influenza A.  Pt developed altered mental status and afib with RVR in the 170's.  She was cardioverted to rate in the 50's and became hypotensive in the 86'V systolic.  She was started on levophed and admitted to the ICU.  We were consulted due to the development of AKI/CKD stage IV.  The trend in Scr is seen below.  She states that she is feeling a little better today but still has some SOB with coughing.  She denies any N/V/D, dysuria, pyuria, hematuria, urgency, frequency, or retention.   Trend in Creatinine: Creatinine, Ser  Date/Time Value Ref Range Status  12/02/2022 04:49 AM 4.42 (H) 0.44 - 1.00 mg/dL Final  12/01/2022 08:01 AM 4.44 (H) 0.44 - 1.00 mg/dL Final  11/30/2022 03:20 AM 4.46 (H) 0.44 - 1.00 mg/dL Final  11/30/2022 12:38 AM 4.42 (H) 0.44 - 1.00 mg/dL Final  11/29/2022 01:53 AM 4.04 (H) 0.44 - 1.00 mg/dL Final  11/28/2022 08:07 PM 4.10 (H) 0.44 - 1.00 mg/dL Final  11/28/2022 07:28 PM 4.11 (H) 0.44 - 1.00 mg/dL Final  10/11/2022 11:15 AM 2.20 (H) 0.44 - 1.00 mg/dL Final  10/11/2022 10:45 AM 2.28 (H) 0.44 - 1.00 mg/dL Final  03/22/2022 10:45 PM 2.42 (H) 0.44 - 1.00 mg/dL Final  03/08/2022 02:14 PM 2.20 (H) 0.57 - 1.00 mg/dL Final  02/23/2022 02:19 AM 2.86 (H) 0.44 - 1.00 mg/dL Final  02/22/2022 12:33 AM 3.03 (H) 0.44 - 1.00 mg/dL Final  02/21/2022 02:06 AM 3.01 (H) 0.44 - 1.00 mg/dL Final  02/20/2022 02:38 AM 1.93 (H) 0.44 -  1.00 mg/dL Final  02/19/2022 07:44 PM 2.14 (H) 0.44 - 1.00 mg/dL Final  10/16/2021 02:44 PM 1.57 (H) 0.57 - 1.00 mg/dL Final  07/08/2021 02:38 AM 2.08 (H) 0.44 - 1.00 mg/dL Final  07/07/2021 10:32 AM 1.97 (H) 0.44 - 1.00 mg/dL Final  05/05/2021 10:06 AM 1.64 (H) 0.57 - 1.00 mg/dL Final  03/20/2021 10:39 AM 1.67 (H) 0.57 - 1.00 mg/dL Final  01/05/2021 10:18 AM 2.20 (H) 0.57 - 1.00 mg/dL Final  12/11/2020 11:11 AM 1.95 (H) 0.57 - 1.00 mg/dL Final  10/17/2020 11:38 AM 1.90 (H) 0.57 - 1.00 mg/dL Final  09/09/2020 11:46 AM 2.06 (H) 0.57 - 1.00 mg/dL Final  08/28/2020 09:14 AM 1.87 (H) 0.57 - 1.00 mg/dL Final  08/04/2020 12:18 PM 2.04 (H) 0.57 - 1.00 mg/dL Final  07/23/2020 09:42 AM 2.00 (H) 0.57 - 1.00 mg/dL Final  07/15/2020 07:28 AM 2.00 (H) 0.44 - 1.00 mg/dL Final  07/14/2020 05:09 AM 2.24 (H) 0.44 - 1.00 mg/dL Final  07/13/2020 04:59 AM 2.02 (H) 0.44 - 1.00 mg/dL Final  07/12/2020 02:40 AM 2.05 (H) 0.44 - 1.00 mg/dL Final  07/11/2020 07:53 AM 2.04 (H) 0.44 - 1.00 mg/dL Final  07/10/2020 03:59 AM 2.16 (H) 0.44 - 1.00 mg/dL Final  07/09/2020 12:18 AM 1.95 (H) 0.44 - 1.00 mg/dL Final    PMH:  Past Medical History:  Diagnosis Date   Acute CVA (cerebrovascular accident) ([place]) 07/12/2020   Hypertension    Renal disorder    CKD Stage IV   Stroke [facility])     PSH:   Past Surgical History:  Procedure Laterality Date   CARDIAC CATHETERIZATION  2009    Allergies:  Allergies  Allergen Reactions   Other Other (See Comments)    "Tapes tears off my skin"   Latex Other (See Comments)    "Blisters my skin and tears it off"- skin burns, too   Penicillins Nausea Only   Tape Other (See Comments)    "Tapes tears off my skin"   Aspirin Other (See Comments)    Irritates the stomach   Atorvastatin Other (See Comments)    Caused fatigue   Diltiazem Hcl Nausea And Vomiting    Medications:   Prior to Admission medications   Medication Sig Start Date End Date Taking? Authorizing Provider   apixaban (ELIQUIS) 2.5 MG TABS tablet Take 1 tablet (2.5 mg total) by mouth 2 (two) times daily. 11/11/22  Yes Freeman Caldron M, PA-C  carvedilol (COREG) 25 MG tablet Take 1 tablet (25 mg total) by mouth 2 (two) times daily. 11/11/22 02/09/23 Yes McClung, Dionne Bucy, PA-C  cholecalciferol (VITAMIN D3) 25 MCG (1000 UNIT) tablet Take 1,000 Units by mouth daily. 10/15/22  Yes [provider]  clotrimazole-betamethasone (LOTRISONE) cream Apply topically 2 (two) times daily. 11/11/22 11/11/23 Yes McClung, Dionne Bucy, PA-C  diphenhydrAMINE (BENADRYL) 25 mg capsule Take 25 mg by mouth every 6 (six) hours as needed for itching or allergies. 10/15/22  Yes [provider]  Ferrous Sulfate (IRON PO) Take 1 tablet by mouth daily.   Yes [provider]  furosemide (LASIX) 20 MG tablet Take 3 tablets (60 mg total) by mouth in the morning. 11/11/22  Yes Freeman Caldron M, PA-C  rosuvastatin (CRESTOR) 10 MG tablet Take 1 tablet (10 mg total) by mouth daily. 11/11/22  Yes Freeman Caldron M, PA-C  sennosides-docusate sodium (SENOKOT-S) 8.6-50 MG tablet Take 3 tablets by mouth at bedtime. Patient taking differently: Take 3 tablets by mouth at bedtime as needed for constipation. 10/01/21  Yes [name], MD  VITAMIN D PO Take 1 capsule by mouth every 7 (seven) days.   Yes [provider]  chlorthalidone (HYGROTON) 25 MG tablet Take 12.5 mg by mouth daily. Patient not taking: Reported on 11/29/2022 08/09/22   [provider]  hydrALAZINE (APRESOLINE) 50 MG tablet Take 1 tablet (50 mg total) by mouth 3 (three) times daily. Patient not taking: Reported on 11/29/2022 11/11/22 12/11/22  Argentina Donovan, PA-C    Inpatient medications:  apixaban  2.5 mg Oral BID   arformoterol  15 mcg Nebulization BID   budesonide (PULMICORT) nebulizer solution  0.5 mg Nebulization BID   Chlorhexidine Gluconate Cloth  6 each Topical Q0600   insulin aspart  0-15 Units Subcutaneous Q4H    methylPREDNISolone (SOLU-MEDROL) injection  40 mg Intravenous Q12H   mouth rinse  15 mL Mouth Rinse 4 times per day   oseltamivir  30 mg Oral QODAY   revefenacin  175 mcg Nebulization Daily   sodium chloride flush  3 mL Intravenous Q12H    Discontinued Meds:   Medications Discontinued During This Encounter  Medication Reason   albuterol (PROVENTIL) (2.5 MG/3ML) 0.083% nebulizer solution    norepinephrine (LEVOPHED) 4mg  in 215mL (0.016 mg/mL) premix infusion    desoximetasone (TOPICORT) 0.25 % cream Patient Preference  methylPREDNISolone sodium succinate (SOLU-MEDROL) 125 mg/2 mL injection 80 mg    heparin ADULT infusion 100 units/mL (25000 units/250mL) Dose change   oseltamivir (TAMIFLU) 6 MG/ML suspension 30 mg    methylPREDNISolone sodium succinate (SOLU-MEDROL) 40 mg/mL injection 40 mg    ipratropium-albuterol (DUONEB) 0.5-2.5 (3) MG/3ML nebulizer solution 3 mL    norepinephrine (LEVOPHED) 4mg  in 265mL (0.016 mg/mL) premix infusion    ipratropium-albuterol (DUONEB) 0.5-2.5 (3) MG/3ML nebulizer solution 3 mL    lactated ringers infusion     Social History:  reports that she has never smoked. She has never used smokeless tobacco. No history on file for alcohol use and drug use.  Family History:   Family History  Problem Relation Age of Onset   CAD Mother    Breast cancer Mother     Pertinent items are noted in HPI. Weight change: 0 kg  Intake/Output Summary (Last 24 hours) at 12/02/2022 1307 Last data filed at 12/02/2022 1135 Gross per 24 hour  Intake 372.38 ml  Output 950 ml  Net -577.62 ml   BP (!) 182/66   Pulse (!) 57   Temp 98.1 F (36.7 C) (Oral)   Resp 20   Ht 5\' 1"  (1.549 m)   Wt 88.9 kg   SpO2 100%   BMI 37.03 kg/m  Vitals:   12/02/22 1100 12/02/22 1107 12/02/22 1125 12/02/22 1200  BP: (!) 177/62   (!) 182/66  Pulse: (!) 59  60 (!) 57  Resp: (!) 23  (!) 25 20  Temp:   98.1 F (36.7 C)   TempSrc:   Oral   SpO2: 99% (!) 87% 100% 100%  Weight:       Height:         General appearance: no distress, slowed mentation, and sitting up in a chair Resp: poor inspiratory effort with scattered rhonchi Cardio: regular rate and rhythm, S1, S2 normal, no murmur, click, rub or gallop GI: soft, non-tender; bowel sounds normal; no masses,  no organomegaly Extremities: edema 2+ pitting edema bilateral lower extremities  Labs: Basic Metabolic Panel: Recent Labs  Lab 11/28/22 1928 11/28/22 2007 11/28/22 2149 11/29/22 0153 11/30/22 0038 11/30/22 0320 11/30/22 1018 12/01/22 0801 12/01/22 1015 12/02/22 0449  NA 139 143  143   < > 140 138 138 138 139 138 137  K 4.1 3.8  3.8   < > 4.3 4.6 4.7 4.5 4.7 4.8 4.7  CL 102 104  --  104 101 102  --  103  --  102  CO2 25  --   --  23 26 25   --  25  --  25  GLUCOSE 124* 110*  --  126* 119* 113*  --  100*  --  116*  BUN 53* 49*  --  54* 68* 69*  --  77*  --  81*  CREATININE 4.11* 4.10*  --  4.04* 4.42* 4.46*  --  4.44*  --  4.42*  ALBUMIN 3.0*  --   --  2.7*  --  2.7*  --   --   --  2.4*  CALCIUM 8.2*  --   --  8.1* 7.8* 7.9*  --  8.0*  --  8.2*   < > = values in this interval not displayed.   Liver Function Tests: Recent Labs  Lab 11/29/22 0153 11/30/22 0320 12/02/22 0449  AST 64* 67* 37  ALT 70* 73* 61*  ALKPHOS 136* 134* 113  BILITOT 0.5 0.6 0.5  PROT 6.4*  6.3* 6.0*  ALBUMIN 2.7* 2.7* 2.4*   No results for input(s): "LIPASE", "AMYLASE" in the last 168 hours. Recent Labs  Lab 11/28/22 2353  AMMONIA 19   CBC: Recent Labs  Lab 11/29/22 0153 11/30/22 0320 11/30/22 1018 12/01/22 0801 12/01/22 1015 12/02/22 0449  WBC 6.3 7.7  --  7.7  --  8.2  HGB 9.0* 9.2* 9.9* 9.3* 9.9* 9.0*  HCT 31.3* 32.2* 29.0* 30.9* 29.0* 29.1*  MCV 95.7 93.3  --  90.9  --  88.2  PLT 135* 142*  --  144*  --  140*   PT/INR: @LABRCNTIP (inr:5) Cardiac Enzymes: )No results for input(s): "CKTOTAL", "CKMB", "CKMBINDEX", "TROPONINI" in the last 168 hours. CBG: Recent Labs  Lab 12/01/22 1921 12/01/22 2328  12/02/22 0319 12/02/22 0751 12/02/22 1124  GLUCAP 164* 129* 117* 111* 210*    Iron Studies: No results for input(s): "IRON", "TIBC", "TRANSFERRIN", "FERRITIN" in the last 168 hours.  Xrays/Other Studies: DG Chest Port 1 View  Result Date: 12/02/2022 CLINICAL DATA:  Acute respiratory failure EXAM: PORTABLE CHEST 1 VIEW COMPARISON:  Chest x-ray dated November 30, 2022 FINDINGS: Unchanged cardiomegaly. Mild diffuse interstitial opacities, increased when compared with the prior exam. Small bilateral pleural effusions, similar to prior. No evidence of pneumothorax. IMPRESSION: Mild diffuse interstitial opacities, increased when compared with the prior exam, likely due to worsened pulmonary edema. Electronically Signed   By: Yetta Glassman M.D.   On: 12/02/2022 08:15     Assessment/Plan:  AKI/CKD stage IV - likely ischemic ATN following A fib with RVR and hypotension requiring pressors as well as acute on chronic diastolic CHF.  Pt was given IV lasix 120 mg initially but then held after she developed AKI.  No uremic symptoms.  UOP has dropped off.  Pt is a poor candidate for dialysis given her advanced age, multiple co-morbidities (including cognitive impairment), and poor functional and nutritional status.  Will start IV lasix and follow response.  If she doesn't would recommend palliative care consult to help set goals/limits of care.   Acute respiratory failure with hypercarbia in setting of influenza A - was on BiPAP as needed.  On solumedrol, brovana, pulmicort and Yuperli per PCCM Influenza A - tamiflu for 5 days A fib with RVR s/p cardioversion - on Eliquis Acute on chronic diastolic CHF - likely due to A fib with RVR and AKI.   Acute encephalopathy - due to hyeprcarbia.  Improving. Elevated LFT's - likely shock liver vs congestion.  Anemia of CKD stage IV - transfuse prn. Disposition - overall prognosis is poor and not a candidate for dialysis.  Recommend Palliative care consult to help  set goals/limits of care.  Pt does have underlying cognitive impairment.   Broadus John A Brookelynne Dimperio 12/02/2022, 1:07 PM

## 2022-12-02 NOTE — Progress Notes (Signed)
PROGRESS NOTE    Jillian [name]  VXY:[id number] DOB: September 28, 1936 DOA: 11/28/2022 PCP: [name], MD    Brief Narrative:  87 year old with history of CKD stage IV, chronic diastolic heart failure, stroke, hypertension, paroxysmal A-fib on Eliquis presented to the emergency room from home on 1/14 with respiratory distress.  In the emergency room she was tachypneic but afebrile.  Initially placed on BiPAP and treated with bronchodilators and steroids.  Chest x-ray consistent with fluid overload.  She was also found with rapid A-fib, she was cardioverted and rate was controlled.  Subsequently after cardioversion she went into hypotensive episode and was started with peripheral Levophed and transferred to ICU on BiPAP and peripheral Levophed.  1/14 admitted to [facility] resp distress on bipap; afib rvr cardioverted then hypotensive and bradycardic placed on levo 1/15 Started Tamiflu for positive flu PCR.  Did not respond to IV Lasix 1/16 started IV fluids for AKI 1/18, overnight with acute respiratory distress relieved with BiPAP.  Assessment & Plan:   Acute hypercapnic respiratory failure: In the setting of influenza A infection and fluid overload. Aggressive bronchodilator therapy, IV steroids, inhalational steroids, scheduled and as needed bronchodilators, deep breathing exercises, incentive spirometry, chest physiotherapy. Tamiflu, renally dosed for 5 days. Supplemental oxygen to keep saturations more than 90%. BiPAP as needed for respiratory distress.  Acute kidney injury on chronic kidney disease stage IV: Known baseline creatinine of 2.2.  Worsening renal functions.  Foley catheter in place.  Urine output reported 675 mL last 24 hours. Though she is making urine, still has evidence of fluid overload, may be heading towards hemodialysis and this likely not possible in her case with frailty and debility. Called and discussed with nephrology for consultation.  If no adequate improvement in  renal functions, will benefit with palliative care discussions.  A-fib with RVR: Status post cardioversion and developed bradycardia.  Therapeutic on Eliquis.  Heart rate well-controlled now.  Acute metabolic encephalopathy due to hypercapnia: Mental status gradually normalizing.  Is still impulsiveness and confusion.  Chronic anemia and thrombocytopenia: Stable.  Start mobilizing with PT OT.  Nephrology consulted.  Probable skilled nursing facility placement if renal functions normalized.   DVT prophylaxis: apixaban (ELIQUIS) tablet 2.5 mg Start: 12/01/22 1015 apixaban (ELIQUIS) tablet 2.5 mg   Code Status: Full code Family Communication: Daughter Sharyn Lull at the bedside, granddaughter [name] on the phone. Disposition Plan: Status is: Inpatient Remains inpatient appropriate because: Respiratory distress, renal functions abnormality.     Consultants:  Critical care Nephrology  Procedures:  Cardioversion  Antimicrobials:  None   Subjective: Patient examined in the morning rounds.  Daughter was at the bedside.  Patient is pleasant and slightly impulsive.  Alert and awake.  Intermittently confused.  Overnight events noted.  She used BiPAP all night with restful night.  Currently on 4 L oxygen.  Remains afebrile.  Objective: Vitals:   12/02/22 0400 12/02/22 0500 12/02/22 0600 12/02/22 0748  BP: (!) 153/61 (!) 147/62 (!) 152/61   Pulse: (!) 53 (!) 51 (!) 51   Resp: 19 14 15    Temp: 98.2 F (36.8 C) 98.4 F (36.9 C) 98.4 F (36.9 C) 98.4 F (36.9 C)  TempSrc:    Bladder  SpO2: 100% 100% 100%   Weight:      Height:        Intake/Output Summary (Last 24 hours) at 12/02/2022 0753 Last data filed at 12/02/2022 0500 Gross per 24 hour  Intake 610.49 ml  Output 675 ml  Net -64.51 ml  Filed Weights   11/29/22 0500 12/01/22 0500 12/02/22 0344  Weight: 86.1 kg 88.9 kg 88.9 kg    Examination:  General exam: Appears sick looking.  Frail and debilitated.   Tremulous. Respiratory system: Not in any respiratory distress.  On 4 L oxygen.  Mostly conducted upper airway sounds.  She does have coarse crackles on the posterior lung base. Cardiovascular system: S1 & S2 heard, RRR.  Bilateral pedal edema.  Left more than right.  Gastrointestinal system: Abdomen is nondistended, soft and nontender. No organomegaly or masses felt. Normal bowel sounds heard. Central nervous system: Alert and awake but not oriented.  Moves all extremities.   Data Reviewed: I have personally reviewed following labs and imaging studies  CBC: Recent Labs  Lab 11/28/22 1928 11/28/22 2007 11/29/22 0153 11/30/22 0320 11/30/22 1018 12/01/22 0801 12/01/22 1015 12/02/22 0449  WBC 7.1  --  6.3 7.7  --  7.7  --  8.2  HGB 9.4*   < > 9.0* 9.2* 9.9* 9.3* 9.9* 9.0*  HCT 33.0*   < > 31.3* 32.2* 29.0* 30.9* 29.0* 29.1*  MCV 93.8  --  95.7 93.3  --  90.9  --  88.2  PLT 137*  --  135* 142*  --  144*  --  140*   < > = values in this interval not displayed.   Basic Metabolic Panel: Recent Labs  Lab 11/28/22 2351 11/29/22 0153 11/30/22 0038 11/30/22 0320 11/30/22 1018 12/01/22 0801 12/01/22 1015 12/02/22 0449  NA  --  140 138 138 138 139 138 137  K  --  4.3 4.6 4.7 4.5 4.7 4.8 4.7  CL  --  104 101 102  --  103  --  102  CO2  --  23 26 25   --  25  --  25  GLUCOSE  --  126* 119* 113*  --  100*  --  116*  BUN  --  54* 68* 69*  --  77*  --  81*  CREATININE  --  4.04* 4.42* 4.46*  --  4.44*  --  4.42*  CALCIUM  --  8.1* 7.8* 7.9*  --  8.0*  --  8.2*  MG 2.3  --   --   --   --   --   --  2.8*   GFR: Estimated Creatinine Clearance: 9.6 mL/min (A) (by C-G formula based on SCr of 4.42 mg/dL (H)). Liver Function Tests: Recent Labs  Lab 11/28/22 1928 11/29/22 0153 11/30/22 0320 12/02/22 0449  AST 71* 64* 67* 37  ALT 74* 70* 73* 61*  ALKPHOS 151* 136* 134* 113  BILITOT 0.7 0.5 0.6 0.5  PROT 7.1 6.4* 6.3* 6.0*  ALBUMIN 3.0* 2.7* 2.7* 2.4*   No results for input(s):  "LIPASE", "AMYLASE" in the last 168 hours. Recent Labs  Lab 11/28/22 2353  AMMONIA 19   Coagulation Profile: Recent Labs  Lab 11/28/22 2351  INR 1.5*   Cardiac Enzymes: No results for input(s): "CKTOTAL", "CKMB", "CKMBINDEX", "TROPONINI" in the last 168 hours. BNP (last 3 results) No results for input(s): "PROBNP" in the last 8760 hours. HbA1C: Recent Labs    11/29/22 1048  HGBA1C 5.9*   CBG: Recent Labs  Lab 12/01/22 1122 12/01/22 1527 12/01/22 1921 12/01/22 2328 12/02/22 0319  GLUCAP 145* 187* 164* 129* 117*   Lipid Profile: No results for input(s): "CHOL", "HDL", "LDLCALC", "TRIG", "CHOLHDL", "LDLDIRECT" in the last 72 hours. Thyroid Function Tests: No results for input(s): "TSH", "T4TOTAL", "FREET4", "T3FREE", "  THYROIDAB" in the last 72 hours. Anemia Panel: No results for input(s): "VITAMINB12", "FOLATE", "FERRITIN", "TIBC", "IRON", "RETICCTPCT" in the last 72 hours. Sepsis Labs: No results for input(s): "PROCALCITON", "LATICACIDVEN" in the last 168 hours.  Recent Results (from the past 240 hour(s))  MRSA Next Gen by PCR, Nasal     Status: None   Collection Time: 11/29/22 12:22 AM   Specimen: Nasal Mucosa; Nasal Swab  Result Value Ref Range Status   MRSA by PCR Next Gen NOT DETECTED NOT DETECTED Final    Comment: (NOTE) The GeneXpert MRSA Assay (FDA approved for NASAL specimens only), is one component of a comprehensive MRSA colonization surveillance program. It is not intended to diagnose MRSA infection nor to guide or monitor treatment for MRSA infections. Test performance is not FDA approved in patients less than 19 years old. Performed at [facility], [place] [address], [place], [place] [address]   Resp panel by RT-PCR (RSV, Flu A&B, Covid) Anterior Nasal Swab     Status: Abnormal   Collection Time: 11/29/22 12:02 PM   Specimen: Anterior Nasal Swab  Result Value Ref Range Status   SARS Coronavirus 2 by RT PCR NEGATIVE NEGATIVE Final    Comment:  (NOTE) SARS-CoV-2 target nucleic acids are NOT DETECTED.  The SARS-CoV-2 RNA is generally detectable in upper respiratory specimens during the acute phase of infection. The lowest concentration of SARS-CoV-2 viral copies this assay can detect is 138 copies/mL. A negative result does not preclude SARS-Cov-2 infection and should not be used as the sole basis for treatment or other patient management decisions. A negative result may occur with  improper specimen collection/handling, submission of specimen other than nasopharyngeal swab, presence of viral mutation(s) within the areas targeted by this assay, and inadequate number of viral copies(<138 copies/mL). A negative result must be combined with clinical observations, patient history, and epidemiological information. The expected result is Negative.  Fact Sheet for Patients:  EntrepreneurPulse.com.au  Fact Sheet for Healthcare Providers:  IncredibleEmployment.be  This test is no t yet approved or cleared by the [place] FDA and  has been authorized for detection and/or diagnosis of SARS-CoV-2 by FDA under an Emergency Use Authorization (EUA). This EUA will remain  in effect (meaning this test can be used) for the duration of the COVID-19 declaration under Section 564(b)(1) of the Act, 21 U.S.C.section 360bbb-3(b)(1), unless the authorization is terminated  or revoked sooner.       Influenza A by PCR POSITIVE (A) NEGATIVE Final   Influenza B by PCR NEGATIVE NEGATIVE Final    Comment: (NOTE) The Xpert Xpress SARS-CoV-2/FLU/RSV plus assay is intended as an aid in the diagnosis of influenza from Nasopharyngeal swab specimens and should not be used as a sole basis for treatment. Nasal washings and aspirates are unacceptable for Xpert Xpress SARS-CoV-2/FLU/RSV testing.  Fact Sheet for Patients: EntrepreneurPulse.com.au  Fact Sheet for Healthcare  Providers: IncredibleEmployment.be  This test is not yet approved or cleared by the [place] FDA and has been authorized for detection and/or diagnosis of SARS-CoV-2 by FDA under an Emergency Use Authorization (EUA). This EUA will remain in effect (meaning this test can be used) for the duration of the COVID-19 declaration under Section 564(b)(1) of the Act, 21 U.S.C. section 360bbb-3(b)(1), unless the authorization is terminated or revoked.     Resp Syncytial Virus by PCR NEGATIVE NEGATIVE Final    Comment: (NOTE) Fact Sheet for Patients: EntrepreneurPulse.com.au  Fact Sheet for Healthcare Providers: IncredibleEmployment.be  This test is  not yet approved or cleared by the [place] and has been authorized for detection and/or diagnosis of SARS-CoV-2 by FDA under an Emergency Use Authorization (EUA). This EUA will remain in effect (meaning this test can be used) for the duration of the COVID-19 declaration under Section 564(b)(1) of the Act, 21 U.S.C. section 360bbb-3(b)(1), unless the authorization is terminated or revoked.  Performed at [facility], [place] [address] [place], [place] [address]          Radiology Studies: DG Chest Port 1 View  Result Date: 11/30/2022 CLINICAL DATA:  5626 Acute respiratory failure ([place]) 5626 EXAM: PORTABLE CHEST 1 VIEW COMPARISON:  11/28/2022 chest radiograph. FINDINGS: Stable cardiomediastinal silhouette with moderate cardiomegaly. No pneumothorax. No pleural effusion. Streaky bibasilar lung opacities, slightly worsened, favor atelectasis. Mild-to-moderate elevation of the left hemidiaphragm, similar. No overt pulmonary edema. IMPRESSION: 1. Stable moderate cardiomegaly without overt pulmonary edema. 2. Streaky bibasilar lung opacities, slightly worsened, favor atelectasis. 3. Stable mild-to-moderate elevation of the left hemidiaphragm. Electronically Signed   By: Ilona Sorrel M.D.   On: 11/30/2022 08:50        Scheduled Meds:  apixaban  2.5 mg Oral BID   arformoterol  15 mcg Nebulization BID   budesonide (PULMICORT) nebulizer solution  0.5 mg Nebulization BID   Chlorhexidine Gluconate Cloth  6 each Topical Q0600   insulin aspart  0-15 Units Subcutaneous Q4H   methylPREDNISolone (SOLU-MEDROL) injection  40 mg Intravenous Q12H   mouth rinse  15 mL Mouth Rinse 4 times per day   oseltamivir  30 mg Oral QODAY   revefenacin  175 mcg Nebulization Daily   sodium chloride flush  3 mL Intravenous Q12H   Continuous Infusions:  sodium chloride Stopped (12/01/22 1343)     LOS: 4 days    Time spent: 50 minutes    Barb Merino, MD [facility] Pager [phone]

## 2022-12-02 NOTE — Progress Notes (Signed)
Speech Language Pathology Treatment: Dysphagia  Patient Details Name: Jillian [name] MRN: [id number] DOB: July 19, 1936 Today's Date: 12/02/2022 Time: 6314-9702 SLP Time Calculation (min) (ACUTE ONLY): 15 min  Assessment / Plan / Recommendation Clinical Impression  Patient seen by SLP for skilled treatment focused on dysphagia goals. Of note, she appeared more attentive, more interactive and less confused than she has in past sessions. She was oriented to being at "[facility]" and corrected herself when at first stating year as "2021" then shaking her head and saying "2024". RN said she didn't have any coughing with breakfast (yogurt, juice, water). Her SpO2 was at 100% and RR 22 and SLP did not observe any wheezing as she had been exhibiting in previous sessions. SLP assessed her toleration of thin liquids (water) and dys 2 (minced) solids (Rice Krispies). She was able to feed self without difficulty and no observed difficulty during oral phase, no overt s/s aspiration or penetration observed. SLP recommending upgrade patient's diet from full liquids to Dys 2 (minced), thin liquids. SLP will continue to follow for toleration.   HPI HPI: Patient is an 87 y.o. female with PMH: CKD stage 4, CVA, HTN, afib. She presented to the hospital on 11/28/22 secondary to acute respiratory distress with RR 24. She was afebrile. Placed on BiPAP and given breathing treatments, lasix and started on steroids. CXR suggestive of CHF. She was weaned off of BiPAP 11/30/22 to 3L supplemental via nasal cannula.      SLP Plan  Continue with current plan of care      Recommendations for follow up therapy are one component of a multi-disciplinary discharge planning process, led by the attending physician.  Recommendations may be updated based on patient status, additional functional criteria and insurance authorization.    Recommendations  Diet recommendations: Thin liquid;Dysphagia 2 (fine chop) Liquids provided via:  Cup;Straw Medication Administration: Whole meds with puree Supervision: Patient able to self feed;Full supervision/cueing for compensatory strategies Compensations: Slow rate;Small sips/bites Postural Changes and/or Swallow Maneuvers: Seated upright 90 degrees                Oral Care Recommendations: Oral care BID Follow Up Recommendations: Follow physician's recommendations for discharge plan and follow up therapies Assistance recommended at discharge: Frequent or constant Supervision/Assistance SLP Visit Diagnosis: Dysphagia, unspecified (R13.10) Plan: Continue with current plan of care           Jillian Baller, MA, CCC-SLP Speech Therapy

## 2022-12-02 NOTE — Progress Notes (Signed)
Dr Sloan Leiter notified of increasing WOB, inc tachypnea. Lasix ordered and prn Bipap. Will monitor closely.Also notified of increased BP. Coreg ordered and started.

## 2022-12-02 NOTE — Evaluation (Signed)
Physical Therapy Evaluation Patient Details Name: Jillian [name] MRN: [id number] DOB: 03-Nov-1936 Today's Date: 12/02/2022  History of Present Illness  87 yo female admitted 1/14 with respiratory failure, Afib with RVR and flu (+). Cardioversion 1/14. PMhx; CKD, CHF, CVA, HTN, Afib  Clinical Impression  Pt pleasantly confused and able to tolerate limited mobility with reliance on RW. Pt with decreased strength, cognition, mobility and safety who will benefit from acute therapy to maximize mobility and independence prior to return home.   SpO2 99% on 3L at rest, able to maintain 97% on 1L at rest Required 3L with gait to maintain 90% with limited distance       Recommendations for follow up therapy are one component of a multi-disciplinary discharge planning process, led by the attending physician.  Recommendations may be updated based on patient status, additional functional criteria and insurance authorization.  Follow Up Recommendations Home health PT      Assistance Recommended at Discharge Frequent or constant Supervision/Assistance  Patient can return home with the following  A little help with walking and/or transfers;A little help with bathing/dressing/bathroom;Assistance with cooking/housework;Assist for transportation;Help with stairs or ramp for entrance    Equipment Recommendations None recommended by PT  Recommendations for Other Services  OT consult    Functional Status Assessment Patient has had a recent decline in their functional status and demonstrates the ability to make significant improvements in function in a reasonable and predictable amount of time.     Precautions / Restrictions Precautions Precautions: Fall;Other (comment) Precaution Comments: watch sats      Mobility  Bed Mobility Overal bed mobility: Needs Assistance Bed Mobility: Supine to Sit     Supine to sit: HOB elevated, Min guard     General bed mobility comments: guarding with HOB 35  degrees    Transfers Overall transfer level: Needs assistance   Transfers: Sit to/from Stand Sit to Stand: Min guard           General transfer comment: guarding with cues for safety, assist for lines and RW present    Ambulation/Gait Ambulation/Gait assistance: Min assist Gait Distance (Feet): 24 Feet Assistive device: Rolling walker (2 wheels) Gait Pattern/deviations: Step-through pattern, Decreased stride length, Trunk flexed   Gait velocity interpretation: 1.31 - 2.62 ft/sec, indicative of limited community ambulator   General Gait Details: cues for posture, proximity to RW and direction, pt running into objects x 2, limited by fatigue with desaturation to 87% on 2L with gait, 90% on 3L.  Stairs            Wheelchair Mobility    Modified Rankin (Stroke Patients Only)       Balance Overall balance assessment: Needs assistance Sitting-balance support: No upper extremity supported, Feet supported Sitting balance-Leahy Scale: Fair     Standing balance support: Bilateral upper extremity supported, Reliant on assistive device for balance Standing balance-Leahy Scale: Poor Standing balance comment: RW for standing                             Pertinent Vitals/Pain Pain Assessment Pain Assessment: No/denies pain    Home Living Family/patient expects to be discharged to:: Private residence Living Arrangements: Other relatives;Children Available Help at Discharge: Family;Available PRN/intermittently Type of Home: House Home Access: Stairs to enter   Entrance Stairs-Number of Steps: 1   Home Layout: One level Home Equipment: Rollator (4 wheels);Shower seat;BSC/3in1      Prior Function Prior Level of Function :  Independent/Modified Independent             Mobility Comments: Does not use DME. "I have all those things but done use them" ADLs Comments: Performs ADLs and IADLs. Care for her 28 year old grandson     Hand Dominance         Extremity/Trunk Assessment   Upper Extremity Assessment Upper Extremity Assessment: Generalized weakness    Lower Extremity Assessment Lower Extremity Assessment: Generalized weakness    Cervical / Trunk Assessment Cervical / Trunk Assessment: Kyphotic  Communication   Communication: No difficulties  Cognition Arousal/Alertness: Awake/alert Behavior During Therapy: Flat affect Overall Cognitive Status: Impaired/Different from baseline Area of Impairment: Orientation, Attention, Memory, Following commands, Safety/judgement, Problem solving                 Orientation Level: Disoriented to, Time Current Attention Level: Sustained Memory: Decreased short-term memory Following Commands: Follows one step commands inconsistently, Follows one step commands with increased time Safety/Judgement: Decreased awareness of safety, Decreased awareness of deficits   Problem Solving: Slow processing General Comments: pt able to state month and year not day, slow processing with decreased awareness of strength, consistently forgetting about catheter and running into objects        General Comments      Exercises     Assessment/Plan    PT Assessment Patient needs continued PT services  PT Problem List Decreased strength;Decreased mobility;Decreased activity tolerance;Decreased balance;Decreased knowledge of use of DME;Cardiopulmonary status limiting activity;Decreased cognition       PT Treatment Interventions Gait training;Therapeutic exercise;Patient/family education;DME instruction;Therapeutic activities;Cognitive remediation;Stair training;Balance training;Functional mobility training    PT Goals (Current goals can be found in the Care Plan section)  Acute Rehab PT Goals Patient Stated Goal: return home PT Goal Formulation: With patient Time For Goal Achievement: 12/16/22 Potential to Achieve Goals: Good    Frequency Min 3X/week     Co-evaluation                AM-PAC PT "6 Clicks" Mobility  Outcome Measure Help needed turning from your back to your side while in a flat bed without using bedrails?: A Little Help needed moving from lying on your back to sitting on the side of a flat bed without using bedrails?: A Little Help needed moving to and from a bed to a chair (including a wheelchair)?: A Little Help needed standing up from a chair using your arms (e.g., wheelchair or bedside chair)?: A Little Help needed to walk in hospital room?: A Lot Help needed climbing 3-5 steps with a railing? : Total 6 Click Score: 15    End of Session Equipment Utilized During Treatment: Gait belt;Oxygen Activity Tolerance: Patient tolerated treatment well Patient left: in chair;with call bell/phone within reach;with chair alarm set Nurse Communication: Mobility status PT Visit Diagnosis: Other abnormalities of gait and mobility (R26.89);Muscle weakness (generalized) (M62.81)    Time: 2774-1287 PT Time Calculation (min) (ACUTE ONLY): 20 min   Charges:   PT Evaluation $PT Eval Moderate Complexity: 1 Mod          Peter Keyworth P, PT Acute Rehabilitation Services Office: [phone]   Sandy Salaam Francetta Ilg 12/02/2022, 11:16 AM

## 2022-12-02 NOTE — Progress Notes (Signed)
Pt placed on Bipap due to increased WOB. Pt tolerating well

## 2022-12-03 DIAGNOSIS — J9602 Acute respiratory failure with hypercapnia: Secondary | ICD-10-CM | POA: Diagnosis not present

## 2022-12-03 LAB — RENAL FUNCTION PANEL
Albumin: 2.4 g/dL — ABNORMAL LOW (ref 3.5–5.0)
Anion gap: 8 (ref 5–15)
BUN: 85 mg/dL — ABNORMAL HIGH (ref 8–23)
CO2: 28 mmol/L (ref 22–32)
Calcium: 8.3 mg/dL — ABNORMAL LOW (ref 8.9–10.3)
Chloride: 102 mmol/L (ref 98–111)
Creatinine, Ser: 3.96 mg/dL — ABNORMAL HIGH (ref 0.44–1.00)
GFR, Estimated: 11 mL/min — ABNORMAL LOW (ref >60)
Glucose, Bld: 105 mg/dL — ABNORMAL HIGH (ref 70–99)
Phosphorus: 4.2 mg/dL (ref 2.5–4.6)
Potassium: 4.5 mmol/L (ref 3.5–5.1)
Sodium: 138 mmol/L (ref 135–145)

## 2022-12-03 LAB — GLUCOSE, CAPILLARY
Glucose-Capillary: 113 mg/dL — ABNORMAL HIGH (ref 70–99)
Glucose-Capillary: 96 mg/dL (ref 70–99)
Glucose-Capillary: 117 mg/dL — ABNORMAL HIGH (ref 70–99)
Glucose-Capillary: 160 mg/dL — ABNORMAL HIGH (ref 70–99)
Glucose-Capillary: 158 mg/dL — ABNORMAL HIGH (ref 70–99)
Glucose-Capillary: 136 mg/dL — ABNORMAL HIGH (ref 70–99)

## 2022-12-03 LAB — CBC WITH DIFFERENTIAL/PLATELET
Abs Immature Granulocytes: 0.13 10*3/uL — ABNORMAL HIGH (ref 0.00–0.07)
Basophils Absolute: 0 10*3/uL (ref 0.0–0.1)
Basophils Relative: 0 %
Eosinophils Absolute: 0 10*3/uL (ref 0.0–0.5)
Eosinophils Relative: 0 %
HCT: 29.8 % — ABNORMAL LOW (ref 36.0–46.0)
Hemoglobin: 9.4 g/dL — ABNORMAL LOW (ref 12.0–15.0)
Immature Granulocytes: 2 %
Lymphocytes Relative: 11 %
Lymphs Abs: 0.9 10*3/uL (ref 0.7–4.0)
MCH: 27.4 pg (ref 26.0–34.0)
MCHC: 31.5 g/dL (ref 30.0–36.0)
MCV: 86.9 fL (ref 80.0–100.0)
Monocytes Absolute: 0.6 10*3/uL (ref 0.1–1.0)
Monocytes Relative: 7 %
Neutro Abs: 7.1 10*3/uL (ref 1.7–7.7)
Neutrophils Relative %: 80 %
Platelets: 137 10*3/uL — ABNORMAL LOW (ref 150–400)
RBC: 3.43 MIL/uL — ABNORMAL LOW (ref 3.87–5.11)
RDW: 15.7 % — ABNORMAL HIGH (ref 11.5–15.5)
WBC: 8.8 10*3/uL (ref 4.0–10.5)
nRBC: 0.2 % (ref 0.0–0.2)

## 2022-12-03 LAB — MAGNESIUM: Magnesium: 2.7 mg/dL — ABNORMAL HIGH (ref 1.7–2.4)

## 2022-12-03 MED ORDER — HYDRALAZINE HCL 50 MG PO TABS
25.0000 mg | ORAL_TABLET | Freq: Three times a day (TID) | ORAL | Status: DC
  Administered 2022-12-03: 25 mg via ORAL
  Filled 2022-12-03: qty 1

## 2022-12-03 MED ORDER — HYDRALAZINE HCL 50 MG PO TABS
50.0000 mg | ORAL_TABLET | Freq: Three times a day (TID) | ORAL | Status: DC
  Administered 2022-12-03 – 2022-12-04 (×3): 50 mg via ORAL
  Filled 2022-12-03 (×3): qty 1

## 2022-12-03 MED ORDER — CARVEDILOL 12.5 MG PO TABS: 12.5000 mg | ORAL_TABLET | Freq: Two times a day (BID) | ORAL | Status: DC

## 2022-12-03 MED ORDER — FUROSEMIDE 10 MG/ML IJ SOLN
60.0000 mg | Freq: Every day | INTRAMUSCULAR | Status: DC
  Administered 2022-12-03 – 2022-12-05 (×3): 60 mg via INTRAVENOUS
  Filled 2022-12-03 (×3): qty 6

## 2022-12-03 MED ORDER — POLYETHYLENE GLYCOL 3350 17 G PO PACK
17.0000 g | PACK | Freq: Every day | ORAL | Status: DC
  Administered 2022-12-03 – 2022-12-08 (×6): 17 g via ORAL
  Filled 2022-12-03 (×6): qty 1

## 2022-12-03 NOTE — Progress Notes (Signed)
PROGRESS NOTE    Jillian [name]  PFX:[id number] DOB: 05-15-1936 DOA: 11/28/2022 PCP: [name], MD    Brief Narrative:  87 year old with history of CKD stage IV, chronic diastolic heart failure, stroke, hypertension, paroxysmal A-fib on Eliquis presented to the emergency room from home on 1/14 with respiratory distress.  In the emergency room she was tachypneic but afebrile.  Initially placed on BiPAP and treated with bronchodilators and steroids.  Chest x-ray consistent with fluid overload.  She was also found with rapid A-fib, she was cardioverted and rate was controlled.  Subsequently after cardioversion she went into hypotensive episode and was started with peripheral Levophed and transferred to ICU on BiPAP and peripheral Levophed.  1/14 admitted to [facility] resp distress on bipap; afib rvr cardioverted then hypotensive and bradycardic placed on levo 1/15 Started Tamiflu for positive flu PCR.  Did not respond to IV Lasix 1/16 started IV fluids for AKI 1/18, intermittently short of breath with pulmonary edema on chest x-ray.  Using BiPAP as needed.  Nephrology consulted.  Assessment & Plan:   Acute hypercapnic respiratory failure: In the setting of influenza A infection and fluid overload. Continue bronchodilator therapy, IV steroids, inhalational steroids, scheduled and as needed bronchodilators, deep breathing exercises, incentive spirometry, chest physiotherapy. Tamiflu, renally dosed for 5 days. Supplemental oxygen to keep saturations more than 90%. BiPAP as needed for respiratory distress.  Out of bed.  PT OT today.  Acute kidney injury on chronic kidney disease stage IV: Known baseline creatinine of 2.2.  Worsening renal functions. Urine output reported 2055 ml last 24 hours after dose of Lasix 60 mg IV once. Though she is making urine, still has evidence of fluid overload, may be heading towards hemodialysis and this likely not possible in her case with frailty and  debility. Followed by nephrology.  Will defer to diuretic dosing.  Labs pending from today.  A-fib with RVR: Status post cardioversion and developed bradycardia.  Therapeutic on Eliquis.  Heart rate well-controlled now. Tends to be bradycardic.  Discontinue carvedilol.  Will start on hydralazine for blood pressure.  Acute metabolic encephalopathy due to hypercapnia: Mental status gradually normalizing.  More awake today.  Chronic anemia and thrombocytopenia: Stable.  Start mobilizing with PT OT.  Probable skilled nursing facility placement if renal functions normalize otherwise patient may need palliation and hospice. This was communicated with patient's granddaughter who is her next of kin and decision maker.  Family is aware about critical nature of her renal disease.   DVT prophylaxis: apixaban (ELIQUIS) tablet 2.5 mg Start: 12/01/22 1015 apixaban (ELIQUIS) tablet 2.5 mg   Code Status: Full code Family Communication: Gary Daughter [name] on the phone.  Disposition Plan: Status is: Inpatient Remains inpatient appropriate because: Respiratory distress, renal functions abnormality.     Consultants:  Critical care Nephrology  Procedures:  Cardioversion  Antimicrobials:  None   Subjective:  Patient seen and examined.  She is more alert and awake today.  Denies any complaints.  Looks sick.  Intermittently on BiPAP.  She had episode of severe respiratory distress with tachypnea last night, given 60 mg of IV Lasix and started on BiPAP with some clinical response. Renal functions pending from today.  Objective: Vitals:   12/03/22 0739 12/03/22 0741 12/03/22 0800 12/03/22 0803  BP:  (!) 165/62 (!) 182/71 (!) 178/67  Pulse:  (!) 52 (!) 57 (!) 59  Resp:  14 (!) 22   Temp: (!) 97.3 F (36.3 C)     TempSrc: Axillary  SpO2:  98% 100%   Weight:      Height:        Intake/Output Summary (Last 24 hours) at 12/03/2022 1024 Last data filed at 12/03/2022 0900 Gross per 24  hour  Intake 310 ml  Output 1880 ml  Net -1570 ml    Filed Weights   12/01/22 0500 12/02/22 0344 12/03/22 0500  Weight: 88.9 kg 88.9 kg 87.7 kg    Examination:  General exam: Sick looking.  Frail and debilitated.  In mild distress on talking.  Was on 2 to 3 L of oxygen. Respiratory system: Mostly conducted upper airway sounds.  She does have coarse crackles on the posterior lung base. SpO2: 100 % O2 Flow Rate (L/min): 2 L/min FiO2 (%): 30 %  Cardiovascular system: S1 & S2 heard, RRR.  Bilateral pedal edema.  Left more than right.  Gastrointestinal system: Abdomen is nondistended, soft and nontender. No organomegaly or masses felt. Normal bowel sounds heard.  Foley catheter is out.  Pure wick with clear urine. Central nervous system: Alert and awake.  Lethargic.  Follows simple commands. Moves all extremities.   Data Reviewed: I have personally reviewed following labs and imaging studies  CBC: Recent Labs  Lab 11/28/22 1928 11/28/22 2007 11/29/22 0153 11/30/22 0320 11/30/22 1018 12/01/22 0801 12/01/22 1015 12/02/22 0449  WBC 7.1  --  6.3 7.7  --  7.7  --  8.2  HGB 9.4*   < > 9.0* 9.2* 9.9* 9.3* 9.9* 9.0*  HCT 33.0*   < > 31.3* 32.2* 29.0* 30.9* 29.0* 29.1*  MCV 93.8  --  95.7 93.3  --  90.9  --  88.2  PLT 137*  --  135* 142*  --  144*  --  140*   < > = values in this interval not displayed.    Basic Metabolic Panel: Recent Labs  Lab 11/28/22 2351 11/29/22 0153 11/30/22 0038 11/30/22 0320 11/30/22 1018 12/01/22 0801 12/01/22 1015 12/02/22 0449  NA  --  140 138 138 138 139 138 137  K  --  4.3 4.6 4.7 4.5 4.7 4.8 4.7  CL  --  104 101 102  --  103  --  102  CO2  --  23 26 25   --  25  --  25  GLUCOSE  --  126* 119* 113*  --  100*  --  116*  BUN  --  54* 68* 69*  --  77*  --  81*  CREATININE  --  4.04* 4.42* 4.46*  --  4.44*  --  4.42*  CALCIUM  --  8.1* 7.8* 7.9*  --  8.0*  --  8.2*  MG 2.3  --   --   --   --   --   --  2.8*    GFR: Estimated Creatinine  Clearance: 9.5 mL/min (A) (by C-G formula based on SCr of 4.42 mg/dL (H)). Liver Function Tests: Recent Labs  Lab 11/28/22 1928 11/29/22 0153 11/30/22 0320 12/02/22 0449  AST 71* 64* 67* 37  ALT 74* 70* 73* 61*  ALKPHOS 151* 136* 134* 113  BILITOT 0.7 0.5 0.6 0.5  PROT 7.1 6.4* 6.3* 6.0*  ALBUMIN 3.0* 2.7* 2.7* 2.4*    No results for input(s): "LIPASE", "AMYLASE" in the last 168 hours. Recent Labs  Lab 11/28/22 2353  AMMONIA 19    Coagulation Profile: Recent Labs  Lab 11/28/22 2351  INR 1.5*    Cardiac Enzymes: No results for input(s): "CKTOTAL", "CKMB", "  CKMBINDEX", "TROPONINI" in the last 168 hours. BNP (last 3 results) No results for input(s): "PROBNP" in the last 8760 hours. HbA1C: No results for input(s): "HGBA1C" in the last 72 hours.  CBG: Recent Labs  Lab 12/02/22 1542 12/02/22 1942 12/02/22 2332 12/03/22 0335 12/03/22 0735  GLUCAP 204* 203* 105* 113* 96    Lipid Profile: No results for input(s): "CHOL", "HDL", "LDLCALC", "TRIG", "CHOLHDL", "LDLDIRECT" in the last 72 hours. Thyroid Function Tests: No results for input(s): "TSH", "T4TOTAL", "FREET4", "T3FREE", "THYROIDAB" in the last 72 hours. Anemia Panel: No results for input(s): "VITAMINB12", "FOLATE", "FERRITIN", "TIBC", "IRON", "RETICCTPCT" in the last 72 hours. Sepsis Labs: No results for input(s): "PROCALCITON", "LATICACIDVEN" in the last 168 hours.  Recent Results (from the past 240 hour(s))  MRSA Next Gen by PCR, Nasal     Status: None   Collection Time: 11/29/22 12:22 AM   Specimen: Nasal Mucosa; Nasal Swab  Result Value Ref Range Status   MRSA by PCR Next Gen NOT DETECTED NOT DETECTED Final    Comment: (NOTE) The GeneXpert MRSA Assay (FDA approved for NASAL specimens only), is one component of a comprehensive MRSA colonization surveillance program. It is not intended to diagnose MRSA infection nor to guide or monitor treatment for MRSA infections. Test performance is not FDA  approved in patients less than 70 years old. Performed at [facility], [place] [address], [place], [place] [address]   Resp panel by RT-PCR (RSV, Flu A&B, Covid) Anterior Nasal Swab     Status: Abnormal   Collection Time: 11/29/22 12:02 PM   Specimen: Anterior Nasal Swab  Result Value Ref Range Status   SARS Coronavirus 2 by RT PCR NEGATIVE NEGATIVE Final    Comment: (NOTE) SARS-CoV-2 target nucleic acids are NOT DETECTED.  The SARS-CoV-2 RNA is generally detectable in upper respiratory specimens during the acute phase of infection. The lowest concentration of SARS-CoV-2 viral copies this assay can detect is 138 copies/mL. A negative result does not preclude SARS-Cov-2 infection and should not be used as the sole basis for treatment or other patient management decisions. A negative result may occur with  improper specimen collection/handling, submission of specimen other than nasopharyngeal swab, presence of viral mutation(s) within the areas targeted by this assay, and inadequate number of viral copies(<138 copies/mL). A negative result must be combined with clinical observations, patient history, and epidemiological information. The expected result is Negative.  Fact Sheet for Patients:  EntrepreneurPulse.com.au  Fact Sheet for Healthcare Providers:  IncredibleEmployment.be  This test is no t yet approved or cleared by the [place] FDA and  has been authorized for detection and/or diagnosis of SARS-CoV-2 by FDA under an Emergency Use Authorization (EUA). This EUA will remain  in effect (meaning this test can be used) for the duration of the COVID-19 declaration under Section 564(b)(1) of the Act, 21 U.S.C.section 360bbb-3(b)(1), unless the authorization is terminated  or revoked sooner.       Influenza A by PCR POSITIVE (A) NEGATIVE Final   Influenza B by PCR NEGATIVE NEGATIVE Final    Comment: (NOTE) The Xpert Xpress  SARS-CoV-2/FLU/RSV plus assay is intended as an aid in the diagnosis of influenza from Nasopharyngeal swab specimens and should not be used as a sole basis for treatment. Nasal washings and aspirates are unacceptable for Xpert Xpress SARS-CoV-2/FLU/RSV testing.  Fact Sheet for Patients: EntrepreneurPulse.com.au  Fact Sheet for Healthcare Providers: IncredibleEmployment.be  This test is not yet approved or cleared by the [place] and  has been authorized for detection and/or diagnosis of SARS-CoV-2 by FDA under an Emergency Use Authorization (EUA). This EUA will remain in effect (meaning this test can be used) for the duration of the COVID-19 declaration under Section 564(b)(1) of the Act, 21 U.S.C. section 360bbb-3(b)(1), unless the authorization is terminated or revoked.     Resp Syncytial Virus by PCR NEGATIVE NEGATIVE Final    Comment: (NOTE) Fact Sheet for Patients: EntrepreneurPulse.com.au  Fact Sheet for Healthcare Providers: IncredibleEmployment.be  This test is not yet approved or cleared by the [place] FDA and has been authorized for detection and/or diagnosis of SARS-CoV-2 by FDA under an Emergency Use Authorization (EUA). This EUA will remain in effect (meaning this test can be used) for the duration of the COVID-19 declaration under Section 564(b)(1) of the Act, 21 U.S.C. section 360bbb-3(b)(1), unless the authorization is terminated or revoked.  Performed at [facility], [place] [address], [place], [place] [address]          Radiology Studies: DG Chest Port 1 View  Result Date: 12/02/2022 CLINICAL DATA:  Acute respiratory failure EXAM: PORTABLE CHEST 1 VIEW COMPARISON:  Chest x-ray dated November 30, 2022 FINDINGS: Unchanged cardiomegaly. Mild diffuse interstitial opacities, increased when compared with the prior exam. Small bilateral pleural effusions, similar to  prior. No evidence of pneumothorax. IMPRESSION: Mild diffuse interstitial opacities, increased when compared with the prior exam, likely due to worsened pulmonary edema. Electronically Signed   By: Yetta Glassman M.D.   On: 12/02/2022 08:15        Scheduled Meds:  apixaban  2.5 mg Oral BID   arformoterol  15 mcg Nebulization BID   budesonide (PULMICORT) nebulizer solution  0.5 mg Nebulization BID   Chlorhexidine Gluconate Cloth  6 each Topical Q0600   hydrALAZINE  25 mg Oral Q8H   insulin aspart  0-15 Units Subcutaneous Q4H   methylPREDNISolone (SOLU-MEDROL) injection  40 mg Intravenous Q12H   mouth rinse  15 mL Mouth Rinse 4 times per day   oseltamivir  30 mg Oral QODAY   revefenacin  175 mcg Nebulization Daily   sodium chloride flush  3 mL Intravenous Q12H   Continuous Infusions:  sodium chloride Stopped (12/01/22 1343)     LOS: 5 days    Time spent: 35 minutes    Barb Merino, MD [facility] Pager [phone]

## 2022-12-03 NOTE — Progress Notes (Addendum)
Patient ID: Jillian [name], female   DOB: [birth year], 87 y.o.   MRN: [id number] S: No events overnight.  Increased UOP Scr stable. O:BP (!) 160/52   Pulse (!) 59   Temp 97.7 F (36.5 C) (Oral)   Resp (!) 24   Ht 5\' 1"  (1.549 m)   Wt 87.7 kg   SpO2 97%   BMI 36.53 kg/m   Intake/Output Summary (Last 24 hours) at 12/03/2022 1216 Last data filed at 12/03/2022 0900 Gross per 24 hour  Intake 160 ml  Output 1780 ml  Net -1620 ml   Intake/Output: I/O last 3 completed shifts: In: 430 [P.O.:430] Out: 2405 [Urine:2405]  Intake/Output this shift:  Total I/O In: 120 [P.O.:120] Out: -  Weight change: -1.2 kg Gen:NAD DPO:EUMPNTIRWER, no rub Resp: scattered rhonchi and wheezes bilaterally Abd: +BS, soft, NT/ND Ext:2+ edema  Recent Labs  Lab 11/28/22 1928 11/28/22 2007 11/28/22 2149 11/29/22 0153 11/30/22 0038 11/30/22 0320 11/30/22 1018 12/01/22 0801 12/01/22 1015 12/02/22 0449 12/03/22 1014  NA 139 143  143   < > 140 138 138 138 139 138 137 138  K 4.1 3.8  3.8   < > 4.3 4.6 4.7 4.5 4.7 4.8 4.7 4.5  CL 102 104  --  104 101 102  --  103  --  102 102  CO2 25  --   --  23 26 25   --  25  --  25 28  GLUCOSE 124* 110*  --  126* 119* 113*  --  100*  --  116* 105*  BUN 53* 49*  --  54* 68* 69*  --  77*  --  81* 85*  CREATININE 4.11* 4.10*  --  4.04* 4.42* 4.46*  --  4.44*  --  4.42* 3.96*  ALBUMIN 3.0*  --   --  2.7*  --  2.7*  --   --   --  2.4* 2.4*  CALCIUM 8.2*  --   --  8.1* 7.8* 7.9*  --  8.0*  --  8.2* 8.3*  PHOS  --   --   --   --   --   --   --   --   --   --  4.2  AST 71*  --   --  64*  --  67*  --   --   --  37  --   ALT 74*  --   --  70*  --  73*  --   --   --  61*  --    < > = values in this interval not displayed.   Liver Function Tests: Recent Labs  Lab 11/29/22 0153 11/30/22 0320 12/02/22 0449  AST 64* 67* 37  ALT 70* 73* 61*  ALKPHOS 136* 134* 113  BILITOT 0.5 0.6 0.5  PROT 6.4* 6.3* 6.0*  ALBUMIN 2.7* 2.7* 2.4*   No results for input(s): "LIPASE",  "AMYLASE" in the last 168 hours. Recent Labs  Lab 11/28/22 2353  AMMONIA 19   CBC: Recent Labs  Lab 11/29/22 0153 11/30/22 0320 11/30/22 1018 12/01/22 0801 12/01/22 1015 12/02/22 0449 12/03/22 1014  WBC 6.3 7.7  --  7.7  --  8.2 8.8  NEUTROABS  --   --   --   --   --   --  7.1  HGB 9.0* 9.2*   < > 9.3* 9.9* 9.0* 9.4*  HCT 31.3* 32.2*   < > 30.9* 29.0* 29.1* 29.8*  MCV 95.7 93.3  --  90.9  --  88.2 86.9  PLT 135* 142*  --  144*  --  140* 137*   < > = values in this interval not displayed.   Cardiac Enzymes: No results for input(s): "CKTOTAL", "CKMB", "CKMBINDEX", "TROPONINI" in the last 168 hours. CBG: Recent Labs  Lab 12/02/22 1942 12/02/22 2332 12/03/22 0335 12/03/22 0735 12/03/22 1134  GLUCAP 203* 105* 113* 96 117*    Iron Studies: No results for input(s): "IRON", "TIBC", "TRANSFERRIN", "FERRITIN" in the last 72 hours. Studies/Results: DG Chest Port 1 View  Result Date: 12/02/2022 CLINICAL DATA:  Acute respiratory failure EXAM: PORTABLE CHEST 1 VIEW COMPARISON:  Chest x-ray dated November 30, 2022 FINDINGS: Unchanged cardiomegaly. Mild diffuse interstitial opacities, increased when compared with the prior exam. Small bilateral pleural effusions, similar to prior. No evidence of pneumothorax. IMPRESSION: Mild diffuse interstitial opacities, increased when compared with the prior exam, likely due to worsened pulmonary edema. Electronically Signed   By: Yetta Glassman M.D.   On: 12/02/2022 08:15    apixaban  2.5 mg Oral BID   arformoterol  15 mcg Nebulization BID   budesonide (PULMICORT) nebulizer solution  0.5 mg Nebulization BID   Chlorhexidine Gluconate Cloth  6 each Topical Q0600   hydrALAZINE  25 mg Oral Q8H   insulin aspart  0-15 Units Subcutaneous Q4H   methylPREDNISolone (SOLU-MEDROL) injection  40 mg Intravenous Q12H   mouth rinse  15 mL Mouth Rinse 4 times per day   oseltamivir  30 mg Oral QODAY   revefenacin  175 mcg Nebulization Daily   sodium chloride  flush  3 mL Intravenous Q12H    BMET    Component Value Date/Time   NA 137 12/02/2022 0449   NA 145 (H) 03/08/2022 1414   K 4.7 12/02/2022 0449   CL 102 12/02/2022 0449   CO2 25 12/02/2022 0449   GLUCOSE 116 (H) 12/02/2022 0449   BUN 81 (H) 12/02/2022 0449   BUN 39 (H) 03/08/2022 1414   CREATININE 4.42 (H) 12/02/2022 0449   CALCIUM 8.2 (L) 12/02/2022 0449   GFRNONAA 9 (L) 12/02/2022 0449   GFRAA 23 (L) 01/05/2021 1018   CBC    Component Value Date/Time   WBC 8.8 12/03/2022 1014   RBC 3.43 (L) 12/03/2022 1014   HGB 9.4 (L) 12/03/2022 1014   HGB 10.2 (L) 03/08/2022 1414   HCT 29.8 (L) 12/03/2022 1014   HCT 31.7 (L) 03/08/2022 1414   PLT 137 (L) 12/03/2022 1014   PLT 225 03/08/2022 1414   MCV 86.9 12/03/2022 1014   MCV 86 03/08/2022 1414   MCH 27.4 12/03/2022 1014   MCHC 31.5 12/03/2022 1014   RDW 15.7 (H) 12/03/2022 1014   RDW 13.0 03/08/2022 1414   LYMPHSABS 0.9 12/03/2022 1014   LYMPHSABS 2.3 03/08/2022 1414   MONOABS 0.6 12/03/2022 1014   EOSABS 0.0 12/03/2022 1014   EOSABS 0.1 03/08/2022 1414   BASOSABS 0.0 12/03/2022 1014   BASOSABS 0.0 03/08/2022 1414    Assessment/Plan:  AKI/CKD stage IV - likely ischemic ATN following A fib with RVR and hypotension requiring pressors as well as acute on chronic diastolic CHF.  Pt was given IV lasix 120 mg initially but then held after she developed AKI.  No uremic symptoms.  UOP had dropped off but improved with IV lasix 60 mg.  continue with IV lasix 60 mg daily and follow.  Pt is a poor candidate for dialysis given her advanced age, multiple co-morbidities (including cognitive impairment), and poor  functional and nutritional status.  If she doesn't improve, would recommend palliative care consult to help set goals/limits of care.   Acute respiratory failure with hypercarbia in setting of influenza A - was on BiPAP as needed.  On solumedrol, brovana, pulmicort and Yuperli per PCCM Influenza A - tamiflu for 5 days A fib with RVR  s/p cardioversion - on Eliquis Acute on chronic diastolic CHF - likely due to A fib with RVR and AKI.   Acute encephalopathy - due to hyeprcarbia.  Improving. Elevated LFT's - likely shock liver vs congestion.  Anemia of CKD stage IV - transfuse prn. Disposition - overall prognosis is poor and not a candidate for dialysis.  Recommend Palliative care consult to help set goals/limits of care.  Pt does have underlying cognitive impairment.  Donetta Potts, MD [employer] [phone]

## 2022-12-03 NOTE — Evaluation (Signed)
Occupational Therapy Evaluation Patient Details Name: Jillian [name] MRN: [id number] DOB: 11-28-1935 Today's Date: 12/03/2022   History of Present Illness 87 yo female admitted 1/14 with respiratory failure, Afib with RVR and flu (+). Cardioversion 1/14. PMhx; CKD, CHF, CVA, HTN, Afib   Clinical Impression   Patient admitted for the diagnosis above.  PTA she lives with her children, and was Independent with mobility, ADL and iADL.  Currently she remains lethargic, and is needing up to [place] for mobility and Mod A for lower body ADL.  OT will follow in the acute setting to address deficits listed below, and assist with eventual transition to next level of care.  HH has been recommended, but OT would want to see her more alert and able to participate better for home, so SNF may be needed depending on progress.        Recommendations for follow up therapy are one component of a multi-disciplinary discharge planning process, led by the attending physician.  Recommendations may be updated based on patient status, additional functional criteria and insurance authorization.   Follow Up Recommendations  Other (comment) ([place] OT with family assist vs. SNF)     Assistance Recommended at Discharge Frequent or constant Supervision/Assistance  Patient can return home with the following Assist for transportation;Assistance with cooking/housework;A lot of help with bathing/dressing/bathroom;A little help with walking and/or transfers;Direct supervision/assist for financial management;Direct supervision/assist for medications management    Functional Status Assessment  Patient has had a recent decline in their functional status and demonstrates the ability to make significant improvements in function in a reasonable and predictable amount of time.  Equipment Recommendations  None recommended by OT    Recommendations for Other Services       Precautions / Restrictions Precautions Precautions:  Fall Precaution Comments: watch sats Restrictions Weight Bearing Restrictions: No      Mobility Bed Mobility Overal bed mobility: Needs Assistance Bed Mobility: Supine to Sit     Supine to sit: Min assist          Transfers Overall transfer level: Needs assistance   Transfers: Sit to/from Stand, Bed to chair/wheelchair/BSC Sit to Stand: Min assist     Step pivot transfers: Min guard, Min assist            Balance Overall balance assessment: Needs assistance Sitting-balance support: No upper extremity supported, Feet supported Sitting balance-Leahy Scale: Good     Standing balance support: Reliant on assistive device for balance Standing balance-Leahy Scale: Poor                             ADL either performed or assessed with clinical judgement   ADL Overall ADL's : Needs assistance/impaired Eating/Feeding: Set up;Sitting   Grooming: Wash/dry hands;Wash/dry face;Set up;Sitting           Upper Body Dressing : Minimal assistance;Sitting   Lower Body Dressing: Moderate assistance;Sit to/from stand   Toilet Transfer: Minimal assistance;Rolling walker (2 wheels);Ambulation                   Vision   Vision Assessment?: No apparent visual deficits     Perception     Praxis      Pertinent Vitals/Pain Pain Assessment Pain Assessment: No/denies pain Pain Intervention(s): Monitored during session     Hand Dominance Right   Extremity/Trunk Assessment Upper Extremity Assessment Upper Extremity Assessment: Generalized weakness   Lower Extremity Assessment Lower Extremity Assessment: Defer to  PT evaluation   Cervical / Trunk Assessment Cervical / Trunk Assessment: Kyphotic   Communication Communication Communication: No difficulties   Cognition Arousal/Alertness: Lethargic Behavior During Therapy: Flat affect Overall Cognitive Status: No family/caregiver present to determine baseline cognitive functioning                    Orientation Level: Disoriented to, Time, Place Current Attention Level: Sustained Memory: Decreased recall of precautions Following Commands: Follows one step commands consistently, Follows multi-step commands with increased time     Problem Solving: Slow processing, Requires verbal cues       General Comments       Exercises     Shoulder Instructions      Home Living Family/patient expects to be discharged to:: Private residence Living Arrangements: Other relatives;Children Available Help at Discharge: Family;Available PRN/intermittently Type of Home: House Home Access: Stairs to enter Entrance Stairs-Number of Steps: 1   Home Layout: One level     Bathroom Shower/Tub: Teacher, early years/pre: Handicapped height     Home Equipment: Rollator (4 wheels);Shower seat;BSC/3in1          Prior Functioning/Environment Prior Level of Function : Independent/Modified Independent               ADLs Comments: No assist for ADLs and IADLs.        OT Problem List: Decreased strength;Decreased activity tolerance;Impaired balance (sitting and/or standing);Decreased cognition      OT Treatment/Interventions: Self-care/ADL training;Therapeutic activities;Patient/family education;DME and/or AE instruction;Balance training;Cognitive remediation/compensation    OT Goals(Current goals can be found in the care plan section) Acute Rehab OT Goals Patient Stated Goal: None stated OT Goal Formulation: With patient Time For Goal Achievement: 12/17/22 Potential to Achieve Goals: Fair  OT Frequency: Min 2X/week    Co-evaluation              AM-PAC OT "6 Clicks" Daily Activity     Outcome Measure Help from another person eating meals?: A Little Help from another person taking care of personal grooming?: A Little Help from another person toileting, which includes using toliet, bedpan, or urinal?: A Lot Help from another person bathing (including  washing, rinsing, drying)?: A Lot Help from another person to put on and taking off regular upper body clothing?: A Little Help from another person to put on and taking off regular lower body clothing?: A Lot 6 Click Score: 15   End of Session Equipment Utilized During Treatment: Rolling walker (2 wheels);Oxygen Nurse Communication: Mobility status  Activity Tolerance: Patient tolerated treatment well Patient left: in chair;with call bell/phone within reach;with chair alarm set  OT Visit Diagnosis: Unsteadiness on feet (R26.81);Muscle weakness (generalized) (M62.81)                Time: 9794-8016 OT Time Calculation (min): 21 min Charges:  OT General Charges $OT Visit: 1 Visit OT Evaluation $OT Eval Moderate Complexity: 1 Mod  12/03/2022  RP, OTR/L  Acute Rehabilitation Services  Office:  [phone]   Metta Clines 12/03/2022, 4:33 PM

## 2022-12-04 DIAGNOSIS — J9602 Acute respiratory failure with hypercapnia: Secondary | ICD-10-CM | POA: Diagnosis not present

## 2022-12-04 LAB — GLUCOSE, CAPILLARY
Glucose-Capillary: 134 mg/dL — ABNORMAL HIGH (ref 70–99)
Glucose-Capillary: 133 mg/dL — ABNORMAL HIGH (ref 70–99)
Glucose-Capillary: 119 mg/dL — ABNORMAL HIGH (ref 70–99)
Glucose-Capillary: 211 mg/dL — ABNORMAL HIGH (ref 70–99)
Glucose-Capillary: 185 mg/dL — ABNORMAL HIGH (ref 70–99)
Glucose-Capillary: 138 mg/dL — ABNORMAL HIGH (ref 70–99)

## 2022-12-04 LAB — RENAL FUNCTION PANEL
Albumin: 2.5 g/dL — ABNORMAL LOW (ref 3.5–5.0)
Anion gap: 10 (ref 5–15)
BUN: 83 mg/dL — ABNORMAL HIGH (ref 8–23)
CO2: 29 mmol/L (ref 22–32)
Calcium: 8.6 mg/dL — ABNORMAL LOW (ref 8.9–10.3)
Chloride: 102 mmol/L (ref 98–111)
Creatinine, Ser: 3.65 mg/dL — ABNORMAL HIGH (ref 0.44–1.00)
GFR, Estimated: 12 mL/min — ABNORMAL LOW (ref >60)
Glucose, Bld: 131 mg/dL — ABNORMAL HIGH (ref 70–99)
Phosphorus: 4.3 mg/dL (ref 2.5–4.6)
Potassium: 4.3 mmol/L (ref 3.5–5.1)
Sodium: 141 mmol/L (ref 135–145)

## 2022-12-04 MED ORDER — HYDRALAZINE HCL 50 MG PO TABS
100.0000 mg | ORAL_TABLET | Freq: Three times a day (TID) | ORAL | Status: DC
  Administered 2022-12-04 – 2022-12-05 (×4): 100 mg via ORAL
  Filled 2022-12-04 (×4): qty 2

## 2022-12-04 MED ORDER — AMLODIPINE BESYLATE 5 MG PO TABS
5.0000 mg | ORAL_TABLET | Freq: Every day | ORAL | Status: DC
  Administered 2022-12-04 – 2022-12-08 (×4): 5 mg via ORAL
  Filled 2022-12-04 (×4): qty 1

## 2022-12-04 MED ORDER — METHYLPREDNISOLONE SODIUM SUCC 125 MG IJ SOLR
40.0000 mg | Freq: Every day | INTRAMUSCULAR | Status: DC
  Administered 2022-12-05 – 2022-12-08 (×4): 40 mg via INTRAVENOUS
  Filled 2022-12-04 (×4): qty 2

## 2022-12-04 NOTE — Progress Notes (Signed)
Patient ID: Jillian [name], female   DOB: 1935/12/03, 87 y.o.   MRN: [id number] S: No events overnight. O:BP (!) 183/58   Pulse 66   Temp 97.7 F (36.5 C) (Oral)   Resp 18   Ht 5\' 1"  (1.549 m)   Wt 87.7 kg   SpO2 99%   BMI 36.53 kg/m   Intake/Output Summary (Last 24 hours) at 12/04/2022 1053 Last data filed at 12/04/2022 0920 Gross per 24 hour  Intake 273 ml  Output 3000 ml  Net -2727 ml   Intake/Output: I/O last 3 completed shifts: In: 393 [P.O.:390; I.V.:3] Out: 3900 [Urine:3900]  Intake/Output this shift:  Total I/O In: -  Out: 500 [Urine:500] Weight change:  OFB:PZWCH, chronically ill-appearing in NAD CVS:RRR Resp: scattered rhonchi and wheezes bilaterally Abd: +BS, soft, NT/ND Ext: 1+ presacral edema  Recent Labs  Lab 11/28/22 1928 11/28/22 2007 11/29/22 0153 11/30/22 0038 11/30/22 0320 11/30/22 1018 12/01/22 0801 12/01/22 1015 12/02/22 0449 12/03/22 1014 12/04/22 0802  NA 139   < > 140 138 138 138 139 138 137 138 141  K 4.1   < > 4.3 4.6 4.7 4.5 4.7 4.8 4.7 4.5 4.3  CL 102   < > 104 101 102  --  103  --  102 102 102  CO2 25  --  23 26 25   --  25  --  25 28 29   GLUCOSE 124*   < > 126* 119* 113*  --  100*  --  116* 105* 131*  BUN 53*   < > 54* 68* 69*  --  77*  --  81* 85* 83*  CREATININE 4.11*   < > 4.04* 4.42* 4.46*  --  4.44*  --  4.42* 3.96* 3.65*  ALBUMIN 3.0*  --  2.7*  --  2.7*  --   --   --  2.4* 2.4* 2.5*  CALCIUM 8.2*  --  8.1* 7.8* 7.9*  --  8.0*  --  8.2* 8.3* 8.6*  PHOS  --   --   --   --   --   --   --   --   --  4.2 4.3  AST 71*  --  64*  --  67*  --   --   --  37  --   --   ALT 74*  --  70*  --  73*  --   --   --  61*  --   --    < > = values in this interval not displayed.   Liver Function Tests: Recent Labs  Lab 11/29/22 0153 11/30/22 0320 12/02/22 0449 12/03/22 1014 12/04/22 0802  AST 64* 67* 37  --   --   ALT 70* 73* 61*  --   --   ALKPHOS 136* 134* 113  --   --   BILITOT 0.5 0.6 0.5  --   --   PROT 6.4* 6.3* 6.0*  --   --    ALBUMIN 2.7* 2.7* 2.4* 2.4* 2.5*   No results for input(s): "LIPASE", "AMYLASE" in the last 168 hours. Recent Labs  Lab 11/28/22 2353  AMMONIA 19   CBC: Recent Labs  Lab 11/29/22 0153 11/30/22 0320 11/30/22 1018 12/01/22 0801 12/01/22 1015 12/02/22 0449 12/03/22 1014  WBC 6.3 7.7  --  7.7  --  8.2 8.8  NEUTROABS  --   --   --   --   --   --  7.1  HGB 9.0*  9.2*   < > 9.3* 9.9* 9.0* 9.4*  HCT 31.3* 32.2*   < > 30.9* 29.0* 29.1* 29.8*  MCV 95.7 93.3  --  90.9  --  88.2 86.9  PLT 135* 142*  --  144*  --  140* 137*   < > = values in this interval not displayed.   Cardiac Enzymes: No results for input(s): "CKTOTAL", "CKMB", "CKMBINDEX", "TROPONINI" in the last 168 hours. CBG: Recent Labs  Lab 12/03/22 1700 12/03/22 1927 12/03/22 2300 12/04/22 0310 12/04/22 0759  GLUCAP 160* 158* 136* 134* 133*    Iron Studies: No results for input(s): "IRON", "TIBC", "TRANSFERRIN", "FERRITIN" in the last 72 hours. Studies/Results: No results found.  amLODipine  5 mg Oral Daily   apixaban  2.5 mg Oral BID   arformoterol  15 mcg Nebulization BID   budesonide (PULMICORT) nebulizer solution  0.5 mg Nebulization BID   Chlorhexidine Gluconate Cloth  6 each Topical Q0600   furosemide  60 mg Intravenous Daily   hydrALAZINE  100 mg Oral Q8H   insulin aspart  0-15 Units Subcutaneous Q4H   methylPREDNISolone (SOLU-MEDROL) injection  40 mg Intravenous Q12H   mouth rinse  15 mL Mouth Rinse 4 times per day   oseltamivir  30 mg Oral QODAY   polyethylene glycol  17 g Oral Daily   revefenacin  175 mcg Nebulization Daily   sodium chloride flush  3 mL Intravenous Q12H    BMET    Component Value Date/Time   NA 141 12/04/2022 0802   NA 145 (H) 03/08/2022 1414   K 4.3 12/04/2022 0802   CL 102 12/04/2022 0802   CO2 29 12/04/2022 0802   GLUCOSE 131 (H) 12/04/2022 0802   BUN 83 (H) 12/04/2022 0802   BUN 39 (H) 03/08/2022 1414   CREATININE 3.65 (H) 12/04/2022 0802   CALCIUM 8.6 (L) 12/04/2022  0802   GFRNONAA 12 (L) 12/04/2022 0802   GFRAA 23 (L) 01/05/2021 1018   CBC    Component Value Date/Time   WBC 8.8 12/03/2022 1014   RBC 3.43 (L) 12/03/2022 1014   HGB 9.4 (L) 12/03/2022 1014   HGB 10.2 (L) 03/08/2022 1414   HCT 29.8 (L) 12/03/2022 1014   HCT 31.7 (L) 03/08/2022 1414   PLT 137 (L) 12/03/2022 1014   PLT 225 03/08/2022 1414   MCV 86.9 12/03/2022 1014   MCV 86 03/08/2022 1414   MCH 27.4 12/03/2022 1014   MCHC 31.5 12/03/2022 1014   RDW 15.7 (H) 12/03/2022 1014   RDW 13.0 03/08/2022 1414   LYMPHSABS 0.9 12/03/2022 1014   LYMPHSABS 2.3 03/08/2022 1414   MONOABS 0.6 12/03/2022 1014   EOSABS 0.0 12/03/2022 1014   EOSABS 0.1 03/08/2022 1414   BASOSABS 0.0 12/03/2022 1014   BASOSABS 0.0 03/08/2022 1414    Assessment/Plan:  AKI/CKD stage IV - likely ischemic ATN following A fib with RVR and hypotension requiring pressors as well as acute on chronic diastolic CHF.  Pt was given IV lasix 120 mg initially but then held after she developed AKI.  No uremic symptoms.  UOP had dropped off but improved with IV lasix 60 mg.  Likely cardiorenal component as well.  She is responding to IV lasix 60 mg daily with improvement of UOP and SCr.  Will continue with IV lasix 60 mg daily and follow.  Pt is a poor candidate for dialysis given her advanced age, multiple co-morbidities (including cognitive impairment), and poor functional and nutritional status.  This was discussed  with her primary Nephrologist, Dr. Joylene Grapes who did discuss this with her daughter in the past.  If she doesn't improve, would recommend palliative care consult to help set goals/limits of care.   Acute respiratory failure with hypercarbia in setting of influenza A - was on BiPAP as needed.  On solumedrol, brovana, pulmicort and Yu[place]li per PCCM Influenza A - tamiflu for 5 days A fib with RVR s/p cardioversion - on Eliquis Acute on chronic diastolic CHF - likely due to A fib with RVR and AKI.   Acute encephalopathy -  due to hyeprcarbia.  Improving. Elevated LFT's - likely shock liver vs congestion.  Anemia of CKD stage IV - transfuse prn. Disposition - overall prognosis is poor and not a candidate for dialysis.  Recommend Palliative care consult to help set goals/limits of care.  Pt does have underlying cognitive impairment.  Donetta Potts, MD [facility]

## 2022-12-04 NOTE — Plan of Care (Signed)
Updated care plan.

## 2022-12-04 NOTE — Progress Notes (Signed)
PROGRESS NOTE    Jillian [name]  KZL:[id number] DOB: [birth year] DOA: 11/28/2022 PCP: [name], MD    Brief Narrative:  87 year old with history of CKD stage IV, chronic diastolic heart failure, stroke, hypertension, paroxysmal A-fib on Eliquis presented to the emergency room from home on 1/14 with respiratory distress.  In the emergency room she was tachypneic but afebrile.  Initially placed on BiPAP and treated with bronchodilators and steroids.  Chest x-ray consistent with fluid overload.  She was also found with rapid A-fib, she was cardioverted and rate was controlled.  Subsequently after cardioversion she went into hypotensive episode and was started with peripheral Levophed and transferred to ICU on BiPAP and peripheral Levophed.  1/14 admitted to [facility] resp distress on bipap; afib rvr cardioverted then hypotensive and bradycardic placed on levo 1/15 Started Tamiflu for positive flu PCR.  Did not respond to IV Lasix 1/16 started IV fluids for AKI 1/18, intermittently short of breath with pulmonary edema on chest x-ray.  Using BiPAP as needed.  Nephrology consulted. 1/20, remains debilitated very frail.  Renal function somewhat improving.  Persistently hypertensive.  Assessment & Plan:   Acute hypercapnic respiratory failure: In the setting of influenza A infection and fluid overload. Continue bronchodilator therapy, taper off IV steroids, continue inhalational steroids, scheduled and as needed bronchodilators, deep breathing exercises, incentive spirometry, chest physiotherapy. Tamiflu, renally dosed for 5 days.  Completed therapy today. Supplemental oxygen to keep saturations more than 90%. BiPAP as needed for respiratory distress.  Out of bed.  Continue to work with PT OT.  Acute kidney injury on chronic kidney disease stage IV: Known baseline creatinine of 2.2.  Urine output reported 2500 mL last 24 hours on IV Lasix 60 mg daily.   Urine output is adequate and responding to  diuresis. Dialysis likely not possible in her case with frailty and debility. Followed by nephrology.    Essential hypertension: Uncontrolled now. Could not tolerate beta-blockers due to bradycardia. Hydralazine, increase to 100 mg 3 times daily today.  Add amlodipine 5 mg.  May further escalate doses.  A-fib with RVR: Status post cardioversion and developed bradycardia.  Therapeutic on Eliquis.  Heart rate well-controlled now in sinus rhythm. Tends to be bradycardic.  Discontinue carvedilol.    Acute metabolic encephalopathy due to hypercapnia: Mental status gradually normalizing.  More awake today but remains very debilitated.  Chronic anemia and thrombocytopenia: Stable.  Start mobilizing with PT OT.  Probable skilled nursing facility placement if renal functions normalize otherwise patient may need palliation and hospice. This was communicated with patient's granddaughter who is her next of kin and decision maker.  Family is aware about critical nature of her renal disease. Discussed palliative care consultation the meeting.  Family agreed.  Granddaughter is agreeable to meet with palliative care and discuss goals of care, outcome and future hospice options.   DVT prophylaxis: apixaban (ELIQUIS) tablet 2.5 mg Start: 12/01/22 1015 apixaban (ELIQUIS) tablet 2.5 mg   Code Status: Full code Family Communication: [place] Daughter [name] on the phone.  Disposition Plan: Status is: Inpatient Remains inpatient appropriate because: Respiratory distress, renal functions abnormality.     Consultants:  Critical care Nephrology Palliative care  Procedures:  Cardioversion  Antimicrobials:  None   Subjective:  Patient seen and examined.  She is alert and awake but very frail and debilitated.  Unable to keep up conversation.  Blood pressure is elevated and adjustments being done in her BP medications. She did not require any BiPAP overnight.  Stayed on 2 L of oxygen. Unable to take  any deep breaths or use incentive spirometry.  Objective: Vitals:   12/04/22 0700 12/04/22 0752 12/04/22 0832 12/04/22 0900  BP: (!) 188/70 (!) 208/72  (!) 183/58  Pulse: 76 78  66  Resp: (!) 23 20 (!) 22 18  Temp:  97.7 F (36.5 C)    TempSrc:  Oral    SpO2: 100% 98% 98% 99%  Weight:      Height:        Intake/Output Summary (Last 24 hours) at 12/04/2022 1100 Last data filed at 12/04/2022 0920 Gross per 24 hour  Intake 273 ml  Output 3000 ml  Net -2727 ml   Filed Weights   12/01/22 0500 12/02/22 0344 12/03/22 0500  Weight: 88.9 kg 88.9 kg 87.7 kg    Examination:  General exam: Sick looking. debilitated.  Anxious and in mild distress while having conversation.  On 2 L of oxygen. Respiratory system: Mostly conducted upper airway sounds.  Poor bilateral air entry.  SpO2: 99 % O2 Flow Rate (L/min): 2 L/min FiO2 (%): 28 %  Cardiovascular system: S1 & S2 heard, RRR.  Bilateral pedal edema.  Left more than right.  Gastrointestinal system: Abdomen is nondistended, soft and nontender. No organomegaly or masses felt. Normal bowel sounds heard.  Pure wick with clear urine. Central nervous system: Alert and awake.  Lethargic.  Follows simple commands. Moves all extremities.   Data Reviewed: I have personally reviewed following labs and imaging studies  CBC: Recent Labs  Lab 11/29/22 0153 11/30/22 0320 11/30/22 1018 12/01/22 0801 12/01/22 1015 12/02/22 0449 12/03/22 1014  WBC 6.3 7.7  --  7.7  --  8.2 8.8  NEUTROABS  --   --   --   --   --   --  7.1  HGB 9.0* 9.2* 9.9* 9.3* 9.9* 9.0* 9.4*  HCT 31.3* 32.2* 29.0* 30.9* 29.0* 29.1* 29.8*  MCV 95.7 93.3  --  90.9  --  88.2 86.9  PLT 135* 142*  --  144*  --  140* 683*   Basic Metabolic Panel: Recent Labs  Lab 11/28/22 2351 11/29/22 0153 11/30/22 0320 11/30/22 1018 12/01/22 0801 12/01/22 1015 12/02/22 0449 12/03/22 1014 12/04/22 0802  NA  --    < > 138   < > 139 138 137 138 141  K  --    < > 4.7   < > 4.7 4.8 4.7 4.5  4.3  CL  --    < > 102  --  103  --  102 102 102  CO2  --    < > 25  --  25  --  25 28 29   GLUCOSE  --    < > 113*  --  100*  --  116* 105* 131*  BUN  --    < > 69*  --  77*  --  81* 85* 83*  CREATININE  --    < > 4.46*  --  4.44*  --  4.42* 3.96* 3.65*  CALCIUM  --    < > 7.9*  --  8.0*  --  8.2* 8.3* 8.6*  MG 2.3  --   --   --   --   --  2.8* 2.7*  --   PHOS  --   --   --   --   --   --   --  4.2 4.3   < > = values in this interval  not displayed.   GFR: Estimated Creatinine Clearance: 11.6 mL/min (A) (by C-G formula based on SCr of 3.65 mg/dL (H)). Liver Function Tests: Recent Labs  Lab 11/28/22 1928 11/29/22 0153 11/30/22 0320 12/02/22 0449 12/03/22 1014 12/04/22 0802  AST 71* 64* 67* 37  --   --   ALT 74* 70* 73* 61*  --   --   ALKPHOS 151* 136* 134* 113  --   --   BILITOT 0.7 0.5 0.6 0.5  --   --   PROT 7.1 6.4* 6.3* 6.0*  --   --   ALBUMIN 3.0* 2.7* 2.7* 2.4* 2.4* 2.5*   No results for input(s): "LIPASE", "AMYLASE" in the last 168 hours. Recent Labs  Lab 11/28/22 2353  AMMONIA 19   Coagulation Profile: Recent Labs  Lab 11/28/22 2351  INR 1.5*   Cardiac Enzymes: No results for input(s): "CKTOTAL", "CKMB", "CKMBINDEX", "TROPONINI" in the last 168 hours. BNP (last 3 results) No results for input(s): "PROBNP" in the last 8760 hours. HbA1C: No results for input(s): "HGBA1C" in the last 72 hours.  CBG: Recent Labs  Lab 12/03/22 1700 12/03/22 1927 12/03/22 2300 12/04/22 0310 12/04/22 0759  GLUCAP 160* 158* 136* 134* 133*   Lipid Profile: No results for input(s): "CHOL", "HDL", "LDLCALC", "TRIG", "CHOLHDL", "LDLDIRECT" in the last 72 hours. Thyroid Function Tests: No results for input(s): "TSH", "T4TOTAL", "FREET4", "T3FREE", "THYROIDAB" in the last 72 hours. Anemia Panel: No results for input(s): "VITAMINB12", "FOLATE", "FERRITIN", "TIBC", "IRON", "RETICCTPCT" in the last 72 hours. Sepsis Labs: No results for input(s): "PROCALCITON", "LATICACIDVEN" in  the last 168 hours.  Recent Results (from the past 240 hour(s))  MRSA Next Gen by PCR, Nasal     Status: None   Collection Time: 11/29/22 12:22 AM   Specimen: Nasal Mucosa; Nasal Swab  Result Value Ref Range Status   MRSA by PCR Next Gen NOT DETECTED NOT DETECTED Final    Comment: (NOTE) The GeneXpert MRSA Assay (FDA approved for NASAL specimens only), is one component of a comprehensive MRSA colonization surveillance program. It is not intended to diagnose MRSA infection nor to guide or monitor treatment for MRSA infections. Test performance is not FDA approved in patients less than 65 years old. Performed at [facility], [place] [address], [place], [place] [address]   Resp panel by RT-PCR (RSV, Flu A&B, Covid) Anterior Nasal Swab     Status: Abnormal   Collection Time: 11/29/22 12:02 PM   Specimen: Anterior Nasal Swab  Result Value Ref Range Status   SARS Coronavirus 2 by RT PCR NEGATIVE NEGATIVE Final    Comment: (NOTE) SARS-CoV-2 target nucleic acids are NOT DETECTED.  The SARS-CoV-2 RNA is generally detectable in upper respiratory specimens during the acute phase of infection. The lowest concentration of SARS-CoV-2 viral copies this assay can detect is 138 copies/mL. A negative result does not preclude SARS-Cov-2 infection and should not be used as the sole basis for treatment or other patient management decisions. A negative result may occur with  improper specimen collection/handling, submission of specimen other than nasopharyngeal swab, presence of viral mutation(s) within the areas targeted by this assay, and inadequate number of viral copies(<138 copies/mL). A negative result must be combined with clinical observations, patient history, and epidemiological information. The expected result is Negative.  Fact Sheet for Patients:  EntrepreneurPulse.com.au  Fact Sheet for Healthcare Providers:  IncredibleEmployment.be  This  test is no t yet approved or cleared by the [place] FDA and  has  been authorized for detection and/or diagnosis of SARS-CoV-2 by FDA under an Emergency Use Authorization (EUA). This EUA will remain  in effect (meaning this test can be used) for the duration of the COVID-19 declaration under Section 564(b)(1) of the Act, 21 U.S.C.section 360bbb-3(b)(1), unless the authorization is terminated  or revoked sooner.       Influenza A by PCR POSITIVE (A) NEGATIVE Final   Influenza B by PCR NEGATIVE NEGATIVE Final    Comment: (NOTE) The Xpert Xpress SARS-CoV-2/FLU/RSV plus assay is intended as an aid in the diagnosis of influenza from Nasopharyngeal swab specimens and should not be used as a sole basis for treatment. Nasal washings and aspirates are unacceptable for Xpert Xpress SARS-CoV-2/FLU/RSV testing.  Fact Sheet for Patients: EntrepreneurPulse.com.au  Fact Sheet for Healthcare Providers: IncredibleEmployment.be  This test is not yet approved or cleared by the [place] FDA and has been authorized for detection and/or diagnosis of SARS-CoV-2 by FDA under an Emergency Use Authorization (EUA). This EUA will remain in effect (meaning this test can be used) for the duration of the COVID-19 declaration under Section 564(b)(1) of the Act, 21 U.S.C. section 360bbb-3(b)(1), unless the authorization is terminated or revoked.     Resp Syncytial Virus by PCR NEGATIVE NEGATIVE Final    Comment: (NOTE) Fact Sheet for Patients: EntrepreneurPulse.com.au  Fact Sheet for Healthcare Providers: IncredibleEmployment.be  This test is not yet approved or cleared by the [place] FDA and has been authorized for detection and/or diagnosis of SARS-CoV-2 by FDA under an Emergency Use Authorization (EUA). This EUA will remain in effect (meaning this test can be used) for the duration of the COVID-19 declaration under  Section 564(b)(1) of the Act, 21 U.S.C. section 360bbb-3(b)(1), unless the authorization is terminated or revoked.  Performed at [facility], [place] [address], [place], [place] [address]          Radiology Studies: No results found.      Scheduled Meds:  amLODipine  5 mg Oral Daily   apixaban  2.5 mg Oral BID   arformoterol  15 mcg Nebulization BID   budesonide (PULMICORT) nebulizer solution  0.5 mg Nebulization BID   Chlorhexidine Gluconate Cloth  6 each Topical Q0600   furosemide  60 mg Intravenous Daily   hydrALAZINE  100 mg Oral Q8H   insulin aspart  0-15 Units Subcutaneous Q4H   [START ON 12/05/2022] methylPREDNISolone (SOLU-MEDROL) injection  40 mg Intravenous Daily   mouth rinse  15 mL Mouth Rinse 4 times per day   polyethylene glycol  17 g Oral Daily   revefenacin  175 mcg Nebulization Daily   sodium chloride flush  3 mL Intravenous Q12H   Continuous Infusions:  sodium chloride Stopped (12/01/22 1343)     LOS: 6 days    Time spent: 35 minutes    Barb Merino, MD [facility] Pager [phone]

## 2022-12-04 NOTE — Plan of Care (Signed)
Progressing toward goals. 

## 2022-12-04 NOTE — Progress Notes (Signed)
Patient currently on 2L [place] and tolerating well, BIPAP not indicated at this time. RT will continue to monitor.

## 2022-12-05 ENCOUNTER — Inpatient Hospital Stay (HOSPITAL_COMMUNITY): Payer: Medicare Other

## 2022-12-05 DIAGNOSIS — N179 Acute kidney failure, unspecified: Secondary | ICD-10-CM | POA: Diagnosis not present

## 2022-12-05 DIAGNOSIS — Z515 Encounter for palliative care: Secondary | ICD-10-CM

## 2022-12-05 DIAGNOSIS — J9602 Acute respiratory failure with hypercapnia: Secondary | ICD-10-CM | POA: Diagnosis not present

## 2022-12-05 DIAGNOSIS — Z7189 Other specified counseling: Secondary | ICD-10-CM

## 2022-12-05 LAB — GLUCOSE, CAPILLARY
Glucose-Capillary: 97 mg/dL (ref 70–99)
Glucose-Capillary: 96 mg/dL (ref 70–99)
Glucose-Capillary: 126 mg/dL — ABNORMAL HIGH (ref 70–99)
Glucose-Capillary: 159 mg/dL — ABNORMAL HIGH (ref 70–99)
Glucose-Capillary: 164 mg/dL — ABNORMAL HIGH (ref 70–99)
Glucose-Capillary: 201 mg/dL — ABNORMAL HIGH (ref 70–99)
Glucose-Capillary: 159 mg/dL — ABNORMAL HIGH (ref 70–99)

## 2022-12-05 LAB — RENAL FUNCTION PANEL
Albumin: 2.2 g/dL — ABNORMAL LOW (ref 3.5–5.0)
Anion gap: 9 (ref 5–15)
BUN: 79 mg/dL — ABNORMAL HIGH (ref 8–23)
CO2: 32 mmol/L (ref 22–32)
Calcium: 8.6 mg/dL — ABNORMAL LOW (ref 8.9–10.3)
Chloride: 102 mmol/L (ref 98–111)
Creatinine, Ser: 3.37 mg/dL — ABNORMAL HIGH (ref 0.44–1.00)
GFR, Estimated: 13 mL/min — ABNORMAL LOW (ref >60)
Glucose, Bld: 85 mg/dL (ref 70–99)
Phosphorus: 3.6 mg/dL (ref 2.5–4.6)
Potassium: 3.9 mmol/L (ref 3.5–5.1)
Sodium: 143 mmol/L (ref 135–145)

## 2022-12-05 LAB — BLOOD GAS, ARTERIAL
Acid-Base Excess: 11.8 mmol/L — ABNORMAL HIGH (ref 0.0–2.0)
Bicarbonate: 37.7 mmol/L — ABNORMAL HIGH (ref 20.0–28.0)
O2 Saturation: 98.4 %
Patient temperature: 36.8
pCO2 arterial: 53 mmHg — ABNORMAL HIGH (ref 32–48)
pH, Arterial: 7.46 — ABNORMAL HIGH (ref 7.35–7.45)
pO2, Arterial: 83 mmHg (ref 83–108)

## 2022-12-05 MED ORDER — AMIODARONE IV BOLUS ONLY 150 MG/100ML
150.0000 mg | Freq: Once | INTRAVENOUS | Status: DC
  Filled 2022-12-05: qty 100

## 2022-12-05 MED ORDER — LEVALBUTEROL HCL 1.25 MG/0.5ML IN NEBU: 1.2500 mg | INHALATION_SOLUTION | Freq: Four times a day (QID) | RESPIRATORY_TRACT | Status: DC | PRN

## 2022-12-05 MED ORDER — AMIODARONE LOAD VIA INFUSION
150.0000 mg | Freq: Once | INTRAVENOUS | Status: AC
  Administered 2022-12-05: 150 mg via INTRAVENOUS
  Filled 2022-12-05: qty 83.34

## 2022-12-05 MED ORDER — CARVEDILOL 25 MG PO TABS
25.0000 mg | ORAL_TABLET | Freq: Two times a day (BID) | ORAL | Status: DC
  Administered 2022-12-05: 25 mg via ORAL
  Filled 2022-12-05: qty 1

## 2022-12-05 MED ORDER — AMIODARONE HCL IN DEXTROSE 360-4.14 MG/200ML-% IV SOLN
60.0000 mg/h | INTRAVENOUS | Status: DC
  Administered 2022-12-05 (×2): 60 mg/h via INTRAVENOUS
  Filled 2022-12-05: qty 200

## 2022-12-05 MED ORDER — FUROSEMIDE 40 MG PO TABS
40.0000 mg | ORAL_TABLET | Freq: Every day | ORAL | Status: DC
  Administered 2022-12-06 – 2022-12-08 (×3): 40 mg via ORAL
  Filled 2022-12-05 (×3): qty 1

## 2022-12-05 MED ORDER — HYDRALAZINE HCL 50 MG PO TABS
50.0000 mg | ORAL_TABLET | Freq: Three times a day (TID) | ORAL | Status: DC
  Administered 2022-12-05 – 2022-12-08 (×9): 50 mg via ORAL
  Filled 2022-12-05 (×9): qty 1

## 2022-12-05 MED ORDER — AMIODARONE HCL IN DEXTROSE 360-4.14 MG/200ML-% IV SOLN
60.0000 mg/h | INTRAVENOUS | Status: DC
  Administered 2022-12-05: 60 mg/h via INTRAVENOUS
  Administered 2022-12-05: 30 mg/h via INTRAVENOUS
  Filled 2022-12-05 (×6): qty 200

## 2022-12-05 MED ORDER — METOPROLOL TARTRATE 5 MG/5ML IV SOLN
INTRAVENOUS | Status: AC
  Filled 2022-12-05: qty 5

## 2022-12-05 MED ORDER — METOPROLOL TARTRATE 5 MG/5ML IV SOLN
5.0000 mg | Freq: Once | INTRAVENOUS | Status: AC
  Administered 2022-12-05: 5 mg via INTRAVENOUS

## 2022-12-05 MED ORDER — METOPROLOL TARTRATE 5 MG/5ML IV SOLN
5.0000 mg | INTRAVENOUS | Status: DC
  Administered 2022-12-05 – 2022-12-06 (×4): 5 mg via INTRAVENOUS
  Filled 2022-12-05 (×4): qty 5

## 2022-12-05 NOTE — Progress Notes (Signed)
Central tele called to notify this RN that pt had suddenly gone into rapid Afib with RVR. This RN immediately went to pt's room, HR ranging from 150's to 170's. Dr. Raelyn Mora was notified. Orders for EKG and Lopressor. Dr. Sloan Leiter to the bedside. Lopressor had no effect. Amiodarone bolus and drip were ordered. After 2 hrs, pt had barely come down. Cardiology consulted and came to bedside. Order for additional Amiodarone bolus and scheduled Lopressor.  Meanwhile, multiple family members called this RN to ask about pt's state. There was some miscommunication between family and it was thought that she was in her "final moments" and would be "out the door in 5 minutes." Palliative care has been consulted and pt did become a DNR today. Pt's daughter and POA, Lennie Muckle has requested for information to only be given to herself and Nigel Mormon, the pt's sister. [name] was given a password sheet and expressed understanding of giving pt password to only those who may get information.   Justice Rocher, RN

## 2022-12-05 NOTE — Progress Notes (Signed)
PROGRESS NOTE    Jillian [name]  OZH:[id number] DOB: 12-15-1935 DOA: 11/28/2022 PCP: [name], MD    Brief Narrative:  87 year old with history of CKD stage IV, chronic diastolic heart failure, stroke, hypertension, paroxysmal A-fib on Eliquis presented to the emergency room from home on 1/14 with respiratory distress.  In the emergency room she was tachypneic but afebrile.  Initially placed on BiPAP and treated with bronchodilators and steroids.  Chest x-ray consistent with fluid overload.  She was also found with rapid A-fib, she was cardioverted in the emergency room and rate was controlled.  Subsequently after cardioversion she went into hypotensive episode and was started with peripheral Levophed and transferred to ICU on BiPAP and peripheral Levophed.  1/14 admitted to [facility] resp distress on bipap; afib rvr cardioverted then hypotensive and bradycardic placed on levo 1/15 Started Tamiflu for positive flu PCR.  Did not respond to IV Lasix 1/16 started IV fluids for AKI 1/18, intermittently short of breath with pulmonary edema on chest x-ray.  Using BiPAP as needed.  Nephrology consulted. 1/20, remains debilitated very frail.  Renal function somewhat improving.  Persistently hypertensive. 1/21, developed rapid A-fib.  Tachypnea.  Started on amiodarone.  Assessment & Plan:   Acute hypoxemic and hypercapnic respiratory failure: In the setting of influenza A infection and fluid overload. Continue bronchodilator therapy, taper off IV steroids, continue inhalational steroids, scheduled and as needed bronchodilators, deep breathing exercises, incentive spirometry, chest physiotherapy asthma exacerbation can tolerate. Tamiflu, renally dosed for 5 days.  Completed therapy. Blood gas analysis fairly normal today on supplemental oxygen.  Repeat chest x-ray showed pulmonary edema. Supplemental oxygen to keep saturations more than 90%. BiPAP as needed for respiratory distress.  Out of bed.   Start working with PT OT.   Acute kidney injury on chronic kidney disease stage IV: Known baseline creatinine of 2.2.  Urine output 2500 mL last 24 hours.  On IV Lasix.  Changed to oral Lasix starting tomorrow.   Urine output is adequate and responding to diuresis however, clinical status remains very poor. Followed by nephrology.  Not a dialysis candidate.  Essential hypertension: Uncontrolled now. Could not tolerate beta-blockers due to bradycardia after cardioversion.  Will add for rate control today. Hydralazine, increase to 100 mg 3 times daily today.  amlodipine 5 mg.   Add Coreg 25 mg twice daily.  Recurrent A-fib with RVR: Cardioversion in the emergency room followed by bradycardia and hypotension.  1/21, after maintaining sinus rhythm patient developed rapid A-fib today.   Metoprolol 5 mg once, no response.  Started on amiodarone loading and infusion.   Start Coreg 25 mg twice daily.  Will discuss with cardiology.    Acute metabolic encephalopathy due to hypercapnia: Mental status gradually normalizing.  More awake today but remains very debilitated.  Chronic anemia and thrombocytopenia: Stable.  Goal of care discussion: Every day call to family, patient's granddaughter [name], called and updated. Patient remains in very poor clinical status with unknown recovery.  Now developed rapid A-fib. Discussed CODE STATUS and suggested to consider DNR with full scope of treatment, however family did not agree. They are agreeable to discuss with palliative care. Patient may benefit with hospice level of care.  Will continue conversation.  Currently remains full code.   DVT prophylaxis: apixaban (ELIQUIS) tablet 2.5 mg Start: 12/01/22 1015 apixaban (ELIQUIS) tablet 2.5 mg   Code Status: Full code Family Communication: Rhine Daughter [name] on the phone.  Disposition Plan: Status is: Inpatient Remains inpatient appropriate because:  Respiratory distress, renal functions abnormality.   Rapid A-fib.     Consultants:  Critical care Nephrology Palliative care Cardiology.  Procedures:  Cardioversion in the emergency room.  Antimicrobials:  Tamiflu, completed   Subjective:  Patient seen and examined.  Alert awake but lethargic.  She herself denies any complaints but she looks tachypneic.  Not very interactive.  I was called to the bedside because suddenly patient started having rapid A-fib with heart rate 170 and did not respond to metoprolol.  Started on amiodarone after loading dose, however heart rate still fluctuates up to 140.  Patient denies any chest pain.  Unable to take deep breaths.  Objective: Vitals:   12/05/22 0320 12/05/22 0734 12/05/22 0823 12/05/22 1150  BP: (!) 157/64  (!) 168/101 139/88  Pulse: 68 77 (!) 114 (!) 104  Resp: 19 20 (!) 24 (!) 22  Temp: 98.5 F (36.9 C)  98.3 F (36.8 C) (!) 97.5 F (36.4 C)  TempSrc: Axillary  Oral Oral  SpO2: 99% 99% 98% 96%  Weight:      Height:        Intake/Output Summary (Last 24 hours) at 12/05/2022 1216 Last data filed at 12/05/2022 1200 Gross per 24 hour  Intake 513.29 ml  Output 2750 ml  Net -2236.71 ml   Filed Weights   12/01/22 0500 12/02/22 0344 12/03/22 0500  Weight: 88.9 kg 88.9 kg 87.7 kg    Examination:  General: Sick looking.  Frail and debilitated.  Anxious.  Not taking deep breaths in.  Moderate distress on conversation. Cardiovascular: S1-S2 normal.  Irregularly irregular.  Tachycardic. Respiratory: Poor bilateral air entry.  Currently on 2 L oxygen. Gastrointestinal: Soft.  Nontender.  Bowel sound present. Ext: Bilateral pedal edema left more than right. Neuro: Awake alert but lethargic.  Follows commands.  Moves all extremities equally.  Data Reviewed: I have personally reviewed following labs and imaging studies  CBC: Recent Labs  Lab 11/29/22 0153 11/30/22 0320 11/30/22 1018 12/01/22 0801 12/01/22 1015 12/02/22 0449 12/03/22 1014  WBC 6.3 7.7  --  7.7  --  8.2  8.8  NEUTROABS  --   --   --   --   --   --  7.1  HGB 9.0* 9.2* 9.9* 9.3* 9.9* 9.0* 9.4*  HCT 31.3* 32.2* 29.0* 30.9* 29.0* 29.1* 29.8*  MCV 95.7 93.3  --  90.9  --  88.2 86.9  PLT 135* 142*  --  144*  --  140* 852*   Basic Metabolic Panel: Recent Labs  Lab 11/28/22 2351 11/29/22 0153 12/01/22 0801 12/01/22 1015 12/02/22 0449 12/03/22 1014 12/04/22 0802 12/05/22 0500  NA  --    < > 139 138 137 138 141 143  K  --    < > 4.7 4.8 4.7 4.5 4.3 3.9  CL  --    < > 103  --  102 102 102 102  CO2  --    < > 25  --  25 28 29  32  GLUCOSE  --    < > 100*  --  116* 105* 131* 85  BUN  --    < > 77*  --  81* 85* 83* 79*  CREATININE  --    < > 4.44*  --  4.42* 3.96* 3.65* 3.37*  CALCIUM  --    < > 8.0*  --  8.2* 8.3* 8.6* 8.6*  MG 2.3  --   --   --  2.8* 2.7*  --   --  PHOS  --   --   --   --   --  4.2 4.3 3.6   < > = values in this interval not displayed.   GFR: Estimated Creatinine Clearance: 12.5 mL/min (A) (by C-G formula based on SCr of 3.37 mg/dL (H)). Liver Function Tests: Recent Labs  Lab 11/28/22 1928 11/29/22 0153 11/30/22 0320 12/02/22 0449 12/03/22 1014 12/04/22 0802 12/05/22 0500  AST 71* 64* 67* 37  --   --   --   ALT 74* 70* 73* 61*  --   --   --   ALKPHOS 151* 136* 134* 113  --   --   --   BILITOT 0.7 0.5 0.6 0.5  --   --   --   PROT 7.1 6.4* 6.3* 6.0*  --   --   --   ALBUMIN 3.0* 2.7* 2.7* 2.4* 2.4* 2.5* 2.2*   No results for input(s): "LIPASE", "AMYLASE" in the last 168 hours. Recent Labs  Lab 11/28/22 2353  AMMONIA 19   Coagulation Profile: Recent Labs  Lab 11/28/22 2351  INR 1.5*   Cardiac Enzymes: No results for input(s): "CKTOTAL", "CKMB", "CKMBINDEX", "TROPONINI" in the last 168 hours. BNP (last 3 results) No results for input(s): "PROBNP" in the last 8760 hours. HbA1C: No results for input(s): "HGBA1C" in the last 72 hours.  CBG: Recent Labs  Lab 12/04/22 2038 12/04/22 2313 12/05/22 0323 12/05/22 0824 12/05/22 1140  GLUCAP 185* 138*  97 96 126*   Lipid Profile: No results for input(s): "CHOL", "HDL", "LDLCALC", "TRIG", "CHOLHDL", "LDLDIRECT" in the last 72 hours. Thyroid Function Tests: No results for input(s): "TSH", "T4TOTAL", "FREET4", "T3FREE", "THYROIDAB" in the last 72 hours. Anemia Panel: No results for input(s): "VITAMINB12", "FOLATE", "FERRITIN", "TIBC", "IRON", "RETICCTPCT" in the last 72 hours. Sepsis Labs: No results for input(s): "PROCALCITON", "LATICACIDVEN" in the last 168 hours.  Recent Results (from the past 240 hour(s))  MRSA Next Gen by PCR, Nasal     Status: None   Collection Time: 11/29/22 12:22 AM   Specimen: Nasal Mucosa; Nasal Swab  Result Value Ref Range Status   MRSA by PCR Next Gen NOT DETECTED NOT DETECTED Final    Comment: (NOTE) The GeneXpert MRSA Assay (FDA approved for NASAL specimens only), is one component of a comprehensive MRSA colonization surveillance program. It is not intended to diagnose MRSA infection nor to guide or monitor treatment for MRSA infections. Test performance is not FDA approved in patients less than 51 years old. Performed at [facility], [place] [address], [place], [place] [address]   Resp panel by RT-PCR (RSV, Flu A&B, Covid) Anterior Nasal Swab     Status: Abnormal   Collection Time: 11/29/22 12:02 PM   Specimen: Anterior Nasal Swab  Result Value Ref Range Status   SARS Coronavirus 2 by RT PCR NEGATIVE NEGATIVE Final    Comment: (NOTE) SARS-CoV-2 target nucleic acids are NOT DETECTED.  The SARS-CoV-2 RNA is generally detectable in upper respiratory specimens during the acute phase of infection. The lowest concentration of SARS-CoV-2 viral copies this assay can detect is 138 copies/mL. A negative result does not preclude SARS-Cov-2 infection and should not be used as the sole basis for treatment or other patient management decisions. A negative result may occur with  improper specimen collection/handling, submission of specimen other than  nasopharyngeal swab, presence of viral mutation(s) within the areas targeted by this assay, and inadequate number of viral copies(<138 copies/mL). A negative result must be combined  with clinical observations, patient history, and epidemiological information. The expected result is Negative.  Fact Sheet for Patients:  EntrepreneurPulse.com.au  Fact Sheet for Healthcare Providers:  IncredibleEmployment.be  This test is no t yet approved or cleared by the [place] FDA and  has been authorized for detection and/or diagnosis of SARS-CoV-2 by FDA under an Emergency Use Authorization (EUA). This EUA will remain  in effect (meaning this test can be used) for the duration of the COVID-19 declaration under Section 564(b)(1) of the Act, 21 U.S.C.section 360bbb-3(b)(1), unless the authorization is terminated  or revoked sooner.       Influenza A by PCR POSITIVE (A) NEGATIVE Final   Influenza B by PCR NEGATIVE NEGATIVE Final    Comment: (NOTE) The Xpert Xpress SARS-CoV-2/FLU/RSV plus assay is intended as an aid in the diagnosis of influenza from Nasopharyngeal swab specimens and should not be used as a sole basis for treatment. Nasal washings and aspirates are unacceptable for Xpert Xpress SARS-CoV-2/FLU/RSV testing.  Fact Sheet for Patients: EntrepreneurPulse.com.au  Fact Sheet for Healthcare Providers: IncredibleEmployment.be  This test is not yet approved or cleared by the [place] FDA and has been authorized for detection and/or diagnosis of SARS-CoV-2 by FDA under an Emergency Use Authorization (EUA). This EUA will remain in effect (meaning this test can be used) for the duration of the COVID-19 declaration under Section 564(b)(1) of the Act, 21 U.S.C. section 360bbb-3(b)(1), unless the authorization is terminated or revoked.     Resp Syncytial Virus by PCR NEGATIVE NEGATIVE Final    Comment:  (NOTE) Fact Sheet for Patients: EntrepreneurPulse.com.au  Fact Sheet for Healthcare Providers: IncredibleEmployment.be  This test is not yet approved or cleared by the [place] FDA and has been authorized for detection and/or diagnosis of SARS-CoV-2 by FDA under an Emergency Use Authorization (EUA). This EUA will remain in effect (meaning this test can be used) for the duration of the COVID-19 declaration under Section 564(b)(1) of the Act, 21 U.S.C. section 360bbb-3(b)(1), unless the authorization is terminated or revoked.  Performed at [facility], [place] [address], [place], [place] [address]          Radiology Studies: DG CHEST PORT 1 VIEW  Result Date: 12/05/2022 CLINICAL DATA:  Shortness of breath EXAM: PORTABLE CHEST 1 VIEW COMPARISON:  Chest radiograph 12/02/2022 FINDINGS: Monitoring leads overlie the patient. Stable cardiomegaly. Tortuosity of the thoracic aorta. Probable small bilateral layering pleural effusions. Interval increase in airspace opacities throughout the right lung. Similar appearance of the left lung. No pneumothorax. IMPRESSION: 1. Interval increase in airspace opacities throughout the right lung which may represent edema or infection. 2. Small bilateral pleural effusions. Electronically Signed   By: Lovey Newcomer M.D.   On: 12/05/2022 11:31        Scheduled Meds:  amLODipine  5 mg Oral Daily   apixaban  2.5 mg Oral BID   arformoterol  15 mcg Nebulization BID   budesonide (PULMICORT) nebulizer solution  0.5 mg Nebulization BID   carvedilol  25 mg Oral BID WC   Chlorhexidine Gluconate Cloth  6 each Topical Q0600   [START ON 12/06/2022] furosemide  40 mg Oral Daily   hydrALAZINE  100 mg Oral Q8H   insulin aspart  0-15 Units Subcutaneous Q4H   methylPREDNISolone (SOLU-MEDROL) injection  40 mg Intravenous Daily   mouth rinse  15 mL Mouth Rinse 4 times per day   polyethylene glycol  17 g Oral Daily    revefenacin  175 mcg  Nebulization Daily   sodium chloride flush  3 mL Intravenous Q12H   Continuous Infusions:  sodium chloride Stopped (12/01/22 1343)   amiodarone 60 mg/hr (12/05/22 1200)   Followed by   amiodarone       LOS: 7 days    Time spent: 35 minutes    Barb Merino, MD [facility] Pager [phone]

## 2022-12-05 NOTE — Progress Notes (Signed)
Placed patient on bipap for the night  

## 2022-12-05 NOTE — Progress Notes (Signed)
Patient ID: Annel Zunker, female   DOB: 1936/02/18, 87 y.o.   MRN: [id number] S: No events overnight, however HR is now in the 130's-140's O:BP (!) 168/101 (BP Location: Left Arm)   Pulse (!) 114   Temp 98.3 F (36.8 C) (Oral)   Resp (!) 24   Ht 5\' 1"  (1.549 m)   Wt 87.7 kg   SpO2 98%   BMI 36.53 kg/m   Intake/Output Summary (Last 24 hours) at 12/05/2022 1022 Last data filed at 12/05/2022 0915 Gross per 24 hour  Intake 363 ml  Output 2400 ml  Net -2037 ml   Intake/Output: I/O last 3 completed shifts: In: 416 [P.O.:410; I.V.:6] Out: 3800 [Urine:3800]  Intake/Output this shift:  Total I/O In: -  Out: 400 [Urine:400] Weight change:  Gen: chronically ill-appearing CVS: tachy in the 130's-140's Resp: scattered rhonchi and exp wheezes Abd: +BS, soft, TN/ND Ext: trace presacral edema  Recent Labs  Lab 11/28/22 1928 11/28/22 2007 11/29/22 0153 11/30/22 0038 11/30/22 0320 11/30/22 1018 12/01/22 0801 12/01/22 1015 12/02/22 0449 12/03/22 1014 12/04/22 0802 12/05/22 0500  NA 139   < > 140 138 138 138 139 138 137 138 141 143   K 4.1   < > 4.3 4.6 4.7 4.5 4.7 4.8 4.7 4.5 4.3 3.9  CL 102   < > 104 101 102  --  103  --  102 102 102 102  CO2 25  --  23 26 25   --  25  --  25 28 29  32  GLUCOSE 124*   < > 126* 119* 113*  --  100*  --  116* 105* 131* 85  BUN 53*   < > 54* 68* 69*  --  77*  --  81* 85* 83* 79*  CREATININE 4.11*   < > 4.04* 4.42* 4.46*  --  4.44*  --  4.42* 3.96* 3.65* 3.37*  ALBUMIN 3.0*  --  2.7*  --  2.7*  --   --   --  2.4* 2.4* 2.5* 2.2*  CALCIUM 8.2*  --  8.1* 7.8* 7.9*  --  8.0*  --  8.2* 8.3* 8.6* 8.6*  PHOS  --   --   --   --   --   --   --   --   --  4.2 4.3 3.6  AST 71*  --  64*  --  67*  --   --   --  37  --   --   --   ALT 74*  --  70*  --  73*  --   --   --  61*  --   --   --    < > = values in this interval not displayed.   Liver Function Tests: Recent Labs  Lab 11/29/22 0153 11/30/22 0320 12/02/22 0449 12/03/22 1014 12/04/22 0802  12/05/22 0500  AST 64* 67* 37  --   --   --   ALT 70* 73* 61*  --   --   --   ALKPHOS 136* 134* 113  --   --   --   BILITOT 0.5 0.6 0.5  --   --   --   PROT 6.4* 6.3* 6.0*  --   --   --   ALBUMIN 2.7* 2.7* 2.4* 2.4* 2.5* 2.2*   No results for input(s): "LIPASE", "AMYLASE" in the last 168 hours. Recent Labs  Lab 11/28/22 2353  AMMONIA 19   CBC: Recent Labs  Lab  11/29/22 0153 11/30/22 0320 11/30/22 1018 12/01/22 0801 12/01/22 1015 12/02/22 0449 12/03/22 1014  WBC 6.3 7.7  --  7.7  --  8.2 8.8  NEUTROABS  --   --   --   --   --   --  7.1  HGB 9.0* 9.2*   < > 9.3* 9.9* 9.0* 9.4*  HCT 31.3* 32.2*   < > 30.9* 29.0* 29.1* 29.8*  MCV 95.7 93.3  --  90.9  --  88.2 86.9  PLT 135* 142*  --  144*  --  140* 137*   < > = values in this interval not displayed.   Cardiac Enzymes: No results for input(s): "CKTOTAL", "CKMB", "CKMBINDEX", "TROPONINI" in the last 168 hours. CBG: Recent Labs  Lab 12/04/22 1631 12/04/22 2038 12/04/22 2313 12/05/22 0323 12/05/22 0824  GLUCAP 211* 185* 138* 97 96    Iron Studies: No results for input(s): "IRON", "TIBC", "TRANSFERRIN", "FERRITIN" in the last 72 hours. Studies/Results: No results found.  amLODipine  5 mg Oral Daily   apixaban  2.5 mg Oral BID   arformoterol  15 mcg Nebulization BID   budesonide (PULMICORT) nebulizer solution  0.5 mg Nebulization BID   Chlorhexidine Gluconate Cloth  6 each Topical Q0600   furosemide  60 mg Intravenous Daily   hydrALAZINE  100 mg Oral Q8H   insulin aspart  0-15 Units Subcutaneous Q4H   methylPREDNISolone (SOLU-MEDROL) injection  40 mg Intravenous Daily   mouth rinse  15 mL Mouth Rinse 4 times per day   polyethylene glycol  17 g Oral Daily   revefenacin  175 mcg Nebulization Daily   sodium chloride flush  3 mL Intravenous Q12H    BMET    Component Value Date/Time   NA 143 12/05/2022 0500   NA 145 (H) 03/08/2022 1414   K 3.9 12/05/2022 0500   CL 102 12/05/2022 0500   CO2 32 12/05/2022 0500    GLUCOSE 85 12/05/2022 0500   BUN 79 (H) 12/05/2022 0500   BUN 39 (H) 03/08/2022 1414   CREATININE 3.37 (H) 12/05/2022 0500   CALCIUM 8.6 (L) 12/05/2022 0500   GFRNONAA 13 (L) 12/05/2022 0500   GFRAA 23 (L) 01/05/2021 1018   CBC    Component Value Date/Time   WBC 8.8 12/03/2022 1014   RBC 3.43 (L) 12/03/2022 1014   HGB 9.4 (L) 12/03/2022 1014   HGB 10.2 (L) 03/08/2022 1414   HCT 29.8 (L) 12/03/2022 1014   HCT 31.7 (L) 03/08/2022 1414   PLT 137 (L) 12/03/2022 1014   PLT 225 03/08/2022 1414   MCV 86.9 12/03/2022 1014   MCV 86 03/08/2022 1414   MCH 27.4 12/03/2022 1014   MCHC 31.5 12/03/2022 1014   RDW 15.7 (H) 12/03/2022 1014   RDW 13.0 03/08/2022 1414   LYMPHSABS 0.9 12/03/2022 1014   LYMPHSABS 2.3 03/08/2022 1414   MONOABS 0.6 12/03/2022 1014   EOSABS 0.0 12/03/2022 1014   EOSABS 0.1 03/08/2022 1414   BASOSABS 0.0 12/03/2022 1014   BASOSABS 0.0 03/08/2022 1414    Assessment/Plan:  AKI/CKD stage IV - likely ischemic ATN following A fib with RVR and hypotension requiring pressors as well as acute on chronic diastolic CHF.  Pt was given IV lasix 120 mg initially but then held after she developed AKI.  No uremic symptoms.  UOP had dropped off but improved with IV lasix 60 mg.  Likely cardiorenal component as well.  She is responding to IV lasix 60 mg daily with improvement  of UOP and SCr.  Will stop IV lasix 60 mg daily and transition to po lasix 40 mg daily follow.  Pt is a poor candidate for dialysis given her advanced age, multiple co-morbidities (including cognitive impairment), and poor functional and nutritional status.  This was discussed with her primary Nephrologist, Dr. Joylene Grapes who did discuss this with her daughter in the past.  If she doesn't improve, would recommend palliative care consult to help set goals/limits of care.   Acute respiratory failure with hypercarbia in setting of influenza A - was on BiPAP as needed.  On solumedrol, brovana, pulmicort and Yuperli per  PCCM Influenza A - tamiflu for 5 days A fib with RVR s/p cardioversion - on Eliquis Acute on chronic diastolic CHF - likely due to A fib with RVR and AKI.   Acute encephalopathy - due to hyeprcarbia.  Improving. Elevated LFT's - likely shock liver vs congestion.  Anemia of CKD stage IV - transfuse prn. Disposition - overall prognosis is poor and not a candidate for dialysis.  Recommend Palliative care consult to help set goals/limits of care.  Pt does have underlying cognitive impairment.  Donetta Potts, MD [facility]

## 2022-12-05 NOTE — Consult Note (Signed)
Consultation Note Date: 12/05/2022   Patient Name: Jillian [name]  DOB: 1936-02-16  MRN: [id number]  Age / Sex: 87 y.o., female  PCP: [name], MD Referring Physician: Barb Merino, MD  Reason for Consultation: Establishing goals of care  HPI/Patient Profile: 87 y.o. female  with past medical history of CKD stage 4, HFpEF, CVA, paroxysmal atrial fibrillation on Eliquis admitted on 11/28/2022 with respiratory distress related to atrial fibrillation RVR with heart failure exacerbation and + influenza A requiring BiPAP and vasopressor support. Ongoing concern for worsening renal function and overall debility.   Clinical Assessment and Goals of Care: Consult received and thorough chart review completed. I met with Jillian [name] but no family at bedside. Jillian [name] is very lethargic but does awaken and answer questions. She is very fatigued and it seems to take a lot of effort to even answer brief questions. She could not tell me anything about her health situation or what the doctors have told her. She moans at times but denies pain. I let her go back to sleep.   I called and spoke with daughter, [name]. Jillian [name] quickly becomes tearful and adds her Jillian [name]'s sister, Jillian Journey - "Jillian [name]," to the call. We discussed [name]'s stroke a couple months ago and she was doing well but needing more help at home and not staying alone. This admission has been significant. We discussed the importance of having a conversation to know [name]'s values and the importance of quality of life. I also explained that we need to know how to best take care of her if she continues to decline. They tell me that they know that she would "want to live." Jillian [name] also shares that she knows that her sister is prepared and "would not fight it when her time comes." They share that she is a woman of faith. We discussed this in respect to code status.  We discussed resuscitation and what this means and looks like. After discussion and tears they both agree that she would not want resuscitation and agree with DNR. I reassure them that we will continue to support her to help treat to optimize her health the best we are able. We did discuss that we will continue conversation and will need to potentially make more decisions based on her progress. They know that her overall prognosis is concerning.   All questions/concerns addressed. Emotional support provide.d   Primary Decision Maker NEXT OF KIN daughter Jillian [name]    SUMMARY OF RECOMMENDATIONS   - DNR decided - Time for outcomes - Ongoing goals of care conversation  Code Status/Advance Care Planning: DNR   Symptom Management:  Per attending.   Prognosis:  Overall prognosis poor.   Discharge Planning: To Be Determined      Primary Diagnoses: Present on Admission:  Acute respiratory failure with hypercapnia ([place])   I have reviewed the medical record, interviewed the patient and family, and examined the patient. The following aspects are pertinent.  Past Medical History:  Diagnosis Date   Acute CVA (  cerebrovascular accident) ([place]) 07/12/2020   Hypertension    Renal disorder    CKD Stage IV   Stroke [facility])    Social History   Socioeconomic History   Marital status: Single    Spouse name: Not on file   Number of children: Not on file   Years of education: Not on file   Highest education level: Not on file  Occupational History   Not on file  Tobacco Use   Smoking status: Never   Smokeless tobacco: Never  Substance and Sexual Activity   Alcohol use: Not on file   Drug use: Not on file   Sexual activity: Not on file  Other Topics Concern   Not on file  Social History Narrative   Not on file   Social Determinants of Health   Financial Resource Strain: Not on file  Food Insecurity: Not on file  Transportation Needs: Not on file  Physical Activity: Not on file   Stress: Not on file  Social Connections: Not on file   Family History  Problem Relation Age of Onset   CAD Mother    Breast cancer Mother    Scheduled Meds:  amLODipine  5 mg Oral Daily   apixaban  2.5 mg Oral BID   arformoterol  15 mcg Nebulization BID   budesonide (PULMICORT) nebulizer solution  0.5 mg Nebulization BID   Chlorhexidine Gluconate Cloth  6 each Topical Q0600   [START ON 12/06/2022] furosemide  40 mg Oral Daily   hydrALAZINE  100 mg Oral Q8H   insulin aspart  0-15 Units Subcutaneous Q4H   methylPREDNISolone (SOLU-MEDROL) injection  40 mg Intravenous Daily   mouth rinse  15 mL Mouth Rinse 4 times per day   polyethylene glycol  17 g Oral Daily   revefenacin  175 mcg Nebulization Daily   sodium chloride flush  3 mL Intravenous Q12H   Continuous Infusions:  sodium chloride Stopped (12/01/22 1343)   amiodarone 60 mg/hr (12/05/22 0941)   Followed by   amiodarone     PRN Meds:.hydrALAZINE, ipratropium-albuterol, lip balm, mouth rinse Allergies  Allergen Reactions   Other Other (See Comments)    "Tapes tears off my skin"   Latex Other (See Comments)    "Blisters my skin and tears it off"- skin burns, too   Penicillins Nausea Only   Tape Other (See Comments)    "Tapes tears off my skin"   Aspirin Other (See Comments)    Irritates the stomach   Atorvastatin Other (See Comments)    Caused fatigue   Diltiazem Hcl Nausea And Vomiting   Review of Systems  Constitutional:  Positive for activity change, appetite change and fatigue.  Neurological:  Positive for weakness.    Physical Exam Vitals and nursing note reviewed.  Cardiovascular:     Rate and Rhythm: Tachycardia present. Rhythm irregularly irregular.  Pulmonary:     Effort: Tachypnea present. No accessory muscle usage or respiratory distress.  Abdominal:     General: Abdomen is flat.     Palpations: Abdomen is soft.  Neurological:     Mental Status: She is oriented to person, place, and time. She is  lethargic.     Comments: Limited insight into current health condition     Vital Signs: BP (!) 168/101 (BP Location: Left Arm)   Pulse (!) 114   Temp 98.3 F (36.8 C) (Oral)   Resp (!) 24   Ht 5\' 1"  (1.549 m)   Wt 87.7 kg  SpO2 98%   BMI 36.53 kg/m  Pain Scale: 0-10   Pain Score: 0-No pain   SpO2: SpO2: 98 % O2 Device:SpO2: 98 % O2 Flow Rate: .O2 Flow Rate (L/min): 2 L/min  IO: Intake/output summary:  Intake/Output Summary (Last 24 hours) at 12/05/2022 1112 Last data filed at 12/05/2022 0915 Gross per 24 hour  Intake 363 ml  Output 2400 ml  Net -2037 ml    LBM: Last BM Date : 12/03/22 Baseline Weight: Weight: 86.1 kg Most recent weight: Weight: 87.7 kg     Palliative Assessment/Data:     Time In: 1345  Time Total: 75 min  Greater than 50%  of this time was spent counseling and coordinating care related to the above assessment and plan.  Signed by: Vinie Sill, NP Palliative Medicine Team Pager # [phone] (M-F 8a-5p) Team Phone # [phone] (Nights/Weekends)

## 2022-12-05 NOTE — Consult Note (Addendum)
Cardiology Consultation   Jillian [name] ID: Jillian [name] MRN: [id number]; DOB: 1936/03/09  Admit date: 11/28/2022 Date of Consult: 12/05/2022  PCP:  [name], [place] Providers Cardiologist:  Dorris Carnes, MD        Jillian [name] Profile:   Jillian [name] is a 87 y.o. female with a hx of CKD stage IV, chronic diastolic heart failure, stroke, hypertension, paroxysmal A-fib on Eliquis who is being seen 12/05/2022 for Jillian evaluation of afib with RVR at Jillian request of Dr. Sloan Leiter.  History of Present Illness:   Jillian [name] presented to Jillian ED on 1/14 with respiratory distress. Jillian [name] initially required BiPAP support and also received bronchodilators/steroids. CXR was concerning for volume overload. ECG/telemetry [name] afib with RVR and Jillian [name] was cardioverted in Jillian ED. Following cardioversion, Jillian [name] became hypotensive and was initiated on Levophed, transferred to Jillian ICU. Additional workup found Jillian [name] positive for influenza and Jillian [name] was started on Tamiflu. Appears that Jillian [name] received IV fluids following hypotensive episode but ultimately was found to have pulmonary edema per 1/18 CXR. PRN BiPAP was continued.   Today Jillian [name] reports that Jillian [name] feels extremely weak and tired but denies chest pain or palpitations with active afib/RVR. Jillian [name] continues to have dyspnea but says that this is improved from admission.   Past Medical History:  Diagnosis Date   Acute CVA (cerebrovascular accident) ([place]) 07/12/2020   Hypertension    Renal disorder    CKD Stage IV   Stroke [facility])     Past Surgical History:  Procedure Laterality Date   CARDIAC CATHETERIZATION  2009     Home Medications:  Prior to Admission medications   Medication Sig Start Date End Date Taking? Authorizing Provider  apixaban (ELIQUIS) 2.5 MG TABS tablet Take 1 tablet (2.5 mg total) by mouth 2 (two) times daily. 11/11/22  Yes Freeman Caldron M, PA-C  carvedilol (COREG) 25 MG tablet Take 1 tablet (25 mg total) by  mouth 2 (two) times daily. 11/11/22 02/09/23 Yes McClung, Dionne Bucy, PA-C  cholecalciferol (VITAMIN D3) 25 MCG (1000 UNIT) tablet Take 1,000 Units by mouth daily. 10/15/22  Yes [provider]  clotrimazole-betamethasone (LOTRISONE) cream Apply topically 2 (two) times daily. 11/11/22 11/11/23 Yes McClung, Dionne Bucy, PA-C  diphenhydrAMINE (BENADRYL) 25 mg capsule Take 25 mg by mouth every 6 (six) hours as needed for itching or allergies. 10/15/22  Yes [provider]  Ferrous Sulfate (IRON PO) Take 1 tablet by mouth daily.   Yes [provider]  furosemide (LASIX) 20 MG tablet Take 3 tablets (60 mg total) by mouth in Jillian morning. 11/11/22  Yes Freeman Caldron M, PA-C  rosuvastatin (CRESTOR) 10 MG tablet Take 1 tablet (10 mg total) by mouth daily. 11/11/22  Yes Freeman Caldron M, PA-C  sennosides-docusate sodium (SENOKOT-S) 8.6-50 MG tablet Take 3 tablets by mouth at bedtime. Jillian [name] taking differently: Take 3 tablets by mouth at bedtime as needed for constipation. 10/01/21  Yes [name], MD  VITAMIN D PO Take 1 capsule by mouth every 7 (seven) days.   Yes [provider]  chlorthalidone (HYGROTON) 25 MG tablet Take 12.5 mg by mouth daily. Jillian [name] not taking: Reported on 11/29/2022 08/09/22   [provider]  hydrALAZINE (APRESOLINE) 50 MG tablet Take 1 tablet (50 mg total) by mouth 3 (three) times daily. Jillian [name] not taking: Reported on 11/29/2022 11/11/22 12/11/22  Argentina Donovan, PA-C    Inpatient Medications: Scheduled Meds:  amLODipine  5 mg Oral Daily  apixaban  2.5 mg Oral BID   arformoterol  15 mcg Nebulization BID   budesonide (PULMICORT) nebulizer solution  0.5 mg Nebulization BID   Chlorhexidine Gluconate Cloth  6 each Topical Q0600   [START ON 12/06/2022] furosemide  40 mg Oral Daily   hydrALAZINE  50 mg Oral Q8H   insulin aspart  0-15 Units Subcutaneous Q4H   methylPREDNISolone (SOLU-MEDROL) injection  40 mg Intravenous Daily    metoprolol tartrate  5 mg Intravenous 6 X Daily   mouth rinse  15 mL Mouth Rinse 4 times per day   polyethylene glycol  17 g Oral Daily   revefenacin  175 mcg Nebulization Daily   sodium chloride flush  3 mL Intravenous Q12H   Continuous Infusions:  sodium chloride Stopped (12/01/22 1343)   amiodarone 60 mg/hr (12/05/22 1234)   Followed by   amiodarone     amiodarone     PRN Meds: levalbuterol, lip balm, mouth rinse  Allergies:    Allergies  Allergen Reactions   Other Other (See Comments)    "Tapes tears off my skin"   Latex Other (See Comments)    "Blisters my skin and tears it off"- skin burns, too   Penicillins Nausea Only   Tape Other (See Comments)    "Tapes tears off my skin"   Aspirin Other (See Comments)    Irritates Jillian stomach   Atorvastatin Other (See Comments)    Caused fatigue   Diltiazem Hcl Nausea And Vomiting    Social History:   Social History   Socioeconomic History   Marital status: Single    Spouse name: Not on file   Number of children: Not on file   Years of education: Not on file   Highest education level: Not on file  Occupational History   Not on file  Tobacco Use   Smoking status: Never   Smokeless tobacco: Never  Substance and Sexual Activity   Alcohol use: Not on file   Drug use: Not on file   Sexual activity: Not on file  Other Topics Concern   Not on file  Social History Narrative   Not on file   Social Determinants of Health   Financial Resource Strain: Not on file  Food Insecurity: Not on file  Transportation Needs: Not on file  Physical Activity: Not on file  Stress: Not on file  Social Connections: Not on file  Intimate Partner Violence: Not on file    Family History:    Family History  Problem Relation Age of Onset   CAD Mother    Breast cancer Mother      ROS:  Please see Jillian history of present illness.   All other ROS reviewed and negative.     Physical Exam/Data:   Vitals:   12/05/22 0320 12/05/22  0734 12/05/22 0823 12/05/22 1150  BP: (!) 157/64  (!) 168/101 139/88  Pulse: 68 77 (!) 114 (!) 104  Resp: 19 20 (!) 24 (!) 22  Temp: 98.5 F (36.9 C)  98.3 F (36.8 C) (!) 97.5 F (36.4 C)  TempSrc: Axillary  Oral Oral  SpO2: 99% 99% 98% 96%  Weight:      Height:        Intake/Output Summary (Last 24 hours) at 12/05/2022 1345 Last data filed at 12/05/2022 1200 Gross per 24 hour  Intake 513.29 ml  Output 2750 ml  Net -2236.71 ml      12/03/2022    5:00 AM 12/02/2022  3:44 AM 12/01/2022    5:00 AM  Last 3 Weights  Weight (lbs) 193 lb 5.5 oz 195 lb 15.8 oz 195 lb 15.8 oz  Weight (kg) 87.7 kg 88.9 kg 88.9 kg     Body mass index is 36.53 kg/m.  General:  Jillian [name] is tired and ill appearing HEENT: normal Neck: no JVD Vascular: No carotid bruits; Distal pulses 2+ bilaterally Cardiac:  normal S1, S2; rapid and irregularly irregular; no murmur Lungs:  diffusely diminished with generalized end-expiratory wheezing Abd: soft, nontender, no hepatomegaly  Ext: no edema Musculoskeletal:  No deformities, BUE and BLE strength normal and equal Skin: warm and dry  Neuro:  CNs 2-12 intact, no focal abnormalities [name] Psych:  Normal affect   EKG:  Jillian EKG was personally reviewed and demonstrates:  atrial fibrillation with RVR Telemetry:  Telemetry was personally reviewed and demonstrates:  sinus rhythm this morning until about 8:15am when Jillian [name] had onset of afib with RVR.  Relevant CV Studies:  11/29/22 TTE  IMPRESSIONS     1. Left ventricular ejection fraction, by estimation, is 55%. Jillian left  ventricle has normal function. Jillian left ventricle has no regional wall  motion abnormalities. Left ventricular diastolic parameters are  indeterminate.   2. Right ventricular systolic function is normal. Jillian right ventricular  size is normal.   3. Left atrial size was mildly dilated.   4. Jillian mitral valve is abnormal. Moderate mitral valve regurgitation. No  evidence of mitral  stenosis.   5. Jillian aortic valve is tricuspid. There is moderate calcification of Jillian  aortic valve. There is moderate thickening of Jillian aortic valve. Aortic  valve regurgitation is not visualized. Aortic valve sclerosis is present,  with no evidence of aortic valve  stenosis.   6. Jillian inferior vena cava is dilated in size with >50% respiratory  variability, suggesting right atrial pressure of 8 mmHg.   FINDINGS   Left Ventricle: Left ventricular ejection fraction, by estimation, is  55%. Jillian left ventricle has normal function. Jillian left ventricle has no  regional wall motion abnormalities. Definity contrast agent was given IV  to delineate Jillian left ventricular  endocardial borders. Jillian left ventricular internal cavity size was normal  in size. There is no left ventricular hypertrophy. Left ventricular  diastolic parameters are indeterminate.   Right Ventricle: Jillian right ventricular size is normal. No increase in  right ventricular wall thickness. Right ventricular systolic function is  normal.   Left Atrium: Left atrial size was mildly dilated.   Right Atrium: Right atrial size was normal in size.   Pericardium: There is no evidence of pericardial effusion.   Mitral Valve: Jillian mitral valve is abnormal. There is mild thickening of  Jillian mitral valve leaflet(s). There is mild calcification of Jillian mitral  valve leaflet(s). Moderate mitral valve regurgitation. No evidence of  mitral valve stenosis.   Tricuspid Valve: Jillian tricuspid valve is normal in structure. Tricuspid  valve regurgitation is mild . No evidence of tricuspid stenosis.   Aortic Valve: Jillian aortic valve is tricuspid. There is moderate  calcification of Jillian aortic valve. There is moderate thickening of Jillian  aortic valve. Aortic valve regurgitation is not visualized. Aortic valve  sclerosis is present, with no evidence of  aortic valve stenosis.   Pulmonic Valve: Jillian pulmonic valve was normal in structure. Pulmonic  valve  regurgitation is not visualized. No evidence of pulmonic stenosis.   Aorta: Jillian aortic root is normal in size and structure.   Venous:  Jillian inferior vena cava is dilated in size with greater than 50%  respiratory variability, suggesting right atrial pressure of 8 mmHg.   IAS/Shunts: No atrial level shunt detected by color flow Doppler.   Laboratory Data:  High Sensitivity Troponin:   Recent Labs  Lab 11/28/22 1928 11/28/22 2351  TROPONINIHS 101* 94*     Chemistry Recent Labs  Lab 11/28/22 2351 11/29/22 0153 12/02/22 0449 12/03/22 1014 12/04/22 0802 12/05/22 0500  NA  --    < > 137 138 141 143  K  --    < > 4.7 4.5 4.3 3.9  CL  --    < > 102 102 102 102  CO2  --    < > 25 28 29  32  GLUCOSE  --    < > 116* 105* 131* 85  BUN  --    < > 81* 85* 83* 79*  CREATININE  --    < > 4.42* 3.96* 3.65* 3.37*  CALCIUM  --    < > 8.2* 8.3* 8.6* 8.6*  MG 2.3  --  2.8* 2.7*  --   --   GFRNONAA  --    < > 9* 11* 12* 13*  ANIONGAP  --    < > 10 8 10 9    < > = values in this interval not displayed.    Recent Labs  Lab 11/29/22 0153 11/30/22 0320 12/02/22 0449 12/03/22 1014 12/04/22 0802 12/05/22 0500  PROT 6.4* 6.3* 6.0*  --   --   --   ALBUMIN 2.7* 2.7* 2.4* 2.4* 2.5* 2.2*  AST 64* 67* 37  --   --   --   ALT 70* 73* 61*  --   --   --   ALKPHOS 136* 134* 113  --   --   --   BILITOT 0.5 0.6 0.5  --   --   --    Lipids No results for input(s): "CHOL", "TRIG", "HDL", "LABVLDL", "LDLCALC", "CHOLHDL" in Jillian last 168 hours.  Hematology Recent Labs  Lab 12/01/22 0801 12/01/22 1015 12/02/22 0449 12/03/22 1014  WBC 7.7  --  8.2 8.8  RBC 3.40*  --  3.30* 3.43*  HGB 9.3* 9.9* 9.0* 9.4*  HCT 30.9* 29.0* 29.1* 29.8*  MCV 90.9  --  88.2 86.9  MCH 27.4  --  27.3 27.4  MCHC 30.1  --  30.9 31.5  RDW 16.0*  --  15.9* 15.7*  PLT 144*  --  140* 137*   Thyroid  Recent Labs  Lab 11/28/22 2351  TSH 1.590    BNP Recent Labs  Lab 11/28/22 1928  BNP 1,220.0*    DDimer No  results for input(s): "DDIMER" in Jillian last 168 hours.   Radiology/Studies:  DG CHEST PORT 1 VIEW  Result Date: 12/05/2022 CLINICAL DATA:  Shortness of breath EXAM: PORTABLE CHEST 1 VIEW COMPARISON:  Chest radiograph 12/02/2022 FINDINGS: Monitoring leads overlie Jillian [name]. Stable cardiomegaly. Tortuosity of Jillian thoracic aorta. Probable small bilateral layering pleural effusions. Interval increase in airspace opacities throughout Jillian right lung. Similar appearance of Jillian left lung. No pneumothorax. IMPRESSION: 1. Interval increase in airspace opacities throughout Jillian right lung which may represent edema or infection. 2. Small bilateral pleural effusions. Electronically Signed   By: Lovey Newcomer M.D.   On: 12/05/2022 11:31   DG Chest Port 1 View  Result Date: 12/02/2022 CLINICAL DATA:  Acute respiratory failure EXAM: PORTABLE CHEST 1 VIEW COMPARISON:  Chest x-ray dated November 30, 2022 FINDINGS: Unchanged cardiomegaly. Mild diffuse interstitial opacities, increased when compared with Jillian prior exam. Small bilateral pleural effusions, similar to prior. No evidence of pneumothorax. IMPRESSION: Mild diffuse interstitial opacities, increased when compared with Jillian prior exam, likely due to worsened pulmonary edema. Electronically Signed   By: Yetta Glassman M.D.   On: 12/02/2022 08:15     Assessment and Plan:  Karelly Dewalt is a 87 y.o. female with a hx of CKD stage IV, chronic diastolic heart failure, stroke, hypertension, paroxysmal A-fib on Eliquis who is being seen 12/05/2022 for Jillian evaluation of afib with RVR at Jillian request of Dr. Sloan Leiter.  Afib with RVR (recurrent) CHA2DS2-VASc Score = 5   Cardiology consulted for assistance in managing afib. Jillian [name] to be in afib with RVR in Jillian ED on 1/14 and was cardioverted, maintaining NSR through this morning. A review of telemetry shows that Jillian [name] had recurrent afib with RVR beginning around 8:15am. Jillian [name] has received 150mg  amiodarone bolus followed  by IV infusion at a rate of 60mg /hour but continues with significant RVR. Fortunately, Jillian [name] is without chest pain or palpitations. Afib likely 2/2 acute respiratory failure with influenza. Would expect improvement as respiratory status improves.  HR continues to be rapid with rates up to 160 bpm. Will re-bolus amiodarone 150mg  and advise continuing 60mg /hr until adequate rate control achieved. Continue renally adjusted Eliquis 2.5mg  BID Would use beta-blockers cautiously given bradycardia following cardioversion earlier this admission. Will discontinue Coreg and hydralazine to give room for rate control. Plan to start Metoprolol IV scheduled.  Acute on Chronic diastolic CHF  Jillian [name] initially hypotensive this admission following cardioversion but following fluid resuscitation, appeared to become hypervolemic with pulmonary edema. Now appears generally euvolemic on exam following diuresis. TTE this admission with LVEF 55%, no regional wall motion abnormalities. Diastolic parameters indeterminate.  Agree with plans to transition to oral lasix tomorrow. Use caution with further IV fluids.  Per primary team:  Acute hypoxemic and hypercapnic respiratory failure Essential hypertension AKI on CKD  Risk Assessment/Risk Scores:      CHA2DS2-VASc Score = 7   This indicates a 11.2% annual risk of stroke. Jillian [name]'s score is based upon: CHF History: 1 HTN History: 1 Diabetes History: 0 Stroke History: 2 Vascular Disease History: 0 Age Score: 2 Gender Score: 1     For questions or updates, please contact [place] Please consult [email or web address] for contact info under    Signed, Lily Kocher, PA-C  12/05/2022 1:45 PM   Jillian [name] seen and examined   I agree with Jillian findings [name] by E Williams above  Jillian [name] is an 87 yo with hx of CKD, Chronic diastolic CHF, HTN, Hx CVA and PAF on Eliquis  Jillian [name] admitted on 11/28/22 due to respiratory distress, volume overload.  In ER was in AFib  with RVR   Underwent DCCV.   Initially required pressors  and IV fluids    Echo 11/29/22 LVEF 55%  RVEF normal  Moderate MR   TOday at about 8:15 AM Jillian [name] redeveloped afib with RVR  Currently appears tired, uncomfortable in bed   Denies CP  O2 sat 99%   BP 124/114   Neck  No JVD Lungs    Rhonchi, wheezes Cardiac IRreg irreg   No significant murmurs  Abd  RUQ tenderness Ext with no edema   2+ pulses  Would begin IV amiodaone (bolus then keep IV rate at 60 mg per min) Would also adjust antihypertensives.   I would use metoprolol instead  of carvedilol;  IT will give more room for HR control    I would cut back on hydralazine.  Again, gives more room fro HR control. Watch I/O   May need some diuresis given rapid rates / MR  WIll continue to follow   Dorris Carnes MD

## 2022-12-06 DIAGNOSIS — J9602 Acute respiratory failure with hypercapnia: Secondary | ICD-10-CM | POA: Diagnosis not present

## 2022-12-06 DIAGNOSIS — I4819 Other persistent atrial fibrillation: Secondary | ICD-10-CM

## 2022-12-06 LAB — CBC
HCT: 32.6 % — ABNORMAL LOW (ref 36.0–46.0)
Hemoglobin: 10.5 g/dL — ABNORMAL LOW (ref 12.0–15.0)
MCH: 27.5 pg (ref 26.0–34.0)
MCHC: 32.2 g/dL (ref 30.0–36.0)
MCV: 85.3 fL (ref 80.0–100.0)
Platelets: 178 10*3/uL (ref 150–400)
RBC: 3.82 MIL/uL — ABNORMAL LOW (ref 3.87–5.11)
RDW: 15.6 % — ABNORMAL HIGH (ref 11.5–15.5)
WBC: 9.4 10*3/uL (ref 4.0–10.5)
nRBC: 0.2 % (ref 0.0–0.2)

## 2022-12-06 LAB — GLUCOSE, CAPILLARY
Glucose-Capillary: 120 mg/dL — ABNORMAL HIGH (ref 70–99)
Glucose-Capillary: 95 mg/dL (ref 70–99)
Glucose-Capillary: 171 mg/dL — ABNORMAL HIGH (ref 70–99)
Glucose-Capillary: 180 mg/dL — ABNORMAL HIGH (ref 70–99)
Glucose-Capillary: 170 mg/dL — ABNORMAL HIGH (ref 70–99)

## 2022-12-06 LAB — RENAL FUNCTION PANEL
Albumin: 2.2 g/dL — ABNORMAL LOW (ref 3.5–5.0)
Anion gap: 9 (ref 5–15)
BUN: 79 mg/dL — ABNORMAL HIGH (ref 8–23)
CO2: 33 mmol/L — ABNORMAL HIGH (ref 22–32)
Calcium: 8.6 mg/dL — ABNORMAL LOW (ref 8.9–10.3)
Chloride: 102 mmol/L (ref 98–111)
Creatinine, Ser: 3.37 mg/dL — ABNORMAL HIGH (ref 0.44–1.00)
GFR, Estimated: 13 mL/min — ABNORMAL LOW (ref >60)
Glucose, Bld: 109 mg/dL — ABNORMAL HIGH (ref 70–99)
Phosphorus: 3.9 mg/dL (ref 2.5–4.6)
Potassium: 3.6 mmol/L (ref 3.5–5.1)
Sodium: 144 mmol/L (ref 135–145)

## 2022-12-06 MED ORDER — METOPROLOL TARTRATE 25 MG PO TABS
25.0000 mg | ORAL_TABLET | Freq: Two times a day (BID) | ORAL | Status: DC
  Administered 2022-12-06 (×2): 25 mg via ORAL
  Filled 2022-12-06 (×2): qty 1

## 2022-12-06 MED ORDER — METOPROLOL TARTRATE 5 MG/5ML IV SOLN
5.0000 mg | Freq: Four times a day (QID) | INTRAVENOUS | Status: DC | PRN
  Administered 2022-12-06: 5 mg via INTRAVENOUS
  Filled 2022-12-06: qty 5

## 2022-12-06 MED ORDER — LORAZEPAM 0.5 MG PO TABS
0.5000 mg | ORAL_TABLET | Freq: Four times a day (QID) | ORAL | Status: DC | PRN
  Administered 2022-12-06: 0.5 mg via ORAL
  Filled 2022-12-06: qty 1

## 2022-12-06 MED ORDER — AMIODARONE HCL 200 MG PO TABS: 200.0000 mg | ORAL_TABLET | Freq: Every day | ORAL | Status: DC

## 2022-12-06 MED ORDER — AMIODARONE HCL 200 MG PO TABS
200.0000 mg | ORAL_TABLET | Freq: Two times a day (BID) | ORAL | Status: DC
  Administered 2022-12-06 – 2022-12-08 (×5): 200 mg via ORAL
  Filled 2022-12-06 (×5): qty 1

## 2022-12-06 NOTE — Progress Notes (Signed)
Heart Failure Navigator Progress Note  Assessed for Heart & Vascular TOC clinic readiness.  Patient does not meet criteria due to will be discharged home with Hospice.  Navigator will sign off at this time.    Earnestine Leys, BSN, Clinical cytogeneticist Only

## 2022-12-06 NOTE — Progress Notes (Signed)
1/22 IMM Letter given to patient by nurse assigned to patient.

## 2022-12-06 NOTE — Progress Notes (Signed)
Physical Therapy Treatment Patient Details Name: Jillian [name] MRN: [id number] DOB: May 12, 1936 Today's Date: 12/06/2022   History of Present Illness 87 yo female admitted 1/14 with respiratory failure, Afib with RVR and flu (+). Cardioversion 1/14. PMhx; CKD, CHF, CVA, HTN, Afib    PT Comments    Pt is limited by fatigue, declining ambulation at this time. Pt becomes tachy into 130s with limited mobility, but recovers well when seated and resting. Pt does not require physical assistance for limited mobility during session, however due to poor endurance she would likely require assistance if ambulating for household distances. PT continues to recommend HHPT if in alignment with goals of care.   Recommendations for follow up therapy are one component of a multi-disciplinary discharge planning process, led by the attending physician.  Recommendations may be updated based on patient status, additional functional criteria and insurance authorization.  Follow Up Recommendations  Home health PT (if in alignment with goals of care)     Assistance Recommended at Discharge Frequent or constant Supervision/Assistance  Patient can return home with the following A little help with walking and/or transfers;A lot of help with bathing/dressing/bathroom;Assistance with cooking/housework;Assist for transportation;Help with stairs or ramp for entrance   Equipment Recommendations  None recommended by PT    Recommendations for Other Services       Precautions / Restrictions Precautions Precautions: Fall Precaution Comments: watch sats Restrictions Weight Bearing Restrictions: No     Mobility  Bed Mobility Overal bed mobility: Needs Assistance Bed Mobility: Supine to Sit     Supine to sit: Supervision     General bed mobility comments: increased time    Transfers Overall transfer level: Needs assistance Equipment used: Rolling walker (2 wheels) Transfers: Sit to/from Stand, Bed to  chair/wheelchair/BSC Sit to Stand: Min assist   Step pivot transfers: Min guard            Ambulation/Gait Ambulation/Gait assistance:  (pt declines ambulation due to fatigue)                 Stairs             Wheelchair Mobility    Modified Rankin (Stroke Patients Only)       Balance Overall balance assessment: Needs assistance Sitting-balance support: No upper extremity supported, Feet supported Sitting balance-Leahy Scale: Good     Standing balance support: Bilateral upper extremity supported, Reliant on assistive device for balance Standing balance-Leahy Scale: Poor                              Cognition Arousal/Alertness: Awake/alert Behavior During Therapy: Flat affect Overall Cognitive Status: No family/caregiver present to determine baseline cognitive functioning                                 General Comments: slowed processing, limited verbal responses to short phrases at a time        Exercises      General Comments General comments (skin integrity, edema, etc.): pt on 1L [place], desats to high 80s quickly when weaned to room air but recovers well. Tachy into 130s with mobility, recovers to low 100s when resting      Pertinent Vitals/Pain Pain Assessment Pain Assessment: No/denies pain    Home Living  Prior Function            PT Goals (current goals can now be found in the care plan section) Acute Rehab PT Goals Patient Stated Goal: return home Progress towards PT goals: Progressing toward goals    Frequency    Min 3X/week      PT Plan Current plan remains appropriate    Co-evaluation              AM-PAC PT "6 Clicks" Mobility   Outcome Measure  Help needed turning from your back to your side while in a flat bed without using bedrails?: A Little Help needed moving from lying on your back to sitting on the side of a flat bed without using bedrails?:  A Little Help needed moving to and from a bed to a chair (including a wheelchair)?: A Little Help needed standing up from a chair using your arms (e.g., wheelchair or bedside chair)?: A Little Help needed to walk in hospital room?: A Lot Help needed climbing 3-5 steps with a railing? : Total 6 Click Score: 15    End of Session Equipment Utilized During Treatment: Oxygen Activity Tolerance: Patient limited by fatigue Patient left: in chair;with call bell/phone within reach;with chair alarm set Nurse Communication: Mobility status PT Visit Diagnosis: Other abnormalities of gait and mobility (R26.89);Muscle weakness (generalized) (M62.81)     Time: 1035-1050 PT Time Calculation (min) (ACUTE ONLY): 15 min  Charges:  $Therapeutic Activity: 8-22 mins                     Zenaida Niece, PT, DPT Acute Rehabilitation Office [facility] Gladys Gutman 12/06/2022, 1:12 PM

## 2022-12-06 NOTE — Progress Notes (Signed)
[facility]) Hospital Liaison Note   Received request from [facility], Jillian [name] , for hospice services at home after discharge.    Spoke with patient's daughter, Jillian [name] to initiate education related to hospice philosophy, services, and team approach to care. Jillian [name] verbalized understanding of information given. Per discussion, the plan is for patient to discharge home via EMS once cleared to DC.    DME needs discussed. Patient has the following equipment in the home (Purchased privately): None Patient requests the following equipment for delivery: Oxygen, Wheelchair, and hospital bed   Address verified and is correct in the chart. Jillian [name] is the family member to contact to arrange time of equipment delivery.    Please send signed and completed DNR home with patient/family. Please provide prescriptions at discharge as needed to ensure ongoing symptom management.    [facility] information and contact numbers given to family & above information shared with TOC.   Please call with any questions/concerns.    Thank you for the opportunity to participate in this patient's care.   Zigmund Gottron  [facility] Liaison  [phone]

## 2022-12-06 NOTE — IPAL (Signed)
  Interdisciplinary Goals of Care Family Meeting   Date carried out: 12/06/2022  Location of the meeting: Phone conference  Member's involved: Physician, Bedside Registered Nurse, Family Member or next of kin, and Other: daughter at the bedside   [place] or acting medical decision maker: [name] , daughter    Patient's daughter Sharyn Lull at the bedside.  Decision maker [name] on the FaceTime.  Discussion: We discussed goals of care for [facility] .  We had multiple goals of care discussion for this patient since her admission.  Today patient remains on amiodarone infusion, very labile breathing status.  Kidney function abnormality and fluid overload.  Patient desires to go home.  With her severe frailty and debility, patient is not a dialysis candidate. We discussed about going home with home hospice as patient wishes to go home and all family members agree with that. Patient is currently on amiodarone infusion, will change to oral amiodarone when she is going home. Will continue all supportive measures including diuretics, rate control medications and anticoagulation. Discussed with [facility] hospice and they will communicate with family to establish DME and admission at home with hospice care.  Patient will be discharged when all DME's are available at home.  Code status:   Code Status: DNR   Disposition: Home with Hospice  Time spent for the meeting: 25  minutes    Barb Merino, MD  12/06/2022, 10:26 AM

## 2022-12-06 NOTE — Progress Notes (Signed)
Rounding Note    Patient Name: Makyiah Lie Date of Encounter: 12/06/2022  [place] Cardiologist: Dorris Carnes, MD   Subjective   No CP or dyspnea; general malaise  Inpatient Medications    Scheduled Meds:  amLODipine  5 mg Oral Daily   apixaban  2.5 mg Oral BID   arformoterol  15 mcg Nebulization BID   budesonide (PULMICORT) nebulizer solution  0.5 mg Nebulization BID   Chlorhexidine Gluconate Cloth  6 each Topical Q0600   furosemide  40 mg Oral Daily   hydrALAZINE  50 mg Oral Q8H   insulin aspart  0-15 Units Subcutaneous Q4H   methylPREDNISolone (SOLU-MEDROL) injection  40 mg Intravenous Daily   metoprolol tartrate  5 mg Intravenous Q4H   mouth rinse  15 mL Mouth Rinse 4 times per day   polyethylene glycol  17 g Oral Daily   revefenacin  175 mcg Nebulization Daily   sodium chloride flush  3 mL Intravenous Q12H   Continuous Infusions:  sodium chloride Stopped (12/01/22 1343)   amiodarone 30 mg/hr (12/05/22 1700)   PRN Meds: levalbuterol, lip balm, mouth rinse   Vital Signs    Vitals:   12/05/22 2306 12/06/22 0021 12/06/22 0307 12/06/22 0500  BP: (!) 149/71 108/61 (!) 164/82   Pulse: 92     Resp: 15     Temp:   98.4 F (36.9 C)   TempSrc:   Axillary   SpO2: 97%     Weight:    87.8 kg  Height:        Intake/Output Summary (Last 24 hours) at 12/06/2022 0817 Last data filed at 12/06/2022 0400 Gross per 24 hour  Intake 522.56 ml  Output 1150 ml  Net -627.44 ml      12/06/2022    5:00 AM 12/03/2022    5:00 AM 12/02/2022    3:44 AM  Last 3 Weights  Weight (lbs) 193 lb 9 oz 193 lb 5.5 oz 195 lb 15.8 oz  Weight (kg) 87.8 kg 87.7 kg 88.9 kg      Telemetry    Atrial fibrillation rate controlled - Personally Reviewed   Physical Exam   GEN: No acute distress.   Neck: No JVD Cardiac: irregular Respiratory: Mild rhonchi GI: Soft, nontender, non-distended  MS: No edema Neuro:  Nonfocal  Psych: Normal affect   Labs    High Sensitivity  Troponin:   Recent Labs  Lab 11/28/22 1928 11/28/22 2351  TROPONINIHS 101* 94*     Chemistry Recent Labs  Lab 11/30/22 0320 11/30/22 1018 12/02/22 0449 12/03/22 1014 12/04/22 0802 12/05/22 0500 12/06/22 0423  NA 138   < > 137 138 141 143 144  K 4.7   < > 4.7 4.5 4.3 3.9 3.6  CL 102   < > 102 102 102 102 102  CO2 25   < > 25 28 29  32 33*  GLUCOSE 113*   < > 116* 105* 131* 85 109*  BUN 69*   < > 81* 85* 83* 79* 79*  CREATININE 4.46*   < > 4.42* 3.96* 3.65* 3.37* 3.37*  CALCIUM 7.9*   < > 8.2* 8.3* 8.6* 8.6* 8.6*  MG  --   --  2.8* 2.7*  --   --   --   PROT 6.3*  --  6.0*  --   --   --   --   ALBUMIN 2.7*  --  2.4* 2.4* 2.5* 2.2* 2.2*  AST 67*  --  37  --   --   --   --   ALT 73*  --  61*  --   --   --   --   ALKPHOS 134*  --  113  --   --   --   --   BILITOT 0.6  --  0.5  --   --   --   --   GFRNONAA 9*   < > 9* 11* 12* 13* 13*  ANIONGAP 11   < > 10 8 10 9 9    < > = values in this interval not displayed.     Hematology Recent Labs  Lab 12/02/22 0449 12/03/22 1014 12/06/22 0423  WBC 8.2 8.8 9.4  RBC 3.30* 3.43* 3.82*  HGB 9.0* 9.4* 10.5*  HCT 29.1* 29.8* 32.6*  MCV 88.2 86.9 85.3  MCH 27.3 27.4 27.5  MCHC 30.9 31.5 32.2  RDW 15.9* 15.7* 15.6*  PLT 140* 137* 178    Radiology    DG CHEST PORT 1 VIEW  Result Date: 12/05/2022 CLINICAL DATA:  Shortness of breath EXAM: PORTABLE CHEST 1 VIEW COMPARISON:  Chest radiograph 12/02/2022 FINDINGS: Monitoring leads overlie the patient. Stable cardiomegaly. Tortuosity of the thoracic aorta. Probable small bilateral layering pleural effusions. Interval increase in airspace opacities throughout the right lung. Similar appearance of the left lung. No pneumothorax. IMPRESSION: 1. Interval increase in airspace opacities throughout the right lung which may represent edema or infection. 2. Small bilateral pleural effusions. Electronically Signed   By: Lovey Newcomer M.D.   On: 12/05/2022 11:31     Patient Profile     87 y.o. female  with past medical history of chronic stage IV kidney disease, chronic diastolic congestive heart failure, paroxysmal atrial fibrillation, hypertension, prior CVA admitted with CHF and found to be in atrial fibrillation with rapid ventricular response.  Cardioverted in the emergency room but developed recurrent atrial fibrillation and placed on amiodarone.  Echocardiogram shows ejection fraction 55%, mild left atrial enlargement, moderate mitral regurgitation.  Assessment & Plan    1 persistent atrial fibrillation-patient remains in atrial fibrillation this morning but heart rate has improved.  Continue IV amiodarone and will transition to oral tomorrow.  Will change IV metoprolol to metoprolol 25 mg twice daily.  Follow heart rate and advance as needed.  Continue apixaban.  Will consider cardioversion after she recovers from influenza.  2 chronic diastolic congestive heart failure-she appears to be euvolemic this morning.  Will continue Lasix at present dose.  3 acute on chronic stage IV kidney disease-nephrology is following.  4 influenza A-Per primary service.  5 hypertension-continue present blood pressure medications and follow-up.  For questions or updates, please contact [place] Please consult [email or web address] for contact info under        Signed, Kirk Ruths, MD  12/06/2022, 8:17 AM

## 2022-12-06 NOTE — Progress Notes (Signed)
PROGRESS NOTE    Jillian [name]  ZWC:[id number] DOB: 07/29/1936 DOA: 11/28/2022 PCP: [name], MD    Brief Narrative:  87 year old with history of CKD stage IV, chronic diastolic heart failure, stroke, hypertension, paroxysmal A-fib on Eliquis presented to the emergency room from home on 1/14 with respiratory distress.  In the emergency room she was tachypneic but afebrile.  Initially placed on BiPAP and treated with bronchodilators and steroids.  Chest x-ray consistent with fluid overload.  She was also found with rapid A-fib, she was cardioverted in the emergency room and rate was controlled.  Subsequently after cardioversion she went into hypotensive episode and was started with peripheral Levophed and transferred to ICU on BiPAP and peripheral Levophed.  1/14 admitted to [facility] resp distress on bipap; afib rvr cardioverted then hypotensive and bradycardic placed on levo 1/15 Started Tamiflu for positive flu PCR.  Did not respond to IV Lasix 1/16 started IV fluids for AKI 1/18, intermittently short of breath with pulmonary edema on chest x-ray.  Using BiPAP as needed.  Nephrology consulted. 1/20, remains debilitated very frail.  Renal function somewhat improving.  Persistently hypertensive. 1/21, developed rapid A-fib.  Tachypnea.  Started on amiodarone. 1/22 Remains frail , wants to go home. See goal of care discussion below. Overnight transiently on BIPAP.   Assessment & Plan:   Acute hypoxemic and hypercapnic respiratory failure: In the setting of influenza A infection and fluid overload.  Remains in very frail and debilitated condition.  Intermittently on BiPAP.  Currently on room air. Will continue with steroid taper.  Continue inhalational steroids and nebulizer therapy.  Completed Tamiflu. Still with significant shortness of breath.  See goal of care discussion below.  Acute kidney injury on chronic kidney disease stage IV: Known baseline creatinine of 2.2.  Urine output 1150  mL last 24 hours.  Changed to oral Lasix starting today. Urine output is adequate and responding to diuresis however, clinical status remains very poor. Followed by nephrology.  Not a dialysis candidate.  Essential hypertension:  On amlodipine and hydralazine.  Will continue current doses of amlodipine, hydralazine and metoprolol.  Recurrent A-fib with RVR: Cardioversion in the emergency room followed by bradycardia and hypotension.  1/21, after maintaining sinus rhythm patient developed rapid A-fib . Started on metoprolol 25 twice daily, currently on amiodarone infusion.  Heart rate control is suboptimal. Since planning to send home with hospice, will change to oral amiodarone today. This was discussed with cardiology.   Acute metabolic encephalopathy due to hypercapnia: Mental status gradually normalizing.  More awake today but remains very debilitated and lethargic.  Chronic anemia and thrombocytopenia: Stable.  Goal of care discussion: See detailed goal of care discussion today. Will try to optimize symptoms and transition home with home hospice.   DVT prophylaxis: apixaban (ELIQUIS) tablet 2.5 mg Start: 12/01/22 1015 apixaban (ELIQUIS) tablet 2.5 mg   Code Status: Full code Family Communication: Grand Daughter [name] on the [facility].  Daughter Sharyn Lull at the bedside. Disposition Plan: Status is: Inpatient Remains inpatient appropriate because: Respiratory distress, renal functions abnormality.  Rapid A-fib.     Consultants:  Critical care Nephrology Palliative care Cardiology.  Procedures:  Cardioversion in the emergency room.  Antimicrobials:  Tamiflu, completed   Subjective: Patient seen and examined.  Overnight remains on A-fib.  Patient tells me "I am fine I want to go home".  Remains very frail and debilitated. Flat affect, anxious . Does not feel well.   Objective: Vitals:   12/06/22 0021 12/06/22 2353  12/06/22 0500 12/06/22 0920  BP: 108/61 (!) 164/82     Pulse:      Resp:      Temp:  98.4 F (36.9 C)    TempSrc:  Axillary    SpO2:    97%  Weight:   87.8 kg   Height:        Intake/Output Summary (Last 24 hours) at 12/06/2022 1143 Last data filed at 12/06/2022 0400 Gross per 24 hour  Intake 434.78 ml  Output 750 ml  Net -315.22 ml   Filed Weights   12/02/22 0344 12/03/22 0500 12/06/22 0500  Weight: 88.9 kg 87.7 kg 87.8 kg    Examination:  General: Sick looking.  Frail .  Anxious.  Not taking deep breaths in. Does not keep active conversation.  Cardiovascular: S1-S2 normal.  Irregularly irregular.  Tachycardic. Respiratory: Poor bilateral air entry. Mostly upper airway sounds. Currently on 2 L oxygen. Gastrointestinal: Soft.  Nontender.  Bowel sound present. Ext: Bilateral pedal edema left more than right. Neuro: Awake alert but lethargic.  Follows commands.  Moves all extremities equally.  Data Reviewed: I have personally reviewed following labs and imaging studies  CBC: Recent Labs  Lab 11/30/22 0320 11/30/22 1018 12/01/22 0801 12/01/22 1015 12/02/22 0449 12/03/22 1014 12/06/22 0423  WBC 7.7  --  7.7  --  8.2 8.8 9.4  NEUTROABS  --   --   --   --   --  7.1  --   HGB 9.2*   < > 9.3* 9.9* 9.0* 9.4* 10.5*  HCT 32.2*   < > 30.9* 29.0* 29.1* 29.8* 32.6*  MCV 93.3  --  90.9  --  88.2 86.9 85.3  PLT 142*  --  144*  --  140* 137* 178   < > = values in this interval not displayed.   Basic Metabolic Panel: Recent Labs  Lab 12/02/22 0449 12/03/22 1014 12/04/22 0802 12/05/22 0500 12/06/22 0423  NA 137 138 141 143 144  K 4.7 4.5 4.3 3.9 3.6  CL 102 102 102 102 102  CO2 25 28 29  32 33*  GLUCOSE 116* 105* 131* 85 109*  BUN 81* 85* 83* 79* 79*  CREATININE 4.42* 3.96* 3.65* 3.37* 3.37*  CALCIUM 8.2* 8.3* 8.6* 8.6* 8.6*  MG 2.8* 2.7*  --   --   --   PHOS  --  4.2 4.3 3.6 3.9   GFR: Estimated Creatinine Clearance: 12.5 mL/min (A) (by C-G formula based on SCr of 3.37 mg/dL (H)). Liver Function Tests: Recent Labs   Lab 11/30/22 0320 12/02/22 0449 12/03/22 1014 12/04/22 0802 12/05/22 0500 12/06/22 0423  AST 67* 37  --   --   --   --   ALT 73* 61*  --   --   --   --   ALKPHOS 134* 113  --   --   --   --   BILITOT 0.6 0.5  --   --   --   --   PROT 6.3* 6.0*  --   --   --   --   ALBUMIN 2.7* 2.4* 2.4* 2.5* 2.2* 2.2*   No results for input(s): "LIPASE", "AMYLASE" in the last 168 hours. No results for input(s): "AMMONIA" in the last 168 hours.  Coagulation Profile: No results for input(s): "INR", "PROTIME" in the last 168 hours.  Cardiac Enzymes: No results for input(s): "CKTOTAL", "CKMB", "CKMBINDEX", "TROPONINI" in the last 168 hours. BNP (last 3 results) No results for input(s): "PROBNP"  in the last 8760 hours. HbA1C: No results for input(s): "HGBA1C" in the last 72 hours.  CBG: Recent Labs  Lab 12/05/22 1612 12/05/22 1911 12/05/22 2305 12/06/22 0258 12/06/22 0848  GLUCAP 159* 201* 159* 120* 95   Lipid Profile: No results for input(s): "CHOL", "HDL", "LDLCALC", "TRIG", "CHOLHDL", "LDLDIRECT" in the last 72 hours. Thyroid Function Tests: No results for input(s): "TSH", "T4TOTAL", "FREET4", "T3FREE", "THYROIDAB" in the last 72 hours. Anemia Panel: No results for input(s): "VITAMINB12", "FOLATE", "FERRITIN", "TIBC", "IRON", "RETICCTPCT" in the last 72 hours. Sepsis Labs: No results for input(s): "PROCALCITON", "LATICACIDVEN" in the last 168 hours.  Recent Results (from the past 240 hour(s))  MRSA Next Gen by PCR, Nasal     Status: None   Collection Time: 11/29/22 12:22 AM   Specimen: Nasal Mucosa; Nasal Swab  Result Value Ref Range Status   MRSA by PCR Next Gen NOT DETECTED NOT DETECTED Final    Comment: (NOTE) The GeneXpert MRSA Assay (FDA approved for NASAL specimens only), is one component of a comprehensive MRSA colonization surveillance program. It is not intended to diagnose MRSA infection nor to guide or monitor treatment for MRSA infections. Test performance is not  FDA approved in patients less than 31 years old. Performed at [facility], [place] [address], [place], [place] [address]   Resp panel by RT-PCR (RSV, Flu A&B, Covid) Anterior Nasal Swab     Status: Abnormal   Collection Time: 11/29/22 12:02 PM   Specimen: Anterior Nasal Swab  Result Value Ref Range Status   SARS Coronavirus 2 by RT PCR NEGATIVE NEGATIVE Final    Comment: (NOTE) SARS-CoV-2 target nucleic acids are NOT DETECTED.  The SARS-CoV-2 RNA is generally detectable in upper respiratory specimens during the acute phase of infection. The lowest concentration of SARS-CoV-2 viral copies this assay can detect is 138 copies/mL. A negative result does not preclude SARS-Cov-2 infection and should not be used as the sole basis for treatment or other patient management decisions. A negative result may occur with  improper specimen collection/handling, submission of specimen other than nasopharyngeal swab, presence of viral mutation(s) within the areas targeted by this assay, and inadequate number of viral copies(<138 copies/mL). A negative result must be combined with clinical observations, patient history, and epidemiological information. The expected result is Negative.  Fact Sheet for Patients:  EntrepreneurPulse.com.au  Fact Sheet for Healthcare Providers:  IncredibleEmployment.be  This test is no t yet approved or cleared by the [place] FDA and  has been authorized for detection and/or diagnosis of SARS-CoV-2 by FDA under an Emergency Use Authorization (EUA). This EUA will remain  in effect (meaning this test can be used) for the duration of the COVID-19 declaration under Section 564(b)(1) of the Act, 21 U.S.C.section 360bbb-3(b)(1), unless the authorization is terminated  or revoked sooner.       Influenza A by PCR POSITIVE (A) NEGATIVE Final   Influenza B by PCR NEGATIVE NEGATIVE Final    Comment: (NOTE) The Xpert Xpress  SARS-CoV-2/FLU/RSV plus assay is intended as an aid in the diagnosis of influenza from Nasopharyngeal swab specimens and should not be used as a sole basis for treatment. Nasal washings and aspirates are unacceptable for Xpert Xpress SARS-CoV-2/FLU/RSV testing.  Fact Sheet for Patients: EntrepreneurPulse.com.au  Fact Sheet for Healthcare Providers: IncredibleEmployment.be  This test is not yet approved or cleared by the [place] FDA and has been authorized for detection and/or diagnosis of SARS-CoV-2 by FDA under an Emergency Use Authorization (EUA).  This EUA will remain in effect (meaning this test can be used) for the duration of the COVID-19 declaration under Section 564(b)(1) of the Act, 21 U.S.C. section 360bbb-3(b)(1), unless the authorization is terminated or revoked.     Resp Syncytial Virus by PCR NEGATIVE NEGATIVE Final    Comment: (NOTE) Fact Sheet for Patients: EntrepreneurPulse.com.au  Fact Sheet for Healthcare Providers: IncredibleEmployment.be  This test is not yet approved or cleared by the [place] FDA and has been authorized for detection and/or diagnosis of SARS-CoV-2 by FDA under an Emergency Use Authorization (EUA). This EUA will remain in effect (meaning this test can be used) for the duration of the COVID-19 declaration under Section 564(b)(1) of the Act, 21 U.S.C. section 360bbb-3(b)(1), unless the authorization is terminated or revoked.  Performed at [facility], [place] [address] [place], [place] [address]          Radiology Studies: DG CHEST PORT 1 VIEW  Result Date: 12/05/2022 CLINICAL DATA:  Shortness of breath EXAM: PORTABLE CHEST 1 VIEW COMPARISON:  Chest radiograph 12/02/2022 FINDINGS: Monitoring leads overlie the patient. Stable cardiomegaly. Tortuosity of the thoracic aorta. Probable small bilateral layering pleural effusions. Interval increase in  airspace opacities throughout the right lung. Similar appearance of the left lung. No pneumothorax. IMPRESSION: 1. Interval increase in airspace opacities throughout the right lung which may represent edema or infection. 2. Small bilateral pleural effusions. Electronically Signed   By: Lovey Newcomer M.D.   On: 12/05/2022 11:31        Scheduled Meds:  amiodarone  200 mg Oral BID   [START ON 12/14/2022] amiodarone  200 mg Oral Daily   amLODipine  5 mg Oral Daily   apixaban  2.5 mg Oral BID   arformoterol  15 mcg Nebulization BID   budesonide (PULMICORT) nebulizer solution  0.5 mg Nebulization BID   Chlorhexidine Gluconate Cloth  6 each Topical Q0600   furosemide  40 mg Oral Daily   hydrALAZINE  50 mg Oral Q8H   methylPREDNISolone (SOLU-MEDROL) injection  40 mg Intravenous Daily   metoprolol tartrate  25 mg Oral BID   mouth rinse  15 mL Mouth Rinse 4 times per day   polyethylene glycol  17 g Oral Daily   revefenacin  175 mcg Nebulization Daily   sodium chloride flush  3 mL Intravenous Q12H   Continuous Infusions:  sodium chloride Stopped (12/01/22 1343)     LOS: 8 days    Time spent: 35 minutes    Barb Merino, MD [facility] Pager [phone]

## 2022-12-06 NOTE — Progress Notes (Addendum)
Patient ID: Jillian [name], female   DOB: 1936-06-11, 87 y.o.   MRN: [id number] S: no acute events overnight, no complaints O:BP (!) 121/55 (BP Location: Left Arm)   Pulse 83   Temp 98.1 F (36.7 C) (Oral)   Resp 18   Ht 5\' 1"  (1.549 m)   Wt 87.8 kg   SpO2 94%   BMI 36.57 kg/m   Intake/Output Summary (Last 24 hours) at 12/06/2022 1214 Last data filed at 12/06/2022 0400 Gross per 24 hour  Intake 372.3 ml  Output 400 ml  Net -27.7 ml   Intake/Output: I/O last 3 completed shifts: In: 525.6 [I.V.:525.6] Out: 2700 [Urine:2700]  Intake/Output this shift:  No intake/output data recorded. Weight change:  Gen: chronically ill-appearing, sitting up in recliner CVS: tachy Resp: scattered rhonchi and exp wheezes, normal WOB Abd: +BS, soft, TN/ND Ext: trace edema b/l LEs  Recent Labs  Lab 11/30/22 0320 11/30/22 1018 12/01/22 0801 12/01/22 1015 12/02/22 0449 12/03/22 1014 12/04/22 0802 12/05/22 0500 12/06/22 0423  NA 138   < > 139 138 137 138 141 143 144  K 4.7   < > 4.7 4.8 4.7 4.5 4.3 3.9 3.6  CL 102  --  103  --  102 102 102 102 102  CO2 25  --  25  --  25 28 29  32 33*  GLUCOSE 113*  --  100*  --  116* 105* 131* 85 109*  BUN 69*  --  77*  --  81* 85* 83* 79* 79*  CREATININE 4.46*  --  4.44*  --  4.42* 3.96* 3.65* 3.37* 3.37*  ALBUMIN 2.7*  --   --   --  2.4* 2.4* 2.5* 2.2* 2.2*  CALCIUM 7.9*  --  8.0*  --  8.2* 8.3* 8.6* 8.6* 8.6*  PHOS  --   --   --   --   --  4.2 4.3 3.6 3.9  AST 67*  --   --   --  37  --   --   --   --   ALT 73*  --   --   --  61*  --   --   --   --    < > = values in this interval not displayed.   Liver Function Tests: Recent Labs  Lab 11/30/22 0320 12/02/22 0449 12/03/22 1014 12/04/22 0802 12/05/22 0500 12/06/22 0423  AST 67* 37  --   --   --   --   ALT 73* 61*  --   --   --   --   ALKPHOS 134* 113  --   --   --   --   BILITOT 0.6 0.5  --   --   --   --   PROT 6.3* 6.0*  --   --   --   --   ALBUMIN 2.7* 2.4*   < > 2.5* 2.2* 2.2*   < > =  values in this interval not displayed.   No results for input(s): "LIPASE", "AMYLASE" in the last 168 hours. No results for input(s): "AMMONIA" in the last 168 hours.  CBC: Recent Labs  Lab 11/30/22 0320 11/30/22 1018 12/01/22 0801 12/01/22 1015 12/02/22 0449 12/03/22 1014 12/06/22 0423  WBC 7.7  --  7.7  --  8.2 8.8 9.4  NEUTROABS  --   --   --   --   --  7.1  --   HGB 9.2*   < > 9.3*   < >  9.0* 9.4* 10.5*  HCT 32.2*   < > 30.9*   < > 29.1* 29.8* 32.6*  MCV 93.3  --  90.9  --  88.2 86.9 85.3  PLT 142*  --  144*  --  140* 137* 178   < > = values in this interval not displayed.   Cardiac Enzymes: No results for input(s): "CKTOTAL", "CKMB", "CKMBINDEX", "TROPONINI" in the last 168 hours. CBG: Recent Labs  Lab 12/05/22 1911 12/05/22 2305 12/06/22 0258 12/06/22 0848 12/06/22 1141  GLUCAP 201* 159* 120* 95 171*    Iron Studies: No results for input(s): "IRON", "TIBC", "TRANSFERRIN", "FERRITIN" in the last 72 hours. Studies/Results: DG CHEST PORT 1 VIEW  Result Date: 12/05/2022 CLINICAL DATA:  Shortness of breath EXAM: PORTABLE CHEST 1 VIEW COMPARISON:  Chest radiograph 12/02/2022 FINDINGS: Monitoring leads overlie the patient. Stable cardiomegaly. Tortuosity of the thoracic aorta. Probable small bilateral layering pleural effusions. Interval increase in airspace opacities throughout the right lung. Similar appearance of the left lung. No pneumothorax. IMPRESSION: 1. Interval increase in airspace opacities throughout the right lung which may represent edema or infection. 2. Small bilateral pleural effusions. Electronically Signed   By: Lovey Newcomer M.D.   On: 12/05/2022 11:31    amiodarone  200 mg Oral BID   [START ON 12/14/2022] amiodarone  200 mg Oral Daily   amLODipine  5 mg Oral Daily   apixaban  2.5 mg Oral BID   arformoterol  15 mcg Nebulization BID   budesonide (PULMICORT) nebulizer solution  0.5 mg Nebulization BID   Chlorhexidine Gluconate Cloth  6 each Topical Q0600    furosemide  40 mg Oral Daily   hydrALAZINE  50 mg Oral Q8H   methylPREDNISolone (SOLU-MEDROL) injection  40 mg Intravenous Daily   metoprolol tartrate  25 mg Oral BID   mouth rinse  15 mL Mouth Rinse 4 times per day   polyethylene glycol  17 g Oral Daily   revefenacin  175 mcg Nebulization Daily   sodium chloride flush  3 mL Intravenous Q12H    BMET    Component Value Date/Time   NA 144 12/06/2022 0423   NA 145 (H) 03/08/2022 1414   K 3.6 12/06/2022 0423   CL 102 12/06/2022 0423   CO2 33 (H) 12/06/2022 0423   GLUCOSE 109 (H) 12/06/2022 0423   BUN 79 (H) 12/06/2022 0423   BUN 39 (H) 03/08/2022 1414   CREATININE 3.37 (H) 12/06/2022 0423   CALCIUM 8.6 (L) 12/06/2022 0423   GFRNONAA 13 (L) 12/06/2022 0423   GFRAA 23 (L) 01/05/2021 1018   CBC    Component Value Date/Time   WBC 9.4 12/06/2022 0423   RBC 3.82 (L) 12/06/2022 0423   HGB 10.5 (L) 12/06/2022 0423   HGB 10.2 (L) 03/08/2022 1414   HCT 32.6 (L) 12/06/2022 0423   HCT 31.7 (L) 03/08/2022 1414   PLT 178 12/06/2022 0423   PLT 225 03/08/2022 1414   MCV 85.3 12/06/2022 0423   MCV 86 03/08/2022 1414   MCH 27.5 12/06/2022 0423   MCHC 32.2 12/06/2022 0423   RDW 15.6 (H) 12/06/2022 0423   RDW 13.0 03/08/2022 1414   LYMPHSABS 0.9 12/03/2022 1014   LYMPHSABS 2.3 03/08/2022 1414   MONOABS 0.6 12/03/2022 1014   EOSABS 0.0 12/03/2022 1014   EOSABS 0.1 03/08/2022 1414   BASOSABS 0.0 12/03/2022 1014   BASOSABS 0.0 03/08/2022 1414    Assessment/Plan:  AKI/CKD stage IV - likely ischemic ATN following A fib with RVR  and hypotension requiring pressors as well as acute on chronic diastolic CHF.  Pt was given IV lasix 120 mg initially but then held after she developed AKI.  No uremic symptoms.  UOP had dropped off but improved with IV lasix 60 mg.  Likely cardiorenal component as well.  She is responding to IV lasix 60 mg daily with improvement of UOP and SCr.  Will stop IV lasix 60 mg daily and transition to po lasix 40 mg daily  on 1/21.  Pt is a poor candidate for dialysis given her advanced age, multiple co-morbidities (including cognitive impairment), and poor functional and nutritional status.  This was discussed with her primary Nephrologist, Dr. Joylene Grapes who did discuss this with her daughter in the past. Cr stable today, nonoliguric.  Acute respiratory failure with hypercarbia in setting of influenza A - was on BiPAP as needed.  On solumedrol, brovana, pulmicort and Yuperli per PCCM Influenza A - tamiflu for 5 days A fib with RVR s/p cardioversion - on Eliquis and amio. Cardio following Acute on chronic diastolic CHF - likely due to A fib with RVR and AKI.   Acute encephalopathy - due to hyeprcarbia.  Improving. Elevated LFT's - likely shock liver vs congestion.  Anemia of CKD stage IV - transfuse prn. Disposition - overall prognosis is poor and not a candidate for dialysis.  Going home on hospice  Patient to be discharged home with hospice. Nothing else to offer from a nephrology perspective, will sign off. Discussed with primary service. Please call with any questions/concerns.  Gean Quint, MD [facility]

## 2022-12-06 NOTE — TOC Initial Note (Signed)
Transition of Care (TOC) - Initial/Assessment Note  [name] RN,BSN Transitions of Care Unit 4NP (Non Trauma)- RN Case Manager See Treatment Team for direct Phone #   Patient Details  Name: Jillian [name] MRN: [id number] Date of Birth: 11-Aug-1936  Transition of Care [facility]) CM/SW Contact:    Dawayne Patricia, RN Phone Number: 12/06/2022, 12:11 PM  Clinical Narrative:                 Received msg this am from MD regarding Home Hospice, Per previous CM- Ronaldo Miyamoto- referral made to [place] liaison Nicholaus Corolla- who has confirmed with daughter that plan is for home w/ hospice and choice for agency is [facility] to provide Hospice services.  [facility] will order needed DME today to try and have delivered this afternoon- oxygen, Hospital bed, and wheelchair.  DME agency will contact daughter regarding scheduling delivery.   Plan will be to transition home tomorrow w/ Hospice once DME has been delivered to  home and pt has been transitioned to oral meds.   TOC will continue to follow for transition needs.   Expected Discharge Plan: Home w Hospice Care Barriers to Discharge: Continued Medical Work up   Patient Goals and CMS Choice Patient states their goals for this hospitalization and ongoing recovery are:: return home w/ hospice          Expected Discharge Plan and Services In-house Referral: Clinical Social Work Discharge Planning Services: CM Consult Post Acute Care Choice: Hospice Living arrangements for the past 2 months: Single Family Home                 DME Arranged: Hospice Equipment Package Others DME Agency: [employer]     Representative spoke with at DME Agency: per [place]: Hospice and [place] Date [place]: 12/06/22 Time [place]: 0845 Representative spoke with at [place]: Nicholaus Corolla  Prior Living Arrangements/Services Living arrangements for the past 2 months: [place] with:: Self Patient language and need for interpreter reviewed:: Yes        Need for Family Participation in Patient Care: Yes (Comment) Care giver support system in place?: Yes (comment)   Criminal Activity/Legal Involvement Pertinent to Current Situation/Hospitalization: No - Comment as needed  Activities of Daily Living      Permission Sought/Granted                  Emotional Assessment         Alcohol / Substance Use: Not Applicable Psych Involvement: No (comment)  Admission diagnosis:  Shock ([place]) [R57.9] Atrial fibrillation with rapid ventricular response ([place]) [I48.91] Acute respiratory failure with hypercapnia ([place]) [J96.02] AKI (acute kidney injury) ([place]) [N17.9] Acute hypoxic respiratory failure ([place]) [J96.01] Patient Active Problem List   Diagnosis Date Noted   Acute respiratory failure with hypercapnia ([place]) 11/28/2022   Vitamin D deficiency 10/14/2022   Paroxysmal atrial fibrillation ([place]) 10/12/2022   Elevated brain natriuretic peptide (BNP) level 10/12/2022   Acute bronchitis 02/19/2022   Alopecia 01/05/2021   Pain in both feet 01/05/2021   Anemia due to stage 4 chronic kidney disease ([place]) 07/24/2020   Chronic diastolic heart failure ([place]) 07/24/2020   Hypertension with impaired renal function 07/24/2020   Mixed dyslipidemia 07/24/2020   Allergic rhinitis 07/24/2020   History of CVA (cerebrovascular accident) 07/24/2020   Prediabetes 07/24/2020   CKD (chronic kidney disease) stage 4, GFR 15-29 ml/min ([place]) 07/09/2020   Class 2  severe obesity due to excess calories with serious comorbidity and body mass index (BMI) of 36.0 to 36.9 in adult [facility]) 10/28/2016   Allergic conjunctivitis of both eyes 05/27/2015   Benign essential hypertension 11/15/1898   Cerebral artery occlusion 11/15/1898   PCP:  [name], MD Pharmacy:   [facility]  [place] [address] Phone: [phone] Fax: [phone]     Social Determinants of Health (SDOH) Social History: SDOH Screenings   Depression (PHQ2-9): Low Risk  (11/11/2022)  Tobacco Use: Low Risk  (10/11/2022)   SDOH Interventions:     Readmission Risk Interventions     No data to display

## 2022-12-06 NOTE — Care Management Important Message (Signed)
Important Message  Patient Details  Name: Jillian [name] MRN: [id number] Date of Birth: May 07, 1936   Medicare Important Message Given:  Yes     Hannah Beat 12/06/2022, 3:17 PM

## 2022-12-07 DIAGNOSIS — I4891 Unspecified atrial fibrillation: Secondary | ICD-10-CM

## 2022-12-07 DIAGNOSIS — J9602 Acute respiratory failure with hypercapnia: Secondary | ICD-10-CM | POA: Diagnosis not present

## 2022-12-07 LAB — GLUCOSE, CAPILLARY
Glucose-Capillary: 204 mg/dL — ABNORMAL HIGH (ref 70–99)
Glucose-Capillary: 136 mg/dL — ABNORMAL HIGH (ref 70–99)
Glucose-Capillary: 113 mg/dL — ABNORMAL HIGH (ref 70–99)
Glucose-Capillary: 198 mg/dL — ABNORMAL HIGH (ref 70–99)

## 2022-12-07 LAB — RENAL FUNCTION PANEL
Albumin: 2.1 g/dL — ABNORMAL LOW (ref 3.5–5.0)
Anion gap: 9 (ref 5–15)
BUN: 77 mg/dL — ABNORMAL HIGH (ref 8–23)
CO2: 33 mmol/L — ABNORMAL HIGH (ref 22–32)
Calcium: 8.4 mg/dL — ABNORMAL LOW (ref 8.9–10.3)
Chloride: 103 mmol/L (ref 98–111)
Creatinine, Ser: 3.31 mg/dL — ABNORMAL HIGH (ref 0.44–1.00)
GFR, Estimated: 13 mL/min — ABNORMAL LOW (ref >60)
Glucose, Bld: 144 mg/dL — ABNORMAL HIGH (ref 70–99)
Phosphorus: 3.8 mg/dL (ref 2.5–4.6)
Potassium: 3.7 mmol/L (ref 3.5–5.1)
Sodium: 145 mmol/L (ref 135–145)

## 2022-12-07 MED ORDER — AMLODIPINE BESYLATE 5 MG PO TABS: 5.0000 mg | ORAL_TABLET | Freq: Every day | ORAL | 0 refills | Status: DC

## 2022-12-07 MED ORDER — LORAZEPAM 0.5 MG PO TABS: 0.5000 mg | ORAL_TABLET | Freq: Four times a day (QID) | ORAL | 0 refills | Status: AC | PRN

## 2022-12-07 MED ORDER — METOPROLOL TARTRATE 50 MG PO TABS: 50.0000 mg | ORAL_TABLET | Freq: Two times a day (BID) | ORAL | 0 refills | Status: DC

## 2022-12-07 MED ORDER — AMIODARONE HCL 200 MG PO TABS: 200.0000 mg | ORAL_TABLET | Freq: Every day | ORAL | 0 refills | Status: DC

## 2022-12-07 MED ORDER — OXYCODONE HCL 5 MG PO TABS: 5.0000 mg | ORAL_TABLET | ORAL | 0 refills | Status: AC | PRN

## 2022-12-07 MED ORDER — METOPROLOL TARTRATE 25 MG PO TABS: 25.0000 mg | ORAL_TABLET | Freq: Two times a day (BID) | ORAL | 0 refills | Status: DC

## 2022-12-07 MED ORDER — AMIODARONE HCL 200 MG PO TABS: 200.0000 mg | ORAL_TABLET | Freq: Two times a day (BID) | ORAL | 0 refills | Status: DC

## 2022-12-07 MED ORDER — METOPROLOL TARTRATE 50 MG PO TABS
50.0000 mg | ORAL_TABLET | Freq: Two times a day (BID) | ORAL | Status: DC
  Administered 2022-12-07 – 2022-12-08 (×3): 50 mg via ORAL
  Filled 2022-12-07 (×3): qty 1

## 2022-12-07 MED ORDER — POLYETHYLENE GLYCOL 3350 17 G PO PACK: 17.0000 g | PACK | Freq: Every day | ORAL | 0 refills | Status: DC

## 2022-12-07 MED ORDER — HYDRALAZINE HCL 50 MG PO TABS: 50.0000 mg | ORAL_TABLET | Freq: Three times a day (TID) | ORAL | 0 refills | Status: DC

## 2022-12-07 MED ORDER — LEVALBUTEROL HCL 1.25 MG/0.5ML IN NEBU: 1.2500 mg | INHALATION_SOLUTION | Freq: Four times a day (QID) | RESPIRATORY_TRACT | 0 refills | Status: AC | PRN

## 2022-12-07 MED ORDER — FUROSEMIDE 40 MG PO TABS: 40.0000 mg | ORAL_TABLET | Freq: Every day | ORAL | 0 refills | Status: AC

## 2022-12-07 NOTE — Care Plan (Signed)
Preparing to discharge patient with hospice care at home.  DME and materials have been supplied and delivered to home.  Patient's daughter trying to arrange more family members to help her at home and wanting to have hospice admit her at the same day.  Per patient's safe discharge and to arrange adequate family support system at home, will plan discharge tomorrow morning.

## 2022-12-07 NOTE — Progress Notes (Signed)
Rounding Note    Patient Name: Jillian [name] Date of Encounter: 12/07/2022  [place] Cardiologist: Dorris Carnes, MD   Subjective   Denies CP or dyspnea; improved  Inpatient Medications    Scheduled Meds:  amiodarone  200 mg Oral BID   [START ON 12/14/2022] amiodarone  200 mg Oral Daily   amLODipine  5 mg Oral Daily   apixaban  2.5 mg Oral BID   arformoterol  15 mcg Nebulization BID   budesonide (PULMICORT) nebulizer solution  0.5 mg Nebulization BID   Chlorhexidine Gluconate Cloth  6 each Topical Q0600   furosemide  40 mg Oral Daily   hydrALAZINE  50 mg Oral Q8H   methylPREDNISolone (SOLU-MEDROL) injection  40 mg Intravenous Daily   metoprolol tartrate  25 mg Oral BID   mouth rinse  15 mL Mouth Rinse 4 times per day   polyethylene glycol  17 g Oral Daily   revefenacin  175 mcg Nebulization Daily   sodium chloride flush  3 mL Intravenous Q12H   Continuous Infusions:  sodium chloride Stopped (12/01/22 1343)   PRN Meds: levalbuterol, lip balm, LORazepam, metoprolol tartrate, mouth rinse   Vital Signs    Vitals:   12/07/22 0500 12/07/22 0812 12/07/22 0817 12/07/22 0826  BP: (!) 149/74   (!) 152/78  Pulse: 98     Resp: 17     Temp: 97.8 F (36.6 C)     TempSrc: Oral     SpO2: 98% 100% 100%   Weight: 81.8 kg     Height:        Intake/Output Summary (Last 24 hours) at 12/07/2022 1043 Last data filed at 12/07/2022 0500 Gross per 24 hour  Intake 133.82 ml  Output 800 ml  Net -666.18 ml       12/07/2022    5:00 AM 12/06/2022    5:00 AM 12/03/2022    5:00 AM  Last 3 Weights  Weight (lbs) 180 lb 5.4 oz 193 lb 9 oz 193 lb 5.5 oz  Weight (kg) 81.8 kg 87.8 kg 87.7 kg      Telemetry    Atrial fibrillation rate mildly elevated - Personally Reviewed   Physical Exam   GEN: NAD Neck: supple Cardiac: irregular, no rub Respiratory: Mild rhonchi; no wheeze GI: Soft, NT/ND MS: No edema Neuro:  Grossly intact Psych: Normal affect   Labs    High  Sensitivity Troponin:   Recent Labs  Lab 11/28/22 1928 11/28/22 2351  TROPONINIHS 101* 94*      Chemistry Recent Labs  Lab 12/02/22 0449 12/03/22 1014 12/04/22 0802 12/05/22 0500 12/06/22 0423 12/07/22 0555  NA 137 138   < > 143 144 145  K 4.7 4.5   < > 3.9 3.6 3.7  CL 102 102   < > 102 102 103  CO2 25 28   < > 32 33* 33*  GLUCOSE 116* 105*   < > 85 109* 144*  BUN 81* 85*   < > 79* 79* 77*  CREATININE 4.42* 3.96*   < > 3.37* 3.37* 3.31*  CALCIUM 8.2* 8.3*   < > 8.6* 8.6* 8.4*  MG 2.8* 2.7*  --   --   --   --   PROT 6.0*  --   --   --   --   --   ALBUMIN 2.4* 2.4*   < > 2.2* 2.2* 2.1*  AST 37  --   --   --   --   --  ALT 61*  --   --   --   --   --   ALKPHOS 113  --   --   --   --   --   BILITOT 0.5  --   --   --   --   --   GFRNONAA 9* 11*   < > 13* 13* 13*  ANIONGAP 10 8   < > 9 9 9    < > = values in this interval not displayed.      Hematology Recent Labs  Lab 12/02/22 0449 12/03/22 1014 12/06/22 0423  WBC 8.2 8.8 9.4  RBC 3.30* 3.43* 3.82*  HGB 9.0* 9.4* 10.5*  HCT 29.1* 29.8* 32.6*  MCV 88.2 86.9 85.3  MCH 27.3 27.4 27.5  MCHC 30.9 31.5 32.2  RDW 15.9* 15.7* 15.6*  PLT 140* 137* 178     Radiology    DG CHEST PORT 1 VIEW  Result Date: 12/05/2022 CLINICAL DATA:  Shortness of breath EXAM: PORTABLE CHEST 1 VIEW COMPARISON:  Chest radiograph 12/02/2022 FINDINGS: Monitoring leads overlie the patient. Stable cardiomegaly. Tortuosity of the thoracic aorta. Probable small bilateral layering pleural effusions. Interval increase in airspace opacities throughout the right lung. Similar appearance of the left lung. No pneumothorax. IMPRESSION: 1. Interval increase in airspace opacities throughout the right lung which may represent edema or infection. 2. Small bilateral pleural effusions. Electronically Signed   By: Lovey Newcomer M.D.   On: 12/05/2022 11:31     Patient Profile     87 y.o. female with past medical history of chronic stage IV kidney disease,  chronic diastolic congestive heart failure, paroxysmal atrial fibrillation, hypertension, prior CVA admitted with CHF and found to be in atrial fibrillation with rapid ventricular response.  Cardioverted in the emergency room but developed recurrent atrial fibrillation and placed on amiodarone.  Echocardiogram shows ejection fraction 55%, mild left atrial enlargement, moderate mitral regurgitation.  Assessment & Plan    1 persistent atrial fibrillation-patient remains in atrial fibrillation.  Heart rate is high normal to mildly elevated.  Continue amiodarone 200 mg twice daily for 1 week then 200 mg daily thereafter.  Increase metoprolol to 50 mg twice daily.  Continue apixaban.  Can consider cardioversion once she recovers completely from influenza.    2 chronic diastolic congestive heart failure-she appears to be euvolemic this morning.  Will continue Lasix at present dose.  3 acute on chronic stage IV kidney disease-nephrology is following.  4 influenza A-Per primary service.  5 hypertension-continue present blood pressure medications and follow-up.  For questions or updates, please contact [place] Please consult [email or web address] for contact info under        Signed, Kirk Ruths, MD  12/07/2022, 10:43 AM

## 2022-12-07 NOTE — Progress Notes (Signed)
Patient resting comfortably on [place] this AM without any respiratory distress noted.  Bipap orders for PRN and QHS.  Currently not indicated at this time.  Will continue to monitor and assess for bipap needs.

## 2022-12-07 NOTE — Discharge Summary (Signed)
Physician Discharge Summary  Jillian [name] INO:[id number] DOB: 06/04/1936 DOA: 11/28/2022  PCP: [name], MD  Admit date: 11/28/2022 Discharge date: 12/08/2022  Admitted From: Home Disposition: Home with home hospice  Recommendations for Outpatient Follow-up:  As per hospice program  Home Health: N/A Equipment/Devices: Provided by hospice  Discharge Condition: Fair CODE STATUS: DNR Diet recommendation: Regular diet, aspiration precautions, low-salt intake.  Discharge summary: 87 year old with history of CKD stage IV, chronic diastolic heart failure, stroke, hypertension, paroxysmal A-fib on Eliquis presented to the emergency room from home on 1/14 with respiratory distress.  In the emergency room she was tachypneic but afebrile.  Initially placed on BiPAP and treated with bronchodilators and steroids.  Chest x-ray consistent with fluid overload.  She was also found with rapid A-fib, she was cardioverted in the emergency room and rate was controlled.  Subsequently after cardioversion she went into hypotensive episode and was started on peripheral Levophed and transferred to ICU on BiPAP and peripheral Levophed.   1/14 admitted to [facility] resp distress on bipap; afib rvr cardioverted then hypotensive and bradycardic placed on levo 1/15 Started Tamiflu for positive flu PCR.  Did not respond to IV Lasix 1/16 started IV fluids for AKI 1/18, intermittently short of breath with pulmonary edema on chest x-ray.  Using BiPAP as needed.  Nephrology consulted. 1/20, remains debilitated very frail.  Renal function somewhat improving.  Persistently hypertensive. 1/21, developed rapid A-fib.  Tachypnea.  Started on amiodarone infusion, rate controlled and changed to oral amiodarone to prepare to go home.  1/22 patient and family desire to go home with home hospice program.  Stable for discharge today with hospice in place.  # Acute hypoxemic and hypercapnic respiratory failure: In the setting of  influenza A infection and fluid overload.  Patient was treated with as needed BiPAP and oxygen support system.  She did much good clinical recovery.  Completed Tamiflu.  She received steroid therapy and bronchodilator therapy.  Today on room air. Completed steroid therapy.  Mostly on room air at rest.  # Acute kidney injury on chronic kidney disease stage IV: Known baseline creatinine of 2.2.  Urine output is adequate now.  Discharged on oral Lasix. Followed by nephrology.  Not a dialysis candidate.   # Essential hypertension:  On amlodipine and hydralazine.  Will continue current doses of amlodipine, hydralazine and metoprolol.   # Recurrent A-fib with RVR: Cardioversion in the emergency room followed by bradycardia and hypotension.  1/21, after maintaining sinus rhythm patient developed rapid A-fib . Rate is controlled but occasionally tachycardic. Followed by cardiology. Since planning to send home with hospice, will send home with amiodarone 200 mg twice daily for 7 days and then subsequently amiodarone 200 mg daily.  Metoprolol 25 mg twice daily.   Acute metabolic encephalopathy due to hypercapnia: Improved.  Chronic anemia and thrombocytopenia: Stable.   Symptoms are optimized and patient is stable today to go home with family along with home hospice in place.   Discharge Diagnoses:  Principal Problem:   Acute respiratory failure with hypercapnia [facility])    Discharge Instructions  Discharge Instructions     Diet general   Complete by: As directed    For home use only DME Nebulizer machine   Complete by: As directed    Patient needs a nebulizer to treat with the following condition: Respiratory failure with hypoxia and hypercapnia (HCC)   Length of Need: Lifetime   Increase activity slowly   Complete by: As directed  No wound care   Complete by: As directed       Allergies as of 12/08/2022       Reactions   Other Other (See Comments)   "Tapes tears off my skin"    Latex Other (See Comments)   "Blisters my skin and tears it off"- skin burns, too   Penicillins Nausea Only   Tape Other (See Comments)   "Tapes tears off my skin"   Aspirin Other (See Comments)   Irritates the stomach   Atorvastatin Other (See Comments)   Caused fatigue   Diltiazem Hcl Nausea And Vomiting        Medication List     STOP taking these medications    carvedilol 25 MG tablet Commonly known as: COREG   chlorthalidone 25 MG tablet Commonly known as: HYGROTON   IRON PO   rosuvastatin 10 MG tablet Commonly known as: CRESTOR       TAKE these medications    amiodarone 200 MG tablet Commonly known as: PACERONE Take 1 tablet (200 mg total) by mouth 2 (two) times daily for 6 days.   amiodarone 200 MG tablet Commonly known as: PACERONE Take 1 tablet (200 mg total) by mouth daily. Start taking on: December 14, 2022   amLODipine 5 MG tablet Commonly known as: NORVASC Take 1 tablet (5 mg total) by mouth daily.   apixaban 2.5 MG Tabs tablet Commonly known as: ELIQUIS Take 1 tablet (2.5 mg total) by mouth 2 (two) times daily.   cholecalciferol 25 MCG (1000 UNIT) tablet Commonly known as: VITAMIN D3 Take 1,000 Units by mouth daily.   clotrimazole-betamethasone cream Commonly known as: LOTRISONE Apply topically 2 (two) times daily.   diphenhydrAMINE 25 mg capsule Commonly known as: BENADRYL Take 25 mg by mouth every 6 (six) hours as needed for itching or allergies.   furosemide 40 MG tablet Commonly known as: LASIX Take 1 tablet (40 mg total) by mouth daily. What changed:  medication strength how much to take when to take this   hydrALAZINE 50 MG tablet Commonly known as: APRESOLINE Take 1 tablet (50 mg total) by mouth every 8 (eight) hours. What changed: when to take this   HYDROcodone bit-homatropine 5-1.5 MG/5ML syrup Commonly known as: Hycodan Take 5 mLs by mouth every 6 (six) hours as needed for cough.   levalbuterol 1.25 MG/0.5ML  nebulizer solution Commonly known as: XOPENEX Take 1.25 mg by nebulization every 6 (six) hours as needed for wheezing or shortness of breath.   LORazepam 0.5 MG tablet Commonly known as: ATIVAN Take 1 tablet (0.5 mg total) by mouth every 6 (six) hours as needed for up to 14 days for anxiety.   metoprolol tartrate 50 MG tablet Commonly known as: LOPRESSOR Take 1 tablet (50 mg total) by mouth 2 (two) times daily.   oxyCODONE 5 MG immediate release tablet Commonly known as: Roxicodone Take 1 tablet (5 mg total) by mouth every 4 (four) hours as needed for up to 5 days.   polyethylene glycol 17 g packet Commonly known as: MIRALAX / GLYCOLAX Take 17 g by mouth daily.   sennosides-docusate sodium 8.6-50 MG tablet Commonly known as: SENOKOT-S Take 3 tablets by mouth at bedtime. What changed:  when to take this reasons to take this   VITAMIN D PO Take 1 capsule by mouth every 7 (seven) days.               Durable Medical Equipment  (From admission, onward)  Start     Ordered   12/07/22 0000  For home use only DME Nebulizer machine       Question Answer Comment  Patient needs a nebulizer to treat with the following condition Respiratory failure with hypoxia and hypercapnia ([place])   Length of Need Lifetime      12/07/22 0911            Follow-up Information     [facility] Follow up.   Specialty: Hospice and Palliative Medicine Why: Home Hospice referral made for Hospice services and DME Contact information: [place] [phone]               Allergies  Allergen Reactions   Other Other (See Comments)    "Tapes tears off my skin"   Latex Other (See Comments)    "Blisters my skin and tears it off"- skin burns, too   Penicillins Nausea Only   Tape Other (See Comments)    "Tapes tears off my skin"   Aspirin Other (See Comments)    Irritates the stomach   Atorvastatin Other (See Comments)     Caused fatigue   Diltiazem Hcl Nausea And Vomiting    Consultations: Palliative care Cardiology PCCM   Procedures/Studies: DG CHEST PORT 1 VIEW  Result Date: 12/05/2022 CLINICAL DATA:  Shortness of breath EXAM: PORTABLE CHEST 1 VIEW COMPARISON:  Chest radiograph 12/02/2022 FINDINGS: Monitoring leads overlie the patient. Stable cardiomegaly. Tortuosity of the thoracic aorta. Probable small bilateral layering pleural effusions. Interval increase in airspace opacities throughout the right lung. Similar appearance of the left lung. No pneumothorax. IMPRESSION: 1. Interval increase in airspace opacities throughout the right lung which may represent edema or infection. 2. Small bilateral pleural effusions. Electronically Signed   By: Lovey Newcomer M.D.   On: 12/05/2022 11:31   DG Chest Port 1 View  Result Date: 12/02/2022 CLINICAL DATA:  Acute respiratory failure EXAM: PORTABLE CHEST 1 VIEW COMPARISON:  Chest x-ray dated November 30, 2022 FINDINGS: Unchanged cardiomegaly. Mild diffuse interstitial opacities, increased when compared with the prior exam. Small bilateral pleural effusions, similar to prior. No evidence of pneumothorax. IMPRESSION: Mild diffuse interstitial opacities, increased when compared with the prior exam, likely due to worsened pulmonary edema. Electronically Signed   By: Yetta Glassman M.D.   On: 12/02/2022 08:15   DG Chest Port 1 View  Result Date: 11/30/2022 CLINICAL DATA:  5626 Acute respiratory failure ([employer]) 5626 EXAM: PORTABLE CHEST 1 VIEW COMPARISON:  11/28/2022 chest radiograph. FINDINGS: Stable cardiomediastinal silhouette with moderate cardiomegaly. No pneumothorax. No pleural effusion. Streaky bibasilar lung opacities, slightly worsened, favor atelectasis. Mild-to-moderate elevation of the left hemidiaphragm, similar. No overt pulmonary edema. IMPRESSION: 1. Stable moderate cardiomegaly without overt pulmonary edema. 2. Streaky bibasilar lung opacities, slightly  worsened, favor atelectasis. 3. Stable mild-to-moderate elevation of the left hemidiaphragm. Electronically Signed   By: Ilona Sorrel M.D.   On: 11/30/2022 08:50   ECHOCARDIOGRAM COMPLETE  Result Date: 11/29/2022    ECHOCARDIOGRAM REPORT   Patient Name:   [facility] Date of Exam: 11/29/2022 Medical Rec #:  [id number]     Height:       61.0 in Accession #:    [id number]    Weight:       189.8 lb Date of Birth:  12-22-1935     BSA:          1.847 m Patient Age:    87 years      BP:  124/58 mmHg Patient Gender: F             HR:           89 bpm. Exam Location:  Inpatient Procedure: 2D Echo, Color Doppler, Cardiac Doppler and Intracardiac            Opacification Agent Indications:    I50.9* Heart failure (unspecified)  History:        Patient has prior history of Echocardiogram examinations, most                 recent 07/08/2021. Arrythmias:Atrial Fibrillation; Risk                 Factors:Hypertension and Dyslipidemia.  Sonographer:    Raquel Sarna Senior RDCS Referring Phys: [id number] Mick Sell  Sonographer Comments: Scanned upright on bipap. IMPRESSIONS  1. Left ventricular ejection fraction, by estimation, is 55%. The left ventricle has normal function. The left ventricle has no regional wall motion abnormalities. Left ventricular diastolic parameters are indeterminate.  2. Right ventricular systolic function is normal. The right ventricular size is normal.  3. Left atrial size was mildly dilated.  4. The mitral valve is abnormal. Moderate mitral valve regurgitation. No evidence of mitral stenosis.  5. The aortic valve is tricuspid. There is moderate calcification of the aortic valve. There is moderate thickening of the aortic valve. Aortic valve regurgitation is not visualized. Aortic valve sclerosis is present, with no evidence of aortic valve stenosis.  6. The inferior vena cava is dilated in size with >50% respiratory variability, suggesting right atrial pressure of 8 mmHg. FINDINGS  Left Ventricle:  Left ventricular ejection fraction, by estimation, is 55%. The left ventricle has normal function. The left ventricle has no regional wall motion abnormalities. Definity contrast agent was given IV to delineate the left ventricular endocardial borders. The left ventricular internal cavity size was normal in size. There is no left ventricular hypertrophy. Left ventricular diastolic parameters are indeterminate. Right Ventricle: The right ventricular size is normal. No increase in right ventricular wall thickness. Right ventricular systolic function is normal. Left Atrium: Left atrial size was mildly dilated. Right Atrium: Right atrial size was normal in size. Pericardium: There is no evidence of pericardial effusion. Mitral Valve: The mitral valve is abnormal. There is mild thickening of the mitral valve leaflet(s). There is mild calcification of the mitral valve leaflet(s). Moderate mitral valve regurgitation. No evidence of mitral valve stenosis. Tricuspid Valve: The tricuspid valve is normal in structure. Tricuspid valve regurgitation is mild . No evidence of tricuspid stenosis. Aortic Valve: The aortic valve is tricuspid. There is moderate calcification of the aortic valve. There is moderate thickening of the aortic valve. Aortic valve regurgitation is not visualized. Aortic valve sclerosis is present, with no evidence of aortic valve stenosis. Pulmonic Valve: The pulmonic valve was normal in structure. Pulmonic valve regurgitation is not visualized. No evidence of pulmonic stenosis. Aorta: The aortic root is normal in size and structure. Venous: The inferior vena cava is dilated in size with greater than 50% respiratory variability, suggesting right atrial pressure of 8 mmHg. IAS/Shunts: No atrial level shunt detected by color flow Doppler.  LEFT VENTRICLE PLAX 2D LVIDd:         5.00 cm   Diastology LVIDs:         3.50 cm   LV e' medial:    4.03 cm/s LV PW:         1.10 cm   LV E/e' medial:  29.8  LV IVS:         1.00 cm   LV e' lateral:   5.43 cm/s LVOT diam:     1.80 cm   LV E/e' lateral: 22.1 LV SV:         48 LV SV Index:   26 LVOT Area:     2.54 cm  RIGHT VENTRICLE RV S prime:     8.32 cm/s TAPSE (M-mode): 1.5 cm LEFT ATRIUM             Index        RIGHT ATRIUM           Index LA diam:        3.80 cm 2.06 cm/m   RA Area:     18.00 cm LA Vol (A2C):   76.0 ml 41.14 ml/m  RA Volume:   44.00 ml  23.82 ml/m LA Vol (A4C):   59.3 ml 32.10 ml/m LA Biplane Vol: 66.8 ml 36.16 ml/m  AORTIC VALVE LVOT Vmax:   82.70 cm/s LVOT Vmean:  56.800 cm/s LVOT VTI:    0.190 m  AORTA Ao Root diam: 2.80 cm Ao Asc diam:  3.60 cm MITRAL VALVE MV Area (PHT): 3.06 cm       SHUNTS MV Decel Time: 248 msec       Systemic VTI:  0.19 m MR Peak grad:    87.6 mmHg    Systemic Diam: 1.80 cm MR Mean grad:    60.0 mmHg MR Vmax:         468.00 cm/s MR Vmean:        372.0 cm/s MR PISA:         1.57 cm MR PISA Eff ROA: 13 mm MR PISA Radius:  0.50 cm MV E velocity: 120.00 cm/s Jenkins Rouge MD Electronically signed by Jenkins Rouge MD Signature Date/Time: 11/29/2022/9:56:54 AM    Final    DG Chest Port 1 View  Result Date: 11/28/2022 CLINICAL DATA:  SOB EXAM: PORTABLE CHEST - 1 VIEW COMPARISON:  03/08/2022 FINDINGS: Cardiac silhouette is prominent. There is pulmonary interstitial prominence with vascular congestion. No focal consolidation. No pneumothorax or pleural effusion identified. Aorta is ectatic and calcified. IMPRESSION: Findings suggest CHF. Electronically Signed   By: Sammie Bench M.D.   On: 11/28/2022 20:18   (Echo, Carotid, EGD, Colonoscopy, ERCP)    Subjective: Patient seen and examined.  No overnight events.  She is sitting in chair today.  Looks comfortable.  She is on room air.  She is able to follow more commands and take deep breathing today. Telemetry shows persistent a flutter with occasional increased heart rate. Patient is looking forward to go home desperately.  She was very delighted to know that she is going home  today.   Discharge Exam: Vitals:   12/08/22 0307 12/08/22 0736  BP: (!) 143/73 132/88  Pulse: 92 (!) 120  Resp: 16 18  Temp: 97.7 F (36.5 C) (!) 97.3 F (36.3 C)  SpO2: 96% 92%   Vitals:   12/07/22 2322 12/08/22 0307 12/08/22 0500 12/08/22 0736  BP: (!) 147/90 (!) 143/73  132/88  Pulse: (!) 108 92  (!) 120  Resp: 17 16  18   Temp: (!) 97.4 F (36.3 C) 97.7 F (36.5 C)  (!) 97.3 F (36.3 C)  TempSrc: Axillary Oral  Axillary  SpO2:  96%  92%  Weight:   81.7 kg   Height:        General: Pt is alert, awake,  frail and debilitated.  Chronically sick looking.  Not in any distress. Mostly oriented today. Cardiovascular: Irregularly irregular, S1/S2 +, no rubs, no gallops Respiratory: CTA bilaterally, no wheezing, no rhonchi Abdominal: Soft, NT, ND, bowel sounds + Extremities: 1+ bilateral pedal edema.    The results of significant diagnostics from this hospitalization (including imaging, microbiology, ancillary and laboratory) are listed below for reference.     Microbiology: Recent Results (from the past 240 hour(s))  MRSA Next Gen by PCR, Nasal     Status: None   Collection Time: 11/29/22 12:22 AM   Specimen: Nasal Mucosa; Nasal Swab  Result Value Ref Range Status   MRSA by PCR Next Gen NOT DETECTED NOT DETECTED Final    Comment: (NOTE) The GeneXpert MRSA Assay (FDA approved for NASAL specimens only), is one component of a comprehensive MRSA colonization surveillance program. It is not intended to diagnose MRSA infection nor to guide or monitor treatment for MRSA infections. Test performance is not FDA approved in patients less than 41 years old. Performed at [facility], [place] [address] [place],  [address]   Resp panel by RT-PCR (RSV, Flu A&B, Covid) Anterior Nasal Swab     Status: Abnormal   Collection Time: 11/29/22 12:02 PM   Specimen: Anterior Nasal Swab  Result Value Ref Range Status   SARS Coronavirus 2 by RT PCR NEGATIVE NEGATIVE Final     Comment: (NOTE) SARS-CoV-2 target nucleic acids are NOT DETECTED.  The SARS-CoV-2 RNA is generally detectable in upper respiratory specimens during the acute phase of infection. The lowest concentration of SARS-CoV-2 viral copies this assay can detect is 138 copies/mL. A negative result does not preclude SARS-Cov-2 infection and should not be used as the sole basis for treatment or other patient management decisions. A negative result may occur with  improper specimen collection/handling, submission of specimen other than nasopharyngeal swab, presence of viral mutation(s) within the areas targeted by this assay, and inadequate number of viral copies(<138 copies/mL). A negative result must be combined with clinical observations, patient history, and epidemiological information. The expected result is Negative.  Fact Sheet for Patients:  EntrepreneurPulse.com.au  Fact Sheet for Healthcare Providers:  IncredibleEmployment.be  This test is no t yet approved or cleared by the [place] FDA and  has been authorized for detection and/or diagnosis of SARS-CoV-2 by FDA under an Emergency Use Authorization (EUA). This EUA will remain  in effect (meaning this test can be used) for the duration of the COVID-19 declaration under Section 564(b)(1) of the Act, 21 U.S.C.section 360bbb-3(b)(1), unless the authorization is terminated  or revoked sooner.       Influenza A by PCR POSITIVE (A) NEGATIVE Final   Influenza B by PCR NEGATIVE NEGATIVE Final    Comment: (NOTE) The Xpert Xpress SARS-CoV-2/FLU/RSV plus assay is intended as an aid in the diagnosis of influenza from Nasopharyngeal swab specimens and should not be used as a sole basis for treatment. Nasal washings and aspirates are unacceptable for Xpert Xpress SARS-CoV-2/FLU/RSV testing.  Fact Sheet for Patients: EntrepreneurPulse.com.au  Fact Sheet for Healthcare  Providers: IncredibleEmployment.be  This test is not yet approved or cleared by the [place] FDA and has been authorized for detection and/or diagnosis of SARS-CoV-2 by FDA under an Emergency Use Authorization (EUA). This EUA will remain in effect (meaning this test can be used) for the duration of the COVID-19 declaration under Section 564(b)(1) of the Act, 21 U.S.C. section 360bbb-3(b)(1), unless the authorization is terminated or revoked.  Resp Syncytial Virus by PCR NEGATIVE NEGATIVE Final    Comment: (NOTE) Fact Sheet for Patients: EntrepreneurPulse.com.au  Fact Sheet for Healthcare Providers: IncredibleEmployment.be  This test is not yet approved or cleared by the [place] FDA and has been authorized for detection and/or diagnosis of SARS-CoV-2 by FDA under an Emergency Use Authorization (EUA). This EUA will remain in effect (meaning this test can be used) for the duration of the COVID-19 declaration under Section 564(b)(1) of the Act, 21 U.S.C. section 360bbb-3(b)(1), unless the authorization is terminated or revoked.  Performed at [facility], [place] [address], [place], [place] [address]      Labs: BNP (last 3 results) Recent Labs    02/22/22 0033 02/23/22 0219 11/28/22 1928  BNP 379.7* 468.4* 9,326.7*   Basic Metabolic Panel: Recent Labs  Lab 12/02/22 0449 12/02/22 0449 12/03/22 1014 12/04/22 0802 12/05/22 0500 12/06/22 0423 12/07/22 0555 12/08/22 0705  NA 137  --  138 141 143 144 145 144  K 4.7  --  4.5 4.3 3.9 3.6 3.7 3.6  CL 102  --  102 102 102 102 103 101  CO2 25  --  28 29 32 33* 33* 32  GLUCOSE 116*  --  105* 131* 85 109* 144* 127*  BUN 81*  --  85* 83* 79* 79* 77* 71*  CREATININE 4.42*  --  3.96* 3.65* 3.37* 3.37* 3.31* 3.18*  CALCIUM 8.2*  --  8.3* 8.6* 8.6* 8.6* 8.4* 8.6*  MG 2.8*  --  2.7*  --   --   --   --   --   PHOS  --    < > 4.2 4.3 3.6 3.9 3.8 3.4   < > =  values in this interval not displayed.   Liver Function Tests: Recent Labs  Lab 12/02/22 0449 12/03/22 1014 12/04/22 0802 12/05/22 0500 12/06/22 0423 12/07/22 0555 12/08/22 0705  AST 37  --   --   --   --   --   --   ALT 61*  --   --   --   --   --   --   ALKPHOS 113  --   --   --   --   --   --   BILITOT 0.5  --   --   --   --   --   --   PROT 6.0*  --   --   --   --   --   --   ALBUMIN 2.4*   < > 2.5* 2.2* 2.2* 2.1* 2.5*   < > = values in this interval not displayed.   No results for input(s): "LIPASE", "AMYLASE" in the last 168 hours. No results for input(s): "AMMONIA" in the last 168 hours. CBC: Recent Labs  Lab 12/01/22 1015 12/02/22 0449 12/03/22 1014 12/06/22 0423  WBC  --  8.2 8.8 9.4  NEUTROABS  --   --  7.1  --   HGB 9.9* 9.0* 9.4* 10.5*  HCT 29.0* 29.1* 29.8* 32.6*  MCV  --  88.2 86.9 85.3  PLT  --  140* 137* 178   Cardiac Enzymes: No results for input(s): "CKTOTAL", "CKMB", "CKMBINDEX", "TROPONINI" in the last 168 hours. BNP: Invalid input(s): "POCBNP" CBG: Recent Labs  Lab 12/06/22 1931 12/07/22 0114 12/07/22 0728 12/07/22 1102 12/07/22 1540  GLUCAP 170* 204* 136* 113* 198*   D-Dimer No results for input(s): "DDIMER" in the last 72 hours. Hgb A1c No results for input(s): "  HGBA1C" in the last 72 hours. Lipid Profile No results for input(s): "CHOL", "HDL", "LDLCALC", "TRIG", "CHOLHDL", "LDLDIRECT" in the last 72 hours. Thyroid function studies No results for input(s): "TSH", "T4TOTAL", "T3FREE", "THYROIDAB" in the last 72 hours.  Invalid input(s): "FREET3" Anemia work up No results for input(s): "VITAMINB12", "FOLATE", "FERRITIN", "TIBC", "IRON", "RETICCTPCT" in the last 72 hours. Urinalysis    Component Value Date/Time   COLORURINE YELLOW 11/29/2022 0109   APPEARANCEUR CLOUDY (A) 11/29/2022 0109   LABSPEC 1.013 11/29/2022 0109   PHURINE 5.0 11/29/2022 0109   GLUCOSEU NEGATIVE 11/29/2022 0109   HGBUR NEGATIVE 11/29/2022 0109    BILIRUBINUR NEGATIVE 11/29/2022 0109   KETONESUR NEGATIVE 11/29/2022 0109   PROTEINUR 100 (A) 11/29/2022 0109   NITRITE NEGATIVE 11/29/2022 0109   LEUKOCYTESUR NEGATIVE 11/29/2022 0109   Sepsis Labs Recent Labs  Lab 12/02/22 0449 12/03/22 1014 12/06/22 0423  WBC 8.2 8.8 9.4   Microbiology Recent Results (from the past 240 hour(s))  MRSA Next Gen by PCR, Nasal     Status: None   Collection Time: 11/29/22 12:22 AM   Specimen: Nasal Mucosa; Nasal Swab  Result Value Ref Range Status   MRSA by PCR Next Gen NOT DETECTED NOT DETECTED Final    Comment: (NOTE) The GeneXpert MRSA Assay (FDA approved for NASAL specimens only), is one component of a comprehensive MRSA colonization surveillance program. It is not intended to diagnose MRSA infection nor to guide or monitor treatment for MRSA infections. Test performance is not FDA approved in patients less than 50 years old. Performed at [facility], [place] [address], [place], [place] [address]   Resp panel by RT-PCR (RSV, Flu A&B, Covid) Anterior Nasal Swab     Status: Abnormal   Collection Time: 11/29/22 12:02 PM   Specimen: Anterior Nasal Swab  Result Value Ref Range Status   SARS Coronavirus 2 by RT PCR NEGATIVE NEGATIVE Final    Comment: (NOTE) SARS-CoV-2 target nucleic acids are NOT DETECTED.  The SARS-CoV-2 RNA is generally detectable in upper respiratory specimens during the acute phase of infection. The lowest concentration of SARS-CoV-2 viral copies this assay can detect is 138 copies/mL. A negative result does not preclude SARS-Cov-2 infection and should not be used as the sole basis for treatment or other patient management decisions. A negative result may occur with  improper specimen collection/handling, submission of specimen other than nasopharyngeal swab, presence of viral mutation(s) within the areas targeted by this assay, and inadequate number of viral copies(<138 copies/mL). A negative result must be  combined with clinical observations, patient history, and epidemiological information. The expected result is Negative.  Fact Sheet for Patients:  EntrepreneurPulse.com.au  Fact Sheet for Healthcare Providers:  IncredibleEmployment.be  This test is no t yet approved or cleared by the [place] FDA and  has been authorized for detection and/or diagnosis of SARS-CoV-2 by FDA under an Emergency Use Authorization (EUA). This EUA will remain  in effect (meaning this test can be used) for the duration of the COVID-19 declaration under Section 564(b)(1) of the Act, 21 U.S.C.section 360bbb-3(b)(1), unless the authorization is terminated  or revoked sooner.       Influenza A by PCR POSITIVE (A) NEGATIVE Final   Influenza B by PCR NEGATIVE NEGATIVE Final    Comment: (NOTE) The Xpert Xpress SARS-CoV-2/FLU/RSV plus assay is intended as an aid in the diagnosis of influenza from Nasopharyngeal swab specimens and should not be used as a sole basis for treatment. Nasal washings and aspirates  are unacceptable for Xpert Xpress SARS-CoV-2/FLU/RSV testing.  Fact Sheet for Patients: EntrepreneurPulse.com.au  Fact Sheet for Healthcare Providers: IncredibleEmployment.be  This test is not yet approved or cleared by the [place] FDA and has been authorized for detection and/or diagnosis of SARS-CoV-2 by FDA under an Emergency Use Authorization (EUA). This EUA will remain in effect (meaning this test can be used) for the duration of the COVID-19 declaration under Section 564(b)(1) of the Act, 21 U.S.C. section 360bbb-3(b)(1), unless the authorization is terminated or revoked.     Resp Syncytial Virus by PCR NEGATIVE NEGATIVE Final    Comment: (NOTE) Fact Sheet for Patients: EntrepreneurPulse.com.au  Fact Sheet for Healthcare Providers: IncredibleEmployment.be  This test is not  yet approved or cleared by the [place] FDA and has been authorized for detection and/or diagnosis of SARS-CoV-2 by FDA under an Emergency Use Authorization (EUA). This EUA will remain in effect (meaning this test can be used) for the duration of the COVID-19 declaration under Section 564(b)(1) of the Act, 21 U.S.C. section 360bbb-3(b)(1), unless the authorization is terminated or revoked.  Performed at [facility], [place] [address] [place], [place] [address]      Time coordinating discharge:  35 minutes  SIGNED:   Barb Merino, MD  [facility] 12/08/2022, 8:42 AM  Addendum 12/08/2022. 840 AM Care plan remains similar.  Should be able to transition today home with home hospice.  Added Hycodan cough syrup for additional achievement of comfort.

## 2022-12-07 NOTE — Progress Notes (Signed)
Occupational Therapy Treatment Patient Details Name: Jillian [name] MRN: [id number] DOB: Jun 13, 1936 Today's Date: 12/07/2022   History of present illness 87 yo female admitted 1/14 with respiratory failure, Afib with RVR and flu (+). Cardioversion 1/14. PMhx; CKD, CHF, CVA, HTN, Afib   OT comments  Pt progressing towards OT goals this session. Eager for OOB and ADL activities. Continues to require cueing for safety as Pt has decreased awareness of deficits as well as cues for problem solving with familiar ADL tasks. Pt able to perform transfers and bathroom mobility with Min A. Youngest daughter present throughout and helpful. Pt SpO2 >89% throughout session on RA and HR jumping and inconsistent throughout session from 111-171. Pt Asymptomatic and clinically presented [facility]. POC remains appropriate and OT will continue to follow acutely - although I believe the plan is for DC today.    Recommendations for follow up therapy are one component of a multi-disciplinary discharge planning process, led by the attending physician.  Recommendations may be updated based on patient status, additional functional criteria and insurance authorization.    Follow Up Recommendations  Other (comment) (home with hospice)     Assistance Recommended at Discharge Frequent or constant Supervision/Assistance  Patient can return home with the following  Assist for transportation;Assistance with cooking/housework;A lot of help with bathing/dressing/bathroom;A little help with walking and/or transfers;Direct supervision/assist for financial management;Direct supervision/assist for medications management   Equipment Recommendations  BSC/3in1;Wheelchair (measurements OT);Wheelchair cushion (measurements OT);Hospital bed    Recommendations for Other Services      Precautions / Restrictions Precautions Precautions: Fall Precaution Comments: watch sats and HR Restrictions Weight Bearing Restrictions: No        Mobility Bed Mobility Overal bed mobility: Needs Assistance Bed Mobility: Supine to Sit     Supine to sit: Min guard, HOB elevated     General bed mobility comments: increased time    Transfers Overall transfer level: Needs assistance Equipment used: Rolling walker (2 wheels) Transfers: Sit to/from Stand Sit to Stand: Min assist           General transfer comment: min A to stabilize RW for stand, guarding with cues for safety, assist for lines and RW present     Balance Overall balance assessment: Needs assistance Sitting-balance support: No upper extremity supported, Feet supported Sitting balance-Leahy Scale: Good     Standing balance support: Bilateral upper extremity supported, Reliant on assistive device for balance Standing balance-Leahy Scale: Poor Standing balance comment: RW for standing                           ADL either performed or assessed with clinical judgement   ADL Overall ADL's : Needs assistance/impaired Eating/Feeding: Set up;Sitting Eating/Feeding Details (indicate cue type and reason): in recliner Grooming: Oral care;Wash/dry face;Wash/dry hands;Standing;Minimal assistance;Cueing for sequencing Grooming Details (indicate cue type and reason): standing at sink             Lower Body Dressing: Moderate assistance;Sit to/from stand   Toilet Transfer: Minimal assistance;Ambulation;Rolling walker (2 wheels);Cueing for sequencing;Cueing for safety Toilet Transfer Details (indicate cue type and reason): into bathroom Toileting- Clothing Manipulation and Hygiene: Set up;Sit to/from stand Toileting - Clothing Manipulation Details (indicate cue type and reason): standing from toilet     Functional mobility during ADLs: Minimal assistance;Cueing for sequencing;Cueing for safety;Rolling walker (2 wheels)      Extremity/Trunk Assessment Upper Extremity Assessment Upper Extremity Assessment: Generalized weakness   Lower Extremity  Assessment Lower Extremity  Assessment: Defer to PT evaluation        Vision       Perception     Praxis      Cognition Arousal/Alertness: Awake/alert   Overall Cognitive Status: Impaired/Different from baseline Area of Impairment: Safety/judgement, Awareness, Problem solving                         Safety/Judgement: Decreased awareness of safety, Decreased awareness of deficits Awareness: Emergent Problem Solving: Slow processing, Requires verbal cues General Comments: Pt in very good spirits today and eager for OOB. Required cues for safety with RW (although does not typically use one) and problem solving with basic grooming tasks also had problems operating cell phone when OT was entering room        Exercises      Shoulder Instructions       General Comments SpO2 >89% on RA throughout session at sitting or with activity. HR up to 171 for brief moments, very all over the place from 111-171 . Pt Asymptomatic and clinically presenting [facility]    Pertinent Vitals/ Pain       Pain Assessment Pain Assessment: No/denies pain Pain Intervention(s): Monitored during session, Repositioned  Home Living                                          Prior Functioning/Environment              Frequency  Min 2X/week        Progress Toward Goals  OT Goals(current goals can now be found in the care plan section)  Progress towards OT goals: Progressing toward goals  Acute Rehab OT Goals Patient Stated Goal: get out of this bed! OT Goal Formulation: With patient/family Time For Goal Achievement: 12/17/22 Potential to Achieve Goals: [place]  Plan Discharge plan remains appropriate    Co-evaluation                 AM-PAC OT "6 Clicks" Daily Activity     Outcome Measure   Help from another person eating meals?: A Little Help from another person taking care of personal grooming?: A Little Help from another person toileting, which includes  using toliet, bedpan, or urinal?: A Lot Help from another person bathing (including washing, rinsing, drying)?: A Lot Help from another person to put on and taking off regular upper body clothing?: A Little Help from another person to put on and taking off regular lower body clothing?: A Lot 6 Click Score: 15    End of Session Equipment Utilized During Treatment: Rolling walker (2 wheels);Gait belt  OT Visit Diagnosis: Unsteadiness on feet (R26.81);Muscle weakness (generalized) (M62.81)   Activity Tolerance Patient tolerated treatment well   Patient Left in chair;with call bell/phone within reach;with chair alarm set   Nurse Communication Mobility status (HR)        Time: 2330-0762 OT Time Calculation (min): 24 min  Charges: OT General Charges $OT Visit: 1 Visit OT Treatments $Self Care/Home Management : 8-22 mins $Therapeutic Activity: 8-22 mins  Jesse Sans OTR/L Acute Rehabilitation Services Office: [place] 12/07/2022, 12:34 PM

## 2022-12-07 NOTE — TOC Progression Note (Signed)
Transition of Care (TOC) - Progression Note  [name] RN,BSN Transitions of Care Unit 4NP (Non Trauma)- RN Case Manager See Treatment Team for direct Phone #   Patient Details  Name: Cinthya Bors MRN: [id number] Date of Birth: [birth year]  Transition of Care [facility]) CM/SW Contact  Dahlia Client Romeo Rabon, RN Phone Number: 12/07/2022, 2:13 PM  Clinical Narrative:    CM has received msg from [facility] that DME has been confirmed delivered to the home.  Per Hospice daughters are still making arrangements to bring patient home w/ Hospice and are requesting transition home tomorrow. Per Hospice this also will allow [facility] to arrange to make same day Hospice visit for day of discharge.   MD updated and agreeable for pt to stay overnight and discharge in am with EMS transport home.  Bedside RN also updated on plan.    Expected Discharge Plan: Home w Hospice Care Barriers to Discharge: Other (must enter comment) (awaiting family to get things in place for Hospice)  Expected Discharge Plan and Services In-house Referral: Clinical Social Work Discharge Planning Services: [place] Acute Care Choice: Hospice Living arrangements for the past 2 months: [place] Expected Discharge Date: 12/08/22               DME Arranged: Hospice Equipment Package Others DME Agency: [employer]     Representative spoke with at DME Agency: per [place]: Hospice and [place] Date [place]: 12/06/22 Time [place]: 0539 Representative spoke with at [place]: [employer] (SDOH) Interventions SDOH Screenings   Depression (PHQ2-9): Low Risk  (11/11/2022)  Tobacco Use: Low Risk  (10/11/2022)    Readmission Risk Interventions     No data to display

## 2022-12-08 LAB — RENAL FUNCTION PANEL
Albumin: 2.5 g/dL — ABNORMAL LOW (ref 3.5–5.0)
Anion gap: 11 (ref 5–15)
BUN: 71 mg/dL — ABNORMAL HIGH (ref 8–23)
CO2: 32 mmol/L (ref 22–32)
Calcium: 8.6 mg/dL — ABNORMAL LOW (ref 8.9–10.3)
Chloride: 101 mmol/L (ref 98–111)
Creatinine, Ser: 3.18 mg/dL — ABNORMAL HIGH (ref 0.44–1.00)
GFR, Estimated: 14 mL/min — ABNORMAL LOW (ref >60)
Glucose, Bld: 127 mg/dL — ABNORMAL HIGH (ref 70–99)
Phosphorus: 3.4 mg/dL (ref 2.5–4.6)
Potassium: 3.6 mmol/L (ref 3.5–5.1)
Sodium: 144 mmol/L (ref 135–145)

## 2022-12-08 MED ORDER — HYDROCODONE BIT-HOMATROP MBR 5-1.5 MG/5ML PO SOLN: 5.0000 mL | Freq: Four times a day (QID) | ORAL | 0 refills | Status: DC | PRN

## 2022-12-08 NOTE — Plan of Care (Signed)
Going home with daughter and hospice care today

## 2022-12-08 NOTE — Progress Notes (Signed)
Patient seen and examined.  Could not be discharged yesterday because family wanted more family support system to be arranged at home.  Discharged reviewed.  Medication reconciliation renewed.  Will also prescribe some Hycodan cough syrup as additional medications. Heart rate remains suboptimally controlled but patient is asymptomatic.  Hopefully with her improvement of breathing, heart rate will be controlled.  Focuses on comfort which is achieved today.  Stable for transition home with home hospice.  Will need ambulance transfer.

## 2022-12-08 NOTE — Progress Notes (Signed)
Occupational Therapy Treatment Patient Details Name: Jillian [name] MRN: [id number] DOB: February 11, 1936 Today's Date: 12/08/2022   History of present illness 87 yo female admitted 1/14 with respiratory failure, Afib with RVR and flu (+). Cardioversion 1/14. PMhx; CKD, CHF, CVA, HTN, Afib   OT comments  Pt progressing towards OT goals this session. Focused on toilet transfers, seated peri care and safety with RW, standing grooming. Pt overall min guard assist with multimodal cues for safety and sequencing. Pt very excited because she is going home today! Daughter present and very supportive. OT POC remains appropriate at this time.    Recommendations for follow up therapy are one component of a multi-disciplinary discharge planning process, led by the attending physician.  Recommendations may be updated based on patient status, additional functional criteria and insurance authorization.    Follow Up Recommendations  Other (comment) (home with hospice)     Assistance Recommended at Discharge Frequent or constant Supervision/Assistance  Patient can return home with the following  Assist for transportation;Assistance with cooking/housework;A lot of help with bathing/dressing/bathroom;A little help with walking and/or transfers;Direct supervision/assist for financial management;Direct supervision/assist for medications management   Equipment Recommendations  BSC/3in1;Wheelchair (measurements OT);Wheelchair cushion (measurements OT);Hospital bed    Recommendations for Other Services      Precautions / Restrictions Precautions Precautions: Fall Precaution Comments: watch sats and HR Restrictions Weight Bearing Restrictions: No       Mobility Bed Mobility Overal bed mobility: Needs Assistance Bed Mobility: Supine to Sit, Sit to Supine     Supine to sit: Min guard, HOB elevated Sit to supine: Min guard   General bed mobility comments: increased time, use of bed rails     Transfers Overall transfer level: Needs assistance Equipment used: Rolling walker (2 wheels) Transfers: Sit to/from Stand Sit to Stand: Min assist           General transfer comment: min A to stabilize RW for stand, guarding with cues for safety     Balance Overall balance assessment: Needs assistance Sitting-balance support: No upper extremity supported, Feet supported Sitting balance-Leahy Scale: Good     Standing balance support: Bilateral upper extremity supported, Reliant on assistive device for balance Standing balance-Leahy Scale: Poor Standing balance comment: RW for standing                           ADL either performed or assessed with clinical judgement   ADL Overall ADL's : Needs assistance/impaired     Grooming: Standing;Wash/dry hands;Min guard Grooming Details (indicate cue type and reason): standing at sink                 Toilet Transfer: Minimal assistance;Ambulation;Rolling walker (2 wheels);Cueing for sequencing;Cueing for safety Toilet Transfer Details (indicate cue type and reason): into bathroom Toileting- Clothing Manipulation and Hygiene: Set up;Sitting/lateral lean Toileting - Clothing Manipulation Details (indicate cue type and reason): sitting on toilet, utilized warm wash cloth     Functional mobility during ADLs: Minimal assistance;Cueing for sequencing;Cueing for safety;Rolling walker (2 wheels)      Extremity/Trunk Assessment Upper Extremity Assessment Upper Extremity Assessment: Generalized weakness   Lower Extremity Assessment Lower Extremity Assessment: Defer to PT evaluation        Vision       Perception     Praxis      Cognition Arousal/Alertness: Awake/alert   Overall Cognitive Status: Impaired/Different from baseline Area of Impairment: Safety/judgement, Awareness, Problem solving  Safety/Judgement: Decreased awareness of safety, Decreased awareness of  deficits Awareness: Emergent Problem Solving: Slow processing, Requires verbal cues General Comments: Pt in very good spirits today and eager for OOB. Required cues for safety with RW (although does not typically use one) and problem solving with basic grooming tasks also had problems operating cell phone when OT was entering room        Exercises      Shoulder Instructions       General Comments      Pertinent Vitals/ Pain       Pain Assessment Pain Assessment: No/denies pain Pain Intervention(s): Monitored during session  Home Living                                          Prior Functioning/Environment              Frequency  Min 2X/week        Progress Toward Goals  OT Goals(current goals can now be found in the care plan section)  Progress towards OT goals: Progressing toward goals  Acute Rehab OT Goals Patient Stated Goal: get home today OT Goal Formulation: With patient/family Time For Goal Achievement: 12/17/22 Potential to Achieve Goals: [place] Discharge plan remains appropriate    Co-evaluation                 AM-PAC OT "6 Clicks" Daily Activity     Outcome Measure   Help from another person eating meals?: A Little Help from another person taking care of personal grooming?: A Little Help from another person toileting, which includes using toliet, bedpan, or urinal?: A Lot Help from another person bathing (including washing, rinsing, drying)?: A Lot Help from another person to put on and taking off regular upper body clothing?: A Little Help from another person to put on and taking off regular lower body clothing?: A Lot 6 Click Score: 15    End of Session Equipment Utilized During Treatment: Rolling walker (2 wheels);Gait belt  OT Visit Diagnosis: Unsteadiness on feet (R26.81);Muscle weakness (generalized) (M62.81)   Activity Tolerance Patient tolerated treatment well   Patient Left with call bell/phone  within reach;with chair alarm set;in bed;with bed alarm set;with family/visitor present   Nurse Communication Mobility status (HR)        Time: 0630-1601 OT Time Calculation (min): 13 min  Charges: OT General Charges $OT Visit: 1 Visit OT Treatments $Self Care/Home Management : 8-22 mins Jesse Sans OTR/L Acute Rehabilitation Services Office: [place] 12/08/2022, 1:12 PM

## 2022-12-08 NOTE — Progress Notes (Signed)
   Rounding Note    Patient Name: Jillian [name] Date of Encounter: 12/08/2022  [place] Cardiologist: Dorris Carnes, MD   Telemetry reviewed.  Patient's heart rate remains high normal to mildly elevated.  This should improve as she recovers from influenza and as amiodarone is further loaded.  Would continue amiodarone as outlined in prior notes as well as metoprolol 50 mg twice daily.  Patient can be discharged from a cardiac standpoint.  It is noted she is discharged to hospice care.  For questions or updates, please contact [place] Please consult [email or web address] for contact info under        Signed, Kirk Ruths, MD  12/08/2022, 7:37 AM

## 2022-12-08 NOTE — Progress Notes (Signed)
SLP Cancellation Note  Patient Details Name: Jillian [name] MRN: [id number] DOB: Jul 07, 1936   Cancelled treatment:       Reason Eval/Treat Not Completed: Other (comment) (patient discharging home with Hospice today, SLP to s/o at this time)   [name], MA, CCC-SLP Speech Therapy

## 2022-12-08 NOTE — TOC Transition Note (Signed)
Transition of Care (TOC) - CM/SW Discharge Note [name] RN,BSN Transitions of Care Unit 4NP (Non Trauma)- RN Case Manager See Treatment Team for direct Phone #   Patient Details  Name: Jillian [name] MRN: [id number] Date of Birth: 03-29-1936  Transition of Care [facility]) CM/SW Contact:  Dawayne Patricia, RN Phone Number: 12/08/2022, 11:53 AM   Clinical Narrative:    Pt stable for transition home today, Hospice set up with [facility] and they will plan to make initial visit today after patient is home for Hospice admission.  TOC pharmacy has filled meds and family has them.   DME confirmed yesterday as delivered to the home per [facility].   [facility] called for transport home, paperwork placed on shadow chart as well as GOLD DNRs.   1145- received msg from bedside RN that address for transport needs correcting- pt actually going to  [address] [place] [address]- paper corrected and re-printed for [facility] transport.  [facility] updated as well.   Final next level of care: Home w Hospice Care Barriers to Discharge: Barriers Resolved   Patient Goals and CMS Choice    Hospice   Discharge Placement              Home w/ Hospice           Discharge Plan and Services Additional resources added to the After Visit Summary for   In-house Referral: Clinical Social Work Discharge Planning Services: CM Consult Post Acute Care Choice: Hospice          DME Arranged: Hospice Equipment Package Others DME Agency: [employer]     Representative spoke with at DME Agency: per [place]: Hospice and [place] Date [place]: 12/06/22 Time [place]: 4765 Representative spoke with at [place]: Nicholaus [place]  Social Determinants of Health (SDOH) Interventions SDOH Screenings   Depression (PHQ2-9): Low Risk  (11/11/2022)  Tobacco Use: Low Risk  (10/11/2022)     Readmission Risk Interventions    12/08/2022    11:52 AM  Readmission Risk Prevention Plan  Transportation Screening Complete  PCP or Specialist Appt within 3-5 Days Not Complete  Not Complete comments home w/ hospice  HRI or [place] Complete  Social Work Consult for [place] Planning/Counseling Complete  Palliative Care Screening Complete  Medication Review Press photographer) Complete

## 2022-12-29 ENCOUNTER — Encounter (HOSPITAL_COMMUNITY): Payer: Self-pay | Admitting: Family Medicine

## 2022-12-29 ENCOUNTER — Other Ambulatory Visit: Payer: Self-pay

## 2022-12-29 ENCOUNTER — Inpatient Hospital Stay (HOSPITAL_COMMUNITY)
Admission: EM | Admit: 2022-12-29 | Discharge: 2022-12-30 | DRG: 193 | Disposition: A | Discharge status: Hospice | Attending: Internal Medicine | Admitting: Internal Medicine

## 2022-12-29 ENCOUNTER — Emergency Department (HOSPITAL_COMMUNITY)

## 2022-12-29 DIAGNOSIS — R509 Fever, unspecified: Secondary | ICD-10-CM | POA: Diagnosis not present

## 2022-12-29 DIAGNOSIS — Z515 Encounter for palliative care: Secondary | ICD-10-CM

## 2022-12-29 DIAGNOSIS — I10 Essential (primary) hypertension: Secondary | ICD-10-CM

## 2022-12-29 DIAGNOSIS — J9811 Atelectasis: Secondary | ICD-10-CM | POA: Diagnosis not present

## 2022-12-29 DIAGNOSIS — R195 Other fecal abnormalities: Secondary | ICD-10-CM

## 2022-12-29 DIAGNOSIS — J90 Pleural effusion, not elsewhere classified: Secondary | ICD-10-CM | POA: Diagnosis not present

## 2022-12-29 DIAGNOSIS — J189 Pneumonia, unspecified organism: Secondary | ICD-10-CM | POA: Diagnosis present

## 2022-12-29 DIAGNOSIS — Z91040 Latex allergy status: Secondary | ICD-10-CM

## 2022-12-29 DIAGNOSIS — Z7189 Other specified counseling: Secondary | ICD-10-CM

## 2022-12-29 DIAGNOSIS — Z66 Do not resuscitate: Secondary | ICD-10-CM | POA: Diagnosis present

## 2022-12-29 DIAGNOSIS — N390 Urinary tract infection, site not specified: Secondary | ICD-10-CM | POA: Diagnosis not present

## 2022-12-29 DIAGNOSIS — I4891 Unspecified atrial fibrillation: Principal | ICD-10-CM

## 2022-12-29 DIAGNOSIS — I5032 Chronic diastolic (congestive) heart failure: Secondary | ICD-10-CM | POA: Diagnosis present

## 2022-12-29 DIAGNOSIS — J9621 Acute and chronic respiratory failure with hypoxia: Secondary | ICD-10-CM | POA: Diagnosis present

## 2022-12-29 DIAGNOSIS — J9622 Acute and chronic respiratory failure with hypercapnia: Secondary | ICD-10-CM | POA: Diagnosis not present

## 2022-12-29 DIAGNOSIS — K922 Gastrointestinal hemorrhage, unspecified: Secondary | ICD-10-CM | POA: Diagnosis not present

## 2022-12-29 DIAGNOSIS — N179 Acute kidney failure, unspecified: Secondary | ICD-10-CM | POA: Diagnosis present

## 2022-12-29 DIAGNOSIS — I480 Paroxysmal atrial fibrillation: Secondary | ICD-10-CM | POA: Diagnosis present

## 2022-12-29 DIAGNOSIS — R627 Adult failure to thrive: Secondary | ICD-10-CM | POA: Diagnosis present

## 2022-12-29 DIAGNOSIS — I130 Hypertensive heart and chronic kidney disease with heart failure and stage 1 through stage 4 chronic kidney disease, or unspecified chronic kidney disease: Secondary | ICD-10-CM | POA: Diagnosis present

## 2022-12-29 DIAGNOSIS — Z1152 Encounter for screening for COVID-19: Secondary | ICD-10-CM

## 2022-12-29 DIAGNOSIS — E8770 Fluid overload, unspecified: Secondary | ICD-10-CM | POA: Diagnosis not present

## 2022-12-29 DIAGNOSIS — Z880 Allergy status to penicillin: Secondary | ICD-10-CM

## 2022-12-29 DIAGNOSIS — Z8249 Family history of ischemic heart disease and other diseases of the circulatory system: Secondary | ICD-10-CM

## 2022-12-29 DIAGNOSIS — D509 Iron deficiency anemia, unspecified: Secondary | ICD-10-CM | POA: Diagnosis not present

## 2022-12-29 DIAGNOSIS — Z8673 Personal history of transient ischemic attack (TIA), and cerebral infarction without residual deficits: Secondary | ICD-10-CM

## 2022-12-29 DIAGNOSIS — R54 Age-related physical debility: Secondary | ICD-10-CM | POA: Diagnosis not present

## 2022-12-29 DIAGNOSIS — D62 Acute posthemorrhagic anemia: Secondary | ICD-10-CM | POA: Diagnosis present

## 2022-12-29 DIAGNOSIS — Z803 Family history of malignant neoplasm of breast: Secondary | ICD-10-CM

## 2022-12-29 DIAGNOSIS — R609 Edema, unspecified: Secondary | ICD-10-CM | POA: Diagnosis not present

## 2022-12-29 DIAGNOSIS — G9341 Metabolic encephalopathy: Secondary | ICD-10-CM | POA: Diagnosis present

## 2022-12-29 DIAGNOSIS — I499 Cardiac arrhythmia, unspecified: Secondary | ICD-10-CM | POA: Diagnosis not present

## 2022-12-29 DIAGNOSIS — F419 Anxiety disorder, unspecified: Secondary | ICD-10-CM | POA: Diagnosis present

## 2022-12-29 DIAGNOSIS — K7689 Other specified diseases of liver: Secondary | ICD-10-CM | POA: Diagnosis not present

## 2022-12-29 DIAGNOSIS — D649 Anemia, unspecified: Secondary | ICD-10-CM

## 2022-12-29 DIAGNOSIS — Z6835 Body mass index (BMI) 35.0-35.9, adult: Secondary | ICD-10-CM | POA: Diagnosis not present

## 2022-12-29 DIAGNOSIS — N184 Chronic kidney disease, stage 4 (severe): Secondary | ICD-10-CM | POA: Diagnosis not present

## 2022-12-29 DIAGNOSIS — Z743 Need for continuous supervision: Secondary | ICD-10-CM | POA: Diagnosis not present

## 2022-12-29 DIAGNOSIS — G9340 Encephalopathy, unspecified: Secondary | ICD-10-CM | POA: Diagnosis not present

## 2022-12-29 DIAGNOSIS — Z91048 Other nonmedicinal substance allergy status: Secondary | ICD-10-CM

## 2022-12-29 DIAGNOSIS — R0602 Shortness of breath: Secondary | ICD-10-CM | POA: Diagnosis not present

## 2022-12-29 DIAGNOSIS — Z886 Allergy status to analgesic agent status: Secondary | ICD-10-CM

## 2022-12-29 DIAGNOSIS — Z888 Allergy status to other drugs, medicaments and biological substances status: Secondary | ICD-10-CM

## 2022-12-29 DIAGNOSIS — K769 Liver disease, unspecified: Secondary | ICD-10-CM | POA: Diagnosis not present

## 2022-12-29 DIAGNOSIS — R0603 Acute respiratory distress: Secondary | ICD-10-CM | POA: Diagnosis not present

## 2022-12-29 LAB — I-STAT ARTERIAL BLOOD GAS, ED
Acid-Base Excess: 2 mmol/L (ref 0.0–2.0)
Bicarbonate: 27.9 mmol/L (ref 20.0–28.0)
Calcium, Ion: 1.2 mmol/L (ref 1.15–1.40)
HCT: 21 % — ABNORMAL LOW (ref 36.0–46.0)
Hemoglobin: 7.1 g/dL — ABNORMAL LOW (ref 12.0–15.0)
O2 Saturation: 84 %
Patient temperature: 99.5
Potassium: 4.4 mmol/L (ref 3.5–5.1)
Sodium: 140 mmol/L (ref 135–145)
TCO2: 29 mmol/L (ref 22–32)
pCO2 arterial: 53.1 mmHg — ABNORMAL HIGH (ref 32–48)
pH, Arterial: 7.33 — ABNORMAL LOW (ref 7.35–7.45)
pO2, Arterial: 55 mmHg — ABNORMAL LOW (ref 83–108)

## 2022-12-29 LAB — CBC WITH DIFFERENTIAL/PLATELET
Abs Immature Granulocytes: 0.06 10*3/uL (ref 0.00–0.07)
Basophils Absolute: 0 10*3/uL (ref 0.0–0.1)
Basophils Relative: 0 %
Eosinophils Absolute: 0 10*3/uL (ref 0.0–0.5)
Eosinophils Relative: 0 %
HCT: 23.5 % — ABNORMAL LOW (ref 36.0–46.0)
Hemoglobin: 7.3 g/dL — ABNORMAL LOW (ref 12.0–15.0)
Immature Granulocytes: 1 %
Lymphocytes Relative: 14 %
Lymphs Abs: 0.9 10*3/uL (ref 0.7–4.0)
MCH: 27.7 pg (ref 26.0–34.0)
MCHC: 31.1 g/dL (ref 30.0–36.0)
MCV: 89 fL (ref 80.0–100.0)
Monocytes Absolute: 0.8 10*3/uL (ref 0.1–1.0)
Monocytes Relative: 13 %
Neutro Abs: 4.6 10*3/uL (ref 1.7–7.7)
Neutrophils Relative %: 72 %
Platelets: 226 10*3/uL (ref 150–400)
RBC: 2.64 MIL/uL — ABNORMAL LOW (ref 3.87–5.11)
RDW: 16.9 % — ABNORMAL HIGH (ref 11.5–15.5)
WBC: 6.5 10*3/uL (ref 4.0–10.5)
nRBC: 0 % (ref 0.0–0.2)

## 2022-12-29 LAB — LACTIC ACID, PLASMA: Lactic Acid, Venous: 1.1 mmol/L (ref 0.5–1.9)

## 2022-12-29 LAB — COMPREHENSIVE METABOLIC PANEL
ALT: 15 U/L (ref 0–44)
AST: 18 U/L (ref 15–41)
Albumin: 2.7 g/dL — ABNORMAL LOW (ref 3.5–5.0)
Alkaline Phosphatase: 124 U/L (ref 38–126)
Anion gap: 11 (ref 5–15)
BUN: 47 mg/dL — ABNORMAL HIGH (ref 8–23)
CO2: 25 mmol/L (ref 22–32)
Calcium: 8.6 mg/dL — ABNORMAL LOW (ref 8.9–10.3)
Chloride: 101 mmol/L (ref 98–111)
Creatinine, Ser: 3.63 mg/dL — ABNORMAL HIGH (ref 0.44–1.00)
GFR, Estimated: 12 mL/min — ABNORMAL LOW (ref >60)
Glucose, Bld: 104 mg/dL — ABNORMAL HIGH (ref 70–99)
Potassium: 4.4 mmol/L (ref 3.5–5.1)
Sodium: 137 mmol/L (ref 135–145)
Total Bilirubin: 0.8 mg/dL (ref 0.3–1.2)
Total Protein: 6.8 g/dL (ref 6.5–8.1)

## 2022-12-29 LAB — URINALYSIS, ROUTINE W REFLEX MICROSCOPIC
Bilirubin Urine: NEGATIVE
Glucose, UA: NEGATIVE mg/dL
Ketones, ur: NEGATIVE mg/dL
Nitrite: NEGATIVE
Protein, ur: 100 mg/dL — AB
RBC / HPF: >50 RBC/hpf (ref 0–5)
Specific Gravity, Urine: 1.012 (ref 1.005–1.030)
WBC, UA: >50 WBC/hpf (ref 0–5)
pH: 5 (ref 5.0–8.0)

## 2022-12-29 LAB — I-STAT VENOUS BLOOD GAS, ED
Acid-Base Excess: 3 mmol/L — ABNORMAL HIGH (ref 0.0–2.0)
Bicarbonate: 27.2 mmol/L (ref 20.0–28.0)
Calcium, Ion: 1.11 mmol/L — ABNORMAL LOW (ref 1.15–1.40)
HCT: 24 % — ABNORMAL LOW (ref 36.0–46.0)
Hemoglobin: 8.2 g/dL — ABNORMAL LOW (ref 12.0–15.0)
O2 Saturation: 96 %
Potassium: 4.4 mmol/L (ref 3.5–5.1)
Sodium: 140 mmol/L (ref 135–145)
TCO2: 28 mmol/L (ref 22–32)
pCO2, Ven: 39.7 mmHg — ABNORMAL LOW (ref 44–60)
pH, Ven: 7.443 — ABNORMAL HIGH (ref 7.25–7.43)
pO2, Ven: 79 mmHg — ABNORMAL HIGH (ref 32–45)

## 2022-12-29 LAB — PREPARE RBC (CROSSMATCH)

## 2022-12-29 LAB — POC OCCULT BLOOD, ED: Fecal Occult Bld: POSITIVE — AB

## 2022-12-29 LAB — RESP PANEL BY RT-PCR (RSV, FLU A&B, COVID)  RVPGX2
Influenza A by PCR: NEGATIVE
Influenza B by PCR: NEGATIVE
Resp Syncytial Virus by PCR: NEGATIVE
SARS Coronavirus 2 by RT PCR: NEGATIVE

## 2022-12-29 LAB — STREP PNEUMONIAE URINARY ANTIGEN: Strep Pneumo Urinary Antigen: NEGATIVE

## 2022-12-29 LAB — PROCALCITONIN: Procalcitonin: 0.72 ng/mL

## 2022-12-29 LAB — MAGNESIUM: Magnesium: 2 mg/dL (ref 1.7–2.4)

## 2022-12-29 LAB — HEMOGLOBIN AND HEMATOCRIT, BLOOD
HCT: 22.9 % — ABNORMAL LOW (ref 36.0–46.0)
Hemoglobin: 7.1 g/dL — ABNORMAL LOW (ref 12.0–15.0)

## 2022-12-29 MED ORDER — ONDANSETRON HCL 4 MG PO TABS: 4.0000 mg | ORAL_TABLET | Freq: Four times a day (QID) | ORAL | Status: DC | PRN

## 2022-12-29 MED ORDER — SODIUM CHLORIDE 0.9 % IV SOLN: 2.0000 g | Freq: Once | INTRAVENOUS | Status: DC

## 2022-12-29 MED ORDER — METOPROLOL TARTRATE 50 MG PO TABS
50.0000 mg | ORAL_TABLET | Freq: Two times a day (BID) | ORAL | Status: DC
  Administered 2022-12-29 – 2022-12-30 (×3): 50 mg via ORAL
  Filled 2022-12-29: qty 2
  Filled 2022-12-29 (×2): qty 1

## 2022-12-29 MED ORDER — SENNOSIDES-DOCUSATE SODIUM 8.6-50 MG PO TABS: 1.0000 | ORAL_TABLET | Freq: Every evening | ORAL | Status: DC | PRN

## 2022-12-29 MED ORDER — AMIODARONE HCL 200 MG PO TABS
200.0000 mg | ORAL_TABLET | Freq: Every day | ORAL | Status: DC
  Administered 2022-12-29 – 2022-12-30 (×2): 200 mg via ORAL
  Filled 2022-12-29 (×2): qty 1

## 2022-12-29 MED ORDER — SODIUM CHLORIDE 0.9% IV SOLUTION: Freq: Once | INTRAVENOUS | Status: AC

## 2022-12-29 MED ORDER — LEVALBUTEROL HCL 1.25 MG/0.5ML IN NEBU
1.2500 mg | INHALATION_SOLUTION | Freq: Four times a day (QID) | RESPIRATORY_TRACT | Status: DC | PRN
  Administered 2022-12-29 – 2022-12-30 (×2): 1.25 mg via RESPIRATORY_TRACT
  Filled 2022-12-29 (×2): qty 0.5

## 2022-12-29 MED ORDER — HALOPERIDOL LACTATE 5 MG/ML IJ SOLN: 3.0000 mg | Freq: Four times a day (QID) | INTRAMUSCULAR | Status: DC | PRN

## 2022-12-29 MED ORDER — HYDRALAZINE HCL 50 MG PO TABS
50.0000 mg | ORAL_TABLET | Freq: Three times a day (TID) | ORAL | Status: DC
  Administered 2022-12-29 – 2022-12-30 (×3): 50 mg via ORAL
  Filled 2022-12-29 (×2): qty 1
  Filled 2022-12-29: qty 2

## 2022-12-29 MED ORDER — VANCOMYCIN HCL IN DEXTROSE 1-5 GM/200ML-% IV SOLN
1000.0000 mg | Freq: Once | INTRAVENOUS | Status: AC
  Administered 2022-12-29: 1000 mg via INTRAVENOUS
  Filled 2022-12-29: qty 200

## 2022-12-29 MED ORDER — ACETAMINOPHEN 325 MG PO TABS
650.0000 mg | ORAL_TABLET | Freq: Four times a day (QID) | ORAL | Status: DC | PRN
  Administered 2022-12-29: 650 mg via ORAL
  Filled 2022-12-29: qty 2

## 2022-12-29 MED ORDER — ONDANSETRON HCL 4 MG/2ML IJ SOLN: 4.0000 mg | Freq: Four times a day (QID) | INTRAMUSCULAR | Status: DC | PRN

## 2022-12-29 MED ORDER — SODIUM CHLORIDE 0.9% FLUSH
3.0000 mL | Freq: Two times a day (BID) | INTRAVENOUS | Status: DC
  Administered 2022-12-29 – 2022-12-30 (×3): 3 mL via INTRAVENOUS

## 2022-12-29 MED ORDER — GUAIFENESIN-DM 100-10 MG/5ML PO SYRP
5.0000 mL | ORAL_SOLUTION | ORAL | Status: DC | PRN
  Administered 2022-12-29: 5 mL via ORAL
  Filled 2022-12-29: qty 5

## 2022-12-29 MED ORDER — ACETAMINOPHEN 650 MG RE SUPP: 650.0000 mg | Freq: Four times a day (QID) | RECTAL | Status: DC | PRN

## 2022-12-29 MED ORDER — SODIUM CHLORIDE 0.9 % IV SOLN
2.0000 g | Freq: Every day | INTRAVENOUS | Status: DC
  Administered 2022-12-29 – 2022-12-30 (×2): 2 g via INTRAVENOUS
  Filled 2022-12-29 (×2): qty 20

## 2022-12-29 MED ORDER — ACETAMINOPHEN 325 MG PO TABS: 650.0000 mg | ORAL_TABLET | Freq: Four times a day (QID) | ORAL | Status: DC | PRN

## 2022-12-29 MED ORDER — AMLODIPINE BESYLATE 5 MG PO TABS
5.0000 mg | ORAL_TABLET | Freq: Every day | ORAL | Status: DC
  Administered 2022-12-29: 5 mg via ORAL
  Filled 2022-12-29: qty 1

## 2022-12-29 MED ORDER — SODIUM CHLORIDE 0.9 % IV SOLN
500.0000 mg | Freq: Every day | INTRAVENOUS | Status: DC
  Administered 2022-12-29: 500 mg via INTRAVENOUS
  Filled 2022-12-29: qty 5

## 2022-12-29 NOTE — Consult Note (Signed)
Consultation Note Date: 12/29/2022   Patient Name: Jillian [name]  DOB: 05-22-1936  MRN: [id number]  Age / Sex: 87 y.o., female  PCP: [name], MD Referring Physician: Barb Merino, MD  Reason for Consultation: Establishing goals of care  HPI/Patient Profile: 87 y.o. female  with past medical history of CKD stage IV, chronic diastolic heart failure, history of stroke, hypertension, paroxysmal A-fib on Eliquis admitted on 12/29/2022 with altered mental status and cough.   Patient was recently admitted to the hospital and discharged on 1/23 with home hospice program due to severe untreatable medical conditions.  She is now admitted for multifocal pneumonia, acute on chronic respiratory failure, anemia due to GI bleeding, A-fib with RVR, and AKI on CKD 4. PMT has been consulted to assist with goals of care conversation.  Clinical Assessment and Goals of Care:  I have reviewed medical records including EPIC notes, labs and imaging, assessed the patient and then had a phone conversation with patient's daughter Jillian [name] to discuss diagnosis prognosis, [place], EOL wishes, disposition and options.  I introduced Palliative Medicine as specialized medical care for people living with serious illness. It focuses on providing relief from the symptoms and stress of a serious illness. The goal is to improve quality of life for both the patient and the family.  We discussed a brief life review of the patient and then focused on their current illness.   I attempted to elicit values and goals of care important to the patient.    Medical History Review and Understanding:  Provided detailed review of patient's acute illnesses and also discussed her overall health since last admission.  Social History: Patient is from home where she lives with her daughter Jillian [name].  She is a Engineer, manufacturing.  Functional and Nutritional  State: Patient with ongoing decline and recently enrolled in hospice.  Albumin of 2.7 noted on 2/14.  Palliative Symptoms: Dyspnea, cough  Code Status: Concepts specific to code status, artifical feeding and hydration, and rehospitalization were considered and discussed.  DNR confirmed.  Discussion: Initially met with the patient who is feeling lousy and tells me her goal is "get me well."  She is not feeling very talkative but does tell me "I want to live."  Reviewed acute illnesses with her as well as CT imaging that shows liver lesions, exploring her thoughts on further workup.  She states it would be "too much" to do more diagnostics on her liver and she would rather focus on other things such as her pneumonia, A-fib, anemia, and AKI.  We then discussed her experience with hospice and she states she would like to resume hospice services when she returns home.  It is unclear how much she understands hospice philosophy, as she states she both wants to live "forever" and "in God's time." I then called patient's daughter Jillian [name] to provide update of the above conversations, also reviewed patient's acute illnesses and severity of her condition.  She initially asks when more liver imaging will be obtained, but after hearing that her mother is not interested in this and discussing that patient would not be a candidate for chemo or radiation even if confirmed, she understands it is best to avoid further workup.  She wonders if hospice would be able to see the patient more often after discharge or if she is now a candidate for residential hospice. Counseled that she is likely a candidate and offered to assist with coordination of eligibility review.  Also offered a family meeting to help  her provide updates to the rest of family members and she is appreciative.  She will call me back with a preferred date/time.  Discussed the importance of continued conversation with family and the medical providers  regarding overall plan of care and treatment options, ensuring decisions are within the context of the patient's values and GOCs.   Questions and concerns were addressed.  Hard Choices booklet was offered, patient declined. The family was encouraged to call with questions or concerns.  PMT will continue to support holistically.  SUMMARY OF RECOMMENDATIONS   -Continue DNR -Continue current interventions, patient and her daughter would like treatment of acute illnesses prior to discharge with hospice -Patient's daughter is interested in residential hospice placement, consulted TOC and discussed with [facility] hospice liaison Nicholaus Corolla, RN via secure chat -Patient's daughter will call PMT with preferred date/time of family meeting for ongoing goals of care discussions -Psychosocial and emotional support provided -PMT will continue to follow and support  Prognosis:  < 2 weeks  Discharge Planning: To Be Determined      Primary Diagnoses: Present on Admission:  Multifocal pneumonia  Occult GI bleeding  Paroxysmal atrial fibrillation with RVR (HCC)  Chronic diastolic heart failure (HCC)  Benign essential hypertension  Acute on chronic respiratory failure with hypoxia and hypercapnia (HCC)  Acute renal failure superimposed on stage 4 chronic kidney disease ([place])  Acute encephalopathy  Liver lesion  Physical Exam Vitals and nursing note reviewed.  Constitutional:      General: She is not in acute distress.    Appearance: She is ill-appearing.  Cardiovascular:     Rate and Rhythm: Tachycardia present. Rhythm irregular.  Pulmonary:     Effort: Pulmonary effort is normal. Tachypnea present.  Skin:    General: Skin is warm and dry.  Neurological:     Mental Status: She is alert.    Vital Signs: BP 113/81   Pulse (!) 110   Temp 98 F (36.7 C) (Oral)   Resp (!) 24   SpO2 100%  Pain Scale: 0-10   Pain Score: 0-No pain   SpO2: SpO2: 100 % O2 Device:SpO2: 100 % O2 Flow Rate: .O2  Flow Rate (L/min): 4 L/min   Palliative Assessment/Data: 30% at best     MDM: High   Derriana Oser Johnnette Litter, PA-C  Palliative Medicine Team Team phone # [phone]  Thank you for allowing the Palliative Medicine Team to assist in the care of this patient. Please utilize secure chat with additional questions, if there is no response within 30 minutes please call the above phone number.  Palliative Medicine Team providers are available by phone from 7am to 7pm daily and can be reached through the team cell phone.  Should this patient require assistance outside of these hours, please call the patient's attending physician.

## 2022-12-29 NOTE — ED Notes (Signed)
ED TO INPATIENT HANDOFF REPORT  ED Nurse Name and Phone #: Massie Maroon RN [phone]  S Name/Age/Gender Jillian [name] 87 y.o. female Room/Bed: 010C/010C  Code Status   Code Status: DNR  Home/SNF/Other Home Patient oriented to: self and place Is this baseline? Yes   Triage Complete: Triage complete  Chief Complaint Multifocal pneumonia [J18.9]  Triage Note Pt BIB [facility] from home after family stated pt has been altered x3 days. Per family, pt was combative and administered 5 of haldol and 5 of morphine. Pt is hospice pt. Pt typically AO4. Pt currently on antibiotics for UTI. Foley cath in place, red tinted urine noted. Tachy afib, other VS stable   Allergies Allergies  Allergen Reactions   Other Other (See Comments)    "Tapes tears off my skin"   Latex Other (See Comments)    "Blisters my skin and tears it off"- skin burns, too   Penicillins Nausea Only   Tape Other (See Comments)    "Tapes tears off my skin"   Aspirin Other (See Comments)    Irritates the stomach   Atorvastatin Other (See Comments)    Caused fatigue   Diltiazem Hcl Nausea And Vomiting    Level of Care/Admitting Diagnosis ED Disposition     ED Disposition  Admit   Condition  --   [place]: [facility] [[id number]]  Level of Care: Progressive [102]  Admit to Progressive based on following criteria: MULTISYSTEM THREATS such as stable sepsis, metabolic/electrolyte imbalance with or without encephalopathy that is responding to early treatment.  May [facility] patient to [facility] or [facility] if equivalent level of care is available:: Yes  Covid Evaluation: Confirmed COVID Negative  Diagnosis: Multifocal pneumonia [id number]  Admitting Physician: Vianne Bulls [id number]  Attending Physician: Vianne Bulls [id number]  Certification:: I certify this patient will need inpatient services for at least 2 midnights  Estimated Length of Stay: 3          B Medical/Surgery  History Past Medical History:  Diagnosis Date   Acute CVA (cerebrovascular accident) ([place]) 07/12/2020   Hypertension    Renal disorder    CKD Stage IV   Stroke [facility])    Past Surgical History:  Procedure Laterality Date   CARDIAC CATHETERIZATION  2009     A IV Location/Drains/Wounds Patient Lines/Drains/Airways Status     Active Line/Drains/Airways     Name Placement date Placement time Site Days   Peripheral IV 12/29/22 20 G 1.88" Right;Anterior Forearm 12/29/22  0154  Forearm  less than 1   Peripheral IV 12/29/22 20 G 1.88" Right;Upper Arm 12/29/22  0455  Arm  less than 1   Wound / Incision (Open or Dehisced) 12/04/22 Skin tear Arm Anterior;Lower;Proximal;Right skin tear 12/04/22  0430  Arm  25            Intake/Output Last 24 hours  Intake/Output Summary (Last 24 hours) at 12/29/2022 1342 Last data filed at 12/29/2022 0400 Gross per 24 hour  Intake --  Output 200 ml  Net -200 ml    Labs/Imaging Results for orders placed or performed during the hospital encounter of 12/29/22 (from the past 48 hour(s))  CBC with Differential     Status: Abnormal   Collection Time: 12/29/22  1:06 AM  Result Value Ref Range   WBC 6.5 4.0 - 10.5 K/uL   RBC 2.64 (L) 3.87 - 5.11 MIL/uL   Hemoglobin 7.3 (L) 12.0 - 15.0 g/dL   HCT  23.5 (L) 36.0 - 46.0 %   MCV 89.0 80.0 - 100.0 fL   MCH 27.7 26.0 - 34.0 pg   MCHC 31.1 30.0 - 36.0 g/dL   RDW 16.9 (H) 11.5 - 15.5 %   Platelets 226 150 - 400 K/uL   nRBC 0.0 0.0 - 0.2 %   Neutrophils Relative % 72 %   Neutro Abs 4.6 1.7 - 7.7 K/uL   Lymphocytes Relative 14 %   Lymphs Abs 0.9 0.7 - 4.0 K/uL   Monocytes Relative 13 %   Monocytes Absolute 0.8 0.1 - 1.0 K/uL   Eosinophils Relative 0 %   Eosinophils Absolute 0.0 0.0 - 0.5 K/uL   Basophils Relative 0 %   Basophils Absolute 0.0 0.0 - 0.1 K/uL   Immature Granulocytes 1 %   Abs Immature Granulocytes 0.06 0.00 - 0.07 K/uL    Comment: Performed at [facility] [address]  [place], [place] [address]  Comprehensive metabolic panel     Status: Abnormal   Collection Time: 12/29/22  1:06 AM  Result Value Ref Range   Sodium 137 135 - 145 mmol/L   Potassium 4.4 3.5 - 5.1 mmol/L   Chloride 101 98 - 111 mmol/L   CO2 25 22 - 32 mmol/L   Glucose, Bld 104 (H) 70 - 99 mg/dL    Comment: Glucose reference range applies only to samples taken after fasting for at least 8 hours.   BUN 47 (H) 8 - 23 mg/dL   Creatinine, Ser 3.63 (H) 0.44 - 1.00 mg/dL   Calcium 8.6 (L) 8.9 - 10.3 mg/dL   Total Protein 6.8 6.5 - 8.1 g/dL   Albumin 2.7 (L) 3.5 - 5.0 g/dL   AST 18 15 - 41 U/L   ALT 15 0 - 44 U/L   Alkaline Phosphatase 124 38 - 126 U/L   Total Bilirubin 0.8 0.3 - 1.2 mg/dL   GFR, Estimated 12 (L) >60 mL/min    Comment: (NOTE) Calculated using the CKD-EPI Creatinine Equation (2021)    Anion gap 11 5 - 15    Comment: Performed at [facility], [place] [address], [place], [place] [address]  Magnesium     Status: None   Collection Time: 12/29/22  1:06 AM  Result Value Ref Range   Magnesium 2.0 1.7 - 2.4 mg/dL    Comment: Performed at [facility] [address], [place], [place] [address]  Lactic acid, plasma     Status: None   Collection Time: 12/29/22  1:06 AM  Result Value Ref Range   Lactic Acid, Venous 1.1 0.5 - 1.9 mmol/L    Comment: Performed at [facility] [address] [place], [place] [address]  ABO/Rh     Status: None   Collection Time: 12/29/22  1:06 AM  Result Value Ref Range   ABO/RH(D)      B POS Performed at [facility] [address], [place], [place] [address]   Procalcitonin - Baseline     Status: None   Collection Time: 12/29/22  1:06 AM  Result Value Ref Range   Procalcitonin 0.72 ng/mL    Comment:        Interpretation: PCT > 0.5 ng/mL and <= 2 ng/mL: Systemic infection (sepsis) is possible, but other conditions are known to elevate PCT as well. (NOTE)       Sepsis PCT Algorithm           Lower  Respiratory Tract                                       Infection PCT Algorithm    ----------------------------     ----------------------------         PCT < 0.25 ng/mL                PCT < 0.10 ng/mL          Strongly encourage             Strongly discourage   discontinuation of antibiotics    initiation of antibiotics    ----------------------------     -----------------------------       PCT 0.25 - 0.50 ng/mL            PCT 0.10 - 0.25 ng/mL               OR       >80% decrease in PCT            Discourage initiation of                                            antibiotics      Encourage discontinuation           of antibiotics    ----------------------------     -----------------------------         PCT >= 0.50 ng/mL              PCT 0.26 - 0.50 ng/mL                AND       <80% decrease in PCT             Encourage initiation of                                             antibiotics       Encourage continuation           of antibiotics    ----------------------------     -----------------------------        PCT >= 0.50 ng/mL                  PCT > 0.50 ng/mL               AND         increase in PCT                  Strongly encourage                                      initiation of antibiotics    Strongly encourage escalation           of antibiotics                                     -----------------------------  PCT <= 0.25 ng/mL                                                 OR                                        > 80% decrease in PCT                                      Discontinue / Do not initiate                                             antibiotics  Performed at [facility], [place] [address], [place], [place] [address]   Urinalysis, Routine w reflex microscopic -Urine, Catheterized     Status: Abnormal   Collection Time: 12/29/22  1:23 AM  Result Value Ref Range   Color, Urine YELLOW YELLOW   APPearance HAZY (A) CLEAR    Specific Gravity, Urine 1.012 1.005 - 1.030   pH 5.0 5.0 - 8.0   Glucose, UA NEGATIVE NEGATIVE mg/dL   Hgb urine dipstick LARGE (A) NEGATIVE   Bilirubin Urine NEGATIVE NEGATIVE   Ketones, ur NEGATIVE NEGATIVE mg/dL   Protein, ur 100 (A) NEGATIVE mg/dL   Nitrite NEGATIVE NEGATIVE   Leukocytes,Ua LARGE (A) NEGATIVE   RBC / HPF >50 0 - 5 RBC/hpf   WBC, UA >50 0 - 5 WBC/hpf   Bacteria, UA FEW (A) NONE SEEN   Squamous Epithelial / HPF 0-5 0 - 5 /HPF   WBC Clumps PRESENT    Mucus PRESENT     Comment: Performed at [facility], [place] [address] [place], [place] [address]  I-Stat venous blood gas, ED     Status: Abnormal   Collection Time: 12/29/22  1:31 AM  Result Value Ref Range   pH, Ven 7.443 (H) 7.25 - 7.43   pCO2, Ven 39.7 (L) 44 - 60 mmHg   pO2, Ven 79 (H) 32 - 45 mmHg   Bicarbonate 27.2 20.0 - 28.0 mmol/L   TCO2 28 22 - 32 mmol/L   O2 Saturation 96 %   Acid-Base Excess 3.0 (H) 0.0 - 2.0 mmol/L   Sodium 140 135 - 145 mmol/L   Potassium 4.4 3.5 - 5.1 mmol/L   Calcium, Ion 1.11 (L) 1.15 - 1.40 mmol/L   HCT 24.0 (L) 36.0 - 46.0 %   Hemoglobin 8.2 (L) 12.0 - 15.0 g/dL   Sample type VENOUS   I-Stat arterial blood gas, ED ([facility], [facility], [facility])     Status: Abnormal   Collection Time: 12/29/22  2:27 AM  Result Value Ref Range   pH, Arterial 7.330 (L) 7.35 - 7.45   pCO2 arterial 53.1 (H) 32 - 48 mmHg   pO2, Arterial 55 (L) 83 - 108 mmHg   Bicarbonate 27.9 20.0 - 28.0 mmol/L   TCO2 29 22 - 32 mmol/L   O2 Saturation 84 %   Acid-Base Excess 2.0 0.0 - 2.0 mmol/L   Sodium 140 135 - 145  mmol/L   Potassium 4.4 3.5 - 5.1 mmol/L   Calcium, Ion 1.20 1.15 - 1.40 mmol/L   HCT 21.0 (L) 36.0 - 46.0 %   Hemoglobin 7.1 (L) 12.0 - 15.0 g/dL   Patient temperature 99.5 F    Collection site RADIAL, ALLEN'S TEST ACCEPTABLE    Drawn by HIDE    Sample type ARTERIAL   Resp panel by RT-PCR (RSV, Flu A&B, Covid) Anterior Nasal Swab     Status: None   Collection Time: 12/29/22  2:33 AM   Specimen:  Anterior Nasal Swab  Result Value Ref Range   SARS Coronavirus 2 by RT PCR NEGATIVE NEGATIVE   Influenza A by PCR NEGATIVE NEGATIVE   Influenza B by PCR NEGATIVE NEGATIVE    Comment: (NOTE) The Xpert Xpress SARS-CoV-2/FLU/RSV plus assay is intended as an aid in the diagnosis of influenza from Nasopharyngeal swab specimens and should not be used as a sole basis for treatment. Nasal washings and aspirates are unacceptable for Xpert Xpress SARS-CoV-2/FLU/RSV testing.  Fact Sheet for Patients: EntrepreneurPulse.com.au  Fact Sheet for Healthcare Providers: IncredibleEmployment.be  This test is not yet approved or cleared by the [place] FDA and has been authorized for detection and/or diagnosis of SARS-CoV-2 by FDA under an Emergency Use Authorization (EUA). This EUA will remain in effect (meaning this test can be used) for the duration of the COVID-19 declaration under Section 564(b)(1) of the Act, 21 U.S.C. section 360bbb-3(b)(1), unless the authorization is terminated or revoked.     Resp Syncytial Virus by PCR NEGATIVE NEGATIVE    Comment: (NOTE) Fact Sheet for Patients: EntrepreneurPulse.com.au  Fact Sheet for Healthcare Providers: IncredibleEmployment.be  This test is not yet approved or cleared by the [place] FDA and has been authorized for detection and/or diagnosis of SARS-CoV-2 by FDA under an Emergency Use Authorization (EUA). This EUA will remain in effect (meaning this test can be used) for the duration of the COVID-19 declaration under Section 564(b)(1) of the Act, 21 U.S.C. section 360bbb-3(b)(1), unless the authorization is terminated or revoked.  Performed at [facility], [place] [address], [place], [place] [address]   Hemoglobin and hematocrit, blood     Status: Abnormal   Collection Time: 12/29/22  2:51 AM  Result Value Ref Range   Hemoglobin 7.1 (L) 12.0 - 15.0 g/dL    HCT 22.9 (L) 36.0 - 46.0 %    Comment: Performed at [facility], [place] [address] [place], [place] [address]  Type and screen [place]     Status: None (Preliminary result)   Collection Time: 12/29/22  2:59 AM  Result Value Ref Range   ABO/RH(D) B POS    Antibody Screen NEG    Sample Expiration 01/01/2023,2359    Unit Number [id number]    Blood Component Type RED CELLS,LR    Unit division 00    Status of Unit ISSUED    Transfusion Status OK TO TRANSFUSE    Crossmatch Result      Compatible Performed at [facility], [place] [address], [place],  [address]   POC occult blood, ED     Status: Abnormal   Collection Time: 12/29/22  3:07 AM  Result Value Ref Range   Fecal Occult Bld POSITIVE (A) NEGATIVE  Prepare RBC (crossmatch)     Status: None   Collection Time: 12/29/22  3:38 AM  Result Value Ref Range   Order Confirmation      ORDER PROCESSED BY  BLOOD BANK Performed at [facility], [place] [address] [place], [place] [address]    CT Chest Wo Contrast  Result Date: 12/29/2022 CLINICAL DATA:  Respiratory illness, nondiagnostic xray. Hospice patient. EXAM: CT CHEST WITHOUT CONTRAST TECHNIQUE: Multidetector CT imaging of the chest was performed following the standard protocol without IV contrast. RADIATION DOSE REDUCTION: This exam was performed according to the departmental dose-optimization program which includes automated exposure control, adjustment of the mA and/or kV according to patient size and/or use of iterative reconstruction technique. COMPARISON:  None Available. FINDINGS: Cardiovascular: Mild coronary artery calcification. Global cardiac size within normal limits. Trace pericardial effusion. Central pulmonary arteries are enlarged in keeping with changes of pulmonary arterial hypertension. Mild atherosclerotic calcification within the thoracic aorta. No aortic aneurysm. Mediastinum/Nodes: 2.2 cm left thyroid nodule. In the setting of  significant comorbidities or limited life expectancy, no follow-up recommended (ref: J Am Coll Radiol. 2015 Feb;12(2): 143-50).No pathologic thoracic adenopathy. Esophagus unremarkable. Lungs/Pleura: There is multifocal consolidation throughout the lungs, more severe within the upper lobes bilaterally most in keeping with multifocal infection in the acute setting. No pneumothorax. Small left pleural effusion with associated left basilar atelectasis. No central obstructing lesion. Upper Abdomen: Innumerable hypodense nodules are seen throughout the liver, not well characterized on this examination, but suspicious for metastatic disease. No acute abnormality. Musculoskeletal: No acute bone abnormality. No lytic or blastic bone lesion. IMPRESSION: 1. Multifocal consolidation throughout the lungs, more severe within the upper lobes bilaterally, most in keeping with multifocal infection in the acute setting. 2. Small left pleural effusion with associated left basilar atelectasis. 3. Mild coronary artery calcification. 4. Morphologic changes in keeping with pulmonary arterial hypertension. 5. Innumerable hypodense nodules throughout the liver, not well characterized on this examination, but suspicious for metastatic disease. Correlation with history of primary malignancy is recommended. If indicated, dedicated contrast enhanced CT examination of the abdomen and pelvis would be helpful for further evaluation. Aortic Atherosclerosis (ICD10-I70.0). Electronically Signed   By: Fidela Salisbury M.D.   On: 12/29/2022 03:22   DG Chest Portable 1 View  Result Date: 12/29/2022 CLINICAL DATA:  Altered mental status EXAM: PORTABLE CHEST 1 VIEW COMPARISON:  12/05/2022 FINDINGS: Shallow inspiration. Cardiac enlargement. Perihilar infiltration similar to prior study with probable small pleural effusions. Increasing infiltration in the right upper lung. Changes most likely to represent perihilar edema but developing superimposed  pneumonia could also be present. No pneumothorax. Mediastinal contours appear intact. Calcification of the aorta. IMPRESSION: Persistent cardiac enlargement with some perihilar infiltration or edema and small pleural effusions similar to prior study. Increasing airspace disease in the right upper lung may represent superimposed pneumonia. Electronically Signed   By: Lucienne Capers M.D.   On: 12/29/2022 01:28    Pending Labs Unresulted Labs (From admission, onward)     Start     Ordered   12/30/22 [id number]  Basic metabolic panel  Daily,   R      12/29/22 0435   12/30/22 0500  Magnesium  Tomorrow morning,   R        12/29/22 0435   12/30/22 0500  CBC  Daily,   R      12/29/22 0435   12/30/22 0500  Procalcitonin  Daily,   R      12/29/22 0435   12/29/22 0433  Legionella Pneumophila Serogp 1 Ur Ag  (COPD / Pneumonia / Cellulitis / Lower Extremity Wound)  Add-on,   AD        12/29/22 [id number]  12/29/22 0433  Strep pneumoniae urinary antigen  (COPD / Pneumonia / Cellulitis / Lower Extremity Wound)  Add-on,   AD        12/29/22 0435   12/29/22 0432  Urine Culture (for pregnant, neutropenic or urologic patients or patients with an indwelling urinary catheter)  (Urine Culture)  Add-on,   AD       Question:  Indication  Answer:  Altered mental status (if no other cause identified)   12/29/22 0435   12/29/22 0403  Blood culture (routine x 2)  BLOOD CULTURE X 2,   R (with STAT occurrences)      12/29/22 0402            Vitals/Pain Today's Vitals   12/29/22 1245 12/29/22 1300 12/29/22 1315 12/29/22 1330  BP: 101/69 97/65 103/65 112/63  Pulse: (!) 106 (!) 102 (!) 104 83  Resp: 15 16 15 15  $ Temp:      TempSrc:      SpO2: 99% 100% 99% 100%  PainSc:        Isolation Precautions No active isolations  Medications Medications  amiodarone (PACERONE) tablet 200 mg (200 mg Oral Given 12/29/22 1014)  amLODipine (NORVASC) tablet 5 mg (5 mg Oral Given 12/29/22 1015)  hydrALAZINE (APRESOLINE) tablet  50 mg (50 mg Oral Not Given 12/29/22 1325)  metoprolol tartrate (LOPRESSOR) tablet 50 mg (50 mg Oral Given 12/29/22 1015)  levalbuterol (XOPENEX) nebulizer solution 1.25 mg (has no administration in time range)  sodium chloride flush (NS) 0.9 % injection 3 mL (3 mLs Intravenous Given 12/29/22 0903)  acetaminophen (TYLENOL) tablet 650 mg (has no administration in time range)    Or  acetaminophen (TYLENOL) suppository 650 mg (has no administration in time range)  senna-docusate (Senokot-S) tablet 1 tablet (has no administration in time range)  ondansetron (ZOFRAN) tablet 4 mg (has no administration in time range)    Or  ondansetron (ZOFRAN) injection 4 mg (has no administration in time range)  haloperidol lactate (HALDOL) injection 3 mg (has no administration in time range)  cefTRIAXone (ROCEPHIN) 2 g in sodium chloride 0.9 % 100 mL IVPB (0 g Intravenous Stopped 12/29/22 0637)  azithromycin (ZITHROMAX) 500 mg in sodium chloride 0.9 % 250 mL IVPB (0 mg Intravenous Stopped 12/29/22 0736)  guaiFENesin-dextromethorphan (ROBITUSSIN DM) 100-10 MG/5ML syrup 5 mL (5 mLs Oral Given 12/29/22 0603)  0.9 %  sodium chloride infusion (Manually program via Guardrails IV Fluids) (0 mLs Intravenous Stopped 12/29/22 0727)  vancomycin (VANCOCIN) IVPB 1000 mg/200 mL premix (0 mg Intravenous Stopped 12/29/22 0607)    Mobility walks with device     Focused Assessments Cardiac Assessment Handoff:  Cardiac Rhythm: Atrial fibrillation No results found for: "CKTOTAL", "CKMB", "CKMBINDEX", "TROPONINI" No results found for: "DDIMER" Does the Patient currently have chest pain? No    R Recommendations: See Admitting Provider Note  Report given to:  3E15

## 2022-12-29 NOTE — ED Triage Notes (Signed)
Pt BIB [facility] from home after family stated pt has been altered x3 days. Per family, pt was combative and administered 5 of haldol and 5 of morphine. Pt is hospice pt. Pt typically AO4. Pt currently on antibiotics for UTI. Foley cath in place, red tinted urine noted. Tachy afib, other VS stable

## 2022-12-29 NOTE — H&P (Addendum)
History and Physical    Jillian [name] [id number] DOB: 1936-10-28 DOA: 12/29/2022  PCP: [name], MD   Patient coming from: Home   Chief Complaint: AMS, cough   HPI: Jillian [name] is a 87 y.o. female with medical history significant for hypertension, atrial fibrillation on Eliquis, CKD stage IV, history of CVA, and admission to the ICU last month, now presenting to the emergency department with altered mental status and cough.  Patient's family reported that the patient is had altered mental status for the past 3 days.  Patient complains of worsening productive cough.  She denies chest pain, abdominal pain, vomiting, melena, or hematochezia.  She reports bilateral lower extremity swelling but is unable to say whether it is any better or worse than usual.  Family called EMS for transport to the hospital, patient was said to be combative with EMS, and she was treated with Haldol and morphine prior to arrival in the ED.  ED Course: Upon arrival to the ED, patient is found to be afebrile and saturating well on 4 L/min of supplemental oxygen with tachypnea, tachycardia, and stable blood pressure.  EKG demonstrates atrial fibrillation with rate 135.  CT chest is notable for multifocal consolidations bilaterally most suggestive of infection, and innumerable nodules in the liver concerning for metastatic disease.  Labs are most notable for albumin 2.7, BUN 47, creatinine 3.63, hemoglobin 7.3, lactic acid 1.1, and positive fecal occult blood testing.  Blood cultures were collected in the ED and the patient was treated with vancomycin, cefepime, and 1 unit packed red blood cells.  Review of Systems:  All other systems reviewed and apart from HPI, are negative.  Past Medical History:  Diagnosis Date   Acute CVA (cerebrovascular accident) ([place]) 07/12/2020   Hypertension    Renal disorder    CKD Stage IV   Stroke [facility])     Past Surgical History:  Procedure Laterality Date   CARDIAC  CATHETERIZATION  2009    Social History:   reports that she has never smoked. She has never used smokeless tobacco. No history on file for alcohol use and drug use.  Allergies  Allergen Reactions   Other Other (See Comments)    "Tapes tears off my skin"   Latex Other (See Comments)    "Blisters my skin and tears it off"- skin burns, too   Penicillins Nausea Only   Tape Other (See Comments)    "Tapes tears off my skin"   Aspirin Other (See Comments)    Irritates the stomach   Atorvastatin Other (See Comments)    Caused fatigue   Diltiazem Hcl Nausea And Vomiting    Family History  Problem Relation Age of Onset   CAD Mother    Breast cancer Mother      Prior to Admission medications   Medication Sig Start Date End Date Taking? Authorizing Provider  amiodarone (PACERONE) 200 MG tablet Take 1 tablet (200 mg total) by mouth 2 (two) times daily for 6 days. 12/07/22 12/13/22  Barb Merino, MD  amiodarone (PACERONE) 200 MG tablet Take 1 tablet (200 mg total) by mouth daily. 12/14/22 01/13/23  Barb Merino, MD  amLODipine (NORVASC) 5 MG tablet Take 1 tablet (5 mg total) by mouth daily. 12/07/22 01/06/23  Barb Merino, MD  apixaban (ELIQUIS) 2.5 MG TABS tablet Take 1 tablet (2.5 mg total) by mouth 2 (two) times daily. 11/11/22   Argentina Donovan, PA-C  cholecalciferol (VITAMIN D3) 25 MCG (1000 UNIT) tablet Take 1,000 Units  by mouth daily. 10/15/22   [provider]  clotrimazole-betamethasone (LOTRISONE) cream Apply topically 2 (two) times daily. 11/11/22 11/11/23  Argentina Donovan, PA-C  diphenhydrAMINE (BENADRYL) 25 mg capsule Take 25 mg by mouth every 6 (six) hours as needed for itching or allergies. 10/15/22   [provider]  furosemide (LASIX) 40 MG tablet Take 1 tablet (40 mg total) by mouth daily. 12/07/22 01/06/23  Barb Merino, MD  hydrALAZINE (APRESOLINE) 50 MG tablet Take 1 tablet (50 mg total) by mouth every 8 (eight) hours. 12/07/22 01/06/23  Barb Merino,  MD  HYDROcodone bit-homatropine (HYCODAN) 5-1.5 MG/5ML syrup Take 5 mLs by mouth every 6 (six) hours as needed for cough. 12/08/22   Barb Merino, MD  levalbuterol Penne Lash) 1.25 MG/0.5ML nebulizer solution Take 1.25 mg by nebulization every 6 (six) hours as needed for wheezing or shortness of breath. 12/07/22 01/06/23  Barb Merino, MD  metoprolol tartrate (LOPRESSOR) 50 MG tablet Take 1 tablet (50 mg total) by mouth 2 (two) times daily. 12/07/22 01/06/23  Barb Merino, MD  polyethylene glycol (MIRALAX / GLYCOLAX) 17 g packet Take 17 g by mouth daily. 12/07/22   Barb Merino, MD  sennosides-docusate sodium (SENOKOT-S) 8.6-50 MG tablet Take 3 tablets by mouth at bedtime. Patient taking differently: Take 3 tablets by mouth at bedtime as needed for constipation. 10/01/21   [name], MD  VITAMIN D PO Take 1 capsule by mouth every 7 (seven) days.    [provider]    Physical Exam: Vitals:   12/29/22 0430 12/29/22 0440 12/29/22 0445 12/29/22 0455  BP: 106/61  111/76   Pulse: (!) 123 (!) 118 (!) 110 (!) 121  Resp: 16 14 20 17  $ Temp: 98 F (36.7 C)     TempSrc: Oral     SpO2: 100% 100% 99% 100%    Constitutional: NAD, no pallor or diaphoresis   Eyes: PERTLA, lids and conjunctivae normal ENMT: Mucous membranes are moist. Posterior pharynx clear of any exudate or lesions.   Neck: supple, no masses  Respiratory: coarse rhonchi bilaterally, no wheezing. No accessory muscle use.  Cardiovascular: S1 & S2 heard, regular rate and rhythm. Pitting edema b/ LEs.  Abdomen: No distension, no tenderness, soft. Bowel sounds active.  Musculoskeletal: no clubbing / cyanosis. No joint deformity upper and lower extremities.   Skin: no significant rashes, lesions, ulcers. Warm, dry, well-perfused. Neurologic: CN 2-12 grossly intact. Moving all extremities. Alert and oriented to person, place, and situation.  Psychiatric: Calm. Cooperative.    Labs and Imaging on Admission: I have  personally reviewed following labs and imaging studies  CBC: Recent Labs  Lab 12/29/22 0106 12/29/22 0131 12/29/22 0227 12/29/22 0251  WBC 6.5  --   --   --   NEUTROABS 4.6  --   --   --   HGB 7.3* 8.2* 7.1* 7.1*  HCT 23.5* 24.0* 21.0* 22.9*  MCV 89.0  --   --   --   PLT 226  --   --   --    Basic Metabolic Panel: Recent Labs  Lab 12/29/22 0106 12/29/22 0131 12/29/22 0227  NA 137 140 140  K 4.4 4.4 4.4  CL 101  --   --   CO2 25  --   --   GLUCOSE 104*  --   --   BUN 47*  --   --   CREATININE 3.63*  --   --   CALCIUM 8.6*  --   --  MG 2.0  --   --    GFR: CrCl cannot be calculated (Unknown ideal weight.). Liver Function Tests: Recent Labs  Lab 12/29/22 0106  AST 18  ALT 15  ALKPHOS 124  BILITOT 0.8  PROT 6.8  ALBUMIN 2.7*   No results for input(s): "LIPASE", "AMYLASE" in the last 168 hours. No results for input(s): "AMMONIA" in the last 168 hours. Coagulation Profile: No results for input(s): "INR", "PROTIME" in the last 168 hours. Cardiac Enzymes: No results for input(s): "CKTOTAL", "CKMB", "CKMBINDEX", "TROPONINI" in the last 168 hours. BNP (last 3 results) No results for input(s): "PROBNP" in the last 8760 hours. HbA1C: No results for input(s): "HGBA1C" in the last 72 hours. CBG: No results for input(s): "GLUCAP" in the last 168 hours. Lipid Profile: No results for input(s): "CHOL", "HDL", "LDLCALC", "TRIG", "CHOLHDL", "LDLDIRECT" in the last 72 hours. Thyroid Function Tests: No results for input(s): "TSH", "T4TOTAL", "FREET4", "T3FREE", "THYROIDAB" in the last 72 hours. Anemia Panel: No results for input(s): "VITAMINB12", "FOLATE", "FERRITIN", "TIBC", "IRON", "RETICCTPCT" in the last 72 hours. Urine analysis:    Component Value Date/Time   COLORURINE YELLOW 12/29/2022 0123   APPEARANCEUR HAZY (A) 12/29/2022 0123   LABSPEC 1.012 12/29/2022 0123   PHURINE 5.0 12/29/2022 0123   GLUCOSEU NEGATIVE 12/29/2022 0123   HGBUR LARGE (A) 12/29/2022 0123    BILIRUBINUR NEGATIVE 12/29/2022 0123   KETONESUR NEGATIVE 12/29/2022 0123   PROTEINUR 100 (A) 12/29/2022 0123   NITRITE NEGATIVE 12/29/2022 0123   LEUKOCYTESUR LARGE (A) 12/29/2022 0123   Sepsis Labs: @LABRCNTIP$ (procalcitonin:4,lacticidven:4) ) Recent Results (from the past 240 hour(s))  Resp panel by RT-PCR (RSV, Flu A&B, Covid) Anterior Nasal Swab     Status: None   Collection Time: 12/29/22  2:33 AM   Specimen: Anterior Nasal Swab  Result Value Ref Range Status   SARS Coronavirus 2 by RT PCR NEGATIVE NEGATIVE Final   Influenza A by PCR NEGATIVE NEGATIVE Final   Influenza B by PCR NEGATIVE NEGATIVE Final    Comment: (NOTE) The Xpert Xpress SARS-CoV-2/FLU/RSV plus assay is intended as an aid in the diagnosis of influenza from Nasopharyngeal swab specimens and should not be used as a sole basis for treatment. Nasal washings and aspirates are unacceptable for Xpert Xpress SARS-CoV-2/FLU/RSV testing.  Fact Sheet for Patients: EntrepreneurPulse.com.au  Fact Sheet for Healthcare Providers: IncredibleEmployment.be  This test is not yet approved or cleared by the [place] FDA and has been authorized for detection and/or diagnosis of SARS-CoV-2 by FDA under an Emergency Use Authorization (EUA). This EUA will remain in effect (meaning this test can be used) for the duration of the COVID-19 declaration under Section 564(b)(1) of the Act, 21 U.S.C. section 360bbb-3(b)(1), unless the authorization is terminated or revoked.     Resp Syncytial Virus by PCR NEGATIVE NEGATIVE Final    Comment: (NOTE) Fact Sheet for Patients: EntrepreneurPulse.com.au  Fact Sheet for Healthcare Providers: IncredibleEmployment.be  This test is not yet approved or cleared by the [place] FDA and has been authorized for detection and/or diagnosis of SARS-CoV-2 by FDA under an Emergency Use Authorization (EUA). This EUA  will remain in effect (meaning this test can be used) for the duration of the COVID-19 declaration under Section 564(b)(1) of the Act, 21 U.S.C. section 360bbb-3(b)(1), unless the authorization is terminated or revoked.  Performed at [facility], [place] [address], [place], [place] [address]      Radiological Exams on Admission: CT Chest Wo Contrast  Result Date: 12/29/2022 CLINICAL  DATA:  Respiratory illness, nondiagnostic xray. Hospice patient. EXAM: CT CHEST WITHOUT CONTRAST TECHNIQUE: Multidetector CT imaging of the chest was performed following the standard protocol without IV contrast. RADIATION DOSE REDUCTION: This exam was performed according to the departmental dose-optimization program which includes automated exposure control, adjustment of the mA and/or kV according to patient size and/or use of iterative reconstruction technique. COMPARISON:  None Available. FINDINGS: Cardiovascular: Mild coronary artery calcification. Global cardiac size within normal limits. Trace pericardial effusion. Central pulmonary arteries are enlarged in keeping with changes of pulmonary arterial hypertension. Mild atherosclerotic calcification within the thoracic aorta. No aortic aneurysm. Mediastinum/Nodes: 2.2 cm left thyroid nodule. In the setting of significant comorbidities or limited life expectancy, no follow-up recommended (ref: J Am Coll Radiol. 2015 Feb;12(2): 143-50).No pathologic thoracic adenopathy. Esophagus unremarkable. Lungs/Pleura: There is multifocal consolidation throughout the lungs, more severe within the upper lobes bilaterally most in keeping with multifocal infection in the acute setting. No pneumothorax. Small left pleural effusion with associated left basilar atelectasis. No central obstructing lesion. Upper Abdomen: Innumerable hypodense nodules are seen throughout the liver, not well characterized on this examination, but suspicious for metastatic disease. No acute abnormality.  Musculoskeletal: No acute bone abnormality. No lytic or blastic bone lesion. IMPRESSION: 1. Multifocal consolidation throughout the lungs, more severe within the upper lobes bilaterally, most in keeping with multifocal infection in the acute setting. 2. Small left pleural effusion with associated left basilar atelectasis. 3. Mild coronary artery calcification. 4. Morphologic changes in keeping with pulmonary arterial hypertension. 5. Innumerable hypodense nodules throughout the liver, not well characterized on this examination, but suspicious for metastatic disease. Correlation with history of primary malignancy is recommended. If indicated, dedicated contrast enhanced CT examination of the abdomen and pelvis would be helpful for further evaluation. Aortic Atherosclerosis (ICD10-I70.0). Electronically Signed   By: Fidela Salisbury M.D.   On: 12/29/2022 03:22   DG Chest Portable 1 View  Result Date: 12/29/2022 CLINICAL DATA:  Altered mental status EXAM: PORTABLE CHEST 1 VIEW COMPARISON:  12/05/2022 FINDINGS: Shallow inspiration. Cardiac enlargement. Perihilar infiltration similar to prior study with probable small pleural effusions. Increasing infiltration in the right upper lung. Changes most likely to represent perihilar edema but developing superimposed pneumonia could also be present. No pneumothorax. Mediastinal contours appear intact. Calcification of the aorta. IMPRESSION: Persistent cardiac enlargement with some perihilar infiltration or edema and small pleural effusions similar to prior study. Increasing airspace disease in the right upper lung may represent superimposed pneumonia. Electronically Signed   By: Lucienne Capers M.D.   On: 12/29/2022 01:28    EKG: Independently reviewed. Atrial fibrillation, rate 135, LVH with repolarization abnormality.   Assessment/Plan   1. Multifocal pneumonia; acute on chronic respiratory failure with hypoxia and hypercarbia  - Sent by family for AMS, pt reports  increased productive cough, and she is found to have acute on chronic respiratory failure with CT findings suggestive of multifocal pneumonia  - Blood cultures collected in ED and empiric antibiotics started  - Check strep pneumo and legionella antigens, treat with Rocephin and azithromycin, check/trend procalcitonin    2. Occult GI bleeding; anemia  - Hgb noted to be 7.3 in ED, down from 10.5 on January 24th  - No overt bleeding but FOBT is positive; no abdominal pain or vomiting; BUN is lower than it was during recent admission despite increased SCr  - She is being transfused 1 unit RBC in ED  - Hold Eliquis, trend blood counts  3. Atrial fibrillation with RVR  -  Rate 130s initially, improved to ~100 without intervention  - Continue amiodarone and metoprolol, hold Elquis for now in light of acute on chronic anemia with positive FOBT    4. AKI superimposed on CKD IV  - SCr is 3.63 on admission, up from 3.18 on January 24th and 2.2 November 27th  - Hold Lasix for now, renally-dose medications, monitor   5. Chronic HFpEF  - She has peripheral edema, no JVD, has not been weighed yet this admission  - Hold Lasix for now in light of AKI, continue beta-blocker, monitor wt and I/Os  6. Hypertension  - Continue amlodipine, hydralazine, and metoprolol    7. Acute encephalopathy  - Family reported patient has been altered for 3 days and she was combative with EMS   - She is currently calm and oriented  - Delirium precautions, treat acute illness    8. Liver lesions  - Innumerable liver lesions noted incidentally on CT chest in ED and are concerning for metastatic disease  - Pt's daughter interested in further workup; unable to get contrast-enhanced images d/t renal failure, could consider outpatient PET or non-contrast CTs    DVT prophylaxis: SCDs  Code Status: DNR  Level of Care: Level of care: Progressive Family Communication: Daughter updated from ED  Disposition Plan:  Patient is  from: home hospice  Anticipated d/c is to: Home hospice  Anticipated d/c date is: 01/01/23  Patient currently: Pending stable H&H, stable HR, stable respiratory status  Consults called: None  Admission status: Inpatient     Vianne Bulls, MD [facility]  12/29/2022, 5:18 AM

## 2022-12-29 NOTE — TOC Progression Note (Signed)
Transition of Care [facility]) - Progression Note    Patient Details  Name: Oviya Cree MRN: [id number] Date of Birth: 09-29-1936  Transition of [facility]) CM/SW Contact  Carles Collet, RN Phone Number: 12/29/2022, 2:41 PM  Clinical Narrative:     Notified Sarah with [facility] that patient has been admitted to [facility]. Patient was on home hospice w [facility] prior to admission. Judson Roch states she has met with patient while in ED.        Expected Discharge Plan and Services                                               Social Determinants of Health (SDOH) Interventions SDOH Screenings   Depression (PHQ2-9): Low Risk  (11/11/2022)  Tobacco Use: Low Risk  (12/29/2022)    Readmission Risk Interventions    12/08/2022   11:52 AM  Readmission Risk Prevention Plan  Transportation Screening Complete  PCP or Specialist Appt within 3-5 Days Not Complete  Not Complete comments home w/ hospice  HRI or [place] Complete  Social Work Consult for [place] Planning/Counseling Complete  Palliative Care Screening Complete  Medication Review Press photographer) Complete

## 2022-12-29 NOTE — Progress Notes (Signed)
[facility]) Hospital Liaison Note    Mrs. Ureste is a current [place] patient with a terminal diagnosis of cerebral vascular disease. Patient was transported to the [facility] ED from home on 2.14 with family reports of altered mental status, red tinted urine. Patient is currently on antibiotics for UTI. Mrs. Ureta was admitted for Multifocal pneumonia requiring IV antibiotics. Per [facility] Dr. Karie Georges, this is a related hospital admission.    Visited Mrs. Lipuma at the bedside in the ED. Patient was asleep during my visit. No family present at bedside. Patient is currently receiving IV antibiotics for multifocal pneumonia. Patient received one unit of blood in the ED for anemia. Report exchanged with RN. PMT spoke with daughter. Spoke with daughter via telephone. Daughter stated that she wants to treat the pneumonia and see how she does with that. Daughter stated that she may be interested in having the patient assessed for [facility] depending on how she does. I provided some information and answered questions.    Mrs. Otterstrom is inpatient appropriate due to requiring IV antibiotics for treatment of multifocal pneumonia.   V/S: 116/68 bp, 97.7 Temp, 106 bpm, 26 RR, 95% on 2L via [place]   I/O: 0/200   Labs:  Glucose: 104 (H) BUN: 47 (H) Creatinine: 3.63 (H) Calcium: 8.6 (L) Albumin: 2.7 (L) GFR, Estimated: 12 (L) RBC: 2.64 (L) Hemoglobin: 7.3 (L) HCT: 23.5 (L) RDW: 16.9 (H) VENOUS pH, Ven: 7.443 (H) pCO2, Ven: 39.7 (L) pO2, Ven: 79 (H) Acid-Base Excess: 3.0 (H) Calcium Ionized: 1.11 (L) Hemoglobin: 8.2 (L) HCT: 24.0 (L) ARTERIAL pH, Arterial: 7.330 (L) pCO2 arterial: 53.1 (H) pO2, Arterial: 55 (L)   Diagnostics:  CT of Chest IMPRESSION: 1. Multifocal consolidation throughout the lungs, more severe within the upper lobes bilaterally, most in keeping with multifocal infection in the acute setting. 2. Small left pleural effusion with associated left  basilar atelectasis. 3. Mild coronary artery calcification. 4. Morphologic changes in keeping with pulmonary arterial hypertension. 5. Innumerable hypodense nodules throughout the liver, not well characterized on this examination, but suspicious for metastatic disease. Correlation with history of primary malignancy is recommended. If indicated, dedicated contrast enhanced CT examination of the abdomen and pelvis would be helpful for further evaluation.  Chest X-ray IMPRESSION: Persistent cardiac enlargement with some perihilar infiltration or edema and small pleural effusions similar to prior study. Increasing airspace disease in the right upper lung may represent superimposed pneumonia.  IV/PRN:    cefTRIAXone (ROCEPHIN) 2 g in sodium chloride 0.9 % 100 mL IVPB Dose: 2 g Freq: Daily Route: IV x1  azithromycin (ZITHROMAX) 500 mg in sodium chloride 0.9 % 250 mL IVPB Dose: 500 mg Freq: Daily Route: IV x1  vancomycin (VANCOCIN) IVPB 1000 mg/200 mL premix Dose: 1,000 mg Freq:  Once Route: IV x1  acetaminophen (TYLENOL) tablet 650 mg Dose: 650 mg Freq: Every 6 hours PRN Route: PO PRN Reason: fever x1  guaiFENesin-dextromethorphan (ROBITUSSIN DM) 100-10 MG/5ML syrup 5 mL Dose: 5 mL Freq: Every 4 hours PRN Route: PO PRN Reason: cough x1  levalbuterol (XOPENEX) nebulizer solution 1.25 mg Dose: 1.25 mg Freq: Every 6 hours PRN Route: NEBULIZATION PRN Reasons: wheezing,shortness of breath x1  Problem list: Assessment/Plan    1. Multifocal pneumonia; acute on chronic respiratory failure with hypoxia and hypercarbia  - Sent by family for AMS, pt reports increased productive cough, and she is found to have acute on chronic respiratory failure with CT findings suggestive of multifocal pneumonia  - Blood  cultures collected in ED and empiric antibiotics started  - Check strep pneumo and legionella antigens, treat with Rocephin and azithromycin, check/trend procalcitonin     2.  Occult GI bleeding; anemia  - Hgb noted to be 7.3 in ED, down from 10.5 on January 24th  - No overt bleeding but FOBT is positive; no abdominal pain or vomiting; BUN is lower than it was during recent admission despite increased SCr  - She is being transfused 1 unit RBC in ED  - Hold Eliquis, trend blood counts   3. Atrial fibrillation with RVR  - Rate 130s initially, improved to ~100 without intervention  - Continue amiodarone and metoprolol, hold Elquis for now in light of acute on chronic anemia with positive FOBT     4. AKI superimposed on CKD IV  - SCr is 3.63 on admission, up from 3.18 on January 24th and 2.2 November 27th  - Hold Lasix for now, renally-dose medications, monitor    5. Chronic HFpEF  - She has peripheral edema, no JVD, has not been weighed yet this admission  - Hold Lasix for now in light of AKI, continue beta-blocker, monitor wt and I/Os   6. Hypertension  - Continue amlodipine, hydralazine, and metoprolol     7. Acute encephalopathy  - Family reported patient has been altered for 3 days and she was combative with EMS   - She is currently calm and oriented  - Delirium precautions, treat acute illness     8. Liver lesions  - Innumerable liver lesions noted incidentally on CT chest in ED and are concerning for metastatic disease  - Pt's daughter interested in further workup; unable to get contrast-enhanced images d/t renal failure, could consider outpatient PET or non-contrast CTs    GOC: Patient is a DNR.     D/C planning: Ongoing.     Family: Spoke with daughter via telephone.   IDT: Updated   Should patient need ambulance transfer - Please use [facility]) as they contract this service for our active hospice patients.    Zigmund Gottron, RN [facility] Liaison  [phone]

## 2022-12-29 NOTE — Progress Notes (Signed)
Family updated in room and by phone on patient status and plan of care.  Answered all questions for Beverely Low )nephew) in room and Madalyn Rob (daughter) by phone.

## 2022-12-29 NOTE — ED Notes (Signed)
Charge RN & this RN make executive decision that pt is safe to be transported by tech to the floor.

## 2022-12-29 NOTE — ED Notes (Signed)
Patient transported to CT 

## 2022-12-29 NOTE — ED Notes (Signed)
Unable to obtain 2nd set of blood cultures at this time d/t difficult stick. Pt about to receive blood transfusion at this time.

## 2022-12-29 NOTE — ED Notes (Signed)
When looking in chart before taking pt up, states that pt is progressive. RN said that pt is not progressive and its okay to take the pt up

## 2022-12-29 NOTE — ED Provider Notes (Signed)
[facility] Provider Note   CSN: [id number] Arrival date & time: 12/29/22  0051     History  Chief Complaint  Patient presents with   Altered Mental Status    Jillian [name] is a 87 y.o. female.  HPI     This is an 87 year old female with a history of hypertension, stroke, chronic respiratory failure, atrial fibrillation who presents with altered mental status.  Patient presents per EMS.  She is currently on hospice.  Recent hospitalization for A-fib and respiratory failure.  Per family had been more altered in the last 2 to 3 days.  Currently on antibiotics for UTI.  She has a chronic indwelling Foley catheter and is on 4 L of oxygen chronically.  On my evaluation, patient is without complaint.  Denies chest pain, shortness of breath.  She is oriented x 2.  Per EMS, patient was administered Haldol and morphine by family prior to their arrival.  Home Medications Prior to Admission medications   Medication Sig Start Date End Date Taking? Authorizing Provider  amiodarone (PACERONE) 200 MG tablet Take 1 tablet (200 mg total) by mouth 2 (two) times daily for 6 days. 12/07/22 12/13/22  Barb Merino, MD  amiodarone (PACERONE) 200 MG tablet Take 1 tablet (200 mg total) by mouth daily. 12/14/22 01/13/23  Barb Merino, MD  amLODipine (NORVASC) 5 MG tablet Take 1 tablet (5 mg total) by mouth daily. 12/07/22 01/06/23  Barb Merino, MD  apixaban (ELIQUIS) 2.5 MG TABS tablet Take 1 tablet (2.5 mg total) by mouth 2 (two) times daily. 11/11/22   Argentina Donovan, PA-C  cholecalciferol (VITAMIN D3) 25 MCG (1000 UNIT) tablet Take 1,000 Units by mouth daily. 10/15/22   [provider]  clotrimazole-betamethasone (LOTRISONE) cream Apply topically 2 (two) times daily. 11/11/22 11/11/23  Argentina Donovan, PA-C  diphenhydrAMINE (BENADRYL) 25 mg capsule Take 25 mg by mouth every 6 (six) hours as needed for itching or allergies. 10/15/22   [provider]  furosemide (LASIX) 40 MG tablet Take 1 tablet (40 mg total) by mouth daily. 12/07/22 01/06/23  Barb Merino, MD  hydrALAZINE (APRESOLINE) 50 MG tablet Take 1 tablet (50 mg total) by mouth every 8 (eight) hours. 12/07/22 01/06/23  Barb Merino, MD  HYDROcodone bit-homatropine (HYCODAN) 5-1.5 MG/5ML syrup Take 5 mLs by mouth every 6 (six) hours as needed for cough. 12/08/22   Barb Merino, MD  levalbuterol Penne Lash) 1.25 MG/0.5ML nebulizer solution Take 1.25 mg by nebulization every 6 (six) hours as needed for wheezing or shortness of breath. 12/07/22 01/06/23  Barb Merino, MD  metoprolol tartrate (LOPRESSOR) 50 MG tablet Take 1 tablet (50 mg total) by mouth 2 (two) times daily. 12/07/22 01/06/23  Barb Merino, MD  polyethylene glycol (MIRALAX / GLYCOLAX) 17 g packet Take 17 g by mouth daily. 12/07/22   Barb Merino, MD  sennosides-docusate sodium (SENOKOT-S) 8.6-50 MG tablet Take 3 tablets by mouth at bedtime. Patient taking differently: Take 3 tablets by mouth at bedtime as needed for constipation. 10/01/21   [name], MD  VITAMIN D PO Take 1 capsule by mouth every 7 (seven) days.    [provider]      Allergies    Other, Latex, Penicillins, Tape, Aspirin, Atorvastatin, and Diltiazem hcl    Review of Systems   Review of Systems  Constitutional:  Negative for fever.  Respiratory:  Negative for shortness of breath.   Cardiovascular:  Negative for chest pain.  Psychiatric/Behavioral:  Positive for confusion.   All other systems reviewed and are negative.   Physical Exam Updated Vital Signs BP 123/66   Pulse (!) 119   Temp 99.5 F (37.5 C) (Rectal)   Resp 18   SpO2 96%  Physical Exam Vitals and nursing note reviewed.  Constitutional:      Appearance: She is well-developed.     Comments: Elderly, chronically ill-appearing, nontoxic appearing  HENT:     Head: Normocephalic and atraumatic.     Mouth/Throat:     Mouth: Mucous membranes are moist.   Eyes:     Pupils: Pupils are equal, round, and reactive to light.  Cardiovascular:     Rate and Rhythm: Tachycardia present. Rhythm irregular.     Heart sounds: Normal heart sounds.  Pulmonary:     Effort: Pulmonary effort is normal. No respiratory distress.     Breath sounds: Rales present. No wheezing.     Comments: Rales in all lung fields, nasal cannula in place, no respiratory distress Abdominal:     Palpations: Abdomen is soft.     Tenderness: There is no abdominal tenderness.  Genitourinary:    Comments: Foley catheter in place with blood-tinged urine Musculoskeletal:     Cervical back: Neck supple.  Skin:    General: Skin is warm and dry.  Neurological:     Mental Status: She is alert.     Comments: Oriented to self and place but not time  Psychiatric:        Mood and Affect: Mood normal.     ED Results / Procedures / Treatments   Labs (all labs ordered are listed, but only abnormal results are displayed) Labs Reviewed  CBC WITH DIFFERENTIAL/PLATELET - Abnormal; Notable for the following components:      Result Value   RBC 2.64 (*)    Hemoglobin 7.3 (*)    HCT 23.5 (*)    RDW 16.9 (*)    All other components within normal limits  COMPREHENSIVE METABOLIC PANEL - Abnormal; Notable for the following components:   Glucose, Bld 104 (*)    BUN 47 (*)    Creatinine, Ser 3.63 (*)    Calcium 8.6 (*)    Albumin 2.7 (*)    GFR, Estimated 12 (*)    All other components within normal limits  URINALYSIS, ROUTINE W REFLEX MICROSCOPIC - Abnormal; Notable for the following components:   APPearance HAZY (*)    Hgb urine dipstick LARGE (*)    Protein, ur 100 (*)    Leukocytes,Ua LARGE (*)    Bacteria, UA FEW (*)    All other components within normal limits  HEMOGLOBIN AND HEMATOCRIT, BLOOD - Abnormal; Notable for the following components:   Hemoglobin 7.1 (*)    HCT 22.9 (*)    All other components within normal limits  I-STAT ARTERIAL BLOOD GAS, ED - Abnormal; Notable  for the following components:   pH, Arterial 7.330 (*)    pCO2 arterial 53.1 (*)    pO2, Arterial 55 (*)    HCT 21.0 (*)    Hemoglobin 7.1 (*)    All other components within normal limits  I-STAT VENOUS BLOOD GAS, ED - Abnormal; Notable for the following components:   pH, Ven 7.443 (*)    pCO2, Ven 39.7 (*)    pO2, Ven 79 (*)    Acid-Base Excess 3.0 (*)    Calcium, Ion 1.11 (*)    HCT 24.0 (*)    Hemoglobin 8.2 (*)  All other components within normal limits  POC OCCULT BLOOD, ED - Abnormal; Notable for the following components:   Fecal Occult Bld POSITIVE (*)    All other components within normal limits  RESP PANEL BY RT-PCR (RSV, FLU A&B, COVID)  RVPGX2  CULTURE, BLOOD (ROUTINE X 2)  CULTURE, BLOOD (ROUTINE X 2)  MAGNESIUM  LACTIC ACID, PLASMA  LACTIC ACID, PLASMA  TYPE AND SCREEN  PREPARE RBC (CROSSMATCH)  ABO/RH    EKG EKG Interpretation  Date/Time:  Wednesday December 29 2022 01:20:58 EST Ventricular Rate:  135 PR Interval:    QRS Duration: 92 QT Interval:  297 QTC Calculation: 446 R Axis:   29 Text Interpretation: Atrial fibrillation LVH with secondary repolarization abnormality Confirmed by Thayer Jew [phone]) on 12/29/2022 1:25:34 AM  Radiology CT Chest Wo Contrast  Result Date: 12/29/2022 CLINICAL DATA:  Respiratory illness, nondiagnostic xray. Hospice patient. EXAM: CT CHEST WITHOUT CONTRAST TECHNIQUE: Multidetector CT imaging of the chest was performed following the standard protocol without IV contrast. RADIATION DOSE REDUCTION: This exam was performed according to the departmental dose-optimization program which includes automated exposure control, adjustment of the mA and/or kV according to patient size and/or use of iterative reconstruction technique. COMPARISON:  None Available. FINDINGS: Cardiovascular: Mild coronary artery calcification. Global cardiac size within normal limits. Trace pericardial effusion. Central pulmonary arteries are enlarged in  keeping with changes of pulmonary arterial hypertension. Mild atherosclerotic calcification within the thoracic aorta. No aortic aneurysm. Mediastinum/Nodes: 2.2 cm left thyroid nodule. In the setting of significant comorbidities or limited life expectancy, no follow-up recommended (ref: J Am Coll Radiol. 2015 Feb;12(2): 143-50).No pathologic thoracic adenopathy. Esophagus unremarkable. Lungs/Pleura: There is multifocal consolidation throughout the lungs, more severe within the upper lobes bilaterally most in keeping with multifocal infection in the acute setting. No pneumothorax. Small left pleural effusion with associated left basilar atelectasis. No central obstructing lesion. Upper Abdomen: Innumerable hypodense nodules are seen throughout the liver, not well characterized on this examination, but suspicious for metastatic disease. No acute abnormality. Musculoskeletal: No acute bone abnormality. No lytic or blastic bone lesion. IMPRESSION: 1. Multifocal consolidation throughout the lungs, more severe within the upper lobes bilaterally, most in keeping with multifocal infection in the acute setting. 2. Small left pleural effusion with associated left basilar atelectasis. 3. Mild coronary artery calcification. 4. Morphologic changes in keeping with pulmonary arterial hypertension. 5. Innumerable hypodense nodules throughout the liver, not well characterized on this examination, but suspicious for metastatic disease. Correlation with history of primary malignancy is recommended. If indicated, dedicated contrast enhanced CT examination of the abdomen and pelvis would be helpful for further evaluation. Aortic Atherosclerosis (ICD10-I70.0). Electronically Signed   By: Fidela Salisbury M.D.   On: 12/29/2022 03:22   DG Chest Portable 1 View  Result Date: 12/29/2022 CLINICAL DATA:  Altered mental status EXAM: PORTABLE CHEST 1 VIEW COMPARISON:  12/05/2022 FINDINGS: Shallow inspiration. Cardiac enlargement. Perihilar  infiltration similar to prior study with probable small pleural effusions. Increasing infiltration in the right upper lung. Changes most likely to represent perihilar edema but developing superimposed pneumonia could also be present. No pneumothorax. Mediastinal contours appear intact. Calcification of the aorta. IMPRESSION: Persistent cardiac enlargement with some perihilar infiltration or edema and small pleural effusions similar to prior study. Increasing airspace disease in the right upper lung may represent superimposed pneumonia. Electronically Signed   By: Lucienne Capers M.D.   On: 12/29/2022 01:28    Procedures .Critical Care  Performed by: Merryl Hacker, MD Authorized by:  Kenecia Barren, Barbette Hair, MD   Critical care provider statement:    Critical care time (minutes):  60   Critical care was necessary to treat or prevent imminent or life-threatening deterioration of the following conditions:  Respiratory failure (blood loss anemia)   Critical care was time spent personally by me on the following activities:  Development of treatment plan with patient or surrogate, discussions with consultants, evaluation of patient's response to treatment, examination of patient, ordering and review of laboratory studies, ordering and review of radiographic studies, ordering and performing treatments and interventions, pulse oximetry, re-evaluation of patient's condition and review of old charts     Medications Ordered in ED Medications  acetaminophen (TYLENOL) tablet 650 mg (650 mg Oral Given 12/29/22 0341)  0.9 %  sodium chloride infusion (Manually program via Guardrails IV Fluids) (has no administration in time range)  vancomycin (VANCOCIN) IVPB 1000 mg/200 mL premix (has no administration in time range)    ED Course/ Medical Decision Making/ A&P Clinical Course as of 12/29/22 0405  Wed Dec 29, 2022  0250 I spoke to the patient's daughter.  States that she has had increased agitation especially in  the evening hours.  She has also been complaining of shortness of breath at home.  Hospice is coming to the house and was there twice yesterday.  Daughter states that she was being treated for UTI but stopped antibiotics 3 days ago.  They have not noted any bloody stools. [CH]  0402 Spoke again with the daughter.  Updated her.  She would like her mother to receive blood.  I also discussed with her CT findings including the incidental finding of multiple nodules on the liver.  Although her mother is in hospice, she would like to pursue workup to know for sure.  Discussed with her that given her mother's functional status, metastatic cancer would not be a favorable prognosis.  She understands. [CH]    Clinical Course User Index [CH] Ammar Moffatt, Barbette Hair, MD                             Medical Decision Making Amount and/or Complexity of Data Reviewed Labs: ordered. Radiology: ordered.  Risk OTC drugs. Prescription drug management.   This patient presents to the ED for concern of altered mental status, shortness of breath, this involves an extensive number of treatment options, and is a complaint that carries with it a high risk of complications and morbidity.  I considered the following differential and admission for this acute, potentially life threatening condition.  The differential diagnosis includes metabolic encephalopathy, hypoxic or hypercapnic respiratory failure causing altered mental status, pneumonia, CHF, A-fib with RVR, PE  MDM:    This is an 87 year old female who presents with concerns for confusion and shortness of breath.  She is without complaint on my evaluation.  She is in no respiratory distress.  She is oriented x 2.  Daughter describes worsening combativeness and confusion especially in the evening hours.  May be sundowning.  She does state that she has been complaining of more shortness of breath.  She has Rales in both lung fields.  She is afebrile.  She is in atrial  fibrillation with RVR.  Previously admitted and flu positive.  Labs obtained and notable for no leukocytosis.  Normal lactate.  Creatinine is at baseline.  She does have a hemoglobin of 7.3 which was confirmed on repeat.  Denies any bloody stools.  Does note  some hematuria.  She has no gross blood on rectal exam but is Hemoccult positive.  She is on Eliquis.  Urinalysis shows large leukocyte Estrace, greater than 50 white cells and few bacteria.  She has a chronic urinary catheter.  Culture will be sent.  ABG shows a slight respiratory acidosis.  X-ray shows persistent cardiac enlargement with effusions and some airspace disease concerning for superimposed pneumonia.  She is afebrile without leukocytosis but does endorse shortness of breath.  Will obtain a CT for better valuation.  CT scan is concerning for possible infectious etiology and multifocal pneumonia.  Vancomycin and cefepime ordered.  She was also typed and screened and ordered free of 1 unit of blood given her history of A-fib.  Daughter would like a transfusion.  Of note, CT scan also shows innumerable liver nodules concerning for metastatic disease.  No history of cancer.  I briefly discussed this with the daughter over the phone.  At this time she would like to pursue what ever workup necessary to know whether or not this is actually cancer.  Will defer to admission team.  Patient is DNR.  (Labs, imaging, consults)  Labs: I Ordered, and personally interpreted labs.  The pertinent results include: CBC, BMP, COVID, influenza testing, type and screen  Imaging Studies ordered: I ordered imaging studies including CT chest, chest x-ray I independently visualized and interpreted imaging. I agree with the radiologist interpretation  Additional history obtained from daughter over the phone.  External records from outside source obtained and reviewed including recent discharge  Cardiac Monitoring: The patient was maintained on a cardiac monitor.   If on the cardiac monitor, I personally viewed and interpreted the cardiac monitored which showed an underlying rhythm of: Atrial fibrillation  Reevaluation: After the interventions noted above, I reevaluated the patient and found that they have :stayed the same  Social Determinants of Health:  lives with daughter, on hospice  Disposition: Admit  Co morbidities that complicate the patient evaluation  Past Medical History:  Diagnosis Date   Acute CVA (cerebrovascular accident) ([place]) 07/12/2020   Hypertension    Renal disorder    CKD Stage IV   Stroke ([place])      Medicines Meds ordered this encounter  Medications   acetaminophen (TYLENOL) tablet 650 mg   0.9 %  sodium chloride infusion (Manually program via Guardrails IV Fluids)   vancomycin (VANCOCIN) IVPB 1000 mg/200 mL premix    Order Specific Question:   Indication:    Answer:   Pneumonia    I have reviewed the patients home medicines and have made adjustments as needed  Problem List / ED Course: Problem List Items Addressed This Visit   None Visit Diagnoses     Atrial fibrillation with RVR ([place])    -  Primary   Symptomatic anemia       Multifocal pneumonia       Relevant Medications   vancomycin (VANCOCIN) IVPB 1000 mg/200 mL premix (Start on 12/29/2022  4:15 AM)   Liver nodule                       Final Clinical Impression(s) / ED Diagnoses Final diagnoses:  Atrial fibrillation with RVR ([place])  Symptomatic anemia  Multifocal pneumonia  Liver nodule    Rx / DC Orders ED Discharge Orders     None         Merryl Hacker, MD 12/29/22 0410

## 2022-12-29 NOTE — Progress Notes (Signed)
Patient seen and examined.  Remains in the emergency room. Brief history, 87 year old with CKD stage IV, chronic diastolic heart failure, history of stroke, hypertension, paroxysmal A-fib on Eliquis who was recently admitted to the hospital and discharged on 1/23 with home hospice program due to severe untreatable medical conditions.  She was admitted to [facility] care hospice at home.  She does have adequate support system at home.  Family found that she was more tired and having trouble breathing for last few days so brought back to the emergency room.  In the emergency room she is on 4 L oxygen.  A-fib with RVR heart rate 135.  CT scan of the chest with multifocal consolidations, multiple hypoattenuation lesions on her liver.  Creatinine 3.63, hemoglobin 7.3, FOBT positive.  She has indwelling Foley catheter that was recently placed at home.  Reportedly she was just treated for UTI.  Patient was started on 1 unit of PRBC, she was started on vancomycin, Rocephin and azithromycin for multifocal pneumonia and admitted to the hospital.   Multifocal pneumonia with acute hypoxemic respiratory failure, likely ongoing aspiration. Multiple liver lesions likely metastatic disease, not a candidate for contrasted studies or biopsy. Severe frailty and debility.  Failure to thrive. A-fib with RVR, currently rate controlled.  Unable to anticoagulate due to hematuria and dropping hemoglobin. Goal of care, comfort.  Symptom control and hospice placement.  Detailed discussion with patient's granddaughter Ms [name] on the phone.  Patient well-known to me from previous admission and discharged with hospice program.  Family agrees that patient is appropriate for hospice program and they are even looking at inpatient hospice placement if possible.  Currently, desire to continue antibiotics and improve her breathing if possible.  Will continue antibiotics, chest physiotherapy, bronchodilators and cough medications. Flush  Foley catheter to prevent from blood clot.  Appreciate palliative care consultation and coordination.  Will attempt symptom control, however focus on comfort.   Same-day admit.  No charge visit.

## 2022-12-29 NOTE — Progress Notes (Signed)
Patient is admitted for Pneumonia and A-Fib RVR. Treatment plan includes increased pulse and respiratory rate.  12/29/22 1500  Assess: MEWS Score  Temp 97.7 F (36.5 C)  BP 116/68  MAP (mmHg) 84  Pulse Rate (!) 106  Resp (!) 26  Level of Consciousness Alert  SpO2 95 %  O2 Device Nasal Cannula  O2 Flow Rate (L/min) 2 L/min  Assess: MEWS Score  MEWS Temp 0  MEWS Systolic 0  MEWS Pulse 1  MEWS RR 2  MEWS LOC 0  MEWS Score 3  MEWS Score Color Yellow  Assess: if the MEWS score is Yellow or Red  Were vital signs taken at a resting state? Yes  Focused Assessment Change from prior assessment (see assessment flowsheet)  Does the patient meet 2 or more of the SIRS criteria? Yes  Does the patient have a confirmed or suspected source of infection? No  Provider and Rapid Response Notified? No  MEWS guidelines implemented  Yes, yellow  Treat  MEWS Interventions Considered administering scheduled or prn medications/treatments as ordered  Pain Scale 0-10  Pain Score 0  Take Vital Signs  Increase Vital Sign Frequency  Yellow: Q2hr x1, continue Q4hrs until patient remains green for 12hrs  Escalate  MEWS: Escalate Yellow: Discuss with charge nurse and consider notifying provider and/or RRT  Notify: Charge Nurse/RN  Name of Charge Nurse/RN Notified Dianna  Date Charge Nurse/RN Notified 12/29/22  Time Charge Nurse/RN Notified 1355  Assess: SIRS CRITERIA  SIRS Temperature  0  SIRS Pulse 1  SIRS Respirations  1  SIRS WBC 0  SIRS Score Sum  2

## 2022-12-30 ENCOUNTER — Inpatient Hospital Stay (HOSPITAL_COMMUNITY)

## 2022-12-30 DIAGNOSIS — G9340 Encephalopathy, unspecified: Secondary | ICD-10-CM | POA: Diagnosis not present

## 2022-12-30 DIAGNOSIS — I4891 Unspecified atrial fibrillation: Secondary | ICD-10-CM | POA: Diagnosis not present

## 2022-12-30 DIAGNOSIS — K769 Liver disease, unspecified: Secondary | ICD-10-CM | POA: Diagnosis not present

## 2022-12-30 DIAGNOSIS — J189 Pneumonia, unspecified organism: Secondary | ICD-10-CM | POA: Diagnosis not present

## 2022-12-30 LAB — CBC
HCT: 26.6 % — ABNORMAL LOW (ref 36.0–46.0)
Hemoglobin: 8.4 g/dL — ABNORMAL LOW (ref 12.0–15.0)
MCH: 27.4 pg (ref 26.0–34.0)
MCHC: 31.6 g/dL (ref 30.0–36.0)
MCV: 86.6 fL (ref 80.0–100.0)
Platelets: 225 K/uL (ref 150–400)
RBC: 3.07 MIL/uL — ABNORMAL LOW (ref 3.87–5.11)
RDW: 16.5 % — ABNORMAL HIGH (ref 11.5–15.5)
WBC: 5.7 K/uL (ref 4.0–10.5)
nRBC: 0 % (ref 0.0–0.2)

## 2022-12-30 LAB — BRAIN NATRIURETIC PEPTIDE: B Natriuretic Peptide: 846.7 pg/mL — ABNORMAL HIGH (ref 0.0–100.0)

## 2022-12-30 LAB — BASIC METABOLIC PANEL WITH GFR
Anion gap: 12 (ref 5–15)
BUN: 48 mg/dL — ABNORMAL HIGH (ref 8–23)
CO2: 23 mmol/L (ref 22–32)
Calcium: 8.3 mg/dL — ABNORMAL LOW (ref 8.9–10.3)
Chloride: 103 mmol/L (ref 98–111)
Creatinine, Ser: 3.52 mg/dL — ABNORMAL HIGH (ref 0.44–1.00)
GFR, Estimated: 12 mL/min — ABNORMAL LOW
Glucose, Bld: 82 mg/dL (ref 70–99)
Potassium: 4.6 mmol/L (ref 3.5–5.1)
Sodium: 138 mmol/L (ref 135–145)

## 2022-12-30 LAB — MAGNESIUM: Magnesium: 2.1 mg/dL (ref 1.7–2.4)

## 2022-12-30 LAB — TYPE AND SCREEN
ABO/RH(D): B POS
Antibody Screen: NEGATIVE
Unit division: 0

## 2022-12-30 LAB — BPAM RBC
Blood Product Expiration Date: 202403012359
ISSUE DATE / TIME: 202402140406
Unit Type and Rh: 7300

## 2022-12-30 LAB — URINE CULTURE: Culture: NO GROWTH

## 2022-12-30 LAB — ABO/RH: ABO/RH(D): B POS

## 2022-12-30 LAB — PROCALCITONIN: Procalcitonin: 0.79 ng/mL

## 2022-12-30 MED ORDER — LORAZEPAM 2 MG/ML IJ SOLN: 0.5000 mg | INTRAMUSCULAR | Status: DC | PRN

## 2022-12-30 MED ORDER — CEFDINIR 300 MG PO CAPS: 300.0000 mg | ORAL_CAPSULE | Freq: Two times a day (BID) | ORAL | 0 refills | Status: AC

## 2022-12-30 MED ORDER — GLYCOPYRROLATE 0.2 MG/ML IJ SOLN: 0.2000 mg | INTRAMUSCULAR | Status: DC | PRN

## 2022-12-30 MED ORDER — METOPROLOL TARTRATE 5 MG/5ML IV SOLN
5.0000 mg | Freq: Once | INTRAVENOUS | Status: AC
  Administered 2022-12-30: 5 mg via INTRAVENOUS
  Filled 2022-12-30: qty 5

## 2022-12-30 MED ORDER — FUROSEMIDE 10 MG/ML IJ SOLN
40.0000 mg | Freq: Once | INTRAMUSCULAR | Status: AC
  Administered 2022-12-30: 40 mg via INTRAVENOUS
  Filled 2022-12-30: qty 4

## 2022-12-30 MED ORDER — HYDROMORPHONE HCL 1 MG/ML IJ SOLN: 0.5000 mg | INTRAMUSCULAR | Status: DC | PRN

## 2022-12-30 MED ORDER — GLYCOPYRROLATE 1 MG PO TABS: 1.0000 mg | ORAL_TABLET | ORAL | Status: DC | PRN

## 2022-12-30 NOTE — Progress Notes (Signed)
Daily Progress Note   Patient Name: Jillian [name]       Date: 12/30/2022 DOB: 05-02-1936  Age: 87 y.o. MRN#: [id number] Attending Physician: Barb Merino, MD Primary Care Physician: [name], MD Admit Date: 12/29/2022  Reason for Consultation/Follow-up: Establishing goals of care  Subjective: Medical records reviewed including progress notes, labs, imaging. Patient assessed at the bedside.  She reports feeling "so-so" and denies pain or respiratory distress.  Discussed with RN.  Patient is speaking with chaplain Sallyanne Kuster during my visit.  No family present.  Created space and opportunity for patient's thoughts and feelings on her current illness.  She is appreciative of all the support and has no questions or concerns at this time.  Encourage her to let us know should any disturbing symptoms arise for prompt management.  I then called patient's daughter [name] for ongoing goals of care discussions.  We discussed last night's events and her desire for patient to continue taking antibiotics on comfort care.  Discussed the option of transitioning to oral antibiotics and shared my recommendations for transfer to residential hospice as we discussed yesterday.  She is agreeable to this as long as oral antibiotics can be continued at [facility].   Questions and concerns addressed. PMT will continue to support holistically.   Length of Stay: 1  Physical Exam Vitals and nursing note reviewed.  Constitutional:      General: She is not in acute distress.    Appearance: She is ill-appearing.     Interventions: Nasal cannula in place.     Comments: 4L  Cardiovascular:     Rate and Rhythm: Tachycardia present.  Pulmonary:     Effort: Pulmonary effort is normal. No respiratory  distress.  Neurological:     Mental Status: She is alert.  Psychiatric:        Mood and Affect: Mood normal.        Behavior: Behavior normal.         Vital Signs: BP 117/66 (BP Location: Left Arm)   Pulse (!) 114   Temp 98.3 F (36.8 C) (Oral)   Resp (!) 23   Ht 5' 1"$  (1.549 m)   Wt 84.4 kg   SpO2 97%   BMI 35.16 kg/m  SpO2: SpO2: 97 % O2 Device: O2 Device: Nasal Cannula O2  Flow Rate: O2 Flow Rate (L/min): 4 L/min      Palliative Assessment/Data: 20%   Palliative Care Assessment & Plan   Patient Profile: 87 y.o. female  with past medical history of CKD stage IV, chronic diastolic heart failure, history of stroke, hypertension, paroxysmal A-fib on Eliquis admitted on 12/29/2022 with altered mental status and cough.    Patient was recently admitted to the hospital and discharged on 1/23 with home hospice program due to severe untreatable medical conditions.  She is now admitted for multifocal pneumonia, acute on chronic respiratory failure, anemia due to GI bleeding, A-fib with RVR, and AKI on CKD 4. PMT has been consulted to assist with goals of care conversation.  Assessment: Goals of care conversation End-of-life care A-fib with RVR Multifocal pneumonia AKI on CKD stage IV Symptomatic anemia  Recommendations/Plan: Continue DNR/DNI Continue comfort care per [facility], no adjustments required today Patient's daughter is agreeable to transition to residential hospice today, [facility] has a bed and will accept Psychosocial and emotional support provided PMT will continue to follow and support as needed  Prognosis:  < 2 weeks  Discharge Planning: Hospice facility  Care plan was discussed with patient, patient's daughter, TOC, Dr. Sloan Leiter, [facility] hospital liaison, RN, chaplain Sallyanne Kuster    MDM [place]         [place], PA-C  Palliative Medicine Team Team phone # [phone]  Thank you for allowing the Palliative Medicine Team to assist in the care of  this patient. Please utilize secure chat with additional questions, if there is no response within 30 minutes please call the above phone number.  Palliative Medicine Team providers are available by phone from 7am to 7pm daily and can be reached through the team cell phone.  Should this patient require assistance outside of these hours, please call the patient's attending physician.

## 2022-12-30 NOTE — Progress Notes (Signed)
PROGRESS NOTE    Jillian [name]  [id number] DOB: Nov 14, 1936 DOA: 12/29/2022 PCP: [name], MD    Brief Narrative:  87 year old with CKD stage IV, chronic diastolic heart failure, history of stroke, hypertension, paroxysmal A-fib on Eliquis who was recently admitted to the hospital and discharged on 1/23 with home hospice program due to severe untreatable medical conditions.  She was admitted to [facility] care hospice at home.  She does have adequate support system at home.  Family found that she was more tired and having trouble breathing for last few days so brought back to the emergency room.  In the emergency room she is on 4 L oxygen.  A-fib with RVR heart rate 135.  CT scan of the chest with multifocal consolidations, multiple hypoattenuation lesions on her liver.  Creatinine 3.63, hemoglobin 7.3, FOBT positive.  She has indwelling Foley catheter that was recently placed at home.  Reportedly she was just treated for UTI.  Patient was started on 1 unit of PRBC, she was started on vancomycin, Rocephin and azithromycin for multifocal pneumonia and admitted to the hospital. 2/15, overnight with increasing oxygen demand and more distress.  See goal of care discussion below.   Assessment & Plan:   Acute hypoxemic respiratory failure and acute respiratory distress due to multiple factors including Multifocal pneumonia and fluid overload secondary to renal disease. Treated with vancomycin, Rocephin and azithromycin.  Continue Rocephin today's dose. Oxygen to keep saturation more than 90%, focus on comfort.  See goal of care discussion below.  Multiple liver lesions most likely metastatic disease, she is not a candidate for biopsy or contrasted studies.  A-fib with RVR: Difficulty to control rate.  Continue amiodarone maintenance dose.  Continue Eliquis as much as she can take it.  Also on metoprolol.  Goal of care discussion: Multiple discussions with this provider, palliative care  team and hospice team with patient and family.  Mr. [name] is Marine scientist.  Patient currently remains with severe frailty and debility.  She remains with poor respiratory status and overnight needed nonrebreather.  Remains intermittently uncomfortable. 2/15, called and updated about patient's worsening respiratory status.  Family wishes patient to go to inpatient hospice. Shared decision making was done, changing her to comfort care and hospice level of care today.  Will start all comfort care medications including low-dose of Dilaudid, Ativan as needed, oxygen, relief of his stress and anxiety.  Hospice team updated.  Patient does qualify for inpatient hospice as she is at her end-of-life. RN to pronounce death if happens in the hospital. Unrestricted visitor policy. Patient wishes to go home, however may likely need to go to inpatient hospice due to severity of clinical situation.   DVT prophylaxis: SCDs Start: 12/29/22 0431   Code Status: Comfort care Family Communication: Daughter on the phone Disposition Plan: Status is: Inpatient Remains inpatient appropriate because: Critically ill     Consultants:  Palliative Hospice  Procedures:  None  Antimicrobials:  Vancomycin, Rocephin azithromycin 2/13----   Subjective: Patient was seen and examined.  Overnight events noted.  Patient tells me she had a rough night and had a hard time catching her breath.  At this moment, she feels okay, nose is dry and she is not able to blow her nose. I talked to her about things not working in her favor and she is getting worse day by day, patient tells me "oh God, I would like to go home"  Objective: Vitals:   12/30/22 0605 12/30/22 0630 12/30/22 0800  12/30/22 1053  BP: 136/71 122/84  117/66  Pulse: (!) 114 (!) 115 (!) 149 (!) 114  Resp: (!) 28 (!) 22 (!) 33 (!) 23  Temp:    98.3 F (36.8 C)  TempSrc:    Oral  SpO2: (!) 84% 100%  97%  Weight:      Height:        Intake/Output  Summary (Last 24 hours) at 12/30/2022 1118 Last data filed at 12/30/2022 1043 Gross per 24 hour  Intake 329.15 ml  Output 1200 ml  Net -870.85 ml   Filed Weights   12/29/22 1500  Weight: 84.4 kg    Examination:  General exam: Appears anxious.  Sick looking.  Tremulous. Respiratory system: Very poor bilateral air entry.  In minimal respite distress at rest.  Able to communicate in complete sentences. SpO2: 97 % O2 Flow Rate (L/min): 4 L/min FiO2 (%): 100 %  Cardiovascular system: S1 & S2 heard, irregularly irregular.  Tachycardic. Gastrointestinal system: Soft. Central nervous system: Alert and awake.  Oriented x 2-3.  Flat affect.  Appropriately anxious.  Moves all extremities.      Data Reviewed: I have personally reviewed following labs and imaging studies  CBC: Recent Labs  Lab 12/29/22 0106 12/29/22 0131 12/29/22 0227 12/29/22 0251 12/30/22 0024  WBC 6.5  --   --   --  5.7  NEUTROABS 4.6  --   --   --   --   HGB 7.3* 8.2* 7.1* 7.1* 8.4*  HCT 23.5* 24.0* 21.0* 22.9* 26.6*  MCV 89.0  --   --   --  86.6  PLT 226  --   --   --  123456   Basic Metabolic Panel: Recent Labs  Lab 12/29/22 0106 12/29/22 0131 12/29/22 0227 12/30/22 0024  NA 137 140 140 138  K 4.4 4.4 4.4 4.6  CL 101  --   --  103  CO2 25  --   --  23  GLUCOSE 104*  --   --  82  BUN 47*  --   --  48*  CREATININE 3.63*  --   --  3.52*  CALCIUM 8.6*  --   --  8.3*  MG 2.0  --   --  2.1   GFR: Estimated Creatinine Clearance: 11.7 mL/min (A) (by C-G formula based on SCr of 3.52 mg/dL (H)). Liver Function Tests: Recent Labs  Lab 12/29/22 0106  AST 18  ALT 15  ALKPHOS 124  BILITOT 0.8  PROT 6.8  ALBUMIN 2.7*   No results for input(s): "LIPASE", "AMYLASE" in the last 168 hours. No results for input(s): "AMMONIA" in the last 168 hours. Coagulation Profile: No results for input(s): "INR", "PROTIME" in the last 168 hours. Cardiac Enzymes: No results for input(s): "CKTOTAL", "CKMB", "CKMBINDEX",  "TROPONINI" in the last 168 hours. BNP (last 3 results) No results for input(s): "PROBNP" in the last 8760 hours. HbA1C: No results for input(s): "HGBA1C" in the last 72 hours. CBG: No results for input(s): "GLUCAP" in the last 168 hours. Lipid Profile: No results for input(s): "CHOL", "HDL", "LDLCALC", "TRIG", "CHOLHDL", "LDLDIRECT" in the last 72 hours. Thyroid Function Tests: No results for input(s): "TSH", "T4TOTAL", "FREET4", "T3FREE", "THYROIDAB" in the last 72 hours. Anemia Panel: No results for input(s): "VITAMINB12", "FOLATE", "FERRITIN", "TIBC", "IRON", "RETICCTPCT" in the last 72 hours. Sepsis Labs: Recent Labs  Lab 12/29/22 0106 12/30/22 0024  PROCALCITON 0.72 0.79  LATICACIDVEN 1.1  --     Recent Results (from  the past 240 hour(s))  Resp panel by RT-PCR (RSV, Flu A&B, Covid) Anterior Nasal Swab     Status: None   Collection Time: 12/29/22  2:33 AM   Specimen: Anterior Nasal Swab  Result Value Ref Range Status   SARS Coronavirus 2 by RT PCR NEGATIVE NEGATIVE Final   Influenza A by PCR NEGATIVE NEGATIVE Final   Influenza B by PCR NEGATIVE NEGATIVE Final    Comment: (NOTE) The Xpert Xpress SARS-CoV-2/FLU/RSV plus assay is intended as an aid in the diagnosis of influenza from Nasopharyngeal swab specimens and should not be used as a sole basis for treatment. Nasal washings and aspirates are unacceptable for Xpert Xpress SARS-CoV-2/FLU/RSV testing.  Fact Sheet for Patients: EntrepreneurPulse.com.au  Fact Sheet for Healthcare Providers: IncredibleEmployment.be  This test is not yet approved or cleared by the [place] FDA and has been authorized for detection and/or diagnosis of SARS-CoV-2 by FDA under an Emergency Use Authorization (EUA). This EUA will remain in effect (meaning this test can be used) for the duration of the COVID-19 declaration under Section 564(b)(1) of the Act, 21 U.S.C. section 360bbb-3(b)(1), unless  the authorization is terminated or revoked.     Resp Syncytial Virus by PCR NEGATIVE NEGATIVE Final    Comment: (NOTE) Fact Sheet for Patients: EntrepreneurPulse.com.au  Fact Sheet for Healthcare Providers: IncredibleEmployment.be  This test is not yet approved or cleared by the [place] FDA and has been authorized for detection and/or diagnosis of SARS-CoV-2 by FDA under an Emergency Use Authorization (EUA). This EUA will remain in effect (meaning this test can be used) for the duration of the COVID-19 declaration under Section 564(b)(1) of the Act, 21 U.S.C. section 360bbb-3(b)(1), unless the authorization is terminated or revoked.  Performed at [facility], [place] [address], [place], [place] [address]   Blood culture (routine x 2)     Status: None (Preliminary result)   Collection Time: 12/29/22  4:06 AM   Specimen: BLOOD RIGHT FOREARM  Result Value Ref Range Status   Specimen Description BLOOD RIGHT FOREARM  Final   Special Requests   Final    BOTTLES DRAWN AEROBIC AND ANAEROBIC Blood Culture adequate volume   Culture   Final    NO GROWTH 1 DAY Performed at [facility], [place] [address] [place], [place] [address]    Report Status PENDING  Incomplete         Radiology Studies: DG CHEST PORT 1 VIEW  Result Date: 12/30/2022 CLINICAL DATA:  Shortness of breath and respiratory distress. EXAM: PORTABLE CHEST 1 VIEW COMPARISON:  12/29/2022 FINDINGS: Unchanged cardiac enlargement low lung volumes. Bilateral upper and lower lobe airspace opacities are again noted and appear similar to the previous exam. Visualized osseous structures are unremarkable. IMPRESSION: 1. No change in bilateral upper and lower lobe airspace opacities compatible with multifocal pneumonia. 2. Low lung volumes. Electronically Signed   By: Kerby Moors M.D.   On: 12/30/2022 05:35   CT Chest Wo Contrast  Result Date: 12/29/2022 CLINICAL DATA:   Respiratory illness, nondiagnostic xray. Hospice patient. EXAM: CT CHEST WITHOUT CONTRAST TECHNIQUE: Multidetector CT imaging of the chest was performed following the standard protocol without IV contrast. RADIATION DOSE REDUCTION: This exam was performed according to the departmental dose-optimization program which includes automated exposure control, adjustment of the mA and/or kV according to patient size and/or use of iterative reconstruction technique. COMPARISON:  None Available. FINDINGS: Cardiovascular: Mild coronary artery calcification. Global cardiac size within normal limits. Trace pericardial  effusion. Central pulmonary arteries are enlarged in keeping with changes of pulmonary arterial hypertension. Mild atherosclerotic calcification within the thoracic aorta. No aortic aneurysm. Mediastinum/Nodes: 2.2 cm left thyroid nodule. In the setting of significant comorbidities or limited life expectancy, no follow-up recommended (ref: J Am Coll Radiol. 2015 Feb;12(2): 143-50).No pathologic thoracic adenopathy. Esophagus unremarkable. Lungs/Pleura: There is multifocal consolidation throughout the lungs, more severe within the upper lobes bilaterally most in keeping with multifocal infection in the acute setting. No pneumothorax. Small left pleural effusion with associated left basilar atelectasis. No central obstructing lesion. Upper Abdomen: Innumerable hypodense nodules are seen throughout the liver, not well characterized on this examination, but suspicious for metastatic disease. No acute abnormality. Musculoskeletal: No acute bone abnormality. No lytic or blastic bone lesion. IMPRESSION: 1. Multifocal consolidation throughout the lungs, more severe within the upper lobes bilaterally, most in keeping with multifocal infection in the acute setting. 2. Small left pleural effusion with associated left basilar atelectasis. 3. Mild coronary artery calcification. 4. Morphologic changes in keeping with pulmonary  arterial hypertension. 5. Innumerable hypodense nodules throughout the liver, not well characterized on this examination, but suspicious for metastatic disease. Correlation with history of primary malignancy is recommended. If indicated, dedicated contrast enhanced CT examination of the abdomen and pelvis would be helpful for further evaluation. Aortic Atherosclerosis (ICD10-I70.0). Electronically Signed   By: Fidela Salisbury M.D.   On: 12/29/2022 03:22   DG Chest Portable 1 View  Result Date: 12/29/2022 CLINICAL DATA:  Altered mental status EXAM: PORTABLE CHEST 1 VIEW COMPARISON:  12/05/2022 FINDINGS: Shallow inspiration. Cardiac enlargement. Perihilar infiltration similar to prior study with probable small pleural effusions. Increasing infiltration in the right upper lung. Changes most likely to represent perihilar edema but developing superimposed pneumonia could also be present. No pneumothorax. Mediastinal contours appear intact. Calcification of the aorta. IMPRESSION: Persistent cardiac enlargement with some perihilar infiltration or edema and small pleural effusions similar to prior study. Increasing airspace disease in the right upper lung may represent superimposed pneumonia. Electronically Signed   By: Lucienne Capers M.D.   On: 12/29/2022 01:28        Scheduled Meds:  amiodarone  200 mg Oral Daily   metoprolol tartrate  50 mg Oral BID   sodium chloride flush  3 mL Intravenous Q12H   Continuous Infusions:  cefTRIAXone (ROCEPHIN)  IV 2 g (12/30/22 1043)     LOS: 1 day    Time spent: 50 minutes    Barb Merino, MD [facility] Pager [phone]

## 2022-12-30 NOTE — Progress Notes (Signed)
Report has been called to [facility], Leaving IV's and Chronic foley catheter in place, Care link is transferring pt. VSS, Education and Care plan Complete.   Alvis Lemmings, RN 12/30/2022 12:37 PM

## 2022-12-30 NOTE — Progress Notes (Addendum)
Overnight event  Notified by RN that the patient is in Afib with RVR (heart rate in the 130s), respiratory rate 20-24, oxygen saturation 96% on 3L, blood pressure 140/85.  Lungs sound wet on auscultation and wheezing. Patient complained of shortness of breath and was given Xopenex neb treatment. No chest pain.  Chart reviewed.  In brief 87 year old female with past medical history of CKD stage IV, chronic diastolic CHF, stroke, hypertension, paroxysmal A-fib on Eliquis admitted yesterday with altered mental status and cough.  She was recently admitted to the hospital and discharged on 1/23 with home hospice due to severe untreatable medical conditions.  She is now admitted for multifocal pneumonia, acute on chronic respiratory failure, anemia due to GI bleeding, A-fib with RVR, AKI on CKD stage IV, and innumerable liver nodules noted incidentally on CT chest concerning for possible metastatic disease..  Palliative care consulted and she is DNR.  Patient was given 1 unit PRBCs and is being treated with vancomycin, ceftriaxone, and azithromycin for multifocal pneumonia.  She is on metoprolol 50 mg twice daily and amiodarone 200 mg daily. Not receiving IV fluids.   Creatinine 3.5 on labs this morning, up from 3.1 on 1/24 and 2.2 in November.  Echo done 11/29/2022 showing EF 55%, moderate MVR.  Stat EKG done and showing A-fib with RVR, PVCs.  T wave inversions in lateral leads seen on prior tracing as well.   Stat chest x-ray done and showing no change in bilateral upper and lower lobe airspace opacities compatible with multifocal pneumonia.  Heart rate now improved to 120s. BP stable.   -Continue antibiotics for pneumonia -Continue Xopenex as needed -IV metoprolol 5 mg x1 and monitor closely -Continue scheduled oral metoprolol and amiodarone -BNP pending, give Lasix if BNP elevated and monitor renal function closely  Addendum/update 12/30/2022 at 6:25 am: Respiratory rate 28-32 and desatted to 84%  on 4L. Sats improved to 98-100% with NRB and her dyspnea improved. RN reporting crackles and bilateral lower extremity edema. BNP still pending. BP stable. IV Lasix 40 mg x1 ordered. Continue to monitor renal function.

## 2022-12-30 NOTE — Progress Notes (Signed)
This chaplain responded to MD consult for spiritual care in the context of major life transition and EOL.    The Pt. is awake and accepting of the chaplain's bedside presence. The chaplain observed the Pt. ability to navigate the work of breathing and talking to the chaplain at the same time. The Pt. shared with the medical team, as they visited, no pain at this time.  In the time together with the chaplain, the Pt. focus remained on breath prayers reflecting her faith, healing, and acceptance of eternal life. The Pt. shared her pastor prayed for her before she came to the hospital.   The Pt. and chaplain listened to the Pt. favorite hymn and prayed together.  This chaplain is available for F/U spiritual care as needed.  Chaplain Sallyanne Kuster [phone]

## 2022-12-30 NOTE — Care Plan (Signed)
Detail note to follow . Patient decompensated overnight . Discussed with Daughter [name] , starting comfort care and hospice pathway and refer to inpatient hospice at [facility].

## 2022-12-30 NOTE — Discharge Summary (Signed)
Physician Discharge Summary  Bridgitte Baselice [id number] DOB: [birth year] DOA: 12/29/2022  PCP: [name], MD  Admit date: 12/29/2022 Discharge date: 12/30/2022  Admitted From: Home  Disposition:  Inpatient hospice  Recommendations for Outpatient Follow-up: As per hospice provider  Discharge Condition:Serious  CODE STATUS:comfort care  Diet recommendation: as tolerated , regular diet   Discharge Summary: 87 year old with CKD stage IV, chronic diastolic heart failure, history of stroke, hypertension, paroxysmal A-fib on Eliquis who was recently admitted to the hospital and discharged on 1/23 with home hospice program due to severe untreatable medical conditions.  She was admitted to [facility] care hospice at home.  She does have adequate support system at home.  Family found that she was more tired and having trouble breathing for last few days so brought back to the emergency room.  In the emergency room she is on 4 L oxygen.  A-fib with RVR heart rate 135.  CT scan of the chest with multifocal consolidations, multiple hypoattenuation lesions on her liver.  Creatinine 3.63, hemoglobin 7.3, FOBT positive.  She has indwelling Foley catheter that was recently placed at home.  Reportedly she was just treated for UTI.  Patient was started on 1 unit of PRBC, she was started on vancomycin, Rocephin and azithromycin for multifocal pneumonia and admitted to the hospital.  2/15, overnight with increasing oxygen demand and more distress.  shared decision making, changed to comfort care.   Acute hypoxemic respiratory failure and acute respiratory distress due to multiple factors including Multifocal pneumonia and fluid overload secondary to renal disease. Multiple liver lesions most likely metastatic disease, she is not a candidate for biopsy or contrasted studies.  A-fib with RVR: Difficulty to control rate.  Continue amiodarone maintenance dose.  Continue Eliquis as much as she can take it.  Also on  metoprolol. CKD stage IV, worsening renal functions.  Goal of care: Patient currently remains with severe frailty and debility.  She remains with poor respiratory status and overnight needed nonrebreather.  Remains intermittently uncomfortable. Shared decision making was done, changing her to comfort care and hospice level of care.  Started on comfort care medications including low-dose of Dilaudid, Ativan as needed, oxygen, relief of stress and anxiety. Patient to transfer to inpatient hospice today to provide end-of-life care.  Medication management, Family desired to continue antibiotics.  She received 2 days of Rocephin and azithromycin.  Will continue Omnicef for additional 5 days as long as she can take it. She will continue to take amiodarone and metoprolol along with Eliquis until she can safely take by mouth.  Stable to transfer to hospice level of care.  Discharge Diagnoses:  Principal Problem:   Multifocal pneumonia Active Problems:   Acute renal failure superimposed on stage 4 chronic kidney disease (HCC)   Chronic diastolic heart failure (HCC)   History of CVA (cerebrovascular accident)   Benign essential hypertension   Paroxysmal atrial fibrillation with RVR (HCC)   Acute on chronic respiratory failure with hypoxia and hypercapnia (HCC)   Occult GI bleeding   Acute encephalopathy   Liver lesion    Discharge Instructions  Discharge Instructions     Diet general   Complete by: As directed    Increase activity slowly   Complete by: As directed    No wound care   Complete by: As directed       Allergies as of 12/30/2022       Reactions   Other Other (See Comments)   "Tapes tears off my  skin"   Latex Other (See Comments)   "Blisters my skin and tears it off"- skin burns, too   Penicillins Nausea Only   Tape Other (See Comments)   "Tapes tears off my skin"   Aspirin Other (See Comments)   Irritates the stomach   Atorvastatin Other (See Comments)    Caused fatigue   Diltiazem Hcl Nausea And Vomiting        Medication List     STOP taking these medications    amLODipine 5 MG tablet Commonly known as: NORVASC   clotrimazole-betamethasone cream Commonly known as: LOTRISONE   hydrALAZINE 50 MG tablet Commonly known as: APRESOLINE   HYDROcodone bit-homatropine 5-1.5 MG/5ML syrup Commonly known as: Hycodan       TAKE these medications    amiodarone 200 MG tablet Commonly known as: PACERONE Take 1 tablet (200 mg total) by mouth daily. What changed: Another medication with the same name was removed. Continue taking this medication, and follow the directions you see here.   apixaban 2.5 MG Tabs tablet Commonly known as: ELIQUIS Take 1 tablet (2.5 mg total) by mouth 2 (two) times daily.   cefdinir 300 MG capsule Commonly known as: OMNICEF Take 1 capsule (300 mg total) by mouth 2 (two) times daily for 5 days.   cholecalciferol 25 MCG (1000 UT) tablet Take 1,000 Units by mouth daily.   diphenhydrAMINE 25 mg capsule Commonly known as: BENADRYL Take 25 mg by mouth every 6 (six) hours as needed for itching or allergies.   furosemide 40 MG tablet Commonly known as: LASIX Take 1 tablet (40 mg total) by mouth daily.   levalbuterol 1.25 MG/0.5ML nebulizer solution Commonly known as: XOPENEX Take 1.25 mg by nebulization every 6 (six) hours as needed for wheezing or shortness of breath.   metoprolol tartrate 50 MG tablet Commonly known as: LOPRESSOR Take 1 tablet (50 mg total) by mouth 2 (two) times daily.   polyethylene glycol 17 g packet Commonly known as: MIRALAX / GLYCOLAX Take 17 g by mouth daily.   sennosides-docusate sodium 8.6-50 MG tablet Commonly known as: SENOKOT-S Take 3 tablets by mouth at bedtime. What changed:  when to take this reasons to take this   VITAMIN D PO Take 1 capsule by mouth every 7 (seven) days.        Allergies  Allergen Reactions   Other Other (See Comments)    "Tapes  tears off my skin"   Latex Other (See Comments)    "Blisters my skin and tears it off"- skin burns, too   Penicillins Nausea Only   Tape Other (See Comments)    "Tapes tears off my skin"   Aspirin Other (See Comments)    Irritates the stomach   Atorvastatin Other (See Comments)    Caused fatigue   Diltiazem Hcl Nausea And Vomiting    Consultations: Palliative care Hospice   Procedures/Studies: DG CHEST PORT 1 VIEW  Result Date: 12/30/2022 CLINICAL DATA:  Shortness of breath and respiratory distress. EXAM: PORTABLE CHEST 1 VIEW COMPARISON:  12/29/2022 FINDINGS: Unchanged cardiac enlargement low lung volumes. Bilateral upper and lower lobe airspace opacities are again noted and appear similar to the previous exam. Visualized osseous structures are unremarkable. IMPRESSION: 1. No change in bilateral upper and lower lobe airspace opacities compatible with multifocal pneumonia. 2. Low lung volumes. Electronically Signed   By: Kerby Moors M.D.   On: 12/30/2022 05:35   CT Chest Wo Contrast  Result Date: 12/29/2022 CLINICAL DATA:  Respiratory illness,  nondiagnostic xray. Hospice patient. EXAM: CT CHEST WITHOUT CONTRAST TECHNIQUE: Multidetector CT imaging of the chest was performed following the standard protocol without IV contrast. RADIATION DOSE REDUCTION: This exam was performed according to the departmental dose-optimization program which includes automated exposure control, adjustment of the mA and/or kV according to patient size and/or use of iterative reconstruction technique. COMPARISON:  None Available. FINDINGS: Cardiovascular: Mild coronary artery calcification. Global cardiac size within normal limits. Trace pericardial effusion. Central pulmonary arteries are enlarged in keeping with changes of pulmonary arterial hypertension. Mild atherosclerotic calcification within the thoracic aorta. No aortic aneurysm. Mediastinum/Nodes: 2.2 cm left thyroid nodule. In the setting of significant  comorbidities or limited life expectancy, no follow-up recommended (ref: J Am Coll Radiol. 2015 Feb;12(2): 143-50).No pathologic thoracic adenopathy. Esophagus unremarkable. Lungs/Pleura: There is multifocal consolidation throughout the lungs, more severe within the upper lobes bilaterally most in keeping with multifocal infection in the acute setting. No pneumothorax. Small left pleural effusion with associated left basilar atelectasis. No central obstructing lesion. Upper Abdomen: Innumerable hypodense nodules are seen throughout the liver, not well characterized on this examination, but suspicious for metastatic disease. No acute abnormality. Musculoskeletal: No acute bone abnormality. No lytic or blastic bone lesion. IMPRESSION: 1. Multifocal consolidation throughout the lungs, more severe within the upper lobes bilaterally, most in keeping with multifocal infection in the acute setting. 2. Small left pleural effusion with associated left basilar atelectasis. 3. Mild coronary artery calcification. 4. Morphologic changes in keeping with pulmonary arterial hypertension. 5. Innumerable hypodense nodules throughout the liver, not well characterized on this examination, but suspicious for metastatic disease. Correlation with history of primary malignancy is recommended. If indicated, dedicated contrast enhanced CT examination of the abdomen and pelvis would be helpful for further evaluation. Aortic Atherosclerosis (ICD10-I70.0). Electronically Signed   By: Fidela Salisbury M.D.   On: 12/29/2022 03:22   DG Chest Portable 1 View  Result Date: 12/29/2022 CLINICAL DATA:  Altered mental status EXAM: PORTABLE CHEST 1 VIEW COMPARISON:  12/05/2022 FINDINGS: Shallow inspiration. Cardiac enlargement. Perihilar infiltration similar to prior study with probable small pleural effusions. Increasing infiltration in the right upper lung. Changes most likely to represent perihilar edema but developing superimposed pneumonia could  also be present. No pneumothorax. Mediastinal contours appear intact. Calcification of the aorta. IMPRESSION: Persistent cardiac enlargement with some perihilar infiltration or edema and small pleural effusions similar to prior study. Increasing airspace disease in the right upper lung may represent superimposed pneumonia. Electronically Signed   By: Lucienne Capers M.D.   On: 12/29/2022 01:28   DG CHEST PORT 1 VIEW  Result Date: 12/05/2022 CLINICAL DATA:  Shortness of breath EXAM: PORTABLE CHEST 1 VIEW COMPARISON:  Chest radiograph 12/02/2022 FINDINGS: Monitoring leads overlie the patient. Stable cardiomegaly. Tortuosity of the thoracic aorta. Probable small bilateral layering pleural effusions. Interval increase in airspace opacities throughout the right lung. Similar appearance of the left lung. No pneumothorax. IMPRESSION: 1. Interval increase in airspace opacities throughout the right lung which may represent edema or infection. 2. Small bilateral pleural effusions. Electronically Signed   By: Lovey Newcomer M.D.   On: 12/05/2022 11:31   DG Chest Port 1 View  Result Date: 12/02/2022 CLINICAL DATA:  Acute respiratory failure EXAM: PORTABLE CHEST 1 VIEW COMPARISON:  Chest x-ray dated November 30, 2022 FINDINGS: Unchanged cardiomegaly. Mild diffuse interstitial opacities, increased when compared with the prior exam. Small bilateral pleural effusions, similar to prior. No evidence of pneumothorax. IMPRESSION: Mild diffuse interstitial opacities, increased when compared with the  prior exam, likely due to worsened pulmonary edema. Electronically Signed   By: Yetta Glassman M.D.   On: 12/02/2022 08:15   (Echo, Carotid, EGD, Colonoscopy, ERCP)    Subjective: Patient seen and examined in the morning rounds.  Overnight events noted.  See progress note.  She had respiratory decompensation and multiple interventions last night.  At one time she had to be on nonrebreather.  She feels her nose is dry and unable to  blow.   Discharge Exam: Vitals:   12/30/22 0800 12/30/22 1053  BP:  117/66  Pulse: (!) 149 (!) 114  Resp: (!) 33 (!) 23  Temp:  98.3 F (36.8 C)  SpO2:  97%   Vitals:   12/30/22 0605 12/30/22 0630 12/30/22 0800 12/30/22 1053  BP: 136/71 122/84  117/66  Pulse: (!) 114 (!) 115 (!) 149 (!) 114  Resp: (!) 28 (!) 22 (!) 33 (!) 23  Temp:    98.3 F (36.8 C)  TempSrc:    Oral  SpO2: (!) 84% 100%  97%  Weight:      Height:        General exam: Appears anxious.  Sick looking.  Tremulous. Respiratory system: Very poor bilateral air entry.  In minimal respite distress at rest.  Able to communicate in complete sentences. SpO2: 97 % O2 Flow Rate (L/min): 4 L/min FiO2 (%): 100 %  Cardiovascular system: S1 & S2 heard, irregularly irregular.  Tachycardic. Gastrointestinal system: Soft. Central nervous system: Alert and awake.  Oriented x 2-3.  Flat affect.  Appropriately anxious.  Moves all extremities.    The results of significant diagnostics from this hospitalization (including imaging, microbiology, ancillary and laboratory) are listed below for reference.     Microbiology: Recent Results (from the past 240 hour(s))  Resp panel by RT-PCR (RSV, Flu A&B, Covid) Anterior Nasal Swab     Status: None   Collection Time: 12/29/22  2:33 AM   Specimen: Anterior Nasal Swab  Result Value Ref Range Status   SARS Coronavirus 2 by RT PCR NEGATIVE NEGATIVE Final   Influenza A by PCR NEGATIVE NEGATIVE Final   Influenza B by PCR NEGATIVE NEGATIVE Final    Comment: (NOTE) The Xpert Xpress SARS-CoV-2/FLU/RSV plus assay is intended as an aid in the diagnosis of influenza from Nasopharyngeal swab specimens and should not be used as a sole basis for treatment. Nasal washings and aspirates are unacceptable for Xpert Xpress SARS-CoV-2/FLU/RSV testing.  Fact Sheet for Patients: EntrepreneurPulse.com.au  Fact Sheet for Healthcare  Providers: IncredibleEmployment.be  This test is not yet approved or cleared by the [place] FDA and has been authorized for detection and/or diagnosis of SARS-CoV-2 by FDA under an Emergency Use Authorization (EUA). This EUA will remain in effect (meaning this test can be used) for the duration of the COVID-19 declaration under Section 564(b)(1) of the Act, 21 U.S.C. section 360bbb-3(b)(1), unless the authorization is terminated or revoked.     Resp Syncytial Virus by PCR NEGATIVE NEGATIVE Final    Comment: (NOTE) Fact Sheet for Patients: EntrepreneurPulse.com.au  Fact Sheet for Healthcare Providers: IncredibleEmployment.be  This test is not yet approved or cleared by the [place] FDA and has been authorized for detection and/or diagnosis of SARS-CoV-2 by FDA under an Emergency Use Authorization (EUA). This EUA will remain in effect (meaning this test can be used) for the duration of the COVID-19 declaration under Section 564(b)(1) of the Act, 21 U.S.C. section 360bbb-3(b)(1), unless the authorization is terminated or revoked.  Performed at [facility], [place] [address], [place], [place] [address]   Blood culture (routine x 2)     Status: None (Preliminary result)   Collection Time: 12/29/22  4:06 AM   Specimen: BLOOD RIGHT FOREARM  Result Value Ref Range Status   Specimen Description BLOOD RIGHT FOREARM  Final   Special Requests   Final    BOTTLES DRAWN AEROBIC AND ANAEROBIC Blood Culture adequate volume   Culture   Final    NO GROWTH 1 DAY Performed at [facility], [place] [address], [place],  [address]    Report Status PENDING  Incomplete     Labs: BNP (last 3 results) Recent Labs    02/23/22 0219 11/28/22 1928 12/30/22 0024  BNP 468.4* 1,220.0* 123XX123*   Basic Metabolic Panel: Recent Labs  Lab 12/29/22 0106 12/29/22 0131 12/29/22 0227 12/30/22 0024  NA 137 140 140 138  K  4.4 4.4 4.4 4.6  CL 101  --   --  103  CO2 25  --   --  23  GLUCOSE 104*  --   --  82  BUN 47*  --   --  48*  CREATININE 3.63*  --   --  3.52*  CALCIUM 8.6*  --   --  8.3*  MG 2.0  --   --  2.1   Liver Function Tests: Recent Labs  Lab 12/29/22 0106  AST 18  ALT 15  ALKPHOS 124  BILITOT 0.8  PROT 6.8  ALBUMIN 2.7*   No results for input(s): "LIPASE", "AMYLASE" in the last 168 hours. No results for input(s): "AMMONIA" in the last 168 hours. CBC: Recent Labs  Lab 12/29/22 0106 12/29/22 0131 12/29/22 0227 12/29/22 0251 12/30/22 0024  WBC 6.5  --   --   --  5.7  NEUTROABS 4.6  --   --   --   --   HGB 7.3* 8.2* 7.1* 7.1* 8.4*  HCT 23.5* 24.0* 21.0* 22.9* 26.6*  MCV 89.0  --   --   --  86.6  PLT 226  --   --   --  225   Cardiac Enzymes: No results for input(s): "CKTOTAL", "CKMB", "CKMBINDEX", "TROPONINI" in the last 168 hours. BNP: Invalid input(s): "POCBNP" CBG: No results for input(s): "GLUCAP" in the last 168 hours. D-Dimer No results for input(s): "DDIMER" in the last 72 hours. Hgb A1c No results for input(s): "HGBA1C" in the last 72 hours. Lipid Profile No results for input(s): "CHOL", "HDL", "LDLCALC", "TRIG", "CHOLHDL", "LDLDIRECT" in the last 72 hours. Thyroid function studies No results for input(s): "TSH", "T4TOTAL", "T3FREE", "THYROIDAB" in the last 72 hours.  Invalid input(s): "FREET3" Anemia work up No results for input(s): "VITAMINB12", "FOLATE", "FERRITIN", "TIBC", "IRON", "RETICCTPCT" in the last 72 hours. Urinalysis    Component Value Date/Time   COLORURINE YELLOW 12/29/2022 0123   APPEARANCEUR HAZY (A) 12/29/2022 0123   LABSPEC 1.012 12/29/2022 0123   PHURINE 5.0 12/29/2022 0123   GLUCOSEU NEGATIVE 12/29/2022 0123   HGBUR LARGE (A) 12/29/2022 0123   BILIRUBINUR NEGATIVE 12/29/2022 0123   KETONESUR NEGATIVE 12/29/2022 0123   PROTEINUR 100 (A) 12/29/2022 0123   NITRITE NEGATIVE 12/29/2022 0123   LEUKOCYTESUR LARGE (A) 12/29/2022 0123    Sepsis Labs Recent Labs  Lab 12/29/22 0106 12/30/22 0024  WBC 6.5 5.7   Microbiology Recent Results (from the past 240 hour(s))  Resp panel by RT-PCR (RSV, Flu A&B, Covid) Anterior Nasal Swab  Status: None   Collection Time: 12/29/22  2:33 AM   Specimen: Anterior Nasal Swab  Result Value Ref Range Status   SARS Coronavirus 2 by RT PCR NEGATIVE NEGATIVE Final   Influenza A by PCR NEGATIVE NEGATIVE Final   Influenza B by PCR NEGATIVE NEGATIVE Final    Comment: (NOTE) The Xpert Xpress SARS-CoV-2/FLU/RSV plus assay is intended as an aid in the diagnosis of influenza from Nasopharyngeal swab specimens and should not be used as a sole basis for treatment. Nasal washings and aspirates are unacceptable for Xpert Xpress SARS-CoV-2/FLU/RSV testing.  Fact Sheet for Patients: EntrepreneurPulse.com.au  Fact Sheet for Healthcare Providers: IncredibleEmployment.be  This test is not yet approved or cleared by the [place] FDA and has been authorized for detection and/or diagnosis of SARS-CoV-2 by FDA under an Emergency Use Authorization (EUA). This EUA will remain in effect (meaning this test can be used) for the duration of the COVID-19 declaration under Section 564(b)(1) of the Act, 21 U.S.C. section 360bbb-3(b)(1), unless the authorization is terminated or revoked.     Resp Syncytial Virus by PCR NEGATIVE NEGATIVE Final    Comment: (NOTE) Fact Sheet for Patients: EntrepreneurPulse.com.au  Fact Sheet for Healthcare Providers: IncredibleEmployment.be  This test is not yet approved or cleared by the [place] FDA and has been authorized for detection and/or diagnosis of SARS-CoV-2 by FDA under an Emergency Use Authorization (EUA). This EUA will remain in effect (meaning this test can be used) for the duration of the COVID-19 declaration under Section 564(b)(1) of the Act, 21 U.S.C. section  360bbb-3(b)(1), unless the authorization is terminated or revoked.  Performed at [facility], [place] [address] [place], [place] [address]   Blood culture (routine x 2)     Status: None (Preliminary result)   Collection Time: 12/29/22  4:06 AM   Specimen: BLOOD RIGHT FOREARM  Result Value Ref Range Status   Specimen Description BLOOD RIGHT FOREARM  Final   Special Requests   Final    BOTTLES DRAWN AEROBIC AND ANAEROBIC Blood Culture adequate volume   Culture   Final    NO GROWTH 1 DAY Performed at [facility], [place] [address] [place], [place] [address]    Report Status PENDING  Incomplete     Time coordinating discharge:  35 minutes  SIGNED:   Barb Merino, MD  [facility] 12/30/2022, 12:34 PM

## 2022-12-30 NOTE — Progress Notes (Signed)
At approximately 0400 patient complained of increase SOB and wheezing.  Upon assessment wheezing was audible and patient had increase in respirations and appeared SOB.  Respiratory therapist called and Xopenex breathing treatment was given as ordered with no improvement.  Dr. Marlowe Sax notified of the above via text page.  New orders given and carried out with minimal improvement.  At 0600 patient oxygen saturation dropped to 85% on 4L [place] and respirations low 30's and patient was placed on 100% NRB.  Dr. Marlowe Sax notified again of the change in condition and new orders given and carried out.  Patient in bed at this time with 100% NRB in place and reports she feels better after administration of lasix as ordered.  Wheezing has decreased and respirations are now 28 with oxygen saturations 100% on NRB.  See Dr. Elon Jester note, flowsheets, and [facility] for further details.

## 2022-12-30 NOTE — Progress Notes (Signed)
[place] [facility]) Hospital Liaison Note  Spoke with patient's daughter today about bring the patient to [facility] place to manage her respiratory symptoms. [name] is agreeable to transfer today.   Hospital staff aware.  RN, please call report to [phone] prior to patient leaving the unit. Please send signed and completed DNR with patient at discharge.   Thank you,  Zigmund Gottron RN  [facility] Liaison [phone]

## 2022-12-31 LAB — LEGIONELLA PNEUMOPHILA SEROGP 1 UR AG: L. pneumophila Serogp 1 Ur Ag: NEGATIVE

## 2023-01-03 LAB — CULTURE, BLOOD (ROUTINE X 2)
Culture: NO GROWTH
Special Requests: ADEQUATE

## 2023-01-12 ENCOUNTER — Emergency Department (HOSPITAL_COMMUNITY)

## 2023-01-12 ENCOUNTER — Emergency Department (HOSPITAL_COMMUNITY)
Admission: EM | Admit: 2023-01-12 | Discharge: 2023-01-12 | Disposition: A | Discharge status: Home / Self Care | Attending: Emergency Medicine | Admitting: Emergency Medicine

## 2023-01-12 DIAGNOSIS — R009 Unspecified abnormalities of heart beat: Secondary | ICD-10-CM | POA: Diagnosis not present

## 2023-01-12 DIAGNOSIS — Z91040 Latex allergy status: Secondary | ICD-10-CM | POA: Insufficient documentation

## 2023-01-12 DIAGNOSIS — W19XXXA Unspecified fall, initial encounter: Secondary | ICD-10-CM | POA: Diagnosis not present

## 2023-01-12 DIAGNOSIS — Z043 Encounter for examination and observation following other accident: Secondary | ICD-10-CM | POA: Diagnosis not present

## 2023-01-12 DIAGNOSIS — Z7901 Long term (current) use of anticoagulants: Secondary | ICD-10-CM | POA: Insufficient documentation

## 2023-01-12 DIAGNOSIS — Z7401 Bed confinement status: Secondary | ICD-10-CM | POA: Diagnosis not present

## 2023-01-12 DIAGNOSIS — F0390 Unspecified dementia without behavioral disturbance: Secondary | ICD-10-CM | POA: Diagnosis not present

## 2023-01-12 DIAGNOSIS — R531 Weakness: Secondary | ICD-10-CM | POA: Diagnosis not present

## 2023-01-12 DIAGNOSIS — K8020 Calculus of gallbladder without cholecystitis without obstruction: Secondary | ICD-10-CM | POA: Diagnosis not present

## 2023-01-12 DIAGNOSIS — R6889 Other general symptoms and signs: Secondary | ICD-10-CM | POA: Diagnosis not present

## 2023-01-12 DIAGNOSIS — R945 Abnormal results of liver function studies: Secondary | ICD-10-CM | POA: Diagnosis not present

## 2023-01-12 DIAGNOSIS — I499 Cardiac arrhythmia, unspecified: Secondary | ICD-10-CM | POA: Diagnosis not present

## 2023-01-12 DIAGNOSIS — Z743 Need for continuous supervision: Secondary | ICD-10-CM | POA: Diagnosis not present

## 2023-01-12 DIAGNOSIS — S0990XA Unspecified injury of head, initial encounter: Secondary | ICD-10-CM | POA: Diagnosis not present

## 2023-01-12 DIAGNOSIS — R404 Transient alteration of awareness: Secondary | ICD-10-CM | POA: Diagnosis not present

## 2023-01-12 LAB — CBC WITH DIFFERENTIAL/PLATELET
Abs Immature Granulocytes: 0.13 10*3/uL — ABNORMAL HIGH (ref 0.00–0.07)
Basophils Absolute: 0 10*3/uL (ref 0.0–0.1)
Basophils Relative: 0 %
Eosinophils Absolute: 0 10*3/uL (ref 0.0–0.5)
Eosinophils Relative: 0 %
HCT: 32.8 % — ABNORMAL LOW (ref 36.0–46.0)
Hemoglobin: 9.6 g/dL — ABNORMAL LOW (ref 12.0–15.0)
Immature Granulocytes: 2 %
Lymphocytes Relative: 15 %
Lymphs Abs: 1.1 10*3/uL (ref 0.7–4.0)
MCH: 26.7 pg (ref 26.0–34.0)
MCHC: 29.3 g/dL — ABNORMAL LOW (ref 30.0–36.0)
MCV: 91.4 fL (ref 80.0–100.0)
Monocytes Absolute: 0.9 10*3/uL (ref 0.1–1.0)
Monocytes Relative: 12 %
Neutro Abs: 5.5 10*3/uL (ref 1.7–7.7)
Neutrophils Relative %: 71 %
Platelets: 123 10*3/uL — ABNORMAL LOW (ref 150–400)
RBC: 3.59 MIL/uL — ABNORMAL LOW (ref 3.87–5.11)
RDW: 16 % — ABNORMAL HIGH (ref 11.5–15.5)
WBC: 7.7 10*3/uL (ref 4.0–10.5)
nRBC: 0.5 % — ABNORMAL HIGH (ref 0.0–0.2)

## 2023-01-12 LAB — COMPREHENSIVE METABOLIC PANEL
ALT: 110 U/L — ABNORMAL HIGH (ref 0–44)
AST: 49 U/L — ABNORMAL HIGH (ref 15–41)
Albumin: 2.5 g/dL — ABNORMAL LOW (ref 3.5–5.0)
Alkaline Phosphatase: 153 U/L — ABNORMAL HIGH (ref 38–126)
Anion gap: 13 (ref 5–15)
BUN: 25 mg/dL — ABNORMAL HIGH (ref 8–23)
CO2: 27 mmol/L (ref 22–32)
Calcium: 8.7 mg/dL — ABNORMAL LOW (ref 8.9–10.3)
Chloride: 99 mmol/L (ref 98–111)
Creatinine, Ser: 2.44 mg/dL — ABNORMAL HIGH (ref 0.44–1.00)
GFR, Estimated: 19 mL/min — ABNORMAL LOW (ref >60)
Glucose, Bld: 86 mg/dL (ref 70–99)
Potassium: 3.2 mmol/L — ABNORMAL LOW (ref 3.5–5.1)
Sodium: 139 mmol/L (ref 135–145)
Total Bilirubin: 1.1 mg/dL (ref 0.3–1.2)
Total Protein: 6.7 g/dL (ref 6.5–8.1)

## 2023-01-12 LAB — MAGNESIUM: Magnesium: 1.7 mg/dL (ref 1.7–2.4)

## 2023-01-12 MED ORDER — AMIODARONE HCL 200 MG PO TABS
200.0000 mg | ORAL_TABLET | Freq: Every day | ORAL | Status: DC
  Administered 2023-01-12: 200 mg via ORAL
  Filled 2023-01-12: qty 1

## 2023-01-12 MED ORDER — APIXABAN 2.5 MG PO TABS
2.5000 mg | ORAL_TABLET | Freq: Two times a day (BID) | ORAL | Status: DC
  Administered 2023-01-12: 2.5 mg via ORAL
  Filled 2023-01-12: qty 1

## 2023-01-12 MED ORDER — POLYETHYLENE GLYCOL 3350 17 G PO PACK
17.0000 g | PACK | Freq: Every day | ORAL | Status: DC
  Administered 2023-01-12: 17 g via ORAL
  Filled 2023-01-12: qty 1

## 2023-01-12 MED ORDER — SENNOSIDES-DOCUSATE SODIUM 8.6-50 MG PO TABS: 3.0000 | ORAL_TABLET | Freq: Every evening | ORAL | Status: DC | PRN

## 2023-01-12 MED ORDER — DIPHENHYDRAMINE HCL 25 MG PO CAPS: 25.0000 mg | ORAL_CAPSULE | Freq: Four times a day (QID) | ORAL | Status: DC | PRN

## 2023-01-12 MED ORDER — LACTATED RINGERS IV BOLUS
500.0000 mL | Freq: Once | INTRAVENOUS | Status: AC
  Administered 2023-01-12: 500 mL via INTRAVENOUS

## 2023-01-12 MED ORDER — POTASSIUM CHLORIDE CRYS ER 20 MEQ PO TBCR: 40.0000 meq | EXTENDED_RELEASE_TABLET | Freq: Once | ORAL | Status: DC

## 2023-01-12 MED ORDER — VITAMIN D 25 MCG (1000 UNIT) PO TABS
1000.0000 [IU] | ORAL_TABLET | Freq: Every day | ORAL | Status: DC
  Administered 2023-01-12: 1000 [IU] via ORAL
  Filled 2023-01-12: qty 1

## 2023-01-12 NOTE — Progress Notes (Signed)
[employer]) Hospital Liaison Note   This is a current [facility] hospice patient. Please call with any hospice questions or concerns. Thank you.   Zigmund Gottron, RN [facility] Liaison [phone]

## 2023-01-12 NOTE — ED Provider Notes (Signed)
Patient seen by social worker was contacted family and they have agreed patient to return home and they will work on placement.  Will discharge patient   Lacretia Leigh, MD 01/12/23 1909

## 2023-01-12 NOTE — ED Notes (Addendum)
Pt brief cleaned, changed along with pad (purewick placed) and wound in the middle of buttocks noticed when rolling patient.  No new orders at this time.

## 2023-01-12 NOTE — Discharge Instructions (Signed)
Your ultrasound today shows some abnormality in your liver.  Follow-up with your doctor for this

## 2023-01-12 NOTE — ED Notes (Signed)
[name] (Daughter) called asking for an update on [place]. Her number is [phone].

## 2023-01-12 NOTE — ED Provider Notes (Signed)
Jillian [name] Provider Note   CSN: [id number] Arrival date & time: 01/12/23  [id number]     History  Chief Complaint  Patient presents with   Lytle Michaels    Jillian [name] is a 87 y.o. female.  87 year old female with medical problems include A-fib on Eliquis who presents ER today after 2 falls.  Patient was in hospice until few days ago was discharged as she thought she was doing better and the family wanted take care of her.  She fell yesterday morning around 0 800 and fell again last night at midnight and fell again just prior to arrival here.  Sounded the hospice nurse has been in contact with them.  Low concern for any traumatic injuries that she is not really complaining of pain or have any hematomas breathing anywhere.  Soundly the hospice nurses been in contact with the family to try to get her back into [facility], more to come this morning.  Patient is asymptomatic but also has a history of dementia. Per EMS, hypertensive and intermittently tachycardic en route with irregular rhythm.   Fall       Home Medications Prior to Admission medications   Medication Sig Start Date End Date Taking? Authorizing Provider  amiodarone (PACERONE) 200 MG tablet Take 1 tablet (200 mg total) by mouth daily. 12/14/22 01/13/23  Barb Merino, MD  apixaban (ELIQUIS) 2.5 MG TABS tablet Take 1 tablet (2.5 mg total) by mouth 2 (two) times daily. 11/11/22   Argentina Donovan, PA-C  cholecalciferol (VITAMIN D3) 25 MCG (1000 UNIT) tablet Take 1,000 Units by mouth daily. 10/15/22   [provider]  diphenhydrAMINE (BENADRYL) 25 mg capsule Take 25 mg by mouth every 6 (six) hours as needed for itching or allergies. 10/15/22   [provider]  furosemide (LASIX) 40 MG tablet Take 1 tablet (40 mg total) by mouth daily. 12/07/22 01/06/23  Barb Merino, MD  levalbuterol Penne Lash) 1.25 MG/0.5ML nebulizer solution Take 1.25 mg by nebulization every 6 (six) hours  as needed for wheezing or shortness of breath. 12/07/22 01/06/23  Barb Merino, MD  metoprolol tartrate (LOPRESSOR) 50 MG tablet Take 1 tablet (50 mg total) by mouth 2 (two) times daily. 12/07/22 01/06/23  Barb Merino, MD  polyethylene glycol (MIRALAX / GLYCOLAX) 17 g packet Take 17 g by mouth daily. 12/07/22   Barb Merino, MD  sennosides-docusate sodium (SENOKOT-S) 8.6-50 MG tablet Take 3 tablets by mouth at bedtime. Patient taking differently: Take 3 tablets by mouth at bedtime as needed for constipation. 10/01/21   [name], MD  VITAMIN D PO Take 1 capsule by mouth every 7 (seven) days.    [provider]      Allergies    Other, Latex, Penicillins, Tape, Aspirin, Atorvastatin, and Diltiazem hcl    Review of Systems   Review of Systems  Physical Exam Updated Vital Signs BP (!) 162/80   Temp 97.7 F (36.5 C) (Oral)   Resp 20   Ht '5\' 1"'$  (1.549 m)   Wt 83.9 kg   SpO2 100%   BMI 34.96 kg/m  Physical Exam Vitals and nursing note reviewed.  Constitutional:      Appearance: She is well-developed.  HENT:     Head: Normocephalic and atraumatic.  Eyes:     Pupils: Pupils are equal, round, and reactive to light.  Cardiovascular:     Rate and Rhythm: Normal rate. Rhythm irregular.  Pulmonary:     Effort: No  respiratory distress.     Breath sounds: No stridor.  Abdominal:     General: Abdomen is flat. There is no distension.  Musculoskeletal:        General: No swelling, tenderness or deformity.     Cervical back: Normal range of motion.     Comments: No cervical spine tenderness, thoracic spine tenderness or Lumbar spine tenderness.  No tenderness or pain with palpation and full ROM of all joints in upper and lower extremities.  No ecchymosis or other signs of trauma on back or extremities.  No Pain with AP or lateral compression of ribs.  No Paracervical ttp, paraspinal ttp   Skin:    General: Skin is warm and dry.  Neurological:     General: No focal  deficit present.     Mental Status: She is alert.     ED Results / Procedures / Treatments   Labs (all labs ordered are listed, but only abnormal results are displayed) Labs Reviewed - No data to display  EKG None  Radiology No results found.  Procedures Procedures    Medications Ordered in ED Medications - No data to display  ED Course/ Medical Decision Making/ A&P                             Medical Decision Making Amount and/or Complexity of Data Reviewed Labs: ordered. Radiology: ordered.  Will evaluate for causes of weakness but overall feel the patient likely just needs to go back to hospice..  Will get a pelvis and chest x-ray as well.  No signs of any head trauma and patient does not have any external evidence of trauma but will get a head CT just in case. Care transferred pendign completion of workup and disposition.    Final Clinical Impression(s) / ED Diagnoses Final diagnoses:  None    Rx / DC Orders ED Discharge Orders     None         Saleem Coccia, Corene Cornea, MD 01/12/23 [phone]

## 2023-01-12 NOTE — Progress Notes (Signed)
Orthopedic Tech Progress Note Patient Details:  Jillian [name] 01-Jun-1936 [id number]  Patient ID: [name], female   DOB: [birth year], 87 y.o.   MRN: [id number] I attended trauma page. Karolee Stamps 01/12/2023, 6:28 AM

## 2023-01-12 NOTE — Care Management (Signed)
ED RNCM  spoke with [facility] liaison Sarah RN who provided update on patient was discharged from [facility] yesterday due to no longer meeting criteria for the residential services. RNCM contacted  patient's daughter Lennie Muckle to discuss patient not meeting criteria for hospital admission, or  continued residential hospice services.  Daughter is verbalized understanding and has agreed to accept patient home and will collaborate with SW at [facility] to have patient placed.  Updated EDP and RN on plan.   Patient will be transported home via [place].  Will inform daughter when patient is transported.  Laurena Slimmer RN, BSN  ED Care Manager [phone]

## 2023-01-12 NOTE — ED Notes (Signed)
[facility] called, no ETA

## 2023-01-12 NOTE — ED Provider Notes (Signed)
  Physical Exam  BP (!) 162/80   Pulse 92   Temp 97.7 F (36.5 C) (Oral)   Resp 20   Ht 5' 1"$  (1.549 m)   Wt 83.9 kg   SpO2 100%   BMI 34.96 kg/m     Procedures  Procedures  ED Course / MDM    Medical Decision Making Amount and/or Complexity of Data Reviewed Labs: ordered. Radiology: ordered.  Risk OTC drugs. Prescription drug management.   87F, recently left hospice, frequent falls, family cannot care for her. Dementia, on Eliquis, imaging negative, labs pending, likely dispo back to hospice.  Patient laboratory evaluation significant for CBC without a leukocytosis, stable hemoglobin to 9.6, improved from previous measurements.  CMP with hypokalemia to 3.2, elevated serum creatinine to 2.44, downtrending from previous measurements 2 weeks ago.  LFTs elevated with an AST of 49, ALT of 110, T. bili normal. UA pending. In and out cath ordered.  Per recent DC summary: "87 year old with CKD stage IV, chronic diastolic heart failure, history of stroke, hypertension, paroxysmal A-fib on Eliquis who was recently admitted to the hospital and discharged on 1/23 with home hospice program due to severe untreatable medical conditions.  She was admitted to [facility] care hospice at home.  She does have adequate support system at home.  Family found that she was more tired and having trouble breathing for last few days so brought back to the emergency room.  In the emergency room she is on 4 L oxygen.  A-fib with RVR heart rate 135.  CT scan of the chest with multifocal consolidations, multiple hypoattenuation lesions on her liver.  Creatinine 3.63, hemoglobin 7.3, FOBT positive.  She has indwelling Foley catheter that was recently placed at home.  Reportedly she was just treated for UTI.  Patient was started on 1 unit of PRBC, she was started on vancomycin, Rocephin and azithromycin for multifocal pneumonia and admitted to the hospital."  RUQ [place]:  IMPRESSION:  1. Cholelithiasis without  evidence of acute cholecystitis.  2. Liver parenchymal echogenicity is increased diffusely  heterogeneous compatible with diffuse hepatocellular disease. Small  solid lesions would be difficult to exclude given heterogeneity. If  there is high clinical concern for focal lesion consider further  evaluation with MRI.  3. Two hypoechoic areas in the left lobe of the liver favored to  represent cysts.    Notified by lab that not enough urine sampled from in and out cath available. Nursing confirms lack of urine on cathed sample. Will administer a small fluid bolus, check a magnesium given the patient's hypokalemia, and re-attempt to collect a urine specimen.   Family is requesting that the patient be placed in [facility] residential hospice. Cannot manage home care at this time. TOC consult placed. Per hospice nursing, "She cannot return to our in patient unit. She does not meet the criteria there now. She was just discharged from there to home yesterday. I have already spoken with the hospice RN and SW, who agreed that we would need to look for LTC placement once she returned home. I explained this to her daughter as well."  UA pending at time of signout, pt will like become a TOC boarder pending placement in a long term care facility. Signout given to Dr. Zenia Resides at [phone].       Regan Lemming, MD 01/12/23 [phone]

## 2023-01-12 NOTE — ED Triage Notes (Signed)
Pt arrives via [facility] from home for fall on thinners. Pt has had three falls in the last 24 hours last fall around midnight. Pt is on hospice. A+Ox3 (missing situation). Hx of dementia

## 2023-01-14 ENCOUNTER — Encounter (HOSPITAL_COMMUNITY): Payer: Self-pay

## 2023-01-14 ENCOUNTER — Other Ambulatory Visit: Payer: Self-pay

## 2023-01-14 ENCOUNTER — Emergency Department (HOSPITAL_COMMUNITY)
Admission: EM | Admit: 2023-01-14 | Discharge: 2023-01-17 | Disposition: A | Discharge status: Home / Self Care | Attending: Emergency Medicine | Admitting: Emergency Medicine

## 2023-01-14 ENCOUNTER — Emergency Department (HOSPITAL_COMMUNITY)

## 2023-01-14 DIAGNOSIS — I499 Cardiac arrhythmia, unspecified: Secondary | ICD-10-CM | POA: Diagnosis not present

## 2023-01-14 DIAGNOSIS — I4891 Unspecified atrial fibrillation: Secondary | ICD-10-CM | POA: Insufficient documentation

## 2023-01-14 DIAGNOSIS — M79672 Pain in left foot: Secondary | ICD-10-CM | POA: Diagnosis not present

## 2023-01-14 DIAGNOSIS — W19XXXA Unspecified fall, initial encounter: Secondary | ICD-10-CM | POA: Insufficient documentation

## 2023-01-14 DIAGNOSIS — M79673 Pain in unspecified foot: Secondary | ICD-10-CM | POA: Diagnosis not present

## 2023-01-14 DIAGNOSIS — Z043 Encounter for examination and observation following other accident: Secondary | ICD-10-CM | POA: Diagnosis not present

## 2023-01-14 DIAGNOSIS — F0390 Unspecified dementia without behavioral disturbance: Secondary | ICD-10-CM | POA: Insufficient documentation

## 2023-01-14 DIAGNOSIS — R0689 Other abnormalities of breathing: Secondary | ICD-10-CM | POA: Insufficient documentation

## 2023-01-14 DIAGNOSIS — M79671 Pain in right foot: Secondary | ICD-10-CM | POA: Diagnosis not present

## 2023-01-14 DIAGNOSIS — Z91040 Latex allergy status: Secondary | ICD-10-CM | POA: Diagnosis not present

## 2023-01-14 DIAGNOSIS — S0990XA Unspecified injury of head, initial encounter: Secondary | ICD-10-CM | POA: Diagnosis not present

## 2023-01-14 DIAGNOSIS — Z743 Need for continuous supervision: Secondary | ICD-10-CM | POA: Diagnosis not present

## 2023-01-14 DIAGNOSIS — Z7901 Long term (current) use of anticoagulants: Secondary | ICD-10-CM | POA: Diagnosis not present

## 2023-01-14 DIAGNOSIS — R410 Disorientation, unspecified: Secondary | ICD-10-CM | POA: Insufficient documentation

## 2023-01-14 LAB — CBC WITH DIFFERENTIAL/PLATELET
Abs Immature Granulocytes: 0.08 10*3/uL — ABNORMAL HIGH (ref 0.00–0.07)
Basophils Absolute: 0 10*3/uL (ref 0.0–0.1)
Basophils Relative: 0 %
Eosinophils Absolute: 0 10*3/uL (ref 0.0–0.5)
Eosinophils Relative: 1 %
HCT: 32 % — ABNORMAL LOW (ref 36.0–46.0)
Hemoglobin: 9.7 g/dL — ABNORMAL LOW (ref 12.0–15.0)
Immature Granulocytes: 1 %
Lymphocytes Relative: 15 %
Lymphs Abs: 1.3 10*3/uL (ref 0.7–4.0)
MCH: 27.3 pg (ref 26.0–34.0)
MCHC: 30.3 g/dL (ref 30.0–36.0)
MCV: 90.1 fL (ref 80.0–100.0)
Monocytes Absolute: 1.4 10*3/uL — ABNORMAL HIGH (ref 0.1–1.0)
Monocytes Relative: 16 %
Neutro Abs: 5.8 10*3/uL (ref 1.7–7.7)
Neutrophils Relative %: 67 %
Platelets: 153 10*3/uL (ref 150–400)
RBC: 3.55 MIL/uL — ABNORMAL LOW (ref 3.87–5.11)
RDW: 16.8 % — ABNORMAL HIGH (ref 11.5–15.5)
WBC: 8.6 10*3/uL (ref 4.0–10.5)
nRBC: 0 % (ref 0.0–0.2)

## 2023-01-14 LAB — COMPREHENSIVE METABOLIC PANEL
ALT: 65 U/L — ABNORMAL HIGH (ref 0–44)
AST: 30 U/L (ref 15–41)
Albumin: 2.4 g/dL — ABNORMAL LOW (ref 3.5–5.0)
Alkaline Phosphatase: 173 U/L — ABNORMAL HIGH (ref 38–126)
Anion gap: 11 (ref 5–15)
BUN: 17 mg/dL (ref 8–23)
CO2: 31 mmol/L (ref 22–32)
Calcium: 8.5 mg/dL — ABNORMAL LOW (ref 8.9–10.3)
Chloride: 99 mmol/L (ref 98–111)
Creatinine, Ser: 2.1 mg/dL — ABNORMAL HIGH (ref 0.44–1.00)
GFR, Estimated: 23 mL/min — ABNORMAL LOW (ref >60)
Glucose, Bld: 145 mg/dL — ABNORMAL HIGH (ref 70–99)
Potassium: 3.1 mmol/L — ABNORMAL LOW (ref 3.5–5.1)
Sodium: 141 mmol/L (ref 135–145)
Total Bilirubin: 0.9 mg/dL (ref 0.3–1.2)
Total Protein: 7 g/dL (ref 6.5–8.1)

## 2023-01-14 LAB — URINALYSIS, ROUTINE W REFLEX MICROSCOPIC
Bilirubin Urine: NEGATIVE
Glucose, UA: NEGATIVE mg/dL
Hgb urine dipstick: NEGATIVE
Ketones, ur: NEGATIVE mg/dL
Nitrite: NEGATIVE
Protein, ur: 100 mg/dL — AB
Specific Gravity, Urine: 1.009 (ref 1.005–1.030)
pH: 6 (ref 5.0–8.0)

## 2023-01-14 LAB — BRAIN NATRIURETIC PEPTIDE: B Natriuretic Peptide: 1043.5 pg/mL — ABNORMAL HIGH (ref 0.0–100.0)

## 2023-01-14 LAB — TROPONIN I (HIGH SENSITIVITY)
Troponin I (High Sensitivity): 52 ng/L — ABNORMAL HIGH (ref <18)
Troponin I (High Sensitivity): 46 ng/L — ABNORMAL HIGH (ref <18)

## 2023-01-14 LAB — MAGNESIUM: Magnesium: 1.5 mg/dL — ABNORMAL LOW (ref 1.7–2.4)

## 2023-01-14 MED ORDER — METOPROLOL TARTRATE 25 MG PO TABS: 50.0000 mg | ORAL_TABLET | Freq: Two times a day (BID) | ORAL | Status: DC

## 2023-01-14 MED ORDER — APIXABAN 2.5 MG PO TABS
2.5000 mg | ORAL_TABLET | Freq: Two times a day (BID) | ORAL | Status: DC
  Administered 2023-01-14 – 2023-01-17 (×6): 2.5 mg via ORAL
  Filled 2023-01-14 (×8): qty 1

## 2023-01-14 MED ORDER — HYDRALAZINE HCL 25 MG PO TABS: 50.0000 mg | ORAL_TABLET | Freq: Three times a day (TID) | ORAL | Status: DC

## 2023-01-14 MED ORDER — METOPROLOL TARTRATE 25 MG PO TABS
50.0000 mg | ORAL_TABLET | Freq: Two times a day (BID) | ORAL | Status: DC
  Administered 2023-01-14 – 2023-01-17 (×6): 50 mg via ORAL
  Filled 2023-01-14 (×6): qty 2

## 2023-01-14 MED ORDER — LORAZEPAM 1 MG PO TABS: 0.5000 mg | ORAL_TABLET | ORAL | Status: DC | PRN

## 2023-01-14 MED ORDER — MAGNESIUM SULFATE 2 GM/50ML IV SOLN
2.0000 g | Freq: Once | INTRAVENOUS | Status: AC
  Administered 2023-01-14: 2 g via INTRAVENOUS
  Filled 2023-01-14: qty 50

## 2023-01-14 MED ORDER — AMIODARONE HCL 200 MG PO TABS
200.0000 mg | ORAL_TABLET | Freq: Every day | ORAL | Status: DC
  Administered 2023-01-15 – 2023-01-17 (×3): 200 mg via ORAL
  Filled 2023-01-14 (×3): qty 1

## 2023-01-14 MED ORDER — METOPROLOL TARTRATE 5 MG/5ML IV SOLN
5.0000 mg | Freq: Once | INTRAVENOUS | Status: AC
  Administered 2023-01-14: 5 mg via INTRAVENOUS
  Filled 2023-01-14: qty 5

## 2023-01-14 MED ORDER — METOPROLOL TARTRATE 5 MG/5ML IV SOLN
2.5000 mg | Freq: Once | INTRAVENOUS | Status: AC
  Administered 2023-01-14: 2.5 mg via INTRAVENOUS
  Filled 2023-01-14: qty 5

## 2023-01-14 MED ORDER — FUROSEMIDE 20 MG PO TABS
40.0000 mg | ORAL_TABLET | Freq: Every day | ORAL | Status: DC
  Administered 2023-01-15 – 2023-01-17 (×3): 40 mg via ORAL
  Filled 2023-01-14 (×3): qty 2

## 2023-01-14 MED ORDER — SODIUM CHLORIDE 0.9 % IV SOLN
1.0000 g | Freq: Once | INTRAVENOUS | Status: AC
  Administered 2023-01-14: 1 g via INTRAVENOUS
  Filled 2023-01-14: qty 10

## 2023-01-14 MED ORDER — ALBUTEROL SULFATE (2.5 MG/3ML) 0.083% IN NEBU: 2.5000 mg | INHALATION_SOLUTION | Freq: Four times a day (QID) | RESPIRATORY_TRACT | Status: DC | PRN

## 2023-01-14 MED ORDER — HALOPERIDOL 5 MG PO TABS: 5.0000 mg | ORAL_TABLET | ORAL | Status: DC | PRN

## 2023-01-14 NOTE — ED Provider Notes (Signed)
Jillian [name]   CSN: [id number] Arrival date & time: 01/14/23  1823     History {Add pertinent medical, surgical, social history, OB history to HPI:1} Chief Complaint  Patient presents with   Foot Pain    Jillian [name] is a 87 y.o. female.  She is brought in by EMS as a level 2 trauma after a fall on blood thinners.  She said she fell earlier today trying to walk to the bathroom and she slipped.  Level 5 caveat secondary to dementia.  Complaining of pain bilateral feet.  She has a history of A-fib on apixaban.  She denies hitting her head.  Per EMS she has been in and out of the hospital frequent falls and family does not feel like they can manage her symptoms at home.  She is on hospice with [facility].   The history is provided by the patient and the EMS personnel.  Fall This is a recurrent problem. Episode onset: today. The problem has not changed since onset.Pertinent negatives include no chest pain, no abdominal pain, no headaches and no shortness of breath. Associated symptoms comments: Bilat foot pain. The symptoms are aggravated by bending and twisting. Nothing relieves the symptoms. She has tried rest for the symptoms. The treatment provided no relief.       Home Medications Prior to Admission medications   Medication Sig Start Date End Date Taking? Authorizing Provider  amiodarone (PACERONE) 200 MG tablet Take 1 tablet (200 mg total) by mouth daily. 12/14/22 01/13/23  Barb Merino, MD  amLODipine (NORVASC) 5 MG tablet Take 5 mg by mouth daily.    [provider]  apixaban (ELIQUIS) 2.5 MG TABS tablet Take 1 tablet (2.5 mg total) by mouth 2 (two) times daily. 11/11/22   Argentina Donovan, PA-C  cholecalciferol (VITAMIN D3) 25 MCG (1000 UNIT) tablet Take 1,000 Units by mouth daily. 10/15/22   [provider]  diphenhydrAMINE (BENADRYL) 25 mg capsule Take 25 mg by mouth every 6 (six) hours as needed for  itching or allergies. Patient not taking: Reported on 01/12/2023 10/15/22   [provider]  furosemide (LASIX) 40 MG tablet Take 1 tablet (40 mg total) by mouth daily. 12/07/22 01/12/23  Barb Merino, MD  haloperidol (HALDOL) 5 MG tablet Take 5 mg by mouth every 4 (four) hours as needed for agitation. 12/16/22   [provider]  hydrALAZINE (APRESOLINE) 50 MG tablet Take 50 mg by mouth every 8 (eight) hours.    [provider]  levalbuterol Penne Lash) 1.25 MG/0.5ML nebulizer solution Take 1.25 mg by nebulization every 6 (six) hours as needed for wheezing or shortness of breath. 12/07/22 01/12/23  Barb Merino, MD  LORazepam (ATIVAN) 0.5 MG tablet Take 0.5 mg by mouth every 4 (four) hours as needed for anxiety. 12/27/22   [provider]  metoprolol tartrate (LOPRESSOR) 50 MG tablet Take 1 tablet (50 mg total) by mouth 2 (two) times daily. 12/07/22 01/12/23  Barb Merino, MD  polyethylene glycol (MIRALAX / GLYCOLAX) 17 g packet Take 17 g by mouth daily. 12/07/22   Barb Merino, MD  sennosides-docusate sodium (SENOKOT-S) 8.6-50 MG tablet Take 3 tablets by mouth at bedtime. Patient taking differently: Take 3 tablets by mouth at bedtime as needed for constipation. 10/01/21   [name], MD      Allergies    Other, Latex, Penicillins, Tape, Aspirin, Atorvastatin, and Diltiazem hcl    Review of Systems   Review  of Systems  Unable to perform ROS: Dementia  Respiratory:  Negative for shortness of breath.   Cardiovascular:  Negative for chest pain.  Gastrointestinal:  Negative for abdominal pain.  Neurological:  Negative for headaches.    Physical Exam Updated Vital Signs SpO2 100%  Physical Exam Vitals and nursing [name] reviewed.  Constitutional:      General: She is not in acute distress.    Appearance: Normal appearance. She is well-developed.  HENT:     Head: Normocephalic and atraumatic.  Eyes:     Conjunctiva/sclera: Conjunctivae normal.   Cardiovascular:     Rate and Rhythm: Tachycardia present. Rhythm irregular.     Heart sounds: No murmur heard. Pulmonary:     Effort: Pulmonary effort is normal. No respiratory distress.     Breath sounds: Normal breath sounds.  Abdominal:     Palpations: Abdomen is soft.     Tenderness: There is no abdominal tenderness. There is no guarding or rebound.  Musculoskeletal:        General: Tenderness present.     Cervical back: Neck supple.     Right lower leg: No edema.     Left lower leg: No edema.     Comments: She is diffusely tender in both feet.  There is no gross lesions no swelling no bruising.  Distal pulses intact.  Full range of motion of upper extremities without any pain or limitations.  No neck or back pain.  Skin:    General: Skin is warm and dry.     Capillary Refill: Capillary refill takes less than 2 seconds.  Neurological:     General: No focal deficit present.     Mental Status: She is alert. She is disoriented.     Motor: No weakness.     ED Results / Procedures / Treatments   Labs (all labs ordered are listed, but only abnormal results are displayed) Labs Reviewed  COMPREHENSIVE METABOLIC PANEL  CBC WITH DIFFERENTIAL/PLATELET  MAGNESIUM  BRAIN NATRIURETIC PEPTIDE  TROPONIN I (HIGH SENSITIVITY)    EKG None  Radiology No results found.  Procedures Procedures  {Document cardiac monitor, telemetry assessment procedure when appropriate:1}  Medications Ordered in ED Medications - No data to display  ED Course/ Medical Decision Making/ A&P   {   Click here for ABCD2, HEART and other calculatorsREFRESH [name] before signing :1}                          Medical Decision Making Amount and/or Complexity of Data Reviewed Labs: ordered. Radiology: ordered.   This patient complains of ***; this involves an extensive number of treatment Options and is a complaint that carries with it a high risk of complications and morbidity. The differential  includes ***  I ordered, reviewed and interpreted labs, which included *** I ordered medication *** and reviewed PMP when indicated. I ordered imaging studies which included *** and I independently    visualized and interpreted imaging which showed *** Additional history obtained from *** Previous records obtained and reviewed *** I consulted *** and discussed lab and imaging findings and discussed disposition.  Cardiac monitoring reviewed, *** Social determinants considered, *** Critical Interventions: ***  After the interventions stated above, I reevaluated the patient and found *** Admission and further testing considered, ***   {Document critical care time when appropriate:1} {Document review of labs and clinical decision tools ie heart score, Chads2Vasc2 etc:1}  {Document your independent review of  radiology images, and any outside records:1} {Document your discussion with family members, caretakers, and with consultants:1} {Document social determinants of health affecting pt's care:1} {Document your decision making why or why not admission, treatments were needed:1} Final Clinical Impression(s) / ED Diagnoses Final diagnoses:  None    Rx / DC Orders ED Discharge Orders     None

## 2023-01-14 NOTE — ED Triage Notes (Signed)
Pt arrived via ems for fall with bilat foot pain per medic pt is on elliquis with afib and dementia pt has had multiple falls recently when trying to get out of bed to use restroom Per medic daughter and caregiver states "I dont want her back and cannot take care of her" pt is on hospice. Pt is alert and oriented x 3 confused on time

## 2023-01-15 MED ORDER — CEPHALEXIN 250 MG PO CAPS
500.0000 mg | ORAL_CAPSULE | Freq: Two times a day (BID) | ORAL | Status: DC
  Administered 2023-01-15 – 2023-01-17 (×4): 500 mg via ORAL
  Filled 2023-01-15 (×4): qty 2

## 2023-01-15 NOTE — ED Notes (Signed)
Pt settled in room 48. Pt transferred to a hospital bed, and set back up to 2L O2 and purewick. Family is at bedside

## 2023-01-15 NOTE — NC FL2 (Cosign Needed Addendum)
[place] LEVEL OF CARE FORM     IDENTIFICATION  Patient Name: Jillian [name] Birthdate: 08/08/1936 Sex: female Admission Date (Current Location): 01/14/2023  [facility] and [place] Number:  Herbalist and Address:  [facility], [place] [address], [place], [place] [address]      Provider Number: [id number]  Attending Physician Name and Address:  No att. providers found  Relative Name and Phone Number:       Current Level of Care: Hospital Recommended Level of Care: [place] Prior Approval Number:    Date Approved/Denied:   PASRR Number: [id number]  Discharge Plan: SNF    Current Diagnoses: Patient Active Problem List   Diagnosis Date Noted   Multifocal pneumonia 12/29/2022   Occult GI bleeding 12/29/2022   Acute encephalopathy 12/29/2022   Liver lesion 12/29/2022   Acute on chronic respiratory failure with hypoxia and hypercapnia (HCC) 11/28/2022   Vitamin D deficiency 10/14/2022   Paroxysmal atrial fibrillation with RVR ([place]) 10/12/2022   Elevated brain natriuretic peptide (BNP) level 10/12/2022   Acute bronchitis 02/19/2022   Alopecia 01/05/2021   Pain in both feet 01/05/2021   Anemia due to stage 4 chronic kidney disease (HCC) 07/24/2020   Chronic diastolic heart failure ([place]) 07/24/2020   Hypertension with impaired renal function 07/24/2020   Mixed dyslipidemia 07/24/2020   Allergic rhinitis 07/24/2020   History of CVA (cerebrovascular accident) 07/24/2020   Prediabetes 07/24/2020   Acute renal failure superimposed on stage 4 chronic kidney disease ([place]) 07/09/2020   Class 2 severe obesity due to excess calories with serious comorbidity and body mass index (BMI) of 36.0 to 36.9 in adult ([place]) 10/28/2016   Allergic conjunctivitis of both eyes 05/27/2015   Benign essential hypertension 11/15/1898   Cerebral artery occlusion 11/15/1898    Orientation RESPIRATION BLADDER Height & Weight     Time,  Place, Self  Normal Continent Weight: 165 lb (74.8 kg) Height:  '5\' 6"'$  (167.6 cm)  BEHAVIORAL SYMPTOMS/MOOD NEUROLOGICAL BOWEL NUTRITION STATUS      Continent Diet  AMBULATORY STATUS COMMUNICATION OF NEEDS Skin   Extensive Assist Verbally Normal                       Personal Care Assistance Level of Assistance  Bathing, Feeding, Dressing Bathing Assistance: Limited assistance Feeding assistance: Limited assistance Dressing Assistance: Maximum assistance     Functional Limitations Info  Sight, Hearing, Speech Sight Info: Adequate Hearing Info: Adequate Speech Info: Adequate    SPECIAL CARE FACTORS FREQUENCY  PT (By licensed PT), OT (By licensed OT)     PT Frequency: 5x weekly OT Frequency: 5x weekly            Contractures Contractures Info: Not present    Additional Factors Info  Code Status, Allergies Code Status Info: DNR Allergies Info: Other  Latex  Penicillins  Tape  Aspirin  Atorvastatin  Diltiazem Hcl           Current Medications (01/15/2023):  This is the current hospital active medication list Current Facility-Administered Medications  Medication Dose Route Frequency Provider Last Rate Last Admin   albuterol (PROVENTIL) (2.5 MG/3ML) 0.083% nebulizer solution 2.5 mg  2.5 mg Nebulization Q6H PRN Hayden Rasmussen, MD       amiodarone (PACERONE) tablet 200 mg  200 mg Oral Daily Hayden Rasmussen, MD   200 mg at 01/15/23 1145   apixaban (ELIQUIS) tablet 2.5 mg  2.5 mg  Oral BID Hayden Rasmussen, MD   2.5 mg at 01/15/23 1145   furosemide (LASIX) tablet 40 mg  40 mg Oral Daily Hayden Rasmussen, MD   40 mg at 01/15/23 1145   metoprolol tartrate (LOPRESSOR) tablet 50 mg  50 mg Oral BID Hayden Rasmussen, MD   50 mg at 01/15/23 1145   Current Outpatient Medications  Medication Sig Dispense Refill   amiodarone (PACERONE) 200 MG tablet Take 1 tablet (200 mg total) by mouth daily. 30 tablet 0   amLODipine (NORVASC) 5 MG tablet Take 5 mg by mouth daily.      apixaban (ELIQUIS) 2.5 MG TABS tablet Take 1 tablet (2.5 mg total) by mouth 2 (two) times daily. 60 tablet 6   cholecalciferol (VITAMIN D3) 25 MCG (1000 UNIT) tablet Take 1,000 Units by mouth daily.     furosemide (LASIX) 40 MG tablet Take 1 tablet (40 mg total) by mouth daily. 30 tablet 0   haloperidol (HALDOL) 5 MG tablet Take 5 mg by mouth every 4 (four) hours as needed for agitation.     hydrALAZINE (APRESOLINE) 50 MG tablet Take 50 mg by mouth every 8 (eight) hours.     levalbuterol (XOPENEX) 1.25 MG/0.5ML nebulizer solution Take 1.25 mg by nebulization every 6 (six) hours as needed for wheezing or shortness of breath. 60 mL 0   LORazepam (ATIVAN) 0.5 MG tablet Take 0.5 mg by mouth every 4 (four) hours as needed for anxiety.     metoprolol tartrate (LOPRESSOR) 50 MG tablet Take 1 tablet (50 mg total) by mouth 2 (two) times daily. 60 tablet 0   polyethylene glycol (MIRALAX / GLYCOLAX) 17 g packet Take 17 g by mouth daily. 14 each 0   sennosides-docusate sodium (SENOKOT-S) 8.6-50 MG tablet Take 3 tablets by mouth at bedtime. (Patient taking differently: Take 3 tablets by mouth at bedtime as needed for constipation.) 60 tablet 4   diphenhydrAMINE (BENADRYL) 25 mg capsule Take 25 mg by mouth every 6 (six) hours as needed for itching or allergies. (Patient not taking: Reported on 01/12/2023)       Discharge Medications: Please see discharge summary for a list of discharge medications.  Relevant Imaging Results:  Relevant Lab Results:   Additional Information SSN: [id number]  Archie Endo, LCSW

## 2023-01-15 NOTE — ED Provider Notes (Signed)
Briefly this is an 87 year old female with HTN, paroxysmal A-fib on anticoagulation, history of stroke and CKD who was brought in as a trauma for a fall.  Imaging obtained showed no evidence of acute traumatic injury.  Her UA was suggestive of UTI given IV Rocephin.  Has known history of heart failure and troponin chronically elevated at baseline.  She had no findings requiring emergent admission.  Family felt unable to take care of her at home.  She is under hospice.  I have ordered continue Keflex for treatment of UTI.  TOC PT and OT are involved to determine if placement warranted.   Elgie Congo, MD 01/15/23 [phone]

## 2023-01-15 NOTE — ED Notes (Signed)
RN introduced self to family at bedside. Pt clean and dry at this time, on 2L [place] and purwick sleeping.

## 2023-01-15 NOTE — ED Notes (Signed)
Patient's daughter Raynelle Fanning) called asking question about what is going on with patient's care. Daughter was very rude and initially did not ID her self to the RN. RN advised she will be best served to wait til til after 8 am when Social work or case management can give better information on where patient will go; Family was asking why she over heard someone say the patient was going to be award of the state, RN advised there was no information regarding that; Daughter inquired about visiting hours and if patient had been changed. She also was inquiring about what type of area patient was in; RN attempted to explain that patient was in a holding area at this time. And she had her night meds and currently sleeping. RN was unable to determine if consent was given for this specific family member or if communication was with any other family member; For HIPAA reasons RN did not remit any detail information until this info could be verified-Monique,RN

## 2023-01-15 NOTE — Care Management (Addendum)
Patient just left [facility] as she did not meet the 2 week window. She is currently residing with her daughter and is followed by [facility]. She keeps falling and daughter feels she cannot take care of her. Reached out to shania Junious from [facility], she will reach out the family.  [place] from hospice reached out to family and had a conference call with daughters. They want to revoke hospice and move forward with SNF placement. CSW made aware. PT evaluation complete

## 2023-01-15 NOTE — ED Notes (Signed)
PT at bedside update given to daughter whom is at bedside

## 2023-01-15 NOTE — Progress Notes (Addendum)
[facility])       This patient is a current hospice patient with [facility]. MSW conducted three-way call with daughter/[name] & daughter/Michelle to conduct [place] conversation.   MSW provided education of hospice philosophy and Medicare coverage for services. Family share w/ MSW that patient has continued to experience weakness and that they would like for patient to transfer to SNF rehab. MSW voiced understanding and offered revocation of services & family in agreement to revoke hospice benefit.  Necessary documentation has been sent to family & TOC/Stephanie aware of above.  Addendum: Effective 3.2.24, Ms. Caso is no longer an active patient of [facility] hospice services as family is seeking SNF rehab. Per family request, [facility] PMT to follow upon DC once bed found for SNF.   [facility] Liaison to sign off.     Please call with any questions/concerns.    Thank you for the opportunity to participate in this patient's care.  Daphene Calamity, MSW [facility] Liaison  [phone]

## 2023-01-15 NOTE — Evaluation (Signed)
Physical Therapy Evaluation Patient Details Name: Jillian [name] MRN: [id number] DOB: 07-03-1936 Today's Date: 01/15/2023  History of Present Illness  87 y.o. female presented to ED 3/1 after sustaining several falls at home. PMhx; CKD, CHF, CVA, HTN, Afib  Clinical Impression  Pt admitted with above diagnosis. Daughter reports pt has been essentially bed bound since returning home from residential hospice, but fell 3 times yesterday. Does not feel they can adequately care for patient at home. Desires patient to go to SNF for rehab until she is strong enough to return home however pt is under hospice care. Patient currently requires mod assist for bed mobility and sit<>stand transfers. Was unable to safely stand unsupported, take steps, or pivot to chair with single person assist due to heavy posterior lean and reduced cognitive ability/ deficit awareness. Spoke with SW who reports hospice is reaching out to family for dispo options.  Pt currently with functional limitations due to the deficits listed below (see PT Problem List). Pt will benefit from skilled PT to increase their independence and safety with mobility to allow discharge to the venue listed below.          Recommendations for follow up therapy are one component of a multi-disciplinary discharge planning process, led by the attending physician.  Recommendations may be updated based on patient status, additional functional criteria and insurance authorization.  Follow Up Recommendations Other (comment) (Pending hospice offerings - If pt unable to enter residential hospice, is declined by SNF, and ultimately returns home - she would need PTAR transport, hospital bed and a hoyerlift with maximum HH services due to limited family ability.)      Assistance Recommended at Discharge Frequent or constant Supervision/Assistance  Patient can return home with the following  Two people to help with walking and/or transfers;Assistance with  cooking/housework;A lot of help with bathing/dressing/bathroom;Assist for transportation;Help with stairs or ramp for entrance;Direct supervision/assist for medications management;Direct supervision/assist for financial management    Equipment Recommendations Other (comment) (Pending hospice offerings; to return home would need PTAR transport, hoyerlift, hospital bed.)  Recommendations for Other Services       Functional Status Assessment Patient has had a recent decline in their functional status and demonstrates the ability to make significant improvements in function in a reasonable and predictable amount of time.     Precautions / Restrictions Precautions Precautions: Fall Restrictions Weight Bearing Restrictions: No      Mobility  Bed Mobility Overal bed mobility: Needs Assistance Bed Mobility: Supine to Sit, Sit to Supine     Supine to sit: Mod assist Sit to supine: Mod assist   General bed mobility comments: Mod assist for trunk and LEs to rise to edge of stretcher. Cues for sequencing throughout.    Transfers Overall transfer level: Needs assistance Equipment used: Rolling walker (2 wheels) Transfers: Sit to/from Stand Sit to Stand: Mod assist, From elevated surface           General transfer comment: Mod assist to rise from edge of stretcher. Demonstrates heavy posterior lean with limited ability to lean forward away from stretcher despite facilitatory, multimodal cues and UE support on RW. Heavy mod assist for pt to sit back and reposition on stretcher.    Ambulation/Gait             Pre-gait activities: Standing balance, weight shifting, able to briefly lift up right foot, but still leaning against stretcher. Slight leftward lean.    Stairs  Wheelchair Mobility    Modified Rankin (Stroke Patients Only)       Balance Overall balance assessment: Needs assistance Sitting-balance support: Single extremity supported, Feet  unsupported Sitting balance-Leahy Scale: Poor   Postural control: Left lateral lean, Posterior lean Standing balance support: Bilateral upper extremity supported, Reliant on assistive device for balance Standing balance-Leahy Scale: Poor Standing balance comment: Briefly with heavy cues and assist able to bring LEs away from stretcher but limited tolerance.                             Pertinent Vitals/Pain Pain Assessment Pain Assessment: No/denies pain    Home Living Family/patient expects to be discharged to:: Private residence Living Arrangements: Children Available Help at Discharge: Family;Available PRN/intermittently (trying to arrange 24/hr care) Type of Home: House Home Access: Stairs to enter Entrance Stairs-Rails: None Entrance Stairs-Number of Steps: 3   Home Layout: One level Home Equipment: International aid/development worker (2 wheels);Wheelchair - manual      Prior Function Prior Level of Function : Needs assist;History of Falls (last six months)             Mobility Comments: Family assisted with walking room to room using RW ADLs Comments: Needed assistance with bathing and dressing from family     Hand Dominance   Dominant Hand: Right    Extremity/Trunk Assessment   Upper Extremity Assessment Upper Extremity Assessment: Defer to OT evaluation    Lower Extremity Assessment Lower Extremity Assessment: Generalized weakness;Difficult to assess due to impaired cognition       Communication   Communication: No difficulties  Cognition Arousal/Alertness: Awake/alert Behavior During Therapy: Flat affect Overall Cognitive Status: Impaired/Different from baseline Area of Impairment: Orientation, Safety/judgement, Following commands, Problem solving, Memory                 Orientation Level: Disoriented to, Time, Situation   Memory: Decreased short-term memory Following Commands: Follows one step commands with increased  time Safety/Judgement: Decreased awareness of safety, Decreased awareness of deficits   Problem Solving: Slow processing, Requires verbal cues          General Comments General comments (skin integrity, edema, etc.): SpO2 94% on RA during session. Returned to 2L (baseline) and SpO2 at 100%. HR in 120s while working with PT    Exercises     Assessment/Plan    PT Assessment Patient needs continued PT services  PT Problem List Decreased strength;Decreased activity tolerance;Decreased balance;Decreased mobility;Decreased coordination;Decreased cognition;Decreased knowledge of use of DME;Decreased safety awareness;Decreased knowledge of precautions;Cardiopulmonary status limiting activity;Obesity       PT Treatment Interventions DME instruction;Gait training;Functional mobility training;Therapeutic activities;Therapeutic exercise;Stair training;Balance training;Neuromuscular re-education;Patient/family education;Cognitive remediation;Wheelchair mobility training    PT Goals (Current goals can be found in the Care Plan section)  Acute Rehab PT Goals Patient Stated Goal: None PT Goal Formulation: With patient/family Time For Goal Achievement: 01/29/23 Potential to Achieve Goals: Fair    Frequency Min 2X/week     Co-evaluation               AM-PAC PT "6 Clicks" Mobility  Outcome Measure Help needed turning from your back to your side while in a flat bed without using bedrails?: A Little Help needed moving from lying on your back to sitting on the side of a flat bed without using bedrails?: A Lot Help needed moving to and from a bed to a chair (including a wheelchair)?: A Lot Help needed  standing up from a chair using your arms (e.g., wheelchair or bedside chair)?: A Lot Help needed to walk in hospital room?: Total Help needed climbing 3-5 steps with a railing? : Total 6 Click Score: 11    End of Session Equipment Utilized During Treatment: Gait belt;Oxygen Activity  Tolerance: Patient limited by fatigue Patient left: in bed;with call bell/phone within reach;with family/visitor present (Stretcher, rails up) Nurse Communication: Mobility status PT Visit Diagnosis: Unsteadiness on feet (R26.81);Repeated falls (R29.6);Muscle weakness (generalized) (M62.81);History of falling (Z91.81);Difficulty in walking, not elsewhere classified (R26.2)    Time: [id number] PT Time Calculation (min) (ACUTE ONLY): 24 min   Charges:   PT Evaluation $PT Eval Moderate Complexity: 1 Mod PT Treatments $Therapeutic Activity: 8-22 mins        Candie Mile, PT, DPT Physical Therapist Acute Rehabilitation Services [place]   Ellouise Newer 01/15/2023, 9:55 AM

## 2023-01-15 NOTE — ED Notes (Signed)
Daughter at bedside assisted pt with eating dinner.

## 2023-01-15 NOTE — Progress Notes (Signed)
CSW completed FL2 and faxed patient's clinical information out for review.  Madilyn Fireman, MSW, LCSW Transitions of Care  Clinical Social Worker II [phone]

## 2023-01-15 NOTE — ED Notes (Signed)
Secretary called to verify SW has be notified and assisting with next steps on home health referral

## 2023-01-16 MED ORDER — POTASSIUM CHLORIDE CRYS ER 20 MEQ PO TBCR
20.0000 meq | EXTENDED_RELEASE_TABLET | ORAL | Status: DC
  Administered 2023-01-16: 20 meq via ORAL
  Filled 2023-01-16: qty 1

## 2023-01-16 MED ORDER — POTASSIUM CHLORIDE 20 MEQ PO PACK
20.0000 meq | PACK | Freq: Once | ORAL | Status: AC
  Administered 2023-01-16: 20 meq via ORAL
  Filled 2023-01-16: qty 1

## 2023-01-16 NOTE — ED Notes (Signed)
Patient had a bout of emesis after eating lunch. Dr. Nechama Guard notified of difficulty with swallowing and keeping food down. Speech evaluation ordered for swallow study.

## 2023-01-16 NOTE — Progress Notes (Signed)
SLP Cancellation Note  Patient Details Name: Jillian [name] MRN: [id number] DOB: 1935-12-22   Cancelled treatment:       Reason Eval/Treat Not Completed: Patient declined, no reason specified (Pt requested that evaluation be deferred since she was fearful of emesis; consider meds whole/crushed with puree until evaluation can be complete. Consider esophageal evaluation (e.g. esophagram due to report of food "getting stuck in chest".Tobie Poet I. Hardin Negus, [place], [place] Office number [phone]   Horton Marshall 01/16/2023, 4:16 PM

## 2023-01-16 NOTE — Care Management (Signed)
As per Hospice note, patient and family deactivating hospice, FYI deactivated.

## 2023-01-16 NOTE — Progress Notes (Signed)
Ins Josem Kaufmann has been started.  Achaia Garlock Tarpley-Carter, MSW, LCSW-A Pronouns:  She/Her/Hers [facility] Clinical Social Worker Direct Number:  [phone] [email or web address]

## 2023-01-16 NOTE — Progress Notes (Signed)
TOC CSW spoke with pts daughter, Jillian [name] [phone].  CSW presented Jillian [name] with bed offers, and [facility] was chosen.  CSW will began ins. auth.  Jillian [name], MSW, LCSW-A Pronouns:  She/Her/Hers [facility] Clinical Social Worker Direct Number:  [phone] [name]

## 2023-01-16 NOTE — ED Provider Notes (Signed)
Emergency Medicine Observation Re-evaluation Note  Jillian [name] is a 87 y.o. female, seen on rounds today.  Pt initially presented to the ED for complaints of recent falls, family requesting snf placement.   Physical Exam  BP (!) 140/59 (BP Location: Left Arm)   Pulse 75   Temp 98.2 F (36.8 C) (Oral)   Resp 16   Ht 1.676 m ('5\' 6"'$ )   Wt 74.8 kg   SpO2 100%   BMI 26.63 kg/m  Physical Exam General: resting, nad.  Cardiac: regular rate. Lungs: breathing comfortably.   ED Course / MDM    I have reviewed the labs performed to date as well as medications administered while in observation.  Recent changes in the last 24 hours include ED obs, PT and TOC consults, reassessment.   Plan  Pt awaiting SNF placement. (Pt had been followed by hospice care and was recently admitted to [facility]/inpatient hospice in anticipation of end-of-life care, however pts condition stabilized and pt recently d/c'd back to home. Currently, as pt/family requesting SNF, hospice care has signed off).        Lajean Saver, MD 01/16/23 [phone]

## 2023-01-17 DIAGNOSIS — Z91040 Latex allergy status: Secondary | ICD-10-CM | POA: Diagnosis not present

## 2023-01-17 DIAGNOSIS — F0390 Unspecified dementia without behavioral disturbance: Secondary | ICD-10-CM | POA: Diagnosis present

## 2023-01-17 DIAGNOSIS — Z515 Encounter for palliative care: Secondary | ICD-10-CM

## 2023-01-17 DIAGNOSIS — Z66 Do not resuscitate: Secondary | ICD-10-CM | POA: Diagnosis not present

## 2023-01-17 DIAGNOSIS — R1310 Dysphagia, unspecified: Secondary | ICD-10-CM | POA: Diagnosis not present

## 2023-01-17 DIAGNOSIS — I4891 Unspecified atrial fibrillation: Secondary | ICD-10-CM | POA: Diagnosis not present

## 2023-01-17 DIAGNOSIS — R279 Unspecified lack of coordination: Secondary | ICD-10-CM | POA: Diagnosis not present

## 2023-01-17 DIAGNOSIS — G9340 Encephalopathy, unspecified: Secondary | ICD-10-CM | POA: Diagnosis not present

## 2023-01-17 DIAGNOSIS — K143 Hypertrophy of tongue papillae: Secondary | ICD-10-CM | POA: Diagnosis not present

## 2023-01-17 DIAGNOSIS — R6889 Other general symptoms and signs: Secondary | ICD-10-CM | POA: Diagnosis not present

## 2023-01-17 DIAGNOSIS — I2489 Other forms of acute ischemic heart disease: Secondary | ICD-10-CM | POA: Diagnosis not present

## 2023-01-17 DIAGNOSIS — D631 Anemia in chronic kidney disease: Secondary | ICD-10-CM | POA: Diagnosis not present

## 2023-01-17 DIAGNOSIS — Z803 Family history of malignant neoplasm of breast: Secondary | ICD-10-CM | POA: Diagnosis not present

## 2023-01-17 DIAGNOSIS — J9610 Chronic respiratory failure, unspecified whether with hypoxia or hypercapnia: Secondary | ICD-10-CM | POA: Diagnosis not present

## 2023-01-17 DIAGNOSIS — I130 Hypertensive heart and chronic kidney disease with heart failure and stage 1 through stage 4 chronic kidney disease, or unspecified chronic kidney disease: Secondary | ICD-10-CM | POA: Diagnosis not present

## 2023-01-17 DIAGNOSIS — Z7401 Bed confinement status: Secondary | ICD-10-CM | POA: Diagnosis not present

## 2023-01-17 DIAGNOSIS — E559 Vitamin D deficiency, unspecified: Secondary | ICD-10-CM | POA: Diagnosis not present

## 2023-01-17 DIAGNOSIS — R0689 Other abnormalities of breathing: Secondary | ICD-10-CM | POA: Diagnosis not present

## 2023-01-17 DIAGNOSIS — I5032 Chronic diastolic (congestive) heart failure: Secondary | ICD-10-CM | POA: Diagnosis not present

## 2023-01-17 DIAGNOSIS — E876 Hypokalemia: Secondary | ICD-10-CM | POA: Diagnosis not present

## 2023-01-17 DIAGNOSIS — W19XXXA Unspecified fall, initial encounter: Secondary | ICD-10-CM

## 2023-01-17 DIAGNOSIS — I5033 Acute on chronic diastolic (congestive) heart failure: Secondary | ICD-10-CM | POA: Diagnosis not present

## 2023-01-17 DIAGNOSIS — Z7901 Long term (current) use of anticoagulants: Secondary | ICD-10-CM | POA: Diagnosis not present

## 2023-01-17 DIAGNOSIS — Z743 Need for continuous supervision: Secondary | ICD-10-CM | POA: Diagnosis not present

## 2023-01-17 DIAGNOSIS — E538 Deficiency of other specified B group vitamins: Secondary | ICD-10-CM | POA: Diagnosis not present

## 2023-01-17 DIAGNOSIS — I499 Cardiac arrhythmia, unspecified: Secondary | ICD-10-CM | POA: Diagnosis not present

## 2023-01-17 DIAGNOSIS — J9621 Acute and chronic respiratory failure with hypoxia: Secondary | ICD-10-CM | POA: Diagnosis not present

## 2023-01-17 DIAGNOSIS — R239 Unspecified skin changes: Secondary | ICD-10-CM | POA: Diagnosis not present

## 2023-01-17 DIAGNOSIS — I4819 Other persistent atrial fibrillation: Secondary | ICD-10-CM | POA: Diagnosis not present

## 2023-01-17 DIAGNOSIS — I10 Essential (primary) hypertension: Secondary | ICD-10-CM | POA: Diagnosis not present

## 2023-01-17 DIAGNOSIS — R0600 Dyspnea, unspecified: Secondary | ICD-10-CM | POA: Diagnosis not present

## 2023-01-17 DIAGNOSIS — I509 Heart failure, unspecified: Secondary | ICD-10-CM | POA: Diagnosis not present

## 2023-01-17 DIAGNOSIS — F03911 Unspecified dementia, unspecified severity, with agitation: Secondary | ICD-10-CM | POA: Diagnosis not present

## 2023-01-17 DIAGNOSIS — I110 Hypertensive heart disease with heart failure: Secondary | ICD-10-CM | POA: Diagnosis not present

## 2023-01-17 DIAGNOSIS — Z886 Allergy status to analgesic agent status: Secondary | ICD-10-CM | POA: Diagnosis not present

## 2023-01-17 DIAGNOSIS — M79672 Pain in left foot: Secondary | ICD-10-CM | POA: Diagnosis not present

## 2023-01-17 DIAGNOSIS — J9620 Acute and chronic respiratory failure, unspecified whether with hypoxia or hypercapnia: Secondary | ICD-10-CM | POA: Diagnosis not present

## 2023-01-17 DIAGNOSIS — N184 Chronic kidney disease, stage 4 (severe): Secondary | ICD-10-CM | POA: Diagnosis not present

## 2023-01-17 DIAGNOSIS — M6281 Muscle weakness (generalized): Secondary | ICD-10-CM | POA: Diagnosis not present

## 2023-01-17 DIAGNOSIS — I480 Paroxysmal atrial fibrillation: Secondary | ICD-10-CM | POA: Diagnosis not present

## 2023-01-17 DIAGNOSIS — R410 Disorientation, unspecified: Secondary | ICD-10-CM | POA: Diagnosis not present

## 2023-01-17 DIAGNOSIS — N390 Urinary tract infection, site not specified: Secondary | ICD-10-CM | POA: Diagnosis not present

## 2023-01-17 DIAGNOSIS — Z79899 Other long term (current) drug therapy: Secondary | ICD-10-CM | POA: Diagnosis not present

## 2023-01-17 DIAGNOSIS — M79671 Pain in right foot: Secondary | ICD-10-CM | POA: Diagnosis not present

## 2023-01-17 DIAGNOSIS — Z888 Allergy status to other drugs, medicaments and biological substances status: Secondary | ICD-10-CM | POA: Diagnosis not present

## 2023-01-17 DIAGNOSIS — E869 Volume depletion, unspecified: Secondary | ICD-10-CM | POA: Diagnosis not present

## 2023-01-17 DIAGNOSIS — M79601 Pain in right arm: Secondary | ICD-10-CM | POA: Diagnosis not present

## 2023-01-17 DIAGNOSIS — I252 Old myocardial infarction: Secondary | ICD-10-CM | POA: Diagnosis not present

## 2023-01-17 DIAGNOSIS — R296 Repeated falls: Secondary | ICD-10-CM | POA: Diagnosis not present

## 2023-01-17 DIAGNOSIS — K769 Liver disease, unspecified: Secondary | ICD-10-CM | POA: Diagnosis not present

## 2023-01-17 DIAGNOSIS — Z8249 Family history of ischemic heart disease and other diseases of the circulatory system: Secondary | ICD-10-CM | POA: Diagnosis not present

## 2023-01-17 DIAGNOSIS — Z880 Allergy status to penicillin: Secondary | ICD-10-CM | POA: Diagnosis not present

## 2023-01-17 DIAGNOSIS — Z91048 Other nonmedicinal substance allergy status: Secondary | ICD-10-CM | POA: Diagnosis not present

## 2023-01-17 DIAGNOSIS — L89153 Pressure ulcer of sacral region, stage 3: Secondary | ICD-10-CM | POA: Diagnosis not present

## 2023-01-17 DIAGNOSIS — R5381 Other malaise: Secondary | ICD-10-CM | POA: Diagnosis not present

## 2023-01-17 DIAGNOSIS — Z7409 Other reduced mobility: Secondary | ICD-10-CM | POA: Diagnosis not present

## 2023-01-17 DIAGNOSIS — I5020 Unspecified systolic (congestive) heart failure: Secondary | ICD-10-CM | POA: Diagnosis not present

## 2023-01-17 DIAGNOSIS — U071 COVID-19: Secondary | ICD-10-CM | POA: Diagnosis not present

## 2023-01-17 NOTE — ED Notes (Signed)
Family at bedside. 

## 2023-01-17 NOTE — ED Notes (Signed)
Family at bedside. Calm and cooperative at this time.

## 2023-01-17 NOTE — ED Provider Notes (Signed)
  Physical Exam  BP (!) 151/64   Pulse 88   Temp 98.4 F (36.9 C) (Oral)   Resp 17   Ht 1.676 m ('5\' 6"'$ )   Wt 74.8 kg   SpO2 100%   BMI 26.63 kg/m   Physical Exam  Procedures  Procedures  ED Course / MDM   Clinical Course as of 01/17/23 1051  Mon Jan 17, 2023  0933 B Natriuretic Peptide(!): 1,043.5 [DR]    Clinical Course User Index [DR] Pattricia Boss, MD   Medical Decision Making Amount and/or Complexity of Data Reviewed Labs: ordered. Decision-making details documented in ED Course. Radiology: ordered.  Risk Prescription drug management.   87 year old lady history of A-fib, on anticoagulation who has been on hospice until this past ED visit.  She came in because she fell and was anticoagulated.  After arrival here she was evaluated and did not appear to have any acute traumatic injuries.  Family stated that they could not care for her at home.  They rescinded the hospice care.  They wish patient to go to rehab facility.  Patient has been here over the weekend and is on day 4.  Has remained hemodynamically stable although there are multiple medical problems present.  She has now been accepted to a skilled nursing facility and will be discharged.       Pattricia Boss, MD 01/17/23 [phone]

## 2023-01-17 NOTE — Progress Notes (Signed)
Patient discharging to [facility] for SNF placement. Number for report is [phone]. Patient is going to room 144P. CSW provided information to patients RN. Patients daughter was also made aware.

## 2023-01-17 NOTE — ED Notes (Signed)
Updated pt's family member of plan of care per CSW note from 0907 this morning.

## 2023-01-17 NOTE — ED Notes (Signed)
Palliative called per verbal order by Dr. Jeanell Sparrow

## 2023-01-17 NOTE — ED Notes (Signed)
PTAR called, have patient ready for pick up "in the next little bit"

## 2023-01-17 NOTE — Evaluation (Signed)
Clinical/Bedside Swallow Evaluation Patient Details  Name: Jillian [name] MRN: [id number] Date of Birth: Oct 23, 1936  Today's Date: 01/17/2023 Time: SLP Start Time (ACUTE ONLY): 1100      Past Medical History:  Past Medical History:  Diagnosis Date   Acute CVA (cerebrovascular accident) ([place]) 07/12/2020   Hypertension    Renal disorder    CKD Stage IV   Stroke [facility])    Past Surgical History:  Past Surgical History:  Procedure Laterality Date   CARDIAC CATHETERIZATION  2009   HPI:  Pt is an 87 y.o. female presented to ED 3/1 after sustaining several falls at home. PMhx; CKD, CHF, CVA, HTN, Afib, GERD    Assessment / Plan / Recommendation  Clinical Impression  Pt declined participation in bedside swallowing assessment yesterday, and today was willing to drink only water, declining all other offers of solid foods. Oral mechanism exam was normal. There were no focal CN deficits. There was adequate oral recognition, manipulation, and palpable swallow of repetitive sips of thin liquid with no s/s of aspiration.  Chart indicated reports of globus - pt may benefit from esophageal workup at some point; she is apparently D/Cing back to facility today. SLP Visit Diagnosis: Dysphagia, unspecified (R13.10)    Aspiration Risk  No limitations    Diet Recommendation   Continue regular solids, thin liquids  Medication Administration: Whole meds with liquid    Other  Recommendations Oral Care Recommendations: Oral care BID    Recommendations for follow up therapy are one component of a multi-disciplinary discharge planning process, led by the attending physician.  Recommendations may be updated based on patient status, additional functional criteria and insurance authorization.  Follow up Recommendations No SLP follow up        Swallow Study   General Date of Onset: 01/15/23 HPI: Pt is an 87 y.o. female presented to ED 3/1 after sustaining several falls at home. PMhx; CKD, CHF, CVA, HTN,  Afib, GERD Type of Study: Bedside Swallow Evaluation Previous Swallow Assessment: none Diet Prior to this Study: Regular;Thin liquids (Level 0) Temperature Spikes Noted: No History of Recent Intubation: No Behavior/Cognition: Alert;Cooperative Oral Cavity Assessment: Within Functional Limits Oral Care Completed by SLP: No Oral Cavity - Dentition: Adequate natural dentition Vision: Functional for self-feeding Self-Feeding Abilities: Able to feed self Patient Positioning: Upright in bed Baseline Vocal Quality: Normal Volitional Cough: Strong Volitional Swallow: Able to elicit    Oral/Motor/Sensory Function Overall Oral Motor/Sensory Function: Within functional limits   Ice Chips Ice chips: Within functional limits   Thin Liquid Thin Liquid: Within functional limits    Nectar Thick Nectar Thick Liquid: Not tested   Honey Thick Honey Thick Liquid: Not tested   Puree Puree:  (declined)   Solid     Solid:  (declined)      Juan Quam Laurice 01/17/2023,11:36 AM  Estill Bamberg L. Tivis Ringer, MA CCC/SLP Clinical Specialist - [place] Office number [phone]

## 2023-01-17 NOTE — ED Notes (Signed)
Attempted to call report to [facility] x 1

## 2023-01-17 NOTE — ED Notes (Signed)
Notified [facility] of need for PTAR to be called.

## 2023-01-17 NOTE — Progress Notes (Signed)
CSW spoke with Whitney with centralized intake for [facility] who confirmed the SNF bed offer and can take patient when insurance authorization is approved.

## 2023-01-17 NOTE — Progress Notes (Addendum)
Brief Palliative Medicine Progress Note:  PMT consult received and chart reviewed.   Noted Jillian [name] is/was a current/recent hospice patient with [facility] I spoke with Jillian [name], Jillian [name], who is aware the patient is in the ED. An [facility] representative, Jillian [name], is coming to the hospital to discuss [place] and options as they have an established relationship with Jillian [name] and family. They do not feel PMT involvement is needed at this time.   [facility] will reach out to PMT directly if any needs arise and PMT will shadow peripherally for needs.  Thank you for allowing PMT to assist in the care of this patient.  11:15 AM Notified hospice [name] of patient's discharge. They can continue [place] after discharge.  Jillian [name] [facility] Palliative Medicine Team Team Phone: [phone] NO CHARGE

## 2023-01-18 DIAGNOSIS — R5381 Other malaise: Secondary | ICD-10-CM | POA: Diagnosis not present

## 2023-01-18 DIAGNOSIS — I509 Heart failure, unspecified: Secondary | ICD-10-CM | POA: Diagnosis not present

## 2023-01-18 DIAGNOSIS — I4891 Unspecified atrial fibrillation: Secondary | ICD-10-CM | POA: Diagnosis not present

## 2023-01-18 DIAGNOSIS — E538 Deficiency of other specified B group vitamins: Secondary | ICD-10-CM | POA: Diagnosis not present

## 2023-01-18 DIAGNOSIS — N184 Chronic kidney disease, stage 4 (severe): Secondary | ICD-10-CM | POA: Diagnosis not present

## 2023-01-18 DIAGNOSIS — I10 Essential (primary) hypertension: Secondary | ICD-10-CM | POA: Diagnosis not present

## 2023-01-18 DIAGNOSIS — F03911 Unspecified dementia, unspecified severity, with agitation: Secondary | ICD-10-CM | POA: Diagnosis not present

## 2023-01-19 DIAGNOSIS — I5020 Unspecified systolic (congestive) heart failure: Secondary | ICD-10-CM | POA: Diagnosis not present

## 2023-01-19 DIAGNOSIS — M6281 Muscle weakness (generalized): Secondary | ICD-10-CM | POA: Diagnosis not present

## 2023-01-19 DIAGNOSIS — L89153 Pressure ulcer of sacral region, stage 3: Secondary | ICD-10-CM | POA: Diagnosis not present

## 2023-01-19 DIAGNOSIS — R239 Unspecified skin changes: Secondary | ICD-10-CM | POA: Diagnosis not present

## 2023-01-19 DIAGNOSIS — N184 Chronic kidney disease, stage 4 (severe): Secondary | ICD-10-CM | POA: Diagnosis not present

## 2023-01-20 DIAGNOSIS — I10 Essential (primary) hypertension: Secondary | ICD-10-CM | POA: Diagnosis not present

## 2023-01-20 DIAGNOSIS — I509 Heart failure, unspecified: Secondary | ICD-10-CM | POA: Diagnosis not present

## 2023-01-20 DIAGNOSIS — N390 Urinary tract infection, site not specified: Secondary | ICD-10-CM | POA: Diagnosis not present

## 2023-01-20 DIAGNOSIS — I4891 Unspecified atrial fibrillation: Secondary | ICD-10-CM | POA: Diagnosis not present

## 2023-01-20 DIAGNOSIS — Z7409 Other reduced mobility: Secondary | ICD-10-CM | POA: Diagnosis not present

## 2023-01-25 DIAGNOSIS — F03911 Unspecified dementia, unspecified severity, with agitation: Secondary | ICD-10-CM | POA: Diagnosis not present

## 2023-01-25 DIAGNOSIS — R5381 Other malaise: Secondary | ICD-10-CM | POA: Diagnosis not present

## 2023-01-26 ENCOUNTER — Telehealth: Payer: Self-pay

## 2023-01-26 DIAGNOSIS — N184 Chronic kidney disease, stage 4 (severe): Secondary | ICD-10-CM | POA: Diagnosis not present

## 2023-01-26 DIAGNOSIS — R239 Unspecified skin changes: Secondary | ICD-10-CM | POA: Diagnosis not present

## 2023-01-26 DIAGNOSIS — M6281 Muscle weakness (generalized): Secondary | ICD-10-CM | POA: Diagnosis not present

## 2023-01-26 DIAGNOSIS — L89153 Pressure ulcer of sacral region, stage 3: Secondary | ICD-10-CM | POA: Diagnosis not present

## 2023-01-26 DIAGNOSIS — I5020 Unspecified systolic (congestive) heart failure: Secondary | ICD-10-CM | POA: Diagnosis not present

## 2023-01-26 NOTE — Telephone Encounter (Signed)
        Patient  visited [place] on 3/4  Telephone encounter attempt :  1st  A HIPAA compliant voice message was left requesting a return call.  Instructed patient to call back.    Malike Foglio [facility] Health Care Guide, [place] [phone] [address], [place], [place] [address] Phone: [phone] Email: [email or web address]       

## 2023-01-27 DIAGNOSIS — K143 Hypertrophy of tongue papillae: Secondary | ICD-10-CM | POA: Diagnosis not present

## 2023-02-02 DIAGNOSIS — N184 Chronic kidney disease, stage 4 (severe): Secondary | ICD-10-CM | POA: Diagnosis not present

## 2023-02-02 DIAGNOSIS — R239 Unspecified skin changes: Secondary | ICD-10-CM | POA: Diagnosis not present

## 2023-02-02 DIAGNOSIS — L89153 Pressure ulcer of sacral region, stage 3: Secondary | ICD-10-CM | POA: Diagnosis not present

## 2023-02-02 DIAGNOSIS — M6281 Muscle weakness (generalized): Secondary | ICD-10-CM | POA: Diagnosis not present

## 2023-02-02 DIAGNOSIS — I5020 Unspecified systolic (congestive) heart failure: Secondary | ICD-10-CM | POA: Diagnosis not present

## 2023-02-02 DIAGNOSIS — R5381 Other malaise: Secondary | ICD-10-CM | POA: Diagnosis not present

## 2023-02-02 DIAGNOSIS — F03911 Unspecified dementia, unspecified severity, with agitation: Secondary | ICD-10-CM | POA: Diagnosis not present

## 2023-02-02 DIAGNOSIS — M79601 Pain in right arm: Secondary | ICD-10-CM | POA: Diagnosis not present

## 2023-02-08 DIAGNOSIS — I4891 Unspecified atrial fibrillation: Secondary | ICD-10-CM | POA: Diagnosis not present

## 2023-02-08 DIAGNOSIS — F03911 Unspecified dementia, unspecified severity, with agitation: Secondary | ICD-10-CM | POA: Diagnosis not present

## 2023-02-09 DIAGNOSIS — N184 Chronic kidney disease, stage 4 (severe): Secondary | ICD-10-CM | POA: Diagnosis not present

## 2023-02-09 DIAGNOSIS — I5020 Unspecified systolic (congestive) heart failure: Secondary | ICD-10-CM | POA: Diagnosis not present

## 2023-02-09 DIAGNOSIS — L89153 Pressure ulcer of sacral region, stage 3: Secondary | ICD-10-CM | POA: Diagnosis not present

## 2023-02-09 DIAGNOSIS — M6281 Muscle weakness (generalized): Secondary | ICD-10-CM | POA: Diagnosis not present

## 2023-02-09 DIAGNOSIS — R239 Unspecified skin changes: Secondary | ICD-10-CM | POA: Diagnosis not present

## 2023-02-10 ENCOUNTER — Ambulatory Visit: Payer: Medicare Other | Admitting: Critical Care Medicine

## 2023-02-10 DIAGNOSIS — I509 Heart failure, unspecified: Secondary | ICD-10-CM | POA: Diagnosis not present

## 2023-02-10 DIAGNOSIS — Z7409 Other reduced mobility: Secondary | ICD-10-CM | POA: Diagnosis not present

## 2023-02-10 DIAGNOSIS — I4891 Unspecified atrial fibrillation: Secondary | ICD-10-CM | POA: Diagnosis not present

## 2023-02-10 DIAGNOSIS — I10 Essential (primary) hypertension: Secondary | ICD-10-CM | POA: Diagnosis not present

## 2023-02-10 NOTE — Progress Notes (Deleted)
Established Patient Office Visit  Subjective:  Patient ID: Jillian [name], female    DOB: 06-Nov-1936  Age: 87 y.o. MRN: [id number]  History of Present Illness:   CC: Posthospital visit   HPI 09/2021 Jillian [name] presents for post hospital visit which occurred in August of this year.  I have not seen this patient since June of this year.  Somehow we did not get her back in follow-up.  Patient lives at home with family support.  Transportation is a difficulty.  Note she is only having 1-2 bowel movements every 2 or 3 days.  She is out of her furosemide.  Below is a copy of the discharge summary from August   MRN: [id number] DOB: Feb 13, 1936 87 y.o. PCP: [name], MD   Date of Admission: 07/07/2021 10:14 AM Date of Discharge: 8/24/202208/24/2022 Attending Physician: Lucious Groves    Discharge Diagnosis: 1. Acute on chronic HFpEF exacerbation 2. Hypertension 3. Hx of CAD  4. CKD IV 5. Prediabetes  6. Hx of CVA    1.  Acute HFpEF exacerbation: Patient discharged on Lasix 60mg  daily and potassium supplementation 38mEq daily. Continued on coreg 25mg  bid. On follow up, please ensure compliance with current regimen. Follow up BMP    Hypertension: Continue telmisartan 40mg  daily.    Hx of CAD: Continue rosuvastatin 20mg  daily    CKD IV: Follow up with nephrology. Avoid nephrotoxic agents.    Prediabetes: HbA1c 6.2. Continue to encourage lifestyle modifications   Hx of CVA: Concern for possible embolic CVA in the past. Continue Eliquis 2.5mg  bid    2.  Labs / imaging needed at time of follow-up: BMP   3.  Pending labs/ test needing follow-up: None   It can rarely we get this patient in for laboratory and examination directly   03/08/22 This is a transition of care visit at day 14, RN transition of care visit had already occurred on April 13 Patient comes today accompanied by her granddaughter for posthospital visit.  Patient was admitted between the seventh and 11  April with acute bronchitis and congestive heart failure.  She had edema on her chest x-ray.  X-ray showed this.  She did not have any positive cultures.  She was diuresed given steroids and antibiotics.  She is finished her course of antibiotics.  Patient comes with her medication bag with her and I went through all the medications and had to do extensive medication reconciliation.  During the hospitalization they wanted to start her on BiDil but I do not see evidence for any of these prescriptions having been sent.  They asked her to hold her ARB which she has been doing but there is a bottle of this still in the medication bag.  Her creatinine had increased over 3 and she has follow-up with nephrology in the next 24 hours.  Patient was also supposed to be on hydralazine 50 mg 3 times daily she has been taking this also supposed to be on carvedilol and she is supposed to be taking this and does have medicines for this.  She also has furosemide and Imdur.  Patient states overall except for edema in the legs she is improving.  She declines to receive any vaccines at this visit. She is agreeable for labs and x-ray  Below is a copy of the discharge summary PCP: [name], MD   Admit date: 02/19/2022  Discharge date: 02/23/2022   Admitted From: Home   Disposition:  Home  Recommendations for Outpatient Follow-up:    Follow up with PCP in 1-2 weeks   PCP Please obtain BMP/CBC, 2 view CXR in 1week,  (see Discharge instructions)    PCP Please follow up on the following pending results: Please monitor CBC, BMP closely, needs close outpatient follow-up with her nephrologist.     Home Health: PT,OT   Equipment/Devices: as below  Consultations: None  Discharge Condition: Stable    CODE STATUS: Full    Diet Recommendation: Heart Healthy with 1.5 L fluid restriction per day.        Chief Complaint  Patient presents with   Shortness of Breath      Brief history of present illness  from the day of admission and additional interim summary     87 y.o. female with history of chronic combined systolic and diastolic CHF, paroxysmal atrial fibrillation, history of CVA, hypertension presents to the ER because of worsening shortness of breath with productive cough and wheezing over the last 1 week, she had some exposure to sick contacts in the last week.  In the ER her work-up was suggestive of acute bronchitis and acute on chronic diastolic CHF and she was admitted.                                                                  Hospital Course    Hypoxic respiratory failure requiring supplemental oxygen due to combination of acute bronchitis and acute on chronic diastolic CHF. She is currently admitted to the hospital, sputum Gram stain culture has been ordered, will trend procalcitonin levels as well, chest x-ray noted.  Currently on Levaquin along with steroids, clinically continues to improve has been encouraged to sit up in chair and use I-S and flutter valve for pulmonary toiletry which I think will make a huge difference, nursing staff also requested to monitor on 02/22/2022.   2.  Acute on chronic diastolic CHF EF 123456 in 123456.  Initially required some IV Lasix, continue Coreg, on ARB and switch to BiDil due to AKI and CKD.   3. AKI on CKD Stage IV.  ARB was held since admission, urine sodium under 10 suggestive dehydration continue IV fluids and monitor.  Acute renal ultrasound   4.  History of CVA.  Continue combination of statin and Eliquis for secondary prevention.   5.  Hypertensive urgency could have been due to shortness of breath, for now Coreg, BiDil and as needed hydralazine.   6.  History of paroxysmal atrial fibrillation with [place] vas 2 score of greater than 3.  On Coreg and Eliquis.  Will require Eliquis prescription upon discharge.   7.  Nonspecific chronic EKG changes.  Monitor clinically.     Discharge diagnosis       Principal Problem:   Acute  bronchitis Active Problems:   CKD (chronic kidney disease) stage 4, GFR 15-29 ml/min (HCC)   Anemia due to stage 4 chronic kidney disease (HCC)   Chronic diastolic heart failure (HCC)   History of CVA (cerebrovascular accident)  Patient does have follow-up with cardiology scheduled and notes she still on the Eliquis  02/10/23 Admit date: 12/29/2022 Discharge date: 12/30/2022   Admitted From: Home  Disposition:  Inpatient hospice   Recommendations for Outpatient Follow-up: As per  hospice provider   Discharge Condition:Serious  CODE STATUS:comfort care  Diet recommendation: as tolerated , regular diet    Discharge Summary: 87 year old with CKD stage IV, chronic diastolic heart failure, history of stroke, hypertension, paroxysmal A-fib on Eliquis who was recently admitted to the hospital and discharged on 1/23 with home hospice program due to severe untreatable medical conditions.  She was admitted to [facility] care hospice at home.  She does have adequate support system at home.  Family found that she was more tired and having trouble breathing for last few days so brought back to the emergency room.  In the emergency room she is on 4 L oxygen.  A-fib with RVR heart rate 135.  CT scan of the chest with multifocal consolidations, multiple hypoattenuation lesions on her liver.  Creatinine 3.63, hemoglobin 7.3, FOBT positive.  She has indwelling Foley catheter that was recently placed at home.  Reportedly she was just treated for UTI.  Patient was started on 1 unit of PRBC, she was started on vancomycin, Rocephin and azithromycin for multifocal pneumonia and admitted to the hospital.   2/15, overnight with increasing oxygen demand and more distress.  shared decision making, changed to comfort care.    Acute hypoxemic respiratory failure and acute respiratory distress due to multiple factors including Multifocal pneumonia and fluid overload secondary to renal disease. Multiple liver lesions most  likely metastatic disease, she is not a candidate for biopsy or contrasted studies.  A-fib with RVR: Difficulty to control rate.  Continue amiodarone maintenance dose.  Continue Eliquis as much as she can take it.  Also on metoprolol. CKD stage IV, worsening renal functions.   Goal of care: Patient currently remains with severe frailty and debility.  She remains with poor respiratory status and overnight needed nonrebreather.  Remains intermittently uncomfortable. Shared decision making was done, changing her to comfort care and hospice level of care.  Started on comfort care medications including low-dose of Dilaudid, Ativan as needed, oxygen, relief of stress and anxiety. Patient to transfer to inpatient hospice today to provide end-of-life care.   Medication management, Family desired to continue antibiotics.  She received 2 days of Rocephin and azithromycin.  Will continue Omnicef for additional 5 days as long as she can take it. She will continue to take amiodarone and metoprolol along with Eliquis until she can safely take by mouth.   Stable to transfer to hospice level of care.   Discharge Diagnoses:  Principal Problem:   Multifocal pneumonia Active Problems:   Acute renal failure superimposed on stage 4 chronic kidney disease (HCC)   Chronic diastolic heart failure (HCC)   History of CVA (cerebrovascular accident)   Benign essential hypertension   Paroxysmal atrial fibrillation with RVR (HCC)   Acute on chronic respiratory failure with hypoxia and hypercapnia (HCC)   Occult GI bleeding   Acute encephalopathy   Liver lesion      Past Medical History:  Diagnosis Date   Acute CVA (cerebrovascular accident) ([place]) 07/12/2020   Hypertension    Renal disorder    CKD Stage IV   Stroke [facility]     Past Surgical History:  Procedure Laterality Date   CARDIAC CATHETERIZATION  2009    Family History  Problem Relation Age of Onset   CAD Mother    Breast cancer Mother      Social History   Socioeconomic History   Marital status: Single    Spouse name: Not on file   Number of children: Not  on file   Years of education: Not on file   Highest education level: Not on file  Occupational History   Not on file  Tobacco Use   Smoking status: Never   Smokeless tobacco: Never  Substance and Sexual Activity   Alcohol use: Not on file   Drug use: Not on file   Sexual activity: Not on file  Other Topics Concern   Not on file  Social History Narrative   Not on file   Social Determinants of Health   Financial Resource Strain: Not on file  Food Insecurity: Not on file  Transportation Needs: Not on file  Physical Activity: Not on file  Stress: Not on file  Social Connections: Not on file  Intimate Partner Violence: Not on file    Outpatient Medications Prior to Visit  Medication Sig Dispense Refill   amiodarone (PACERONE) 200 MG tablet Take 1 tablet (200 mg total) by mouth daily. 30 tablet 0   amLODipine (NORVASC) 5 MG tablet Take 5 mg by mouth daily.     apixaban (ELIQUIS) 2.5 MG TABS tablet Take 1 tablet (2.5 mg total) by mouth 2 (two) times daily. 60 tablet 6   cholecalciferol (VITAMIN D3) 25 MCG (1000 UNIT) tablet Take 1,000 Units by mouth daily.     diphenhydrAMINE (BENADRYL) 25 mg capsule Take 25 mg by mouth every 6 (six) hours as needed for itching or allergies. (Patient not taking: Reported on 01/12/2023)     furosemide (LASIX) 40 MG tablet Take 1 tablet (40 mg total) by mouth daily. 30 tablet 0   haloperidol (HALDOL) 5 MG tablet Take 5 mg by mouth every 4 (four) hours as needed for agitation.     hydrALAZINE (APRESOLINE) 50 MG tablet Take 50 mg by mouth every 8 (eight) hours.     levalbuterol (XOPENEX) 1.25 MG/0.5ML nebulizer solution Take 1.25 mg by nebulization every 6 (six) hours as needed for wheezing or shortness of breath. 60 mL 0   LORazepam (ATIVAN) 0.5 MG tablet Take 0.5 mg by mouth every 4 (four) hours as needed for anxiety.      metoprolol tartrate (LOPRESSOR) 50 MG tablet Take 1 tablet (50 mg total) by mouth 2 (two) times daily. 60 tablet 0   polyethylene glycol (MIRALAX / GLYCOLAX) 17 g packet Take 17 g by mouth daily. 14 each 0   sennosides-docusate sodium (SENOKOT-S) 8.6-50 MG tablet Take 3 tablets by mouth at bedtime. (Patient taking differently: Take 3 tablets by mouth at bedtime as needed for constipation.) 60 tablet 4   No facility-administered medications prior to visit.    Allergies  Allergen Reactions   Other Other (See Comments)    "Tapes tears off my skin"   Latex Other (See Comments)    "Blisters my skin and tears it off"- skin burns, too   Penicillins Nausea Only   Tape Other (See Comments)    "Tapes tears off my skin"   Aspirin Other (See Comments)    Irritates the stomach   Atorvastatin Other (See Comments)    Caused fatigue   Diltiazem Hcl Nausea And Vomiting    ROS Review of Systems  Constitutional:  Negative for chills, diaphoresis and fever.  HENT:  Negative for congestion, hearing loss, nosebleeds, sore throat and tinnitus.   Eyes:  Negative for photophobia and redness.  Respiratory:  Negative for cough, shortness of breath, wheezing and stridor.   Cardiovascular:  Positive for leg swelling. Negative for chest pain and palpitations.  Gastrointestinal:  Negative  for abdominal pain, blood in stool, constipation, diarrhea, nausea and vomiting.  Endocrine: Negative for polydipsia.  Genitourinary:  Negative for dysuria, flank pain, frequency, hematuria and urgency.  Musculoskeletal:  Negative for back pain, myalgias and neck pain.  Skin:  Negative for rash.  Allergic/Immunologic: Negative for environmental allergies.  Neurological:  Negative for dizziness, tremors, seizures, weakness and headaches.  Hematological:  Does not bruise/bleed easily.  Psychiatric/Behavioral:  Negative for suicidal ideas. The patient is not nervous/anxious.       Objective:    Physical Exam Vitals  reviewed.  Constitutional:      Appearance: Normal appearance. She is well-developed. She is obese. She is not diaphoretic.  HENT:     Head: Normocephalic and atraumatic.     Nose: No nasal deformity, septal deviation, mucosal edema or rhinorrhea.     Right Sinus: No maxillary sinus tenderness or frontal sinus tenderness.     Left Sinus: No maxillary sinus tenderness or frontal sinus tenderness.     Mouth/Throat:     Pharynx: No oropharyngeal exudate.  Eyes:     General: No scleral icterus.    Conjunctiva/sclera: Conjunctivae normal.     Pupils: Pupils are equal, round, and reactive to light.  Neck:     Thyroid: No thyromegaly.     Vascular: No carotid bruit or JVD.     Trachea: Trachea normal. No tracheal tenderness or tracheal deviation.  Cardiovascular:     Rate and Rhythm: Normal rate and regular rhythm.     Chest Wall: PMI is not displaced.     Pulses: Normal pulses. No decreased pulses.     Heart sounds: Normal heart sounds, S1 normal and S2 normal. Heart sounds not distant. No murmur heard.    No systolic murmur is present.     No diastolic murmur is present.     No friction rub. No gallop. No S3 or S4 sounds.  Pulmonary:     Effort: Pulmonary effort is normal. No tachypnea, accessory muscle usage or respiratory distress.     Breath sounds: Normal breath sounds. No stridor. No decreased breath sounds, wheezing, rhonchi or rales.  Chest:     Chest wall: No tenderness.  Abdominal:     General: Bowel sounds are normal. There is no distension.     Palpations: Abdomen is soft. Abdomen is not rigid.     Tenderness: There is no abdominal tenderness. There is no guarding or rebound.  Musculoskeletal:        General: Normal range of motion.     Cervical back: Normal range of motion and neck supple. No edema, erythema or rigidity. No muscular tenderness. Normal range of motion.     Right lower leg: Edema present.     Left lower leg: Edema present.  Lymphadenopathy:     Head:      Right side of head: No submental or submandibular adenopathy.     Left side of head: No submental or submandibular adenopathy.     Cervical: No cervical adenopathy.  Skin:    General: Skin is warm and dry.     Coloration: Skin is not pale.     Findings: No rash.     Nails: There is no clubbing.  Neurological:     Mental Status: She is alert and oriented to person, place, and time.     Sensory: No sensory deficit.  Psychiatric:        Mood and Affect: Mood normal.  Speech: Speech normal.        Behavior: Behavior normal.        Thought Content: Thought content normal.    Telephone visit no exam There were no vitals taken for this visit. Wt Readings from Last 3 Encounters:  01/14/23 165 lb (74.8 kg)  01/12/23 185 lb (83.9 kg)  12/29/22 186 lb 1.1 oz (84.4 kg)     Health Maintenance Due  Topic Date Due   Medicare Annual Wellness (AWV)  Never done   DTaP/Tdap/Td (1 - Tdap) Never done   INFLUENZA VACCINE  Never done     There are no preventive care reminders to display for this patient.  Lab Results  Component Value Date   TSH 1.590 11/28/2022   Lab Results  Component Value Date   WBC 8.6 01/14/2023   HGB 9.7 (L) 01/14/2023   HCT 32.0 (L) 01/14/2023   MCV 90.1 01/14/2023   PLT 153 01/14/2023   Lab Results  Component Value Date   NA 141 01/14/2023   K 3.1 (L) 01/14/2023   CO2 31 01/14/2023   GLUCOSE 145 (H) 01/14/2023   BUN 17 01/14/2023   CREATININE 2.10 (H) 01/14/2023   BILITOT 0.9 01/14/2023   ALKPHOS 173 (H) 01/14/2023   AST 30 01/14/2023   ALT 65 (H) 01/14/2023   PROT 7.0 01/14/2023   ALBUMIN 2.4 (L) 01/14/2023   CALCIUM 8.5 (L) 01/14/2023   ANIONGAP 11 01/14/2023   EGFR 22 (L) 03/08/2022   Lab Results  Component Value Date   CHOL 231 (H) 11/18/2021   Lab Results  Component Value Date   HDL 40 11/18/2021   Lab Results  Component Value Date   LDLCALC 164 (H) 11/18/2021   Lab Results  Component Value Date   TRIG 149 11/18/2021    Lab Results  Component Value Date   CHOLHDL 5.8 (H) 11/18/2021   Lab Results  Component Value Date   HGBA1C 5.9 (H) 11/29/2022      Assessment & Plan:   Problem List Items Addressed This Visit   None No orders of the defined types were placed in this encounter.    Follow Up Instructions: Patient knows a direct exam will occur in the next 2 weeks   I discussed the assessment and treatment plan with the patient. The patient was provided an opportunity to ask questions and all were answered. The patient agreed with the plan and demonstrated an understanding of the instructions.   The patient was advised to call back or seek an in-person evaluation if the symptoms worsen or if the condition fails to improve as anticipated.  I provided 20 minutes of non-face-to-face time during this encounter  including  median intraservice time , review of notes, labs, imaging, medications  and explaining diagnosis and management to the patient .    Asencion Noble, MD

## 2023-02-11 DIAGNOSIS — E559 Vitamin D deficiency, unspecified: Secondary | ICD-10-CM | POA: Diagnosis not present

## 2023-02-11 DIAGNOSIS — I10 Essential (primary) hypertension: Secondary | ICD-10-CM | POA: Diagnosis not present

## 2023-02-11 DIAGNOSIS — F03911 Unspecified dementia, unspecified severity, with agitation: Secondary | ICD-10-CM | POA: Diagnosis not present

## 2023-02-11 DIAGNOSIS — M79601 Pain in right arm: Secondary | ICD-10-CM | POA: Diagnosis not present

## 2023-02-11 DIAGNOSIS — I4891 Unspecified atrial fibrillation: Secondary | ICD-10-CM | POA: Diagnosis not present

## 2023-02-11 DIAGNOSIS — I509 Heart failure, unspecified: Secondary | ICD-10-CM | POA: Diagnosis not present

## 2023-02-17 DIAGNOSIS — U071 COVID-19: Secondary | ICD-10-CM | POA: Diagnosis not present

## 2023-02-20 ENCOUNTER — Other Ambulatory Visit: Payer: Self-pay

## 2023-02-20 ENCOUNTER — Emergency Department (HOSPITAL_COMMUNITY): Payer: Medicare Other

## 2023-02-20 ENCOUNTER — Inpatient Hospital Stay (HOSPITAL_COMMUNITY)
Admission: EM | Admit: 2023-02-20 | Discharge: 2023-02-24 | DRG: 291 | Disposition: A | Discharge status: Skilled Nursing Facility | Payer: Medicare Other | Source: Skilled Nursing Facility | Attending: Internal Medicine | Admitting: Internal Medicine

## 2023-02-20 DIAGNOSIS — Z880 Allergy status to penicillin: Secondary | ICD-10-CM | POA: Diagnosis not present

## 2023-02-20 DIAGNOSIS — I4819 Other persistent atrial fibrillation: Secondary | ICD-10-CM | POA: Diagnosis present

## 2023-02-20 DIAGNOSIS — R1310 Dysphagia, unspecified: Secondary | ICD-10-CM | POA: Diagnosis not present

## 2023-02-20 DIAGNOSIS — D631 Anemia in chronic kidney disease: Secondary | ICD-10-CM | POA: Diagnosis not present

## 2023-02-20 DIAGNOSIS — I110 Hypertensive heart disease with heart failure: Secondary | ICD-10-CM | POA: Diagnosis not present

## 2023-02-20 DIAGNOSIS — Z8249 Family history of ischemic heart disease and other diseases of the circulatory system: Secondary | ICD-10-CM | POA: Diagnosis not present

## 2023-02-20 DIAGNOSIS — I509 Heart failure, unspecified: Principal | ICD-10-CM

## 2023-02-20 DIAGNOSIS — K769 Liver disease, unspecified: Secondary | ICD-10-CM | POA: Diagnosis not present

## 2023-02-20 DIAGNOSIS — R488 Other symbolic dysfunctions: Secondary | ICD-10-CM | POA: Diagnosis not present

## 2023-02-20 DIAGNOSIS — N184 Chronic kidney disease, stage 4 (severe): Secondary | ICD-10-CM | POA: Diagnosis not present

## 2023-02-20 DIAGNOSIS — R279 Unspecified lack of coordination: Secondary | ICD-10-CM | POA: Diagnosis not present

## 2023-02-20 DIAGNOSIS — E869 Volume depletion, unspecified: Secondary | ICD-10-CM | POA: Diagnosis not present

## 2023-02-20 DIAGNOSIS — I252 Old myocardial infarction: Secondary | ICD-10-CM | POA: Diagnosis not present

## 2023-02-20 DIAGNOSIS — I499 Cardiac arrhythmia, unspecified: Secondary | ICD-10-CM | POA: Diagnosis not present

## 2023-02-20 DIAGNOSIS — J9621 Acute and chronic respiratory failure with hypoxia: Secondary | ICD-10-CM | POA: Diagnosis present

## 2023-02-20 DIAGNOSIS — E876 Hypokalemia: Secondary | ICD-10-CM | POA: Diagnosis present

## 2023-02-20 DIAGNOSIS — M79601 Pain in right arm: Secondary | ICD-10-CM | POA: Diagnosis not present

## 2023-02-20 DIAGNOSIS — J9620 Acute and chronic respiratory failure, unspecified whether with hypoxia or hypercapnia: Secondary | ICD-10-CM | POA: Diagnosis not present

## 2023-02-20 DIAGNOSIS — Z91048 Other nonmedicinal substance allergy status: Secondary | ICD-10-CM

## 2023-02-20 DIAGNOSIS — I480 Paroxysmal atrial fibrillation: Secondary | ICD-10-CM | POA: Diagnosis not present

## 2023-02-20 DIAGNOSIS — Z79899 Other long term (current) drug therapy: Secondary | ICD-10-CM

## 2023-02-20 DIAGNOSIS — Z886 Allergy status to analgesic agent status: Secondary | ICD-10-CM | POA: Diagnosis not present

## 2023-02-20 DIAGNOSIS — Z66 Do not resuscitate: Secondary | ICD-10-CM | POA: Diagnosis present

## 2023-02-20 DIAGNOSIS — I5023 Acute on chronic systolic (congestive) heart failure: Secondary | ICD-10-CM | POA: Diagnosis not present

## 2023-02-20 DIAGNOSIS — Z91040 Latex allergy status: Secondary | ICD-10-CM

## 2023-02-20 DIAGNOSIS — F0390 Unspecified dementia without behavioral disturbance: Secondary | ICD-10-CM | POA: Diagnosis present

## 2023-02-20 DIAGNOSIS — Z7901 Long term (current) use of anticoagulants: Secondary | ICD-10-CM | POA: Diagnosis not present

## 2023-02-20 DIAGNOSIS — I5033 Acute on chronic diastolic (congestive) heart failure: Secondary | ICD-10-CM | POA: Diagnosis not present

## 2023-02-20 DIAGNOSIS — Z743 Need for continuous supervision: Secondary | ICD-10-CM | POA: Diagnosis not present

## 2023-02-20 DIAGNOSIS — R6889 Other general symptoms and signs: Secondary | ICD-10-CM | POA: Diagnosis not present

## 2023-02-20 DIAGNOSIS — Z888 Allergy status to other drugs, medicaments and biological substances status: Secondary | ICD-10-CM

## 2023-02-20 DIAGNOSIS — R0600 Dyspnea, unspecified: Secondary | ICD-10-CM | POA: Diagnosis not present

## 2023-02-20 DIAGNOSIS — I130 Hypertensive heart and chronic kidney disease with heart failure and stage 1 through stage 4 chronic kidney disease, or unspecified chronic kidney disease: Secondary | ICD-10-CM | POA: Diagnosis not present

## 2023-02-20 DIAGNOSIS — R531 Weakness: Secondary | ICD-10-CM | POA: Diagnosis not present

## 2023-02-20 DIAGNOSIS — R1312 Dysphagia, oropharyngeal phase: Secondary | ICD-10-CM | POA: Diagnosis not present

## 2023-02-20 DIAGNOSIS — Z8673 Personal history of transient ischemic attack (TIA), and cerebral infarction without residual deficits: Secondary | ICD-10-CM

## 2023-02-20 DIAGNOSIS — Z7401 Bed confinement status: Secondary | ICD-10-CM | POA: Diagnosis not present

## 2023-02-20 DIAGNOSIS — R0689 Other abnormalities of breathing: Secondary | ICD-10-CM | POA: Diagnosis not present

## 2023-02-20 DIAGNOSIS — U071 COVID-19: Secondary | ICD-10-CM | POA: Diagnosis present

## 2023-02-20 DIAGNOSIS — R296 Repeated falls: Secondary | ICD-10-CM | POA: Diagnosis not present

## 2023-02-20 DIAGNOSIS — M79672 Pain in left foot: Secondary | ICD-10-CM | POA: Diagnosis not present

## 2023-02-20 DIAGNOSIS — I4891 Unspecified atrial fibrillation: Secondary | ICD-10-CM | POA: Diagnosis not present

## 2023-02-20 DIAGNOSIS — F03911 Unspecified dementia, unspecified severity, with agitation: Secondary | ICD-10-CM | POA: Diagnosis not present

## 2023-02-20 DIAGNOSIS — Z803 Family history of malignant neoplasm of breast: Secondary | ICD-10-CM

## 2023-02-20 DIAGNOSIS — M79671 Pain in right foot: Secondary | ICD-10-CM | POA: Diagnosis not present

## 2023-02-20 DIAGNOSIS — J9610 Chronic respiratory failure, unspecified whether with hypoxia or hypercapnia: Secondary | ICD-10-CM | POA: Diagnosis not present

## 2023-02-20 DIAGNOSIS — M6281 Muscle weakness (generalized): Secondary | ICD-10-CM | POA: Diagnosis not present

## 2023-02-20 DIAGNOSIS — I2489 Other forms of acute ischemic heart disease: Secondary | ICD-10-CM | POA: Diagnosis not present

## 2023-02-20 DIAGNOSIS — E559 Vitamin D deficiency, unspecified: Secondary | ICD-10-CM | POA: Diagnosis not present

## 2023-02-20 DIAGNOSIS — I10 Essential (primary) hypertension: Secondary | ICD-10-CM | POA: Diagnosis not present

## 2023-02-20 DIAGNOSIS — I5032 Chronic diastolic (congestive) heart failure: Secondary | ICD-10-CM | POA: Diagnosis not present

## 2023-02-20 LAB — URINALYSIS, ROUTINE W REFLEX MICROSCOPIC
Bilirubin Urine: NEGATIVE
Glucose, UA: NEGATIVE mg/dL
Hgb urine dipstick: NEGATIVE
Ketones, ur: NEGATIVE mg/dL
Leukocytes,Ua: NEGATIVE
Nitrite: NEGATIVE
Protein, ur: >=300 mg/dL — AB
Specific Gravity, Urine: 1.01 (ref 1.005–1.030)
pH: 7 (ref 5.0–8.0)

## 2023-02-20 LAB — CBC WITH DIFFERENTIAL/PLATELET
Abs Immature Granulocytes: 0.03 10*3/uL (ref 0.00–0.07)
Basophils Absolute: 0 10*3/uL (ref 0.0–0.1)
Basophils Relative: 0 %
Eosinophils Absolute: 0 10*3/uL (ref 0.0–0.5)
Eosinophils Relative: 0 %
HCT: 31.1 % — ABNORMAL LOW (ref 36.0–46.0)
Hemoglobin: 9.6 g/dL — ABNORMAL LOW (ref 12.0–15.0)
Immature Granulocytes: 1 %
Lymphocytes Relative: 36 %
Lymphs Abs: 1.9 10*3/uL (ref 0.7–4.0)
MCH: 26.9 pg (ref 26.0–34.0)
MCHC: 30.9 g/dL (ref 30.0–36.0)
MCV: 87.1 fL (ref 80.0–100.0)
Monocytes Absolute: 0.5 10*3/uL (ref 0.1–1.0)
Monocytes Relative: 10 %
Neutro Abs: 2.8 10*3/uL (ref 1.7–7.7)
Neutrophils Relative %: 53 %
Platelets: 190 10*3/uL (ref 150–400)
RBC: 3.57 MIL/uL — ABNORMAL LOW (ref 3.87–5.11)
RDW: 17.2 % — ABNORMAL HIGH (ref 11.5–15.5)
WBC: 5.2 10*3/uL (ref 4.0–10.5)
nRBC: 0 % (ref 0.0–0.2)

## 2023-02-20 LAB — I-STAT VENOUS BLOOD GAS, ED
Acid-Base Excess: 2 mmol/L (ref 0.0–2.0)
Bicarbonate: 25 mmol/L (ref 20.0–28.0)
Calcium, Ion: 1.04 mmol/L — ABNORMAL LOW (ref 1.15–1.40)
HCT: 32 % — ABNORMAL LOW (ref 36.0–46.0)
Hemoglobin: 10.9 g/dL — ABNORMAL LOW (ref 12.0–15.0)
O2 Saturation: 99 %
Potassium: 3.4 mmol/L — ABNORMAL LOW (ref 3.5–5.1)
Sodium: 139 mmol/L (ref 135–145)
TCO2: 26 mmol/L (ref 22–32)
pCO2, Ven: 31.2 mmHg — ABNORMAL LOW (ref 44–60)
pH, Ven: 7.512 — ABNORMAL HIGH (ref 7.25–7.43)
pO2, Ven: 124 mmHg — ABNORMAL HIGH (ref 32–45)

## 2023-02-20 LAB — COMPREHENSIVE METABOLIC PANEL
ALT: 15 U/L (ref 0–44)
AST: 25 U/L (ref 15–41)
Albumin: 2.6 g/dL — ABNORMAL LOW (ref 3.5–5.0)
Alkaline Phosphatase: 145 U/L — ABNORMAL HIGH (ref 38–126)
Anion gap: 11 (ref 5–15)
BUN: 20 mg/dL (ref 8–23)
CO2: 25 mmol/L (ref 22–32)
Calcium: 8.3 mg/dL — ABNORMAL LOW (ref 8.9–10.3)
Chloride: 101 mmol/L (ref 98–111)
Creatinine, Ser: 2.41 mg/dL — ABNORMAL HIGH (ref 0.44–1.00)
GFR, Estimated: 19 mL/min — ABNORMAL LOW (ref >60)
Glucose, Bld: 108 mg/dL — ABNORMAL HIGH (ref 70–99)
Potassium: 3.4 mmol/L — ABNORMAL LOW (ref 3.5–5.1)
Sodium: 137 mmol/L (ref 135–145)
Total Bilirubin: 0.9 mg/dL (ref 0.3–1.2)
Total Protein: 6.6 g/dL (ref 6.5–8.1)

## 2023-02-20 LAB — BLOOD GAS, VENOUS
Acid-Base Excess: 5.8 mmol/L — ABNORMAL HIGH (ref 0.0–2.0)
Bicarbonate: 30.6 mmol/L — ABNORMAL HIGH (ref 20.0–28.0)
O2 Saturation: 90.8 %
Patient temperature: 36.6
pCO2, Ven: 43 mmHg — ABNORMAL LOW (ref 44–60)
pH, Ven: 7.46 — ABNORMAL HIGH (ref 7.25–7.43)
pO2, Ven: 55 mmHg — ABNORMAL HIGH (ref 32–45)

## 2023-02-20 LAB — TROPONIN I (HIGH SENSITIVITY)
Troponin I (High Sensitivity): 56 ng/L — ABNORMAL HIGH (ref <18)
Troponin I (High Sensitivity): 58 ng/L — ABNORMAL HIGH (ref <18)

## 2023-02-20 LAB — MAGNESIUM: Magnesium: 1.5 mg/dL — ABNORMAL LOW (ref 1.7–2.4)

## 2023-02-20 LAB — SARS CORONAVIRUS 2 BY RT PCR: SARS Coronavirus 2 by RT PCR: POSITIVE — AB

## 2023-02-20 LAB — BRAIN NATRIURETIC PEPTIDE: B Natriuretic Peptide: 2009.3 pg/mL — ABNORMAL HIGH (ref 0.0–100.0)

## 2023-02-20 MED ORDER — DILTIAZEM HCL-DEXTROSE 125-5 MG/125ML-% IV SOLN (PREMIX)
5.0000 mg/h | INTRAVENOUS | Status: DC
  Administered 2023-02-20: 5 mg/h via INTRAVENOUS
  Filled 2023-02-20: qty 125

## 2023-02-20 MED ORDER — DILTIAZEM LOAD VIA INFUSION
15.0000 mg | Freq: Once | INTRAVENOUS | Status: AC
  Administered 2023-02-20: 15 mg via INTRAVENOUS
  Filled 2023-02-20: qty 15

## 2023-02-20 MED ORDER — FUROSEMIDE 10 MG/ML IJ SOLN
40.0000 mg | Freq: Once | INTRAMUSCULAR | Status: AC
  Administered 2023-02-20: 40 mg via INTRAVENOUS
  Filled 2023-02-20: qty 4

## 2023-02-20 MED ORDER — ACETAMINOPHEN 650 MG RE SUPP: 650.0000 mg | Freq: Four times a day (QID) | RECTAL | Status: DC | PRN

## 2023-02-20 MED ORDER — AMLODIPINE BESYLATE 5 MG PO TABS
5.0000 mg | ORAL_TABLET | Freq: Every day | ORAL | Status: DC
  Administered 2023-02-21 – 2023-02-22 (×2): 5 mg via ORAL
  Filled 2023-02-20 (×2): qty 1

## 2023-02-20 MED ORDER — IPRATROPIUM-ALBUTEROL 0.5-2.5 (3) MG/3ML IN SOLN
3.0000 mL | Freq: Once | RESPIRATORY_TRACT | Status: AC
  Administered 2023-02-20: 3 mL via RESPIRATORY_TRACT
  Filled 2023-02-20: qty 3

## 2023-02-20 MED ORDER — MAGNESIUM SULFATE 2 GM/50ML IV SOLN
2.0000 g | Freq: Once | INTRAVENOUS | Status: AC
  Administered 2023-02-21: 2 g via INTRAVENOUS
  Filled 2023-02-20: qty 50

## 2023-02-20 MED ORDER — METOPROLOL TARTRATE 5 MG/5ML IV SOLN
5.0000 mg | Freq: Once | INTRAVENOUS | Status: AC
  Administered 2023-02-20: 5 mg via INTRAVENOUS
  Filled 2023-02-20: qty 5

## 2023-02-20 MED ORDER — METOPROLOL TARTRATE 5 MG/5ML IV SOLN
10.0000 mg | Freq: Once | INTRAVENOUS | Status: AC
  Administered 2023-02-20: 10 mg via INTRAVENOUS
  Filled 2023-02-20: qty 10

## 2023-02-20 MED ORDER — SENNOSIDES-DOCUSATE SODIUM 8.6-50 MG PO TABS: 1.0000 | ORAL_TABLET | Freq: Every evening | ORAL | Status: DC | PRN

## 2023-02-20 MED ORDER — AMIODARONE HCL 200 MG PO TABS
200.0000 mg | ORAL_TABLET | Freq: Every day | ORAL | Status: DC
  Administered 2023-02-21 – 2023-02-24 (×4): 200 mg via ORAL
  Filled 2023-02-20 (×4): qty 1

## 2023-02-20 MED ORDER — HALOPERIDOL 5 MG PO TABS: 5.0000 mg | ORAL_TABLET | ORAL | Status: DC | PRN

## 2023-02-20 MED ORDER — IPRATROPIUM-ALBUTEROL 0.5-2.5 (3) MG/3ML IN SOLN
3.0000 mL | RESPIRATORY_TRACT | Status: DC | PRN
  Administered 2023-02-24: 3 mL via RESPIRATORY_TRACT
  Filled 2023-02-20: qty 3

## 2023-02-20 MED ORDER — LORAZEPAM 0.5 MG PO TABS
0.5000 mg | ORAL_TABLET | ORAL | Status: DC | PRN
  Filled 2023-02-20: qty 1

## 2023-02-20 MED ORDER — ACETAMINOPHEN 325 MG PO TABS: 650.0000 mg | ORAL_TABLET | Freq: Four times a day (QID) | ORAL | Status: DC | PRN

## 2023-02-20 MED ORDER — METOPROLOL TARTRATE 50 MG PO TABS
50.0000 mg | ORAL_TABLET | Freq: Two times a day (BID) | ORAL | Status: DC
  Administered 2023-02-20 – 2023-02-21 (×3): 50 mg via ORAL
  Filled 2023-02-20 (×3): qty 1

## 2023-02-20 MED ORDER — APIXABAN 2.5 MG PO TABS
2.5000 mg | ORAL_TABLET | Freq: Two times a day (BID) | ORAL | Status: DC
  Administered 2023-02-20 – 2023-02-24 (×8): 2.5 mg via ORAL
  Filled 2023-02-20 (×8): qty 1

## 2023-02-20 MED ORDER — AMIODARONE HCL 200 MG PO TABS: 200.0000 mg | ORAL_TABLET | Freq: Every day | ORAL | Status: DC

## 2023-02-20 MED ORDER — AMLODIPINE BESYLATE 5 MG PO TABS: 5.0000 mg | ORAL_TABLET | Freq: Every day | ORAL | Status: DC

## 2023-02-20 MED ORDER — METOPROLOL TARTRATE 5 MG/5ML IV SOLN
5.0000 mg | Freq: Once | INTRAVENOUS | Status: DC
  Filled 2023-02-20: qty 5

## 2023-02-20 NOTE — Progress Notes (Signed)
   02/20/23 2000  Assess: MEWS Score  Temp 98.7 F (37.1 C)  BP (!) 161/100  MAP (mmHg) 118  Pulse Rate (!) 114  ECG Heart Rate (!) 121  Resp (!) 21  SpO2 100 %  O2 Device Room Air  Assess: MEWS Score  MEWS Temp 0  MEWS Systolic 0  MEWS Pulse 2  MEWS RR 1  MEWS LOC 0  MEWS Score 3  MEWS Score Color Yellow  Assess: if the MEWS score is Yellow or Red  Were vital signs taken at a resting state? Yes  Focused Assessment No change from prior assessment  Does the patient meet 2 or more of the SIRS criteria? No  MEWS guidelines implemented  Yes, yellow  Treat  MEWS Interventions Considered administering scheduled or prn medications/treatments as ordered  Take Vital Signs  Increase Vital Sign Frequency  Yellow: Q2hr x1, continue Q4hrs until patient remains green for 12hrs  Escalate  MEWS: Escalate Yellow: Discuss with charge nurse and consider notifying provider and/or RRT  Notify: Charge Nurse/RN  Name of Charge Nurse/RN Notified Fred, RN  Provider Notification  Provider Name/Title Rocky Morel, DO  Date Provider Notified 02/20/23  Time Provider Notified 2000  Method of Notification Face-to-face  Provider response At bedside  Date of Provider Response 02/20/23  Time of Provider Response 2000  Assess: SIRS CRITERIA  SIRS Temperature  0  SIRS Pulse 1  SIRS Respirations  1  SIRS WBC 0  SIRS Score Sum  2

## 2023-02-20 NOTE — ED Notes (Signed)
ED TO INPATIENT HANDOFF REPORT  ED Nurse Name and Phone #:  [phone]  S Name/Age/Gender Jillian [name] 87 y.o. female Room/Bed: 030C/030C  Code Status   Code Status: DNR  Home/SNF/Other Skilled nursing facility Patient oriented to: self and place Is this baseline? Yes   Triage Complete: Triage complete  Chief Complaint Acute on chronic heart failure with preserved ejection fraction (HFpEF) [I50.33]  Triage Note Pt coming from [facility] with shob and weakness. Recently diagnosed with covid 3 days ago. Has hx of chronic respiratory failure, HTN, A-fib, 2 lpm via [place] chronic. Hx of dementia, DNR, hospice. Pt is tachypneic, warm to touch, afib rvr. Pt is currently a&o to baseline. Pt currently denies cp.    Allergies Allergies  Allergen Reactions   Other Other (See Comments)    "Tapes tears off my skin"   Latex Other (See Comments)    "Blisters my skin and tears it off"- skin burns, too   Penicillins Nausea Only   Tape Other (See Comments)    "Tapes tears off my skin"   Aspirin Other (See Comments)    Irritates the stomach   Atorvastatin Other (See Comments)    Caused fatigue   Diltiazem Hcl Nausea And Vomiting    Level of Care/Admitting Diagnosis ED Disposition     ED Disposition  Admit   Condition  --   Comment  Hospital Area: [facility] [[id number]]  Level of Care: Telemetry Medical [104]  May place patient in observation at [facility] or [facility] if equivalent level of care is available:: No  Covid Evaluation: Confirmed COVID Positive  Diagnosis: Acute on chronic heart failure with preserved ejection fraction (HFpEF) [[id number]]  Admitting Physician: Reymundo Poll [[id number]]  Attending Physician: Reymundo Poll [[id number]]          B Medical/Surgery History Past Medical History:  Diagnosis Date   Acute CVA (cerebrovascular accident) (HCC) 07/12/2020   Hypertension    Renal disorder    CKD Stage IV   Stroke [facility])    Past  Surgical History:  Procedure Laterality Date   CARDIAC CATHETERIZATION  2009     A IV Location/Drains/Wounds Patient Lines/Drains/Airways Status     Active Line/Drains/Airways     Name Placement date Placement time Site Days   Peripheral IV 02/20/23 20 G Anterior;Right Forearm 02/20/23  1333  Forearm  less than 1   External Urinary Catheter 02/20/23  1302  --  less than 1   Pressure Injury 01/16/23 Sacrum Lower Stage 2 -  Partial thickness loss of dermis presenting as a shallow open injury with a red, pink wound bed without slough. 01/16/23  0646  -- 35            Intake/Output Last 24 hours No intake or output data in the 24 hours ending 02/20/23 1835  Labs/Imaging Results for orders placed or performed during the hospital encounter of 02/20/23 (from the past 48 hour(s))  Comprehensive metabolic panel     Status: Abnormal   Collection Time: 02/20/23 12:13 PM  Result Value Ref Range   Sodium 137 135 - 145 mmol/L   Potassium 3.4 (L) 3.5 - 5.1 mmol/L   Chloride 101 98 - 111 mmol/L   CO2 25 22 - 32 mmol/L   Glucose, Bld 108 (H) 70 - 99 mg/dL    Comment: Glucose reference range applies only to samples taken after fasting for at least 8 hours.   BUN 20 8 - 23 mg/dL   Creatinine,  Ser 2.41 (H) 0.44 - 1.00 mg/dL   Calcium 8.3 (L) 8.9 - 10.3 mg/dL   Total Protein 6.6 6.5 - 8.1 g/dL   Albumin 2.6 (L) 3.5 - 5.0 g/dL   AST 25 15 - 41 U/L   ALT 15 0 - 44 U/L   Alkaline Phosphatase 145 (H) 38 - 126 U/L   Total Bilirubin 0.9 0.3 - 1.2 mg/dL   GFR, Estimated 19 (L) >60 mL/min    Comment: (NOTE) Calculated using the CKD-EPI Creatinine Equation (2021)    Anion gap 11 5 - 15    Comment: Performed at [facility], [address] [place], [place] [address]  Troponin I (High Sensitivity)     Status: Abnormal   Collection Time: 02/20/23 12:13 PM  Result Value Ref Range   Troponin I (High Sensitivity) 56 (H) <18 ng/L    Comment: (NOTE) Elevated high sensitivity troponin I  (hsTnI) values and significant  changes across serial measurements may suggest ACS but many other  chronic and acute conditions are known to elevate hsTnI results.  Refer to the "Links" section for chest pain algorithms and additional  guidance. Performed at [facility], [address] [place], [place] [address]   CBC with Differential     Status: Abnormal   Collection Time: 02/20/23 12:13 PM  Result Value Ref Range   WBC 5.2 4.0 - 10.5 K/uL   RBC 3.57 (L) 3.87 - 5.11 MIL/uL   Hemoglobin 9.6 (L) 12.0 - 15.0 g/dL   HCT 08.631.1 (L) 57.836.0 - 46.946.0 %   MCV 87.1 80.0 - 100.0 fL   MCH 26.9 26.0 - 34.0 pg   MCHC 30.9 30.0 - 36.0 g/dL   RDW 62.917.2 (H) 52.811.5 - 41.315.5 %   Platelets 190 150 - 400 K/uL   nRBC 0.0 0.0 - 0.2 %   Neutrophils Relative % 53 %   Neutro Abs 2.8 1.7 - 7.7 K/uL   Lymphocytes Relative 36 %   Lymphs Abs 1.9 0.7 - 4.0 K/uL   Monocytes Relative 10 %   Monocytes Absolute 0.5 0.1 - 1.0 K/uL   Eosinophils Relative 0 %   Eosinophils Absolute 0.0 0.0 - 0.5 K/uL   Basophils Relative 0 %   Basophils Absolute 0.0 0.0 - 0.1 K/uL   Immature Granulocytes 1 %   Abs Immature Granulocytes 0.03 0.00 - 0.07 K/uL    Comment: Performed at [facility], [address] [place], [place] [address]  Brain natriuretic peptide     Status: Abnormal   Collection Time: 02/20/23 12:13 PM  Result Value Ref Range   B Natriuretic Peptide 2,009.3 (H) 0.0 - 100.0 pg/mL    Comment: Performed at [facility], [address] [place], [place] [address]  I-Stat venous blood gas, ED     Status: Abnormal   Collection Time: 02/20/23 12:19 PM  Result Value Ref Range   pH, Ven 7.512 (H) 7.25 - 7.43   pCO2, Ven 31.2 (L) 44 - 60 mmHg   pO2, Ven 124 (H) 32 - 45 mmHg   Bicarbonate 25.0 20.0 - 28.0 mmol/L   TCO2 26 22 - 32 mmol/L   O2 Saturation 99 %   Acid-Base Excess 2.0 0.0 - 2.0 mmol/L   Sodium 139 135 - 145 mmol/L   Potassium 3.4 (L) 3.5 - 5.1 mmol/L   Calcium, Ion 1.04 (L) 1.15 - 1.40  mmol/L   HCT 32.0 (L) 36.0 - 46.0 %  Hemoglobin 10.9 (L) 12.0 - 15.0 g/dL   Sample type VENOUS   SARS Coronavirus 2 by RT PCR (hospital order, performed in [facility] hospital lab) *cepheid single result test* Anterior Nasal Swab     Status: Abnormal   Collection Time: 02/20/23 12:20 PM   Specimen: Anterior Nasal Swab  Result Value Ref Range   SARS Coronavirus 2 by RT PCR POSITIVE (A) NEGATIVE    Comment: Performed at [facility], [address] [place], [place] [address]  Troponin I (High Sensitivity)     Status: Abnormal   Collection Time: 02/20/23  1:43 PM  Result Value Ref Range   Troponin I (High Sensitivity) 58 (H) <18 ng/L    Comment: (NOTE) Elevated high sensitivity troponin I (hsTnI) values and significant  changes across serial measurements may suggest ACS but many other  chronic and acute conditions are known to elevate hsTnI results.  Refer to the "Links" section for chest pain algorithms and additional  guidance. Performed at [facility], [address], [place], [place] [address]   Urinalysis, Routine w reflex microscopic -Urine, Clean Catch     Status: Abnormal   Collection Time: 02/20/23  3:30 PM  Result Value Ref Range   Color, Urine YELLOW YELLOW   APPearance CLEAR CLEAR   Specific Gravity, Urine 1.010 1.005 - 1.030   pH 7.0 5.0 - 8.0   Glucose, UA NEGATIVE NEGATIVE mg/dL   Hgb urine dipstick NEGATIVE NEGATIVE   Bilirubin Urine NEGATIVE NEGATIVE   Ketones, ur NEGATIVE NEGATIVE mg/dL   Protein, ur >=244 (A) NEGATIVE mg/dL   Nitrite NEGATIVE NEGATIVE   Leukocytes,Ua NEGATIVE NEGATIVE   RBC / HPF 0-5 0 - 5 RBC/hpf   WBC, UA 0-5 0 - 5 WBC/hpf   Bacteria, UA RARE (A) NONE SEEN   Squamous Epithelial / HPF 0-5 0 - 5 /HPF    Comment: Performed at [facility], [address] [place], [place] [address]   DG Chest Portable 1 View  Result Date: 02/20/2023 CLINICAL DATA:  Dyspnea EXAM: PORTABLE CHEST 1 VIEW COMPARISON:  Previous studies  including the examination of 01/14/2023 FINDINGS: Transverse diameter of heart is increased. Central pulmonary vessels are prominent. There are no signs of alveolar pulmonary edema. No new focal infiltrates are seen. There is no significant pleural effusion or pneumothorax. Left hemidiaphragm is elevated. IMPRESSION: Cardiomegaly. There are no signs of alveolar pulmonary edema or new focal pulmonary consolidation. Electronically Signed   By: Ernie Avena M.D.   On: 02/20/2023 12:44    Pending Labs Unresulted Labs (From admission, onward)     Start     Ordered   02/21/23 0500  Basic metabolic panel  Tomorrow morning,   R        02/20/23 1820   02/21/23 0500  CBC  Tomorrow morning,   R        02/20/23 1820   02/20/23 1149  Blood gas, venous (at [facility])  Once,   R        02/20/23 1148            Vitals/Pain Today's Vitals   02/20/23 1540 02/20/23 1630 02/20/23 1700 02/20/23 1800  BP:  (!) 146/93 126/86 (!) 133/102  Pulse:  (!) 134 (!) 134 (!) 142  Resp:  (!) 28 (!) 26 (!) 26  Temp: 98.8 F (37.1 C)     TempSrc: Oral     SpO2:  100% 100% 100%  PainSc:  Isolation Precautions Airborne and Contact precautions  Medications Medications  acetaminophen (TYLENOL) tablet 650 mg (has no administration in time range)    Or  acetaminophen (TYLENOL) suppository 650 mg (has no administration in time range)  apixaban (ELIQUIS) tablet 2.5 mg (has no administration in time range)  haloperidol (HALDOL) tablet 5 mg (has no administration in time range)  LORazepam (ATIVAN) tablet 0.5 mg (has no administration in time range)  amiodarone (PACERONE) tablet 200 mg (has no administration in time range)  amLODipine (NORVASC) tablet 5 mg (has no administration in time range)  ipratropium-albuterol (DUONEB) 0.5-2.5 (3) MG/3ML nebulizer solution 3 mL (3 mLs Nebulization Given 02/20/23 1226)  metoprolol tartrate (LOPRESSOR) injection 5 mg (5 mg Intravenous Given 02/20/23 1357)  furosemide  (LASIX) injection 40 mg (40 mg Intravenous Given 02/20/23 1624)    Mobility non-ambulatory     Focused Assessments Cardiac Assessment Handoff:  Cardiac Rhythm: Atrial fibrillation No results found for: "CKTOTAL", "CKMB", "CKMBINDEX", "TROPONINI" No results found for: "DDIMER" Does the Patient currently have chest pain? No   , Pulmonary Assessment Handoff:  Lung sounds: Bilateral Breath Sounds: Rhonchi L Breath Sounds: Rhonchi R Breath Sounds: Rhonchi O2 Device: Nasal Cannula O2 Flow Rate (L/min): 2 L/min    R Recommendations: See Admitting Provider Note  Report given to:   Additional Notes:  Pt has a PW, and brief on. Covid +. 2 lpm chronic

## 2023-02-20 NOTE — ED Triage Notes (Signed)
Pt coming from [facility] with shob and weakness. Recently diagnosed with covid 3 days ago. Has hx of chronic respiratory failure, HTN, A-fib, 2 lpm via [place] chronic. Hx of dementia, DNR, hospice. Pt is tachypneic, warm to touch, afib rvr. Pt is currently a&o to baseline. Pt currently denies cp.

## 2023-02-20 NOTE — H&P (Cosign Needed Addendum)
Date: 02/20/2023               Patient Name:  Jillian [name] MRN: [id number]  DOB: [birth year] Age / Sex: 87 y.o., female   PCP: Storm FriskWright, Patrick E, MD         Medical Service: Internal Medicine Teaching Service         Attending Physician: Dr. Reymundo PollGuilloud, Carolyn, MD      First Contact: Dr. Lajuana Ripplean Harper, MD Pager [phone]    Second Contact: Dr. Champ MungoEmily Dean, DO Pager [phone]         After Hours (After 5p/  First Contact Pager: [phone]  weekends / holidays): Second Contact Pager: [phone]   SUBJECTIVE   Chief Complaint: shortness of breath  History of Present Illness: Jillian [name] is a 87 y.o. female with PMH of HFpEF (EF 55%), chronic respiratory failure on 2L [place], HTN, Hx of CVA, Afib on Eliquis, CKD IV, and dementia presents from [facility] for shortness of breath for 2 weeks.   Patient states symptoms started about 2 weeks ago. Dyspnea with exertion. Endorses productive cough with phlegm/mucous and nasal congestion. Had chills but no fever. Noticed bilateral leg swelling that is new since at [facility]. Endorses orthopnea which is new. Denies any chest pain, abdominal pain, n/v/d or palpitations. She was diagnosed with COVID about 3 days ago and [facility] 4/4.   She currently is at [facility]. Was hospitalized in February and January and was on hospice care but prior ED visit stated she does not meet criteria to return to residential hospice. She states she was living with daughter and grandson.   ED Course: Arrived by EMS.  -Vitals showed BP 159/109, tachycardic 120s, tachypneic 20-30s but afebrile. On home 2L [place] with SpO2 100%.  -Labs notable for K 3.4, creatinine 2.41, BNP 2009. Troponin elevated but flat 56-58. VBG 7.512/31/124/25. CBC without leukocytosis and Hgb about baseline.  -COVID positive.  -CXR showed cardiomegaly without focal consolidation or significant pleural effusion.  -Received IV lasix 40 mg x1 in ED. IMTS asked to admit.   Past Medical  History -HFpEF (EF 55%) -chronic respiratory failure on 2L [place] -Hx of CVA -CKD IV -Afib on Eliquis -HTN -dementia   Meds:  -Amiodarone 200 mg daily -Amlodipine 5 mg daily  -Eliquis 2.5 mg bid -Cholecalciferol 1000 unit daily  -Diphenhydramine 25 mg PRN -Lasix 40mg  daily -Miralax -Haloperidol 5mg  q4hr prn -Hydral 50mg  tid -Levalbuterol -Lorazepam 0.5mg  q4hr prn -Metop tartrte 50mg  bid -Molnupiravir 800 bid started 4/4 end 4/9 -Senna, tums, tylenol No outpatient medications have been marked as taking for the 02/20/23 encounter [facility](Hospital Encounter).   Past Surgical History Past Surgical History:  Procedure Laterality Date   CARDIAC CATHETERIZATION  2009   Social:  Lives[facility]  Support: daughter lives in [place] Level of Function:  PCP: Storm FriskWright, Patrick E, MD Substances: denies tobacco, alcohol or illicit drug use   Family History:  Family History  Problem Relation Age of Onset   CAD Mother    Breast cancer Mother    Allergies: Allergies as of 02/20/2023 - Review Complete 02/20/2023  Allergen Reaction Noted   Other Other (See Comments) 02/20/2022   Latex Other (See Comments) 02/20/2022   Penicillins Nausea Only 07/09/2020   Tape Other (See Comments) 02/20/2022   Aspirin Other (See Comments) 05/27/2015   Atorvastatin Other (See Comments) 05/27/2015   Diltiazem hcl Nausea And Vomiting 05/27/2015    Review of Systems: A complete ROS was negative except as  per HPI.   OBJECTIVE:   Physical Exam: Blood pressure (!) 146/93, pulse (!) 134, temperature 98.8 F (37.1 C), temperature source Oral, resp. rate (!) 28, SpO2 100 % on 2L [place].  Constitutional: awake, laying up in bed comfortably, in no acute distress HENT: normocephalic atraumatic Neck: supple Cardiovascular: irregular rhythm, tachycardic, no JVD, bilateral 1-2+ pitting LE edema Pulmonary/Chest: non-labored breathing on 2L [place], bibasilar course crackles, diffuse rhonchi, no wheezing  Abdominal: soft,  non-tender, non-distended MSK: normal bulk and tone Neurological: awake and alert  Skin: warm and dry Psych: pleasant mood  Labs: CBC    Component Value Date/Time   WBC 5.2 02/20/2023 1213   RBC 3.57 (L) 02/20/2023 1213   HGB 10.9 (L) 02/20/2023 1219   HGB 10.2 (L) 03/08/2022 1414   HCT 32.0 (L) 02/20/2023 1219   HCT 31.7 (L) 03/08/2022 1414   PLT 190 02/20/2023 1213   PLT 225 03/08/2022 1414   MCV 87.1 02/20/2023 1213   MCV 86 03/08/2022 1414   MCH 26.9 02/20/2023 1213   MCHC 30.9 02/20/2023 1213   RDW 17.2 (H) 02/20/2023 1213   RDW 13.0 03/08/2022 1414   LYMPHSABS 1.9 02/20/2023 1213   LYMPHSABS 2.3 03/08/2022 1414   MONOABS 0.5 02/20/2023 1213   EOSABS 0.0 02/20/2023 1213   EOSABS 0.1 03/08/2022 1414   BASOSABS 0.0 02/20/2023 1213   BASOSABS 0.0 03/08/2022 1414     CMP     Component Value Date/Time   NA 139 02/20/2023 1219   NA 145 (H) 03/08/2022 1414   K 3.4 (L) 02/20/2023 1219   CL 101 02/20/2023 1213   CO2 25 02/20/2023 1213   GLUCOSE 108 (H) 02/20/2023 1213   BUN 20 02/20/2023 1213   BUN 39 (H) 03/08/2022 1414   CREATININE 2.41 (H) 02/20/2023 1213   CALCIUM 8.3 (L) 02/20/2023 1213   PROT 6.6 02/20/2023 1213   PROT 7.0 11/18/2021 1000   ALBUMIN 2.6 (L) 02/20/2023 1213   ALBUMIN 3.4 (L) 11/18/2021 1000   AST 25 02/20/2023 1213   ALT 15 02/20/2023 1213   ALKPHOS 145 (H) 02/20/2023 1213   BILITOT 0.9 02/20/2023 1213   BILITOT 0.3 11/18/2021 1000   GFRNONAA 19 (L) 02/20/2023 1213   GFRAA 23 (L) 01/05/2021 1018    Imaging: DG Chest Portable 1 View CLINICAL DATA:  Dyspnea  EXAM: PORTABLE CHEST 1 VIEW  COMPARISON:  Previous studies including the examination of 01/14/2023  FINDINGS: Transverse diameter of heart is increased. Central pulmonary vessels are prominent. There are no signs of alveolar pulmonary edema. No new focal infiltrates are seen. There is no significant pleural effusion or pneumothorax. Left hemidiaphragm is  elevated.  IMPRESSION: Cardiomegaly. There are no signs of alveolar pulmonary edema or new focal pulmonary consolidation.  Electronically Signed   By: Ernie Avena M.D.   On: 02/20/2023 12:44  EKG: personally reviewed my interpretation is atrial fibrillation, rate 119. No significant changes from prior EKG which showed Afib with RVR.   ASSESSMENT & PLAN:   Assessment & Plan by Problem: Principal Problem:   Acute on chronic heart failure with preserved ejection fraction (HFpEF)   Jillian [name] is a 87 y.o. person living with a history of HFpEF (EF 55%), chronic respiratory failure on 2L [place], HTN, Hx of CVA, Afib on Eliquis, CKD IV, and dementia who presented with dyspnea on exertion and admitted for acute on chronic HFpEF on hospital day 0  Acute on chronic HFpEF (EF 55%) Chronic respiratory failure on 2L   Elevated troponin Presented with dyspnea on exertion with associated orthopnea and bilateral LE pitting edema for about 2 weeks. Elevated BNP 2009. Elevated but flat troponin, no current chest pain and no acute ischemic changes on EKG so likely due to demand ischemia. CXR without consolidation or effusion noted. Echo from January showed EF 55% without RWMA. Exam showed bilateral LE edema and course crackles of the lung bases. No JVD. She is satting well on her home 2L [place]. Likely exacerbation in setting of recently diagnosed COVID-19 or concomitant Afib with RVR. Patient was on lasix 40 mg daily at home. Received IV lasix 40 mg in ED. Will assess volume status tomorrow to determine further diuresis. Monitor and replete electrolytes as needed. Have PT and OT assess patient tomorrow.  -assess volume status in AM for further diuresis -trend BMP, replete electrolytes as needed -incentive spirometer  -duoneb PRN -PT and OT eval  -strict I&Os -daily weights -continue home supplemental O2  COVID-19 Recently diagnosed and confirmed in ED. Was on Molnupiravir on 4/4 with end date of  4/9. Her symptoms appear to be ongoing for 2 weeks. Bibasilar crackles and diffuse rhonchi. She is afebrile and normal WBC count. No worsening of symptoms and will provide supportive care. Precautions in place.  Afib with RVR on Eliquis  EKG showed afib with rate 119. HR has been around 110-130. She is on amiodarone 200 mg daily and metoprolol 50 mg BID along with Eliquis 2.5 mg BID (lower dose given age and creatinine). Question whether her Afib with RVR lead to acute CHF or vice versa. Will give IV metoprolol 5 mg one time to control HR. We will restart her home meds to better rate control her.  -continue home amiodarone, metoprolol  -continue home Eliquis  CKD IV Creatinine on arrival 2.41 which is about baseline. 2/28 was 2.44.  -monitor renal function   HTN BP elevated on arrival. She is on amlodipine 5 mg daily along with metoprolol 50 mg BID.  -continue home amlodipine and metoprolol   Normocytic anemia Chronic anemia likely in setting of chronic kidney disease. Hgb stable at baseline 9-10. No signs of active bleeding. -trend CBC  GOC Patient has had recent hospitalizations in January and February. Discharged with hospice care at [facility]. However, she no longer met residential hospice and family unable to care for her at home so she is currently residing at [facility]. Called daughter today, plan is short term rehab at [facility] and plan possibly of returning home or if needed re-initiating hospice care. Patient is DNR. DNR form from [facility] at bedside.   Diet: Normal VTE: DOAC IVF: None,None Code: DNR  Prior to Admission Living Arrangement:  [facility] Anticipated Discharge Location:  [facility]  Barriers to Discharge: diuresis, medical stability  Dispo: Admit patient to Observation with expected length of stay less than 2 midnights.  Signed: Rana Snare, DO Internal Medicine Resident PGY-1 02/20/2023, 6:20 PM   Please contact on call pager, weekdays  after 5pm and weekends after 1pm, at [phone].

## 2023-02-20 NOTE — Hospital Course (Addendum)
Two weeks coughing and shob Coughing up phlegm/mucus Two days ago chills No nausea or vomiting Has noticed swelilng in legs -new ssince been at [facility] Has trouble laying flat - new No chest discomfort/pain. Was having cp last week Two weeks of coughing/congestion Had some palipitatons last week.  No n/v/d She says she wears 2L o2 normally Daughter lives in [place], [place] Has been in [place] after [place]. Was previously living with daughter/grandson  She is still taking blood thinner Amiodarone 200 Amlodiopne 5 Eliquis 2.5 bid Cholecalciferol 1000 unit Diphenhydramine 25 Lasix 40mg  daily Miralax Haloperidol 5mg  q4hr prn Hydral 50mg  tid Levalbuterol Lorazepam 0.5mg  q4hr prn Metop tartrte 50mg  bid Molnupiravir 800 bid startedc 4/4 endc 4/9 Senna, tums, tylenol  No recent surgeries  No drinking or smoking  Daughter has heart problems

## 2023-02-20 NOTE — ED Notes (Signed)
Pt remains a&o to baseline. Pt is currently resting in bed with eyes closed, lights dimmed. Pt attached to monitor/vitals. No distress.

## 2023-02-20 NOTE — ED Provider Notes (Signed)
[facility] 3E HF PCU Provider Note  CSN: [id number] Arrival date & time: 02/20/23 1136  Chief Complaint(s) Shortness of Breath (Diagnosed with Covid 3 days ago, woke up this AM with Shob, weakness, and just not feeling well. )  HPI Jillian [name] is a 87 y.o. female with PMH CVA, CKD 4, HTN, paroxysmal A-fib on Eliquis, recent hospital admission for hypoxic respiratory failure with ultimate transition to hospice care, subsequent transition to rehab facility with continued DNR who presents emergency department for evaluation of shortness of breath and generalized weakness.  Patient tested positive for COVID 3 days ago and has been progressively worsening from a breathing functional status.  I spoke with the patient's daughter on the phone who states that hospital evaluation and workup is still within the patient's medical wishes despite patient's dementia.  Here in the emergency room, patient states she is just not feeling well and having some mild trouble breathing.  Arrives on 2 L nasal cannula which is patient's home use.   Past Medical History Past Medical History:  Diagnosis Date   Acute CVA (cerebrovascular accident) (HCC) 07/12/2020   Hypertension    Renal disorder    CKD Stage IV   Stroke [facility])    Patient Active Problem List   Diagnosis Date Noted   Acute on chronic heart failure with preserved ejection fraction (HFpEF) 02/20/2023   Multifocal pneumonia 12/29/2022   Occult GI bleeding 12/29/2022   Acute encephalopathy 12/29/2022   Liver lesion 12/29/2022   Acute on chronic respiratory failure with hypoxia and hypercapnia 11/28/2022   Vitamin D deficiency 10/14/2022   Paroxysmal atrial fibrillation with RVR 10/12/2022   Elevated brain natriuretic peptide (BNP) level 10/12/2022   Acute bronchitis 02/19/2022   Alopecia 01/05/2021   Pain in both feet 01/05/2021   Anemia due to stage 4 chronic kidney disease 07/24/2020   Chronic diastolic heart failure 07/24/2020    Hypertension with impaired renal function 07/24/2020   Mixed dyslipidemia 07/24/2020   Allergic rhinitis 07/24/2020   History of CVA (cerebrovascular accident) 07/24/2020   Prediabetes 07/24/2020   Acute renal failure superimposed on stage 4 chronic kidney disease 07/09/2020   Class 2 severe obesity due to excess calories with serious comorbidity and body mass index (BMI) of 36.0 to 36.9 in adult 10/28/2016   Allergic conjunctivitis of both eyes 05/27/2015   Benign essential hypertension 11/15/1898   Cerebral artery occlusion 11/15/1898   Home Medication(s) Prior to Admission medications   Medication Sig Start Date End Date Taking? Authorizing Provider  amiodarone (PACERONE) 200 MG tablet Take 1 tablet (200 mg total) by mouth daily. 12/14/22   Dorcas Carrow, MD  amLODipine (NORVASC) 5 MG tablet Take 5 mg by mouth daily.    [provider]  apixaban (ELIQUIS) 2.5 MG TABS tablet Take 1 tablet (2.5 mg total) by mouth 2 (two) times daily. 11/11/22   Anders Simmonds, PA-C  cholecalciferol (VITAMIN D3) 25 MCG (1000 UNIT) tablet Take 1,000 Units by mouth daily. 10/15/22   [provider]  diphenhydrAMINE (BENADRYL) 25 mg capsule Take 25 mg by mouth every 6 (six) hours as needed for itching or allergies. Patient not taking: Reported on 01/12/2023 10/15/22   [provider]  furosemide (LASIX) 40 MG tablet Take 1 tablet (40 mg total) by mouth daily. 12/07/22 01/15/23  Dorcas Carrow, MD  haloperidol (HALDOL) 5 MG tablet Take 5 mg by mouth every 4 (four) hours as needed for agitation. 12/16/22   [provider]  hydrALAZINE (  APRESOLINE) 50 MG tablet Take 50 mg by mouth every 8 (eight) hours.    [provider]  levalbuterol Pauline Aus) 1.25 MG/0.5ML nebulizer solution Take 1.25 mg by nebulization every 6 (six) hours as needed for wheezing or shortness of breath. 12/07/22 01/15/23  Dorcas Carrow, MD  LORazepam (ATIVAN) 0.5 MG tablet Take 0.5 mg by mouth every 4 (four)  hours as needed for anxiety. 12/27/22   [provider]  metoprolol tartrate (LOPRESSOR) 50 MG tablet Take 1 tablet (50 mg total) by mouth 2 (two) times daily. 12/07/22 01/15/23  Dorcas Carrow, MD  polyethylene glycol (MIRALAX / GLYCOLAX) 17 g packet Take 17 g by mouth daily. 12/07/22   Dorcas Carrow, MD  sennosides-docusate sodium (SENOKOT-S) 8.6-50 MG tablet Take 3 tablets by mouth at bedtime. Patient taking differently: Take 3 tablets by mouth at bedtime as needed for constipation. 10/01/21   Storm Frisk, MD                                                                                                                                    Past Surgical History Past Surgical History:  Procedure Laterality Date   CARDIAC CATHETERIZATION  2009   Family History Family History  Problem Relation Age of Onset   CAD Mother    Breast cancer Mother     Social History Social History   Tobacco Use   Smoking status: Never   Smokeless tobacco: Never   Allergies Other, Latex, Penicillins, Tape, Aspirin, Atorvastatin, and Diltiazem hcl  Review of Systems Review of Systems  Constitutional:  Positive for fatigue.  Respiratory:  Positive for cough and shortness of breath.     Physical Exam Vital Signs  I have reviewed the triage vital signs BP (!) 133/102   Pulse (!) 142   Temp 98.8 F (37.1 C) (Oral)   Resp (!) 26   Ht  (1.549 m)   Wt 73.2 kg   SpO2 100%   BMI 30.49 kg/m   Physical Exam Vitals and nursing note reviewed.  Constitutional:      General: She is not in acute distress.    Appearance: She is well-developed. She is ill-appearing.  HENT:     Head: Normocephalic and atraumatic.  Eyes:     Conjunctiva/sclera: Conjunctivae normal.  Cardiovascular:     Rate and Rhythm: Normal rate and regular rhythm.     Heart sounds: No murmur heard. Pulmonary:     Effort: Pulmonary effort is normal. No respiratory distress.     Breath sounds: Wheezing and rales  present.  Abdominal:     Palpations: Abdomen is soft.     Tenderness: There is no abdominal tenderness.  Musculoskeletal:        General: No swelling.     Cervical back: Neck supple.  Skin:    General: Skin is warm and dry.     Capillary Refill: Capillary refill takes less  than 2 seconds.  Neurological:     Mental Status: She is alert.  Psychiatric:        Mood and Affect: Mood normal.     ED Results and Treatments Labs (all labs ordered are listed, but only abnormal results are displayed) Labs Reviewed  SARS CORONAVIRUS 2 BY RT PCR - Abnormal; Notable for the following components:      Result Value   SARS Coronavirus 2 by RT PCR POSITIVE (*)    All other components within normal limits  COMPREHENSIVE METABOLIC PANEL - Abnormal; Notable for the following components:   Potassium 3.4 (*)    Glucose, Bld 108 (*)    Creatinine, Ser 2.41 (*)    Calcium 8.3 (*)    Albumin 2.6 (*)    Alkaline Phosphatase 145 (*)    GFR, Estimated 19 (*)    All other components within normal limits  CBC WITH DIFFERENTIAL/PLATELET - Abnormal; Notable for the following components:   RBC 3.57 (*)    Hemoglobin 9.6 (*)    HCT 31.1 (*)    RDW 17.2 (*)    All other components within normal limits  BRAIN NATRIURETIC PEPTIDE - Abnormal; Notable for the following components:   B Natriuretic Peptide 2,009.3 (*)    All other components within normal limits  URINALYSIS, ROUTINE W REFLEX MICROSCOPIC - Abnormal; Notable for the following components:   Protein, ur >=300 (*)    Bacteria, UA RARE (*)    All other components within normal limits  I-STAT VENOUS BLOOD GAS, ED - Abnormal; Notable for the following components:   pH, Ven 7.512 (*)    pCO2, Ven 31.2 (*)    pO2, Ven 124 (*)    Potassium 3.4 (*)    Calcium, Ion 1.04 (*)    HCT 32.0 (*)    Hemoglobin 10.9 (*)    All other components within normal limits  TROPONIN I (HIGH SENSITIVITY) - Abnormal; Notable for the following components:   Troponin  I (High Sensitivity) 56 (*)    All other components within normal limits  TROPONIN I (HIGH SENSITIVITY) - Abnormal; Notable for the following components:   Troponin I (High Sensitivity) 58 (*)    All other components within normal limits  BLOOD GAS, VENOUS  BASIC METABOLIC PANEL  CBC                                                                                                                          Radiology DG Chest Portable 1 View  Result Date: 02/20/2023 CLINICAL DATA:  Dyspnea EXAM: PORTABLE CHEST 1 VIEW COMPARISON:  Previous studies including the examination of 01/14/2023 FINDINGS: Transverse diameter of heart is increased. Central pulmonary vessels are prominent. There are no signs of alveolar pulmonary edema. No new focal infiltrates are seen. There is no significant pleural effusion or pneumothorax. Left hemidiaphragm is elevated. IMPRESSION: Cardiomegaly. There are no signs of alveolar pulmonary edema or new focal pulmonary consolidation. Electronically Signed  By: Ernie AvenaPalani  Rathinasamy M.D.   On: 02/20/2023 12:44    Pertinent labs & imaging results that were available during my care of the patient were reviewed by me and considered in my medical decision making (see MDM for details).  Medications Ordered in ED Medications  acetaminophen (TYLENOL) tablet 650 mg (has no administration in time range)    Or  acetaminophen (TYLENOL) suppository 650 mg (has no administration in time range)  apixaban (ELIQUIS) tablet 2.5 mg (has no administration in time range)  haloperidol (HALDOL) tablet 5 mg (has no administration in time range)  LORazepam (ATIVAN) tablet 0.5 mg (has no administration in time range)  amiodarone (PACERONE) tablet 200 mg (has no administration in time range)  amLODipine (NORVASC) tablet 5 mg (has no administration in time range)  metoprolol tartrate (LOPRESSOR) tablet 50 mg (has no administration in time range)  metoprolol tartrate (LOPRESSOR) injection 5 mg (has no  administration in time range)  ipratropium-albuterol (DUONEB) 0.5-2.5 (3) MG/3ML nebulizer solution 3 mL (has no administration in time range)  senna-docusate (Senokot-S) tablet 1 tablet (has no administration in time range)  ipratropium-albuterol (DUONEB) 0.5-2.5 (3) MG/3ML nebulizer solution 3 mL (3 mLs Nebulization Given 02/20/23 1226)  metoprolol tartrate (LOPRESSOR) injection 5 mg (5 mg Intravenous Given 02/20/23 1357)  furosemide (LASIX) injection 40 mg (40 mg Intravenous Given 02/20/23 1624)                                                                                                                                     Procedures .Critical Care  Performed by: Glendora ScoreKommor, Washington Whedbee, MD Authorized by: Glendora ScoreKommor, Kalven Ganim, MD   Critical care provider statement:    Critical care time (minutes):  30   Critical care was necessary to treat or prevent imminent or life-threatening deterioration of the following conditions:  Respiratory failure   Critical care was time spent personally by me on the following activities:  Development of treatment plan with patient or surrogate, discussions with consultants, evaluation of patient's response to treatment, examination of patient, ordering and review of laboratory studies, ordering and review of radiographic studies, ordering and performing treatments and interventions, pulse oximetry, re-evaluation of patient's condition and review of old charts   (including critical care time)  Medical Decision Making / ED Course   This patient presents to the ED for concern of shortness of breath, this involves an extensive number of treatment options, and is a complaint that carries with it a high risk of complications and morbidity.  The differential diagnosis includes Pe, PTX, Pulmonary Edema, ARDS, COPD/Asthma, ACS, CHF exacerbation, Arrhythmia, Pericardial Effusion/Tamponade, Anemia, Sepsis, Acidosis/Hypercapnia, Anxiety, Viral URI  MDM: Patient seen in the emergency  department for evaluation of shortness of breath.  Physical exam with wheezing in the upper lobes, Rales in the lower lobes, rapid irregular tachycardia.  pH 7.51 with no significant hypercarbia.  Mild hypokalemia to 3.4, hemoglobin 9.6, creatinine 2.41, BNP  2009 which is a significant lobation for this patient.  Urinalysis without infection.  COVID test is positive.  High-sensitivity troponin elevated to 56 and delta troponin flat at 58.  Likely demand ischemia.  Patient received 1 DuoNeb that led to significant improvement of her wheezing.  Diuresis begun with Lasix and chest x-ray concerning for CHF.  ECG with A-fib with RVR and Lopressor dose given.  Beta-blockade and calcium channel blockade given cautiously in the setting of suspected CHF exacerbation.  Patient then admitted for further workup of her suspected CHF exacerbation and A-fib with RVR.   Additional history obtained: -Additional history obtained from daughter -External records from outside source obtained and reviewed including: Chart review including previous notes, labs, imaging, consultation notes   Lab Tests: -I ordered, reviewed, and interpreted labs.   The pertinent results include:   Labs Reviewed  SARS CORONAVIRUS 2 BY RT PCR - Abnormal; Notable for the following components:      Result Value   SARS Coronavirus 2 by RT PCR POSITIVE (*)    All other components within normal limits  COMPREHENSIVE METABOLIC PANEL - Abnormal; Notable for the following components:   Potassium 3.4 (*)    Glucose, Bld 108 (*)    Creatinine, Ser 2.41 (*)    Calcium 8.3 (*)    Albumin 2.6 (*)    Alkaline Phosphatase 145 (*)    GFR, Estimated 19 (*)    All other components within normal limits  CBC WITH DIFFERENTIAL/PLATELET - Abnormal; Notable for the following components:   RBC 3.57 (*)    Hemoglobin 9.6 (*)    HCT 31.1 (*)    RDW 17.2 (*)    All other components within normal limits  BRAIN NATRIURETIC PEPTIDE - Abnormal; Notable for  the following components:   B Natriuretic Peptide 2,009.3 (*)    All other components within normal limits  URINALYSIS, ROUTINE W REFLEX MICROSCOPIC - Abnormal; Notable for the following components:   Protein, ur >=300 (*)    Bacteria, UA RARE (*)    All other components within normal limits  I-STAT VENOUS BLOOD GAS, ED - Abnormal; Notable for the following components:   pH, Ven 7.512 (*)    pCO2, Ven 31.2 (*)    pO2, Ven 124 (*)    Potassium 3.4 (*)    Calcium, Ion 1.04 (*)    HCT 32.0 (*)    Hemoglobin 10.9 (*)    All other components within normal limits  TROPONIN I (HIGH SENSITIVITY) - Abnormal; Notable for the following components:   Troponin I (High Sensitivity) 56 (*)    All other components within normal limits  TROPONIN I (HIGH SENSITIVITY) - Abnormal; Notable for the following components:   Troponin I (High Sensitivity) 58 (*)    All other components within normal limits  BLOOD GAS, VENOUS  BASIC METABOLIC PANEL  CBC      EKG   EKG Interpretation  Date/Time:  Sunday February 20 2023 11:48:20 EDT Ventricular Rate:  119 PR Interval:    QRS Duration: 104 QT Interval:  343 QTC Calculation: 483 R Axis:   50 Text Interpretation: Atrial fibrillation Ventricular premature complex Probable LVH with secondary repol abnrm Artifact in lead(s) I II III aVR aVL aVF V1 V2 V4 Confirmed by Taelyn Broecker ([id number]) on 02/20/2023 7:31:55 PM         Imaging Studies ordered: I ordered imaging studies including chest x-ray I independently visualized and interpreted imaging. I agree with the radiologist interpretation  Medicines ordered and prescription drug management: Meds ordered this encounter  Medications   ipratropium-albuterol (DUONEB) 0.5-2.5 (3) MG/3ML nebulizer solution 3 mL   metoprolol tartrate (LOPRESSOR) injection 5 mg   furosemide (LASIX) injection 40 mg   OR Linked Order Group    acetaminophen (TYLENOL) tablet 650 mg    acetaminophen (TYLENOL) suppository 650 mg    DISCONTD: amiodarone (PACERONE) tablet 200 mg   DISCONTD: amLODipine (NORVASC) tablet 5 mg   apixaban (ELIQUIS) tablet 2.5 mg   haloperidol (HALDOL) tablet 5 mg   LORazepam (ATIVAN) tablet 0.5 mg   amiodarone (PACERONE) tablet 200 mg   amLODipine (NORVASC) tablet 5 mg   metoprolol tartrate (LOPRESSOR) tablet 50 mg   metoprolol tartrate (LOPRESSOR) injection 5 mg   ipratropium-albuterol (DUONEB) 0.5-2.5 (3) MG/3ML nebulizer solution 3 mL   senna-docusate (Senokot-S) tablet 1 tablet    -I have reviewed the patients home medicines and have made adjustments as needed  Critical interventions DuoNeb, supplemental oxygen, diuresis   Cardiac Monitoring: The patient was maintained on a cardiac monitor.  I personally viewed and interpreted the cardiac monitored which showed an underlying rhythm of: A-fib with RVR  Social Determinants of Health:  Factors impacting patients care include: Currently lives in rehab facility, CODE STATUS remains DNR   Reevaluation: After the interventions noted above, I reevaluated the patient and found that they have :improved  Co morbidities that complicate the patient evaluation  Past Medical History:  Diagnosis Date   Acute CVA (cerebrovascular accident) (HCC) 07/12/2020   Hypertension    Renal disorder    CKD Stage IV   Stroke (HCC)       Dispostion: I considered admission for this patient, and due to CHF exacerbation and A-fib with RVR patient require hospital admission     Final Clinical Impression(s) / ED Diagnoses Final diagnoses:  Acute on chronic congestive heart failure, unspecified heart failure type     @PCDICTATION @    Glendora Score, MD 02/20/23 1933

## 2023-02-20 NOTE — ED Notes (Signed)
Pt placed on the bed pan because she said she needed to have a BM. Pt already has a PW in place. Pt only urinated, she did not have a BM. Pt cleaned with warm, wet wash cloths. Mepilex placed due to wound on buttocks. New brief placed, chucks placed. Pt left with blankets, PW, bedrails up x 2. TV on.

## 2023-02-21 DIAGNOSIS — I252 Old myocardial infarction: Secondary | ICD-10-CM | POA: Diagnosis not present

## 2023-02-21 DIAGNOSIS — U071 COVID-19: Secondary | ICD-10-CM | POA: Diagnosis present

## 2023-02-21 DIAGNOSIS — I480 Paroxysmal atrial fibrillation: Secondary | ICD-10-CM | POA: Diagnosis not present

## 2023-02-21 DIAGNOSIS — K769 Liver disease, unspecified: Secondary | ICD-10-CM | POA: Diagnosis present

## 2023-02-21 DIAGNOSIS — Z91048 Other nonmedicinal substance allergy status: Secondary | ICD-10-CM | POA: Diagnosis not present

## 2023-02-21 DIAGNOSIS — Z803 Family history of malignant neoplasm of breast: Secondary | ICD-10-CM | POA: Diagnosis not present

## 2023-02-21 DIAGNOSIS — Z888 Allergy status to other drugs, medicaments and biological substances status: Secondary | ICD-10-CM | POA: Diagnosis not present

## 2023-02-21 DIAGNOSIS — Z91040 Latex allergy status: Secondary | ICD-10-CM | POA: Diagnosis not present

## 2023-02-21 DIAGNOSIS — J9610 Chronic respiratory failure, unspecified whether with hypoxia or hypercapnia: Secondary | ICD-10-CM

## 2023-02-21 DIAGNOSIS — R1310 Dysphagia, unspecified: Secondary | ICD-10-CM | POA: Diagnosis present

## 2023-02-21 DIAGNOSIS — F0390 Unspecified dementia without behavioral disturbance: Secondary | ICD-10-CM | POA: Diagnosis present

## 2023-02-21 DIAGNOSIS — I130 Hypertensive heart and chronic kidney disease with heart failure and stage 1 through stage 4 chronic kidney disease, or unspecified chronic kidney disease: Secondary | ICD-10-CM | POA: Diagnosis present

## 2023-02-21 DIAGNOSIS — Z880 Allergy status to penicillin: Secondary | ICD-10-CM | POA: Diagnosis not present

## 2023-02-21 DIAGNOSIS — N184 Chronic kidney disease, stage 4 (severe): Secondary | ICD-10-CM | POA: Diagnosis present

## 2023-02-21 DIAGNOSIS — D631 Anemia in chronic kidney disease: Secondary | ICD-10-CM | POA: Diagnosis present

## 2023-02-21 DIAGNOSIS — I5033 Acute on chronic diastolic (congestive) heart failure: Secondary | ICD-10-CM | POA: Diagnosis present

## 2023-02-21 DIAGNOSIS — I4891 Unspecified atrial fibrillation: Secondary | ICD-10-CM | POA: Diagnosis not present

## 2023-02-21 DIAGNOSIS — I2489 Other forms of acute ischemic heart disease: Secondary | ICD-10-CM | POA: Diagnosis present

## 2023-02-21 DIAGNOSIS — Z8249 Family history of ischemic heart disease and other diseases of the circulatory system: Secondary | ICD-10-CM | POA: Diagnosis not present

## 2023-02-21 DIAGNOSIS — Z886 Allergy status to analgesic agent status: Secondary | ICD-10-CM | POA: Diagnosis not present

## 2023-02-21 DIAGNOSIS — Z79899 Other long term (current) drug therapy: Secondary | ICD-10-CM | POA: Diagnosis not present

## 2023-02-21 DIAGNOSIS — I509 Heart failure, unspecified: Secondary | ICD-10-CM | POA: Diagnosis present

## 2023-02-21 DIAGNOSIS — Z66 Do not resuscitate: Secondary | ICD-10-CM | POA: Diagnosis present

## 2023-02-21 DIAGNOSIS — I4819 Other persistent atrial fibrillation: Secondary | ICD-10-CM | POA: Diagnosis present

## 2023-02-21 DIAGNOSIS — J9621 Acute and chronic respiratory failure with hypoxia: Secondary | ICD-10-CM | POA: Diagnosis present

## 2023-02-21 DIAGNOSIS — E869 Volume depletion, unspecified: Secondary | ICD-10-CM | POA: Diagnosis not present

## 2023-02-21 DIAGNOSIS — E876 Hypokalemia: Secondary | ICD-10-CM | POA: Diagnosis present

## 2023-02-21 DIAGNOSIS — Z7901 Long term (current) use of anticoagulants: Secondary | ICD-10-CM | POA: Diagnosis not present

## 2023-02-21 LAB — CBC
HCT: 32.3 % — ABNORMAL LOW (ref 36.0–46.0)
Hemoglobin: 9.8 g/dL — ABNORMAL LOW (ref 12.0–15.0)
MCH: 26.9 pg (ref 26.0–34.0)
MCHC: 30.3 g/dL (ref 30.0–36.0)
MCV: 88.7 fL (ref 80.0–100.0)
Platelets: 173 10*3/uL (ref 150–400)
RBC: 3.64 MIL/uL — ABNORMAL LOW (ref 3.87–5.11)
RDW: 17.1 % — ABNORMAL HIGH (ref 11.5–15.5)
WBC: 4.1 10*3/uL (ref 4.0–10.5)
nRBC: 0 % (ref 0.0–0.2)

## 2023-02-21 LAB — BASIC METABOLIC PANEL
Anion gap: 13 (ref 5–15)
BUN: 19 mg/dL (ref 8–23)
CO2: 24 mmol/L (ref 22–32)
Calcium: 8.1 mg/dL — ABNORMAL LOW (ref 8.9–10.3)
Chloride: 102 mmol/L (ref 98–111)
Creatinine, Ser: 2.24 mg/dL — ABNORMAL HIGH (ref 0.44–1.00)
GFR, Estimated: 21 mL/min — ABNORMAL LOW (ref >60)
Glucose, Bld: 94 mg/dL (ref 70–99)
Potassium: 3.2 mmol/L — ABNORMAL LOW (ref 3.5–5.1)
Sodium: 139 mmol/L (ref 135–145)

## 2023-02-21 MED ORDER — FUROSEMIDE 10 MG/ML IJ SOLN
60.0000 mg | Freq: Once | INTRAMUSCULAR | Status: AC
  Administered 2023-02-21: 60 mg via INTRAVENOUS
  Filled 2023-02-21: qty 6

## 2023-02-21 MED ORDER — POTASSIUM CHLORIDE 20 MEQ PO PACK
40.0000 meq | PACK | Freq: Three times a day (TID) | ORAL | Status: AC
  Administered 2023-02-21 (×3): 40 meq via ORAL
  Filled 2023-02-21 (×3): qty 2

## 2023-02-21 MED ORDER — FUROSEMIDE 10 MG/ML IJ SOLN
80.0000 mg | Freq: Once | INTRAMUSCULAR | Status: AC
  Administered 2023-02-21: 80 mg via INTRAVENOUS
  Filled 2023-02-21: qty 8

## 2023-02-21 NOTE — Progress Notes (Incomplete)
Subjective:  Ms Broomell xxx X was feeling fine this morning. Oriented to person, year, and general hospital setting. Believed that she was staying in [facility]. Discussed plan to continue diuresis at greater than home dose, patient expressed understanding and agreement.    Objective: Vitals over previous 24hr: Vitals:   02/21/23 0011 02/21/23 0400 02/21/23 0437 02/21/23 0755  BP: (!) 152/97 (!) 143/72  (!) 148/91  Pulse: 81   97  Resp: 19 20  15   Temp: 99.5 F (37.5 C) 99.4 F (37.4 C)  98.3 F (36.8 C)  TempSrc: Oral Axillary  Oral  SpO2: 100% 100%  98%  Weight:   73.4 kg   Height:   5\' 1"  (1.549 m)     General:      awake and alert, sitting comfortably in chair, cooperative, not in acute distress Lungs:      normal respiratory effort without supplemental oxygen, breathing unlabored, mild bibasilar crackles, no wheezing Cardiac:      regular rate and rhythm, normal S1 and S2, capillary refill 2-3 seconds, 2+ pitting edema Neurologic:      oriented to person-place-time, moving all extremities, no gross focal deficits Psychiatric:      euthymic mood with congruent affect, oriented to person-year-setting, slow but intelligible speech    Assessment/Plan: Ms Dimoff is an 87 year old female with a past medical history of hypertension, CKD IV, HFpEF, AFib, chronic respiratory failure, and dementia who presented with breath shortness, now admitted for treatment of acute heart failure exacerbation.    ---Acute on chronic heart failure preserved ejection fraction ---Chronic respiratory failure Patient has history of heart failure preserved ejection fraction, echocardiogram performed 11-2022 demonstrated EF 55 without regional wall motion abnormality. She presented with breath shortness, leg swelling, and orthopnea that started about two weeks ago. Upon arrival, laboratory testing revealed BNP 2009 and Trop 46-56-58, ECG negative for ischemic changes. Chest  radiograph without any consolidation or effusion. Exam demonstrated BLE edema and bibasilar crackles. Oxygen requirement remains home nasal cannula at 2L/min. Etiology of heart failure exacerbation most likely recent COVID infection given symptom timeline. Atrial fibrillation possibly contributing as well. Two doses of intravenous furosemide, 40mg  and 60mg , have been administered resulting in 2mL net fluid loss since admission. Plan to increase furosemide later today 4-8 if urine output remains low.  > Furosemide 60mg  x1  > Ipratropium-albuterol 0.5-2.5mg  q4 PRN  > Supplemental oxygen via [place] to maintain SpO2 >92  > Trend BMP q24  > Monitor ins-outs and daily weights  > Incentive spirometry  BLADDER SCAN RESULTS FUROSEMIDE PENDING METOPROLOL DOSE   ---Coronavirus disease Patient reports breath shortness for about two weeks, diagnosed with COVID on 4-4 and was given molnupiravir Chest radiograph negative for signs of pneumonia and WBC count has remained within normal limits since admission. Exam demonstrated bibasilar crackles and patient remains afebrile.   > Trend CBC q24  > Encourage hydration    ---Atrial fibrillation with rapid ventricular response Patient has history of atrial fibrillation managed at home with amiodarone and metoprolol. Upon arrival, ECG demonstrated atrial fibrillation with HR 119. Etiology unclear, possibilities include heart failure exacerbation and COVID. Home medications and diltiazem drip have been started, HR has improved to around 100.  > Diltiazem titration  > Amiodarone 200mg  q24  > Metoprolol 50mg  q12  > Apixaban 2.5mg  q12  > Cardiac telemetry   ---Chronic kidney disease IV Patient has history of chronic kidney disease, baseline creatinine around 2-3 and GFR 10-20. Upon arrival, creatinine  2.41 and GFR 21 both at approximate baseline. Creatinine improved to 2.24 on 4-8 after one dose of intravenous furosemide.  > Trend BMP  q24   ---Hypertension Patient has history of hypertension managed at home with amlodipine and metoprolol. Blood pressure has been stable in the slightly elevated range throughout current hospitalization.  > Amlodipine 5mg  q24  > Metoprolol 50mg  q12   ---Normocytic anemia Patient has chronic anemia, baseline hemoglobin around 9.0-10, likely secondary to CKD IV. Hemoglobin has remained at baseline throughout current hospitalization.  > Trend CBC q24   ---Generalized debility ---Dementia Patient has had several hospitalizations earlier this year, previously discharged with hospice care at [facility]. She improved under hospice care and was subsequently transitioned to short-term rehabilitation at [facility]. Exam demonstrated orientation to person, place, and general setting.  > Discharge to [facility]     Principal Problem:   Acute on chronic heart failure with preserved ejection fraction (HFpEF) Active Problems:   CKD (chronic kidney disease) stage 4, GFR 15-29 ml/min   Paroxysmal atrial fibrillation with RVR    Prior to Admission Living Arrangement: [facility] Anticipated Discharge Location: [facility] Barriers to Discharge: diuresis and diltiazem drip Dispo: Anticipated discharge in approximately 1-2 day(s).    Lajuana Ripple, MD Internal Medicine PGY-1 Pager [phone]  After 5pm on weekdays and 1pm on weekends: On Call pager [phone]

## 2023-02-21 NOTE — Plan of Care (Signed)

## 2023-02-21 NOTE — Progress Notes (Signed)
Heart Failure Navigator Progress Note  Assessed for Heart & Vascular TOC clinic readiness.  Patient does not meet criteria due to history of dementia. .   Navigator available for reassessment of patient.   Rhae Hammock, BSN, Scientist, clinical (histocompatibility and immunogenetics) Only

## 2023-02-21 NOTE — Evaluation (Signed)
Physical Therapy Evaluation Patient Details Name: Jillian [name] MRN: [id number] DOB: 02-16-1936 Today's Date: 02/21/2023  History of Present Illness  87 year old female who was admitted from SNF on 02/20/23 for shortness of breath lasting for 2 weeks. PMH of HFpEF (EF 55%), chronic respiratory failure on 2L [place], HTN, Hx of CVA, Afib on Eliquis, CKD IV, and dementia.  Clinical Impression  Pt presents with admitting diagnosis above. Co treat with OT. Pt able to transfer to chair with +2 Mod A however further gait was deferred due to safety. Pt is very limited by fatigue and requires increased time for following 1 step commands inconsistently. Per OT phone call with pt daughter, pt was previously on hospice at home however she was deemed ineligible during previous admission. Pt daughter states that they would like pt to return to SNF to get stronger so she can return to hospice at home. Due to pt current deficits, recommend that pt return to SNF upon DC. PT will continue to follow.     Recommendations for follow up therapy are one component of a multi-disciplinary discharge planning process, led by the attending physician.  Recommendations may be updated based on patient status, additional functional criteria and insurance authorization.  Follow Up Recommendations Can patient physically be transported by private vehicle: No     Assistance Recommended at Discharge Frequent or constant Supervision/Assistance  Patient can return home with the following  Two people to help with walking and/or transfers;A lot of help with bathing/dressing/bathroom;Assistance with cooking/housework;Assistance with feeding;Direct supervision/assist for medications management;Assist for transportation;Help with stairs or ramp for entrance    Equipment Recommendations None recommended by PT (Per accepting facility)  Recommendations for Other Services       Functional Status Assessment Patient has had a recent decline in  their functional status and demonstrates the ability to make significant improvements in function in a reasonable and predictable amount of time.     Precautions / Restrictions Precautions Precautions: Fall;Other (comment) Precaution Comments: Airborne/Contact Restrictions Weight Bearing Restrictions: No      Mobility  Bed Mobility Overal bed mobility: Needs Assistance Bed Mobility: Supine to Sit     Supine to sit: Max assist          Transfers Overall transfer level: Needs assistance Equipment used: 2 person hand held assist Transfers: Sit to/from Stand, Bed to chair/wheelchair/BSC Sit to Stand: Mod assist, +2 physical assistance   Step pivot transfers: +2 physical assistance, Mod assist       General transfer comment: Cues for safety and foot placement.    Ambulation/Gait               General Gait Details: Further gait deferred for safety.  Stairs            Wheelchair Mobility    Modified Rankin (Stroke Patients Only)       Balance Overall balance assessment: Needs assistance Sitting-balance support: Bilateral upper extremity supported, Feet supported Sitting balance-Leahy Scale: Fair Sitting balance - Comments: Able to sit EOB for ~5 minutes.   Standing balance support: Bilateral upper extremity supported, During functional activity Standing balance-Leahy Scale: Poor Standing balance comment: Pt required 2 HHA Mod A for standing balance.                             Pertinent Vitals/Pain Pain Assessment Pain Assessment: No/denies pain    Home Living Family/patient expects to be discharged to:: Skilled nursing facility  Additional Comments: Daughter states that they would like for patient to be DC back to SNF to get stronger before going home with hospice. Pt admitted from [facility].    Prior Function Prior Level of Function : Needs assist;History of Falls (last six months);Patient poor  historian/Family not available             Mobility Comments: Pt states that she was mostly in a WC recently. ADLs Comments: Needed assistance with bathing and dressing from family     Hand Dominance   Dominant Hand: Right    Extremity/Trunk Assessment   Upper Extremity Assessment Upper Extremity Assessment: Defer to OT evaluation    Lower Extremity Assessment Lower Extremity Assessment: Generalized weakness    Cervical / Trunk Assessment Cervical / Trunk Assessment: Kyphotic  Communication   Communication: No difficulties  Cognition Arousal/Alertness: Lethargic Behavior During Therapy: Flat affect Overall Cognitive Status: No family/caregiver present to determine baseline cognitive functioning                                 General Comments: Pt required increased time following 1 step commands inconsistently.        General Comments General comments (skin integrity, edema, etc.): VSS on 3L.    Exercises     Assessment/Plan    PT Assessment Patient needs continued PT services  PT Problem List Decreased strength;Decreased activity tolerance;Decreased range of motion;Decreased balance;Decreased mobility;Decreased cognition;Decreased knowledge of use of DME;Decreased safety awareness;Cardiopulmonary status limiting activity       PT Treatment Interventions DME instruction;Gait training;Stair training;Functional mobility training;Therapeutic activities;Therapeutic exercise;Balance training;Neuromuscular re-education;Patient/family education    PT Goals (Current goals can be found in the Care Plan section)  Acute Rehab PT Goals PT Goal Formulation: With patient Time For Goal Achievement: 03/07/23 Potential to Achieve Goals: Poor    Frequency Min 1X/week     Co-evaluation PT/OT/SLP Co-Evaluation/Treatment: Yes Reason for Co-Treatment: Complexity of the patient's impairments (multi-system involvement);To address functional/ADL transfers PT  goals addressed during session: Mobility/safety with mobility OT goals addressed during session: ADL's and self-care       AM-PAC PT "6 Clicks" Mobility  Outcome Measure Help needed turning from your back to your side while in a flat bed without using bedrails?: A Lot Help needed moving from lying on your back to sitting on the side of a flat bed without using bedrails?: A Lot Help needed moving to and from a bed to a chair (including a wheelchair)?: A Lot Help needed standing up from a chair using your arms (e.g., wheelchair or bedside chair)?: A Lot Help needed to walk in hospital room?: A Lot Help needed climbing 3-5 steps with a railing? : Total 6 Click Score: 11    End of Session Equipment Utilized During Treatment: Oxygen Activity Tolerance: Patient limited by fatigue Patient left: in chair;with call bell/phone within reach;with chair alarm set Nurse Communication: Mobility status PT Visit Diagnosis: Other abnormalities of gait and mobility (R26.89);Muscle weakness (generalized) (M62.81)    Time: 5784-6962 PT Time Calculation (min) (ACUTE ONLY): 36 min   Charges:   PT Evaluation $PT Eval Moderate Complexity: 1 Mod PT Treatments $Therapeutic Activity: 8-22 mins        Shela Nevin, PT, DPT Acute Rehab Services [phone]   Gladys Damme 02/21/2023, 10:10 AM

## 2023-02-21 NOTE — Evaluation (Signed)
Occupational Therapy Evaluation Patient Details Name: Jillian [name] MRN: [id number] DOB: [birth year] Today's Date: 02/21/2023   History of Present Illness 87 year old female who was admitted from SNF on 02/20/23 for shortness of breath lasting for 2 weeks. Pt found to be +COVID and with acute on chronic CHF. PMH:  CRF, HTN, afib, 2L O2 home, dementia, CVA   Clinical Impression   Pt admitted with the above diagnosis and has the deficits listed below. Pt would benefit from cont OT to increase independence with basic adls like feeding and grooming back to baseline level of functioning. Spoke with daughter who stated pt lived with her up until this most recent rehab admit. Daughter would like pt to go back to SNF to get some strength back and then home with some assist from hospice.  Do not feel pt is safe to go home at current level as she is requiring +2 assist for all mobility and adls. Feel pt would benefit from further therapy before returning home but do not feel pt can tolerate more than 2 hours a day.  Will continue to see with focus on feeding and transfers to toilet.       Recommendations for follow up therapy are one component of a multi-disciplinary discharge planning process, led by the attending physician.  Recommendations may be updated based on patient status, additional functional criteria and insurance authorization.   Assistance Recommended at Discharge Frequent or constant Supervision/Assistance  Patient can return home with the following Two people to help with walking and/or transfers;Two people to help with bathing/dressing/bathroom;Assistance with cooking/housework;Assistance with feeding;Direct supervision/assist for medications management;Direct supervision/assist for financial management;Assist for transportation;Help with stairs or ramp for entrance    Functional Status Assessment  Patient has had a recent decline in their functional status and demonstrates the ability to make  significant improvements in function in a reasonable and predictable amount of time.  Equipment Recommendations  None recommended by OT    Recommendations for Other Services       Precautions / Restrictions Precautions Precautions: Fall;Other (comment) (daughter reports falls at home before pt went to rehab) Precaution Comments: Airborne/Contact Restrictions Weight Bearing Restrictions: No      Mobility Bed Mobility Overal bed mobility: Needs Assistance Bed Mobility: Supine to Sit     Supine to sit: Max assist     General bed mobility comments: assist to get to full sit and to scoot hips to EOB    Transfers Overall transfer level: Needs assistance Equipment used: 2 person hand held assist Transfers: Sit to/from Stand, Bed to chair/wheelchair/BSC Sit to Stand: Mod assist, +2 physical assistance     Step pivot transfers: +2 physical assistance, Mod assist     General transfer comment: Step by step cues to make 1/4 turn to chair.      Balance Overall balance assessment: Needs assistance Sitting-balance support: Bilateral upper extremity supported, Feet supported Sitting balance-Leahy Scale: Fair Sitting balance - Comments: Able to sit EOB for ~5 minutes.   Standing balance support: Bilateral upper extremity supported, During functional activity Standing balance-Leahy Scale: Poor Standing balance comment: Pt required 2 HHA Mod A for standing balance.                           ADL either performed or assessed with clinical judgement   ADL Overall ADL's : Needs assistance/impaired Eating/Feeding: Minimal assistance;Sitting Eating/Feeding Details (indicate cue type and reason): assist with set up. Pt can feed  self. Takes a great amount of time and cues to keep going Grooming: Wash/dry hands;Wash/dry face;Oral care;Moderate assistance;Sitting Grooming Details (indicate cue type and reason): pt required hand over hand assist to get started and do a complete  job. Pt required assist to put cap back on toothbrush and assist to reach forehead shen washing face. Upper Body Bathing: Total assistance;Sitting   Lower Body Bathing: Total assistance;Sit to/from stand;+2 for physical assistance   Upper Body Dressing : Total assistance;Sitting   Lower Body Dressing: Total assistance;+2 for physical assistance;Sit to/from stand   Toilet Transfer: Moderate assistance;+2 for physical assistance;BSC/3in1   Toileting- Clothing Manipulation and Hygiene: Maximal assistance;+2 for physical assistance Toileting - Clothing Manipulation Details (indicate cue type and reason): one person standing with pt and one person cleaning pt     Functional mobility during ADLs: Maximal assistance;+2 for physical assistance General ADL Comments: Pt has been very limited with adls since January. Pt unable to do much at home and was falling 2 weeks ago therefore pt went to rehab facility.     Vision Baseline Vision/History: 1 Wears glasses;4 Cataracts Ability to See in Adequate Light: 0 Adequate Patient Visual Report: No change from baseline Vision Assessment?: Yes Eye Alignment: Within Functional Limits Ocular Range of Motion: Within Functional Limits;Other (comment) (very slow to track but able) Alignment/Gaze Preference: Within Defined Limits Tracking/Visual Pursuits: Able to track stimulus in all quads without difficulty;Other (comment) (slow tracking) Convergence: Within functional limits Visual Fields: No apparent deficits     Development worker, international aid Tested?: No   Praxis Praxis Praxis tested?: Not tested    Pertinent Vitals/Pain Pain Assessment Pain Assessment: No/denies pain     Hand Dominance Right   Extremity/Trunk Assessment Upper Extremity Assessment Upper Extremity Assessment: Generalized weakness   Lower Extremity Assessment Lower Extremity Assessment: Defer to PT evaluation   Cervical / Trunk Assessment Cervical / Trunk Assessment:  Kyphotic   Communication Communication Communication: No difficulties   Cognition Arousal/Alertness: Lethargic Behavior During Therapy: Flat affect Overall Cognitive Status: No family/caregiver present to determine baseline cognitive functioning                                 General Comments: Pt oriented to month and year and hospital (not name of hospital.) Pt with very slow processing and motor planning. Poor STM.     General Comments  Pt very limited with cognition and general weakness during all adls.    Exercises     Shoulder Instructions      Home Living Family/patient expects to be discharged to:: Skilled nursing facility                                 Additional Comments: Daughter states that they would like for patient to be DC back to SNF to get stronger before going home with hospice. Pt admitted from [facility].      Prior Functioning/Environment Prior Level of Function : Needs assist;History of Falls (last six months);Patient poor historian/Family not available;Other (comment) (called daughter for details)  Cognitive Assist : Mobility (cognitive);ADLs (cognitive) Mobility (Cognitive): Set up cues ADLs (Cognitive): Step by step cues Physical Assist : Mobility (physical);ADLs (physical) Mobility (physical): Bed mobility;Transfers;Gait;Stairs ADLs (physical): Feeding;Grooming;Bathing;Dressing;Toileting;IADLs Mobility Comments: Pt in w/c most of time.  Last time she walked more than 20 feet was over a month ago per daughter. ADLs  Comments: Family and facility assits with bathing, dressing, grooming, toileting. Pt could feed self.        OT Problem List: Decreased strength;Impaired balance (sitting and/or standing);Decreased activity tolerance;Decreased cognition;Decreased safety awareness;Decreased knowledge of use of DME or AE;Decreased knowledge of precautions      OT Treatment/Interventions: Self-care/ADL  training;Therapeutic activities    OT Goals(Current goals can be found in the care plan section) Acute Rehab OT Goals Patient Stated Goal: none stated OT Goal Formulation: With family Time For Goal Achievement: 03/07/23 Potential to Achieve Goals: Fair ADL Goals Pt Will Perform Eating: with set-up;sitting Pt Will Perform Grooming: with set-up;sitting Pt Will Transfer to Toilet: with min assist;bedside commode Pt Will Perform Toileting - Clothing Manipulation and hygiene: with mod assist;sit to/from stand  OT Frequency: Min 2X/week    Co-evaluation PT/OT/SLP Co-Evaluation/Treatment: Yes Reason for Co-Treatment: Complexity of the patient's impairments (multi-system involvement);To address functional/ADL transfers PT goals addressed during session: Mobility/safety with mobility OT goals addressed during session: ADL's and self-care      AM-PAC OT "6 Clicks" Daily Activity     Outcome Measure Help from another person eating meals?: A Lot Help from another person taking care of personal grooming?: A Lot Help from another person toileting, which includes using toliet, bedpan, or urinal?: Total Help from another person bathing (including washing, rinsing, drying)?: Total Help from another person to put on and taking off regular upper body clothing?: A Lot Help from another person to put on and taking off regular lower body clothing?: Total 6 Click Score: 9   End of Session Nurse Communication: Mobility status  Activity Tolerance: Patient limited by fatigue Patient left: in chair;with call bell/phone within reach;with chair alarm set  OT Visit Diagnosis: Unsteadiness on feet (R26.81)                Time: [id number] OT Time Calculation (min): 37 min Charges:  OT General Charges $OT Visit: 1 Visit OT Evaluation $OT Eval Moderate Complexity: 1 Mod  Hope BuddsJones, Treacy Holcomb Anne 02/21/2023, 10:24 AM

## 2023-02-21 NOTE — Progress Notes (Signed)
Subjective:  Jillian [name] was feeling fine this morning. Oriented to person, year, and general hospital setting. Believed that she was staying in [facility]. Discussed plan to continue diuresis at greater than home dose, patient expressed understanding and agreement.    Objective: Vitals over previous 24hr: Vitals:   02/20/23 2245 02/21/23 0011 02/21/23 0400 02/21/23 0437  BP: (!) 164/107 (!) 152/97 (!) 143/72   Pulse: (!) 104 81    Resp:  19 20   Temp:  99.5 F (37.5 C) 99.4 F (37.4 C)   TempSrc:  Oral Axillary   SpO2:  100% 100%   Weight:    73.4 kg  Height:    5\' 1"  (1.549 m)    General:      awake and alert, sitting comfortably in chair, cooperative, not in acute distress Lungs:      normal respiratory effort without supplemental oxygen, breathing unlabored, mild bibasilar crackles, no wheezing Cardiac:      regular rate and rhythm, normal S1 and S2, capillary refill 2-3 seconds, 2+ pitting edema Neurologic:      oriented to person-place-time, moving all extremities, no gross focal deficits Psychiatric:      euthymic mood with congruent affect, oriented to person-year-setting, slow but intelligible speech    Assessment/Plan: Jillian [name] is an 87 year old female with a past medical history of hypertension, CKD IV, HFpEF, AFib, chronic respiratory failure, and dementia who presented with breath shortness, now admitted for treatment of acute heart failure exacerbation.    ---Acute on chronic heart failure preserved ejection fraction ---Chronic respiratory failure Patient has history of heart failure preserved ejection fraction, echocardiogram performed 11-2022 demonstrated EF 55 without regional wall motion abnormality. She presented with breath shortness, leg swelling, and orthopnea that started about two weeks ago. Upon arrival, laboratory testing revealed BNP 2009 and Trop 46-56-58, ECG negative for ischemic changes. Chest radiograph without any  consolidation or effusion. Exam demonstrated BLE edema and bibasilar crackles. Oxygen requirement remains home nasal cannula at 2L/min. Etiology of heart failure exacerbation most likely recent COVID infection given symptom timeline. Atrial fibrillation possibly contributing as well. Two doses of intravenous furosemide, 40mg  and 60mg , have been administered resulting in 68mL net fluid loss since admission. Plan to increase furosemide later today 4-8 if urine output remains low.  > Furosemide 60mg  x1  > Ipratropium-albuterol 0.5-2.5mg  q4 PRN  > Supplemental oxygen via [place] to maintain SpO2 >92  > Trend BMP q24  > Monitor ins-outs and daily weights  > Incentive spirometry   ---Coronavirus disease Patient reports breath shortness for about two weeks, diagnosed with COVID on 4-4 and was given molnupiravir Chest radiograph negative for signs of pneumonia and WBC count has remained within normal limits since admission. Exam demonstrated bibasilar crackles and patient remains afebrile.   > Trend CBC q24  > Encourage hydration    ---Atrial fibrillation with rapid ventricular response Patient has history of atrial fibrillation managed at home with amiodarone and metoprolol. Upon arrival, ECG demonstrated atrial fibrillation with HR 119. Etiology unclear, possibilities include heart failure exacerbation and COVID. Home medications and diltiazem drip have been started, HR has improved to around 100.  > Diltiazem titration  > Amiodarone 200mg  q24  > Metoprolol 50mg  q12  > Apixaban 2.5mg  q12  > Cardiac telemetry   ---Chronic kidney disease IV Patient has history of chronic kidney disease, baseline creatinine around 2-3 and GFR 10-20. Upon arrival, creatinine 2.41 and GFR 21 both at approximate baseline. Creatinine improved to 2.24  on 4-8 after one dose of intravenous furosemide.  > Trend BMP q24   ---Hypertension Patient has history of hypertension managed at home with amlodipine and metoprolol. Blood  pressure has been stable in the slightly elevated range throughout current hospitalization.  > Amlodipine 5mg  q24  > Metoprolol 50mg  q12   ---Normocytic anemia Patient has chronic anemia, baseline hemoglobin around 9.0-10, likely secondary to CKD IV. Hemoglobin has remained at baseline throughout current hospitalization.  > Trend CBC q24   ---Generalized debility ---Dementia Patient has had several hospitalizations earlier this year, previously discharged with hospice care at [facility]. She improved under hospice care and was subsequently transitioned to short-term rehabilitation at [facility]. Exam demonstrated orientation to person, place, and general setting.  > Discharge to [facility]     Principal Problem:   Acute on chronic heart failure with preserved ejection fraction (HFpEF)    Prior to Admission Living Arrangement: [facility] Anticipated Discharge Location: [facility] Barriers to Discharge: diuresis and diltiazem drip Dispo: Anticipated discharge in approximately 1-2 day(s).    Lajuana Ripple, MD Internal Medicine PGY-1 Pager [phone]  After 5pm on weekdays and 1pm on weekends: On Call pager [phone]

## 2023-02-21 NOTE — TOC Progression Note (Signed)
Transition of Care [facility]) - Progression Note    Patient Details  Name: Jillian [name] MRN: [id number] Date of Birth: Apr 10, 1936  Transition of [facility]) CM/SW Contact  Leander Rams, LCSW Phone Number: 02/21/2023, 2:56 PM  Clinical Narrative:    CSW spoke with Wynona Canes from [facility] and was informed pt was STR. Pt can return to SNF when medically stable and a new insurance [employer] is approved.   TOC will continue to follow this admission.         Expected Discharge Plan and Services                                               Social Determinants of Health (SDOH) Interventions SDOH Screenings   Depression (PHQ2-9): Low Risk  (11/11/2022)  Tobacco Use: Low Risk  (01/14/2023)    Readmission Risk Interventions    12/08/2022   11:52 AM  Readmission Risk Prevention Plan  Transportation Screening Complete  PCP or Specialist Appt within 3-5 Days Not Complete  Not Complete comments home w/ hospice  HRI or Home Care Consult Complete  Social Work Consult for Recovery Care Planning/Counseling Complete  Palliative Care Screening Complete  Medication Review Oceanographer) Complete   Oletta Lamas, MSW, LCSWA, LCASA Transitions of Care  Clinical Social Worker I

## 2023-02-22 DIAGNOSIS — I4891 Unspecified atrial fibrillation: Secondary | ICD-10-CM

## 2023-02-22 DIAGNOSIS — J9610 Chronic respiratory failure, unspecified whether with hypoxia or hypercapnia: Secondary | ICD-10-CM | POA: Diagnosis not present

## 2023-02-22 DIAGNOSIS — I5033 Acute on chronic diastolic (congestive) heart failure: Secondary | ICD-10-CM | POA: Diagnosis not present

## 2023-02-22 LAB — BASIC METABOLIC PANEL
Anion gap: 10 (ref 5–15)
BUN: 20 mg/dL (ref 8–23)
CO2: 27 mmol/L (ref 22–32)
Calcium: 8.4 mg/dL — ABNORMAL LOW (ref 8.9–10.3)
Chloride: 102 mmol/L (ref 98–111)
Creatinine, Ser: 2.43 mg/dL — ABNORMAL HIGH (ref 0.44–1.00)
GFR, Estimated: 19 mL/min — ABNORMAL LOW (ref >60)
Glucose, Bld: 89 mg/dL (ref 70–99)
Potassium: 3.7 mmol/L (ref 3.5–5.1)
Sodium: 139 mmol/L (ref 135–145)

## 2023-02-22 LAB — CBC
HCT: 31.5 % — ABNORMAL LOW (ref 36.0–46.0)
Hemoglobin: 9.5 g/dL — ABNORMAL LOW (ref 12.0–15.0)
MCH: 26.9 pg (ref 26.0–34.0)
MCHC: 30.2 g/dL (ref 30.0–36.0)
MCV: 89.2 fL (ref 80.0–100.0)
Platelets: 191 10*3/uL (ref 150–400)
RBC: 3.53 MIL/uL — ABNORMAL LOW (ref 3.87–5.11)
RDW: 17.2 % — ABNORMAL HIGH (ref 11.5–15.5)
WBC: 5 10*3/uL (ref 4.0–10.5)
nRBC: 0 % (ref 0.0–0.2)

## 2023-02-22 LAB — MAGNESIUM: Magnesium: 1.9 mg/dL (ref 1.7–2.4)

## 2023-02-22 MED ORDER — DILTIAZEM HCL-DEXTROSE 125-5 MG/125ML-% IV SOLN (PREMIX)
5.0000 mg/h | INTRAVENOUS | Status: DC
  Administered 2023-02-22: 5 mg/h via INTRAVENOUS
  Filled 2023-02-22: qty 125

## 2023-02-22 MED ORDER — POTASSIUM CHLORIDE 20 MEQ PO PACK
40.0000 meq | PACK | Freq: Once | ORAL | Status: AC
  Administered 2023-02-22: 40 meq via ORAL
  Filled 2023-02-22: qty 2

## 2023-02-22 MED ORDER — FUROSEMIDE 40 MG PO TABS
40.0000 mg | ORAL_TABLET | Freq: Every day | ORAL | Status: DC
  Administered 2023-02-22 – 2023-02-24 (×3): 40 mg via ORAL
  Filled 2023-02-22 (×3): qty 1

## 2023-02-22 MED ORDER — METOPROLOL TARTRATE 100 MG PO TABS
100.0000 mg | ORAL_TABLET | Freq: Two times a day (BID) | ORAL | Status: DC
  Administered 2023-02-22 – 2023-02-23 (×3): 100 mg via ORAL
  Filled 2023-02-22 (×3): qty 1

## 2023-02-22 MED ORDER — METOPROLOL TARTRATE 50 MG PO TABS
50.0000 mg | ORAL_TABLET | Freq: Once | ORAL | Status: AC
  Administered 2023-02-22: 50 mg via ORAL
  Filled 2023-02-22: qty 1

## 2023-02-22 NOTE — Consult Note (Signed)
   [facility] CM Inpatient Consult   02/22/2023  Jaise Buchan 1936-08-17 [id number]  [facility] [[facility]]  Accountable Care Organization [ACO] Patient: [employer] Medicare  Primary Care Provider:  Storm Frisk, MD, [facility] and Wellness is listed  Patient was reviewed for 3 admissions and 3 ED visits in the past 6 months with extreme high risk score and barriers to care and patient is currently for returning to a skilled nursing level of care.    If the patient goes to a [facility] affiliated facility then, patient can be followed by [facility] [employer] [[facility]] Care Management PAC RN with traditional Medicare and approved Medicare Advantage plans.    Plan:  If patient transitions to a facility, can alert [facility] PAC RN, if at a [facility] affiliated facility, who can follow for any known or needs for transitional care needs for returning to post facility care coordination needs to return to community.  For questions or referrals, please contact:   Charlesetta Shanks, RN BSN CCM [facility]  [phone] business mobile phone Toll free office [phone]  *Concierge Line  [phone] Fax number: [phone] [email or web address]

## 2023-02-22 NOTE — Plan of Care (Signed)

## 2023-02-22 NOTE — Consult Note (Addendum)
Cardiology Consultation   Patient ID: Jillian [name] MRN: [id number]; DOB: Sep 07, 1936  Admit date: 02/20/2023 Date of Consult: 02/22/2023  PCP:  Storm FriskWright, Patrick E, MD   [facility] Providers Cardiologist:  Dietrich PatesPaula Ross, MD   {     Patient Profile:   Jillian GreenlandMary [name] is a 87 y.o. female with a hx of CKD IV, chronic diastolic heart failure, CVA, HTN, persistent A fib, chronic hypoxic respiratory failure on 2 L nasal cannula, dementia, liver lesions,  who is being seen 02/22/2023 for the evaluation of A fib RVR at the request of Dr Antony ContrasGuilloud.  History of Present Illness:   Per chart review, Ms. Jillian MariscalMcKinnon with above PMH who presented to ER c/o shortness of breath on exertion over the past 2 weeks. [facility].  She endorses productive cough with phlegm, nasal congestion, new onset of bilateral lower extremity.  She also reports associated orthopnea.  She was diagnosed with COVID-19 3 days ago and was given molnupiravir 02/17/23. She was enrolled in hospice care at one point after hospitalization in Jan/Feb 2024, but later told she did not meet hospice care criteria.   Upon encounter, patient is sleeping, appears very weak with intermittent cough. She is able to answer yes and no questions, not good historian, unable to elaborate. She states she is SOB for sometime, has been coughing, no chest pain, no heart palpitation or dizziness. She states she is tired. She falls asleep frequently during encounter.   Initial admission diagnostic workup on 02/20/2023 notable for hypokalemia 3.4, elevated creatinine 2.41, EGFR 19, elevated alk phos 145, albumin 2.6.  BNP 2009.  High sensitive troponin 56>58.  CBC with hemoglobin 9.6.  COVID-19 positive.  Urinalysis positive for protein.  Chest x-ray revealed cardiomegaly, no signs of a overload pulmonary edema or consolidation.  She was concern for acute on chronic diastolic heart failure with concurrent COVID-19  infection.  She was admitted to teaching medicine service, given IV Lasix, home dose Lasix has been resumed on 02/22/2023 upon improvement of heart failure symptoms.  In addition, she was noted with A-fib RVR on EKG and telemetry, was given diltiazem drip, metoprolol increased from 50 to 100 mg twice daily while at home amiodarone 200 mg had been continued.  Cardiology is consulted today regarding A-fib RVR.  Per chart review, patient follows Dr. Tenny Crawoss historically since 07/10/2020 during hospitalization for non-STEMI that was felt due to demand ischemia as well as acute on chronic diastolic heart failure requiring IV diuresis.  She was referred to EP Dr. Ladona Ridgelaylor due to recurrent cryptogenic stroke, outpatient event monitor versus implanted loop recorder was offered and declined by the patient.  She was discovered with new onset of atrial fibrillation 08/04/2020 in the office on EKG, was started on carvedilol and Eliquis for management and aspirin plus Plavix (for CVA) were stopped.  On follow up visits in 2021, she was noted back in sinus rhythm spontaneously.  January 2024 she was hospitalized for acute hypoxic respiratory failure in the setting of flu requiring BiPAP support, course was complicated by decompensated diastolic heart failure, A-fib RVR requiring cardioversion at ED complicated by bradycardia and hypotension requiring vasopressor support.  Cardiology was consulted, patient was reloaded with IV amiodarone and discharged on p.o. amiodarone and metoprolol.  Patient was ultimately discharged to home with home hospice.  12/2022 was admitted for acute on chronic hypoxic respiratory failure due to multifocal pneumonia and volume overload, also found to have multiple  liver lesions on ultrasound concerning for metastatic lesion and felt not a candidate for liver biopsy, A-fib was difficult to control with ventricular rate as well.  She was ultimately discharged to hospice.   Past Medical History:   Diagnosis Date   Acute CVA (cerebrovascular accident) (HCC) 07/12/2020   Hypertension    Renal disorder    CKD Stage IV   Stroke [facility])     Past Surgical History:  Procedure Laterality Date   CARDIAC CATHETERIZATION  2009     Home Medications:  Prior to Admission medications   Medication Sig Start Date End Date Taking? Authorizing Provider  acetaminophen (TYLENOL) 325 MG tablet Take 650 mg by mouth every 6 (six) hours as needed for moderate pain.   Yes [provider]  amiodarone (PACERONE) 200 MG tablet Take 1 tablet (200 mg total) by mouth daily. 12/14/22  Yes Dorcas Carrow, MD  amLODipine (NORVASC) 5 MG tablet Take 5 mg by mouth daily.   Yes [provider]  apixaban (ELIQUIS) 2.5 MG TABS tablet Take 1 tablet (2.5 mg total) by mouth 2 (two) times daily. 11/11/22  Yes Anders Simmonds, PA-C  cholecalciferol (VITAMIN D3) 25 MCG (1000 UNIT) tablet Take 1,000 Units by mouth daily. 10/15/22  Yes [provider]  DAKINS EX Apply 1 Application topically daily. To coccyx. Apply medihoney paste after cleaning with dakins.   Yes [provider]  diphenhydrAMINE (BENADRYL) 25 mg capsule Take 25 mg by mouth every 6 (six) hours as needed for itching or allergies. 10/15/22  Yes [provider]  furosemide (LASIX) 40 MG tablet Take 1 tablet (40 mg total) by mouth daily. 12/07/22 02/21/23 Yes Dorcas Carrow, MD  haloperidol (HALDOL) 5 MG tablet Take 5 mg by mouth every 4 (four) hours as needed for agitation. 12/16/22  Yes [provider]  hydrALAZINE (APRESOLINE) 50 MG tablet Take 50 mg by mouth 3 (three) times daily.   Yes [provider]  levalbuterol (XOPENEX) 1.25 MG/0.5ML nebulizer solution Take 1.25 mg by nebulization every 6 (six) hours as needed for wheezing or shortness of breath. 12/07/22 02/21/23 Yes Ghimire, Lyndel Safe, MD  LORazepam (ATIVAN) 0.5 MG tablet Take 0.5 mg by mouth every 4 (four) hours as needed for anxiety. 12/27/22  Yes [provider]  metoprolol tartrate (LOPRESSOR) 50 MG tablet Take 1 tablet (50 mg total) by mouth 2 (two) times daily. 12/07/22 02/21/23 Yes Ghimire, Lyndel Safe, MD  molnupiravir EUA (LAGEVRIO) 200 MG CAPS capsule Take 4 capsules by mouth 2 (two) times daily.   Yes [provider]  Nutritional Supplements (ADULT NUTRITIONAL SUPPLEMENT PO) Take 30 mLs by mouth 2 (two) times daily. Liquid Protein   Yes [provider]  polyethylene glycol (MIRALAX / GLYCOLAX) 17 g packet Take 17 g by mouth daily. 12/07/22  Yes Dorcas Carrow, MD  sennosides-docusate sodium (SENOKOT-S) 8.6-50 MG tablet Take 3 tablets by mouth at bedtime. Patient taking differently: Take 3 tablets by mouth at bedtime. 10/01/21  Yes Storm Frisk, MD    Inpatient Medications: Scheduled Meds:  amiodarone  200 mg Oral Daily   amLODipine  5 mg Oral Daily   apixaban  2.5 mg Oral BID   furosemide  40 mg Oral Daily   metoprolol tartrate  100 mg Oral BID   Continuous Infusions:  diltiazem (CARDIZEM) infusion 5 mg/hr (02/22/23 1022)   PRN Meds: acetaminophen **OR** acetaminophen, haloperidol, ipratropium-albuterol, LORazepam, senna-docusate  Allergies:    Allergies  Allergen Reactions   Other Other (See  Comments)    "Tapes tears off my skin"   Latex Other (See Comments)    "Blisters my skin and tears it off"- skin burns, too   Penicillins Nausea Only   Tape Other (See Comments)    "Tapes tears off my skin"   Aspirin Other (See Comments)    Irritates the stomach   Atorvastatin Other (See Comments)    Caused fatigue   Diltiazem Hcl Nausea And Vomiting    Social History:   Social History   Socioeconomic History   Marital status: Single    Spouse name: Not on file   Number of children: Not on file   Years of education: Not on file   Highest education level: Not on file  Occupational History   Not on file  Tobacco Use   Smoking status: Never   Smokeless tobacco: Never  Substance and Sexual Activity    Alcohol use: Not on file   Drug use: Not on file   Sexual activity: Not on file  Other Topics Concern   Not on file  Social History Narrative   Not on file   Social Determinants of Health   Financial Resource Strain: Not on file  Food Insecurity: Not on file  Transportation Needs: Not on file  Physical Activity: Not on file  Stress: Not on file  Social Connections: Not on file  Intimate Partner Violence: Not on file    Family History:    Family History  Problem Relation Age of Onset   CAD Mother    Breast cancer Mother      ROS:  Unable to obtain patient is very sleepy and unable to elaborate history      Physical Exam/Data:   Vitals:   02/22/23 0658 02/22/23 1014 02/22/23 1212 02/22/23 1530  BP:  119/86 (!) 137/95 (!) 141/89  Pulse: (!) 122 (!) 144 90 76  Resp:  20 16 16   Temp:   97.8 F (36.6 C) 98.3 F (36.8 C)  TempSrc:   Oral Oral  SpO2:  100% 100% 99%  Weight:      Height:        Intake/Output Summary (Last 24 hours) at 02/22/2023 1640 Last data filed at 02/22/2023 1012 Gross per 24 hour  Intake --  Output 2700 ml  Net -2700 ml      02/21/2023   11:40 PM 02/21/2023    4:37 AM 02/20/2023    7:00 PM  Last 3 Weights  Weight (lbs) 159 lb 6.3 oz 161 lb 13.1 oz 161 lb 6 oz  Weight (kg) 72.3 kg 73.4 kg 73.2 kg     Body mass index is 30.12 kg/m.   Vitals:  Vitals:   02/22/23 1212 02/22/23 1530  BP: (!) 137/95 (!) 141/89  Pulse: 90 76  Resp: 16 16  Temp: 97.8 F (36.6 C) 98.3 F (36.8 C)  SpO2: 100% 99%   General Appearance: In no apparent distress, laying in bed, chronic ill appearing, weak and fatigued  HEENT: Normocephalic, atraumatic.  Neck: Supple, trachea midline, no JVDs Cardiovascular: Irregularly irregular, normal S1-S2,  no murmur Respiratory: Resting breathing unlabored, lungs sounds clear to auscultation bilaterally, on 3L [place] oxygen, moist non-productive cough noted  Gastrointestinal: Bowel sounds positive, abdomen soft  Extremities:  BLE trace edema noted Musculoskeletal: Normal muscle bulk and tone, global weakness and decondition Skin: Intact, warm, dry. No rashes Neurologic: Alert, oriented to self. Answers yes and no question, follows one step commands, poor historian Psychiatric: calm  EKG:  The EKG was personally reviewed and demonstrates:  EKG from 02/20/23 with A fib RVR 119 bpm  Telemetry:  Telemetry was personally reviewed and demonstrates:    A fib RVR 100-110s currently, noted RVR up to 140s for 2 minutes prior   Relevant CV Studies:   Echo from 11/29/22:   1. Left ventricular ejection fraction, by estimation, is 55%. The left  ventricle has normal function. The left ventricle has no regional wall  motion abnormalities. Left ventricular diastolic parameters are  indeterminate.   2. Right ventricular systolic function is normal. The right ventricular  size is normal.   3. Left atrial size was mildly dilated.   4. The mitral valve is abnormal. Moderate mitral valve regurgitation. No  evidence of mitral stenosis.   5. The aortic valve is tricuspid. There is moderate calcification of the  aortic valve. There is moderate thickening of the aortic valve. Aortic  valve regurgitation is not visualized. Aortic valve sclerosis is present,  with no evidence of aortic valve  stenosis.   6. The inferior vena cava is dilated in size with >50% respiratory  variability, suggesting right atrial pressure of 8 mmHg.    Laboratory Data:  High Sensitivity Troponin:   Recent Labs  Lab 02/20/23 1213 02/20/23 1343  TROPONINIHS 56* 58*     Chemistry Recent Labs  Lab 02/20/23 1213 02/20/23 1219 02/20/23 2046 02/21/23 0100 02/22/23 0356  NA 137 139  --  139 139  K 3.4* 3.4*  --  3.2* 3.7  CL 101  --   --  102 102  CO2 25  --   --  24 27  GLUCOSE 108*  --   --  94 89  BUN 20  --   --  19 20  CREATININE 2.41*  --   --  2.24* 2.43*  CALCIUM 8.3*  --   --  8.1* 8.4*  MG  --   --  1.5*  --  1.9  GFRNONAA  19*  --   --  21* 19*  ANIONGAP 11  --   --  13 10    Recent Labs  Lab 02/20/23 1213  PROT 6.6  ALBUMIN 2.6*  AST 25  ALT 15  ALKPHOS 145*  BILITOT 0.9   Lipids No results for input(s): "CHOL", "TRIG", "HDL", "LABVLDL", "LDLCALC", "CHOLHDL" in the last 168 hours.  Hematology Recent Labs  Lab 02/20/23 1213 02/20/23 1219 02/21/23 0100 02/22/23 0356  WBC 5.2  --  4.1 5.0  RBC 3.57*  --  3.64* 3.53*  HGB 9.6* 10.9* 9.8* 9.5*  HCT 31.1* 32.0* 32.3* 31.5*  MCV 87.1  --  88.7 89.2  MCH 26.9  --  26.9 26.9  MCHC 30.9  --  30.3 30.2  RDW 17.2*  --  17.1* 17.2*  PLT 190  --  173 191   Thyroid No results for input(s): "TSH", "FREET4" in the last 168 hours.  BNP Recent Labs  Lab 02/20/23 1213  BNP 2,009.3*    DDimer No results for input(s): "DDIMER" in the last 168 hours.   Radiology/Studies:  DG Chest Portable 1 View  Result Date: 02/20/2023 CLINICAL DATA:  Dyspnea EXAM: PORTABLE CHEST 1 VIEW COMPARISON:  Previous studies including the examination of 01/14/2023 FINDINGS: Transverse diameter of heart is increased. Central pulmonary vessels are prominent. There are no signs of alveolar pulmonary edema. No new focal infiltrates are seen. There is no significant pleural effusion or pneumothorax. Left hemidiaphragm is elevated. IMPRESSION:  Cardiomegaly. There are no signs of alveolar pulmonary edema or new focal pulmonary consolidation. Electronically Signed   By: Ernie Avena M.D.   On: 02/20/2023 12:44     Assessment and Plan:   Acute on chronic diastolic heart failure -presented with shortness of breath, orthopnea, leg edema x 2 weeks - BNP 2009, POA - CXR no acute finding, POA - Echo with LVEF 55% on 11/29/22  - s/p IV Lasix, now back on PO lasix 40mg  daily, Net -2.6L, weight down from 161 >159 ibs - clinically mildly hypervolemic, clinical picture maybe due to COVID-19 pneumonia +/- decondition/hypoalbuminemia    - GDMT: continue PO Lasix 40mg , may increase to BID if  needed, consider goals of care discussion, no further escalation of care  Persistent A-fib with RVR -History of persistent A-fib, last seen in Jan 2024 for RVR in the setting of flu, did receive cardioversion at ED followed by bradycardia/hypotension requiring vasopressor support  - Historically on amiodarone and metoprolol and low-dose Eliquis - Currently rate is reasonable 100- 110s during acute COVID-19 infection/hypoxia, recommend treat underlying cause, re-load IV amiodarone for persistent RVR if needed, agree with increasing metoprolol 100mg  BID for rate control  - Keep K >4 and Mag >2  - continue low dose Eliquis 2.5mg  BID  Elevated troponin  - Hs trop 56 >58 - demand ischemia  Acute on chronic hypoxic respiratory failure  CKD IV COVID -19 infection  HTN  Anemia  Cognitive impairment  Debility  Hx of CVA  - per primary team      Risk Assessment/Risk Scores:    New York Heart Association (NYHA) Functional Class NYHA Class II  CHA2DS2-VASc Score = 7   This indicates a 11.2% annual risk of stroke. The patient's score is based upon: CHF History: 1 HTN History: 1 Diabetes History: 0 Stroke History: 2 Vascular Disease History: 0 Age Score: 2 Gender Score: 1     For questions or updates, please contact [facility] Please consult [email or web address] for contact info under    Signed, Cyndi Bender, NP  02/22/2023 4:40 PM   Agree with note by Cyndi Bender NP  Asked to evaluate fifth Allyson Sabal on chronically ill-appearing 87 year old African-American female who is a DNR and currently resides in a nursing home because of A-fib with RVR.  She has had multiple admissions in the past for diastolic heart failure and A-fib.  She has been in hospice care in the past as well.  She apparently has liver lesions by ultrasound which have not had a tissue diagnosis.  She was admitted with COVID 5 days ago.  Her BNP was elevated at 2000.  She received IV Lasix and had a diuresis  approxione 0.7 L.  She does not appear volume overloaded at the current time.  She is on renally adjusted Eliquis as an outpatient.  Currently she says she feels clinically improved.  She does have decreased breath sounds bilaterally.  She has no peripheral edema.  Heart rate is 7120 range on p.o. amiodarone and IV diltiazem.  Her metoprolol dose has recently been increased from 50 to 100 mg p.o. twice daily for rate control.  She is on 3 L nasal cannula.  Her chest x-ray shows no infiltrates currently.  At this point, I will continue to continue to treat her conservatively.  Agree with Allyson Sabal rate controlled medications including her diltiazem drip, amiodarone and metoprolol.  Continue p.o. Lasix.  Agree with pursuing goals of care and hospice/palliative care.  Delton See.  Allyson Sabal, M.D., FACP, [facility], [facility] [address] [place], [place]  [address]  [phone] 02/22/2023 4:55 PM

## 2023-02-23 DIAGNOSIS — I480 Paroxysmal atrial fibrillation: Secondary | ICD-10-CM

## 2023-02-23 LAB — BASIC METABOLIC PANEL
Anion gap: 7 (ref 5–15)
BUN: 25 mg/dL — ABNORMAL HIGH (ref 8–23)
CO2: 28 mmol/L (ref 22–32)
Calcium: 8.4 mg/dL — ABNORMAL LOW (ref 8.9–10.3)
Chloride: 104 mmol/L (ref 98–111)
Creatinine, Ser: 2.63 mg/dL — ABNORMAL HIGH (ref 0.44–1.00)
GFR, Estimated: 17 mL/min — ABNORMAL LOW (ref >60)
Glucose, Bld: 99 mg/dL (ref 70–99)
Potassium: 3.9 mmol/L (ref 3.5–5.1)
Sodium: 139 mmol/L (ref 135–145)

## 2023-02-23 LAB — CBC
HCT: 31 % — ABNORMAL LOW (ref 36.0–46.0)
Hemoglobin: 9.7 g/dL — ABNORMAL LOW (ref 12.0–15.0)
MCH: 26.9 pg (ref 26.0–34.0)
MCHC: 31.3 g/dL (ref 30.0–36.0)
MCV: 85.9 fL (ref 80.0–100.0)
Platelets: 215 10*3/uL (ref 150–400)
RBC: 3.61 MIL/uL — ABNORMAL LOW (ref 3.87–5.11)
RDW: 16.8 % — ABNORMAL HIGH (ref 11.5–15.5)
WBC: 6.8 10*3/uL (ref 4.0–10.5)
nRBC: 0 % (ref 0.0–0.2)

## 2023-02-23 MED ORDER — METOPROLOL TARTRATE 100 MG PO TABS
100.0000 mg | ORAL_TABLET | Freq: Two times a day (BID) | ORAL | Status: DC
  Administered 2023-02-23 – 2023-02-24 (×2): 100 mg via ORAL
  Filled 2023-02-23 (×2): qty 1

## 2023-02-23 MED ORDER — METOPROLOL TARTRATE 50 MG PO TABS: 150.0000 mg | ORAL_TABLET | Freq: Two times a day (BID) | ORAL | Status: DC

## 2023-02-23 NOTE — Progress Notes (Signed)
[facility]) Hospice hospital liaison note  Patient referred for hospice services prior to hospitalization. [facility] liaison will follow for discharge planning.   Thea Gist, BSN, [employer] hospital liaison [phone]

## 2023-02-23 NOTE — Progress Notes (Addendum)
Rounding Note    Patient Name: Jillian [name] Date of Encounter: 02/23/2023  [facility] Cardiologist: Dietrich PatesPaula Ross, MD   Subjective   No complaints this morning. Sitting up in chair.   Inpatient Medications    Scheduled Meds:  amiodarone  200 mg Oral Daily   amLODipine  5 mg Oral Daily   apixaban  2.5 mg Oral BID   furosemide  40 mg Oral Daily   metoprolol tartrate  100 mg Oral BID   Continuous Infusions:  diltiazem (CARDIZEM) infusion 5 mg/hr (02/22/23 1022)   PRN Meds: acetaminophen **OR** acetaminophen, haloperidol, ipratropium-albuterol, LORazepam, senna-docusate   Vital Signs    Vitals:   02/22/23 1530 02/22/23 2027 02/23/23 0109 02/23/23 0510  BP: (!) 141/89 (!) 132/98 127/80 128/81  Pulse: 76 64 (!) 117 96  Resp: 16 18 17 18   Temp: 98.3 F (36.8 C) 98.2 F (36.8 C) 98.3 F (36.8 C) 98.3 F (36.8 C)  TempSrc: Oral Oral Oral Oral  SpO2: 99% 91% 94% 96%  Weight:    70.7 kg  Height:        Intake/Output Summary (Last 24 hours) at 02/23/2023 0851 Last data filed at 02/23/2023 0800 Gross per 24 hour  Intake 278.89 ml  Output 1100 ml  Net -821.11 ml      02/23/2023    5:10 AM 02/21/2023   11:40 PM 02/21/2023    4:37 AM  Last 3 Weights  Weight (lbs) 155 lb 13.8 oz 159 lb 6.3 oz 161 lb 13.1 oz  Weight (kg) 70.7 kg 72.3 kg 73.4 kg      Telemetry    Atrial fibrillation rates 100-110s - Personally Reviewed  ECG    No new tracing  Physical Exam   GEN: No acute distress, chronically ill appearing older female. On 3.5L [place].  Neck: No JVD Cardiac: Irreg Irreg, no murmurs, rubs, or gallops.  Respiratory: Clear to auscultation bilaterally. GI: Soft, nontender, non-distended  MS: No edema; No deformity. Neuro:  Nonfocal  Psych: Normal affect   Labs    High Sensitivity Troponin:   Recent Labs  Lab 02/20/23 1213 02/20/23 1343  TROPONINIHS 56* 58*     Chemistry Recent Labs  Lab 02/20/23 1213 02/20/23 1219 02/20/23 2046 02/21/23 0100  02/22/23 0356 02/23/23 0108  NA 137   < >  --  139 139 139  K 3.4*   < >  --  3.2* 3.7 3.9  CL 101  --   --  102 102 104  CO2 25  --   --  24 27 28   GLUCOSE 108*  --   --  94 89 99  BUN 20  --   --  19 20 25*  CREATININE 2.41*  --   --  2.24* 2.43* 2.63*  CALCIUM 8.3*  --   --  8.1* 8.4* 8.4*  MG  --   --  1.5*  --  1.9  --   PROT 6.6  --   --   --   --   --   ALBUMIN 2.6*  --   --   --   --   --   AST 25  --   --   --   --   --   ALT 15  --   --   --   --   --   ALKPHOS 145*  --   --   --   --   --   BILITOT 0.9  --   --   --   --   --  GFRNONAA 19*  --   --  21* 19* 17*  ANIONGAP 11  --   --  13 10 7    < > = values in this interval not displayed.    Lipids No results for input(s): "CHOL", "TRIG", "HDL", "LABVLDL", "LDLCALC", "CHOLHDL" in the last 168 hours.  Hematology Recent Labs  Lab 02/21/23 0100 02/22/23 0356 02/23/23 0108  WBC 4.1 5.0 6.8  RBC 3.64* 3.53* 3.61*  HGB 9.8* 9.5* 9.7*  HCT 32.3* 31.5* 31.0*  MCV 88.7 89.2 85.9  MCH 26.9 26.9 26.9  MCHC 30.3 30.2 31.3  RDW 17.1* 17.2* 16.8*  PLT 173 191 215   Thyroid No results for input(s): "TSH", "FREET4" in the last 168 hours.  BNP Recent Labs  Lab 02/20/23 1213  BNP 2,009.3*    DDimer No results for input(s): "DDIMER" in the last 168 hours.   Radiology    No results found.  Cardiac Studies   N/a   Patient Profile     87 y.o. female with a hx of CKD IV, chronic diastolic heart failure, CVA, HTN, persistent A fib, chronic hypoxic respiratory failure on 2 L nasal cannula, dementia, liver lesions,  who is being seen 02/22/2023 for the evaluation of A fib RVR at the request of Dr Antony Contras.   Assessment & Plan    Acute on chronic diastolic heart failure -- presented with shortness of breath, orthopnea, leg edema x 2 weeks, BNP 2009, CXR no acute finding. -- Echo with LVEF 55% on 11/29/22  -- s/p IV Lasix, now back on PO lasix 40mg  daily, Net -3.4L, weight down from 161 >155 lbs -- overall appears  euvolemic at this time    -- GDMT: continue PO Lasix 40mg , may increase to BID if needed, consider goals of care discussion, no further escalation of care   Persistent A-fib with RVR -- History of persistent A-fib, last seen in Jan 2024 for RVR in the setting of flu, did receive cardioversion at ED followed by bradycardia/hypotension requiring vasopressor support  -- Historically on amiodarone and metoprolol and low-dose Eliquis -- Currently rate is reasonable 100- 110s during acute COVID-19 infection/hypoxia, recommend treat underlying cause -- currently on IV Diltiazem @5mg /hr, would favor stopping Dilt and increase metoprolol further if needed for rate control. Stop amlodipine to allow for additional BP room to increase metoprolol if needed. -- metoprolol 100mg  BID -- Keep K >4 and Mag >2  -- continue low dose Eliquis 2.5mg  BID   Elevated troponin  -- Hs trop 56 >58 -- demand ischemia  Per Primary Acute on chronic hypoxic respiratory failure  CKD IV COVID -19 infection  HTN  Anemia  Cognitive impairment  Debility  Hx of CVA    For questions or updates, please contact [facility] Please consult [email or web address] for contact info under        Signed, Laverda Page, NP  02/23/2023, 8:51 AM    Agree with note by Laverda Page NP-C  Patient admitted with COVID.  She has a history of PAF and was admitted with RVR.  She was on renally adjusted Eliquis.  She does have normal LV systolic function with diastolic dysfunction.  Her troponins were mildly elevated but flat.  She was diuresed 3 to 4 L with appropriate weight loss.  She is on amiodarone.  Beta-blocker was uptitrated.  Agree with discontinuing IV diltiazem as well as amlodipine.  Also agree with goals of care discussion.  No further recommendations at this time.   Christiane Ha  Erlene Quan, M.D., FACP, [facility], [facility] [address] [place], [place]   [address]  [phone] 02/23/2023 11:04 AM

## 2023-02-23 NOTE — Progress Notes (Addendum)
Subjective:  Jillian [name] was feeling fine this morning, awake and responded appropriately to most questions. Oriented to person, city, and year but stated month as March. Denied experiencing pain, discomfort, and breath shortness. Discussed plan to continue controlling heart rate with medications and speak with daughter later today about discharge plans.     Objective: Vitals over previous 24hr: Vitals:   02/22/23 1530 02/22/23 2027 02/23/23 0109 02/23/23 0510  BP: (!) 141/89 (!) 132/98 127/80 128/81  Pulse: 76 64 (!) 117 96  Resp: 16 18 17 18   Temp: 98.3 F (36.8 C) 98.2 F (36.8 C) 98.3 F (36.8 C) 98.3 F (36.8 C)  TempSrc: Oral Oral Oral Oral  SpO2: 99% 91% 94% 96%  Weight:    70.7 kg  Height:        General:      awake and interactive, sitting comfortably in chair, not in acute distress Lungs:      normal respiratory effort on 2.5L/min supplemental oxygen, breathing unlabored, no wheezing Cardiac:      tachycardic with irregular rhythm, normal S1 and S2, trace pitting edema in BLE Abdominal:      soft and nondistended, hypoactive bowel sounds, no tenderness to palpation or guarding Neurologic:      oriented to person-city-year, moving all extremities, no gross focal deficits Psychiatric:      euthymic mood with blunted affect, speech slow but intelligible    Assessment/Plan: Jillian [name] is an 87 year old female with a past medical history of hypertension, CKD IV, HFpEF, AFib, chronic respiratory failure, and dementia who presented with breath shortness, now admitted for treatment of acute heart failure exacerbation and atrial fibrillation with rapid ventricular response.    ---Acute on chronic heart failure preserved ejection fraction ---Chronic respiratory failure Patient has history of heart failure preserved ejection fraction, echocardiogram performed 11-2022 demonstrated EF 55 without regional wall motion abnormality. She presented with breath shortness, leg  swelling, and orthopnea that started about two weeks ago. Upon arrival, laboratory testing revealed BNP 2009 and Trop 46-56-58, ECG negative for ischemic changes. Chest radiograph without any consolidation or effusion. Exam demonstrated BLE edema and bibasilar crackles. Oxygen requirement remains home nasal cannula at 2L/min. Etiology of heart failure exacerbation most likely recent COVID infection given symptom timeline. Atrial fibrillation possibly contributing as well. Intravenous furosemide brought weight down from 73.4kg to 70.7kg, after which patient appeared euvolemic with only trace pitting edema. Home furosemide dose of 40mg  oral was resumed on 4-9.   > Furosemide 40mg  q24  > Ipratropium-albuterol 0.5-2.5mg  q4 PRN  > Supplemental oxygen via [place] to maintain SpO2 >92  > Trend BMP q24  > Monitor ins-outs and daily weights  > Incentive spirometry and flutter valve   ---Atrial fibrillation with rapid ventricular response Patient has history of atrial fibrillation managed at home with amiodarone and metoprolol. Upon arrival, ECG demonstrated atrial fibrillation with HR 119. Etiology unclear, possibilities include heart failure exacerbation and COVID. Home medications and diltiazem drip were started, HR improved to around 100. Metoprolol dose increased from 50mg  to 100mg , HR has improved with these interventions but remains poorly controlled frequently peaking in the 110s at rest. Diltiazem discontinued and metoprolol dose increased on 4-10 per cardiology recommendations. Daughter contacted today 4-10, decided on returning to [facility] under hospice care.   > Cardiology consult, appreciate recommendations  > Amiodarone 200mg  q24  > Metoprolol 100 mg q12  > Apixaban 2.5mg  q12  > Cardiac telemetry   ---Mild dysphagia  Patient has demonstrated some  difficulty swallowing pills and food residue was present in her mouth long after meal consumption. She would benefit from further evaluation for  dysphagia to minimize aspiration risk and improve medication adherence. Evaluation by speech-language has revealed mild dysphagia and moderate aspiration risk, recommending soft diet.  > Diet dysphagia III   ---Coronavirus disease Patient reports breath shortness for about two weeks, diagnosed with COVID on 4-4 and was given molnupiravir Chest radiograph negative for signs of pneumonia and WBC count has remained within normal limits since admission. Exam demonstrated bibasilar crackles and patient remains afebrile.   > Trend CBC q48   ---Chronic kidney disease IV Patient has history of chronic kidney disease, baseline creatinine around 2-3 and GFR 10-20. Upon arrival, creatinine 2.41 and GFR 21 both at approximate baseline. Creatinine has fluctuated within a narrow range, but remained stable since admission.   > Trend BMP q24   ---Hypertension Patient has history of hypertension managed at home with amlodipine and metoprolol. Blood pressure has been stable in the slightly elevated range throughout hospitalization. Amlodipine discontinued on 4-10 to accommodate increased metoprolol dose.  > Metoprolol 150mg  q12   ---Normocytic anemia Patient has chronic anemia, baseline hemoglobin around 9.0-10, likely secondary to CKD IV. Hemoglobin has remained at baseline throughout hospitalization.  > Trend CBC q48   ---Generalized debility ---Dementia Patient has had several hospitalizations earlier this year, previously discharged with hospice care at [facility]. She improved under hospice care and was subsequently transitioned to short-term rehabilitation at [facility]. Exam demonstrated orientation to person, year, and general setting. Physical and occupational therapy teams are both recommending discharge to SNF.  > Physical-occupational therapy   > Discharge to [facility]     Principal Problem:   Acute on chronic heart failure with preserved ejection fraction (HFpEF) Active  Problems:   CKD (chronic kidney disease) stage 4, GFR 15-29 ml/min   Paroxysmal atrial fibrillation with RVR    Prior to Admission Living Arrangement: [facility] Anticipated Discharge Location: [facility] Barriers to Discharge: atrial fibrillation management Dispo: Anticipated discharge in approximately 1-2 day(s).    Lajuana Ripple, MD Internal Medicine PGY-1 Pager [phone]  After 5pm on weekdays and 1pm on weekends: On Call pager [phone]

## 2023-02-23 NOTE — TOC Progression Note (Signed)
Transition of Care [facility]) - Progression Note    Patient Details  Name: Jillian [name] MRN: [id number] Date of Birth: [birth year]  Transition of [facility] CM/SW Contact  Leander Rams, LCSW Phone Number: 02/23/2023, 12:06 PM  Clinical Narrative:    CSW was inform by medical team that pt would return back to SNF with hospice. CSW spoke with daughter regarding pt return back to SNF.  CSW communicated with daughter that pt could not return to STR with hospice as well, unless family privately pays. Daughter reported pt will return under STR and then transition to hospice, once STR is completed.   CSW has restarted insurance auth for pt to return back to [facility]. TOC will continue to follow this admission.         Expected Discharge Plan and Services                                               Social Determinants of Health (SDOH) Interventions SDOH Screenings   Depression (PHQ2-9): Low Risk  (11/11/2022)  Tobacco Use: Low Risk  (01/14/2023)    Readmission Risk Interventions    12/08/2022   11:52 AM  Readmission Risk Prevention Plan  Transportation Screening Complete  PCP or Specialist Appt within 3-5 Days Not Complete  Not Complete comments home w/ hospice  HRI or Home Care Consult Complete  Social Work Consult for Recovery Care Planning/Counseling Complete  Palliative Care Screening Complete  Medication Review [employer]) Complete   Oletta Lamas, MSW, LCSWA, LCASA Transitions of Care  Clinical Social Worker I

## 2023-02-23 NOTE — Evaluation (Signed)
Clinical/Bedside Swallow Evaluation Patient Details  Name: Jillian [name] MRN: [id number] Date of Birth: 10-21-1936  Today's Date: 02/23/2023 Time: SLP Start Time (ACUTE ONLY): 0810 SLP Stop Time (ACUTE ONLY): 0835 SLP Time Calculation (min) (ACUTE ONLY): 25 min  Past Medical History:  Past Medical History:  Diagnosis Date   Acute CVA (cerebrovascular accident) (HCC) 07/12/2020   Hypertension    Renal disorder    CKD Stage IV   Stroke [facility])    Past Surgical History:  Past Surgical History:  Procedure Laterality Date   CARDIAC CATHETERIZATION  2009   HPI:  Patient is an 87 year old female admitted to [facility] with respite issues.  Did patient found to have COVID.  Did past medical history includes heart failure, hypertension, CRF, A-fib, dementia and CVA.  She was discharged in March with hospice.  Family is deciding regarding comfort care per notes.  She has previously seen SLP for swallow evaluations and reported globus, therefore esophageal workup advised to be considered.  Pt has Encephalomalacia  from prior parietal and temporal lobes on the right.  Also old  infarcts are present in the corona radiata and basal ganglia on the  right.    Assessment / Plan / Recommendation  Clinical Impression  Patient presents with clinical indications of mild oral dysphagia and suspected intact pharyngeal ability.  She did not pass Yale swallow screen due to requiring rest break.  She is noted to be congested at baseline also with acute cough effective to clear but patient reports it is more effort than its worth. Minimal intake observed including 3 ounces of water 1 ounce grape juice, 2 bites of applesauce, 2 small bite of graham cracker.  Prolonged mastication noted with oral retention with patient benefiting from cues to swallow liquids to clear.  Patient again endorses history of globus have any workup (oral pharyngeal or esophageal) .  She admits that she does not desire to eat and  therefore adequacy of nature at this time is concerning.  Recommend Dys3/thin with strict precautions.  SLP cannot rule out silent aspiration given patient's history of basal ganglia CVA in particular.  However given patient's multiple comorbidities, prior hospitalist input, optimal treatment may be to continue diet using compensation strategies for maximal patient's comfort.  No family present at this time therefore we will follow-up once for education. SLP Visit Diagnosis: Dysphagia, unspecified (R13.10)    Aspiration Risk  Moderate aspiration risk    Diet Recommendation Dysphagia 3 (Mech soft);Thin liquid   Liquid Administration via: Cup;Straw Medication Administration: Crushed with puree Supervision: Patient able to self feed Compensations: Slow rate;Small sips/bites Postural Changes: Seated upright at 90 degrees;Remain upright for at least 30 minutes after po intake    Other  Recommendations Oral Care Recommendations: Oral care BID    Recommendations for follow up therapy are one component of a multi-disciplinary discharge planning process, led by the attending physician.  Recommendations may be updated based on patient status, additional functional criteria and insurance authorization.  Follow up Recommendations Follow physician's recommendations for discharge plan and follow up therapies      Assistance Recommended at Discharge    Functional Status Assessment Patient has had a recent decline in their functional status and/or demonstrates limited ability to make significant improvements in function in a reasonable and predictable amount of time  Frequency and Duration min 1 x/week  1 week       Prognosis Prognosis for improved oropharyngeal function: Fair Barriers to Reach Goals: Other (Comment) (medical  diagnosis)      Swallow Study   General HPI: Patient is an 87 year old female admitted to [facility] with respite issues.  Did patient found to have COVID.  Did past  medical history includes heart failure, hypertension, CRF, A-fib, dementia and CVA.  She was discharged in March with hospice.  Family is deciding regarding comfort care per notes.  She has previously seen SLP for swallow evaluations and reported globus, therefore esophageal workup advised to be considered.  Pt has Encephalomalacia  from prior parietal and temporal lobes on the right.  Also old  infarcts are present in the corona radiata and basal ganglia on the  right. Type of Study: Bedside Swallow Evaluation Previous Swallow Assessment: Multiple prior swallow evaluations clinical,  no instrumental eval's Diet Prior to this Study: NPO Temperature Spikes Noted: No Respiratory Status: Nasal cannula (3.5) Behavior/Cognition: Alert;Cooperative;Other (Comment) (Flat affect) Oral Cavity Assessment: Other (comment) (Patient appeared with minimal tan-colored secretions in mouth.  SLP brushed her teeth with improved clearance.  Patient denies odynophagia.) Oral Care Completed by SLP: Yes Oral Cavity - Dentition: Adequate natural dentition Vision: Functional for self-feeding Self-Feeding Abilities: Able to feed self Patient Positioning: Upright in bed Baseline Vocal Quality: Low vocal intensity Volitional Cough: Strong (Productive to secretions, both thin and viscous tan-colored.) Volitional Swallow: Able to elicit    Oral/Motor/Sensory Function Overall Oral Motor/Sensory Function: Other (comment) (right facial asymmetry)   Ice Chips Ice chips: Within functional limits Presentation: Spoon   Thin Liquid Thin Liquid: Within functional limits Presentation: Straw;Self Fed Other Comments: delayed cough  noted x1, did not appear to be directly coorelated to po intake    Nectar Thick Nectar Thick Liquid: Not tested   Honey Thick Honey Thick Liquid: Not tested   Puree Puree: Within functional limits Presentation: Self Fed;Spoon   Solid     Solid: Impaired Oral Phase Impairments: Other  (comment);Reduced lingual movement/coordination (prolonged mastication) Oral Phase Functional Implications: Oral residue (cleared with liquid swallow)      Chales Abrahams 02/23/2023,9:03 AM   Jillian Infante, MS [facility] SLP Acute Rehab Services Office [phone]

## 2023-02-24 DIAGNOSIS — I4891 Unspecified atrial fibrillation: Secondary | ICD-10-CM | POA: Diagnosis not present

## 2023-02-24 DIAGNOSIS — F03911 Unspecified dementia, unspecified severity, with agitation: Secondary | ICD-10-CM | POA: Diagnosis not present

## 2023-02-24 DIAGNOSIS — N390 Urinary tract infection, site not specified: Secondary | ICD-10-CM | POA: Diagnosis not present

## 2023-02-24 DIAGNOSIS — R279 Unspecified lack of coordination: Secondary | ICD-10-CM | POA: Diagnosis not present

## 2023-02-24 DIAGNOSIS — N184 Chronic kidney disease, stage 4 (severe): Secondary | ICD-10-CM | POA: Diagnosis not present

## 2023-02-24 DIAGNOSIS — I10 Essential (primary) hypertension: Secondary | ICD-10-CM | POA: Diagnosis not present

## 2023-02-24 DIAGNOSIS — L89153 Pressure ulcer of sacral region, stage 3: Secondary | ICD-10-CM | POA: Diagnosis not present

## 2023-02-24 DIAGNOSIS — I5023 Acute on chronic systolic (congestive) heart failure: Secondary | ICD-10-CM | POA: Diagnosis not present

## 2023-02-24 DIAGNOSIS — I5033 Acute on chronic diastolic (congestive) heart failure: Secondary | ICD-10-CM | POA: Diagnosis not present

## 2023-02-24 DIAGNOSIS — I480 Paroxysmal atrial fibrillation: Secondary | ICD-10-CM | POA: Diagnosis not present

## 2023-02-24 DIAGNOSIS — M79601 Pain in right arm: Secondary | ICD-10-CM | POA: Diagnosis not present

## 2023-02-24 DIAGNOSIS — R1312 Dysphagia, oropharyngeal phase: Secondary | ICD-10-CM | POA: Diagnosis not present

## 2023-02-24 DIAGNOSIS — U071 COVID-19: Secondary | ICD-10-CM | POA: Diagnosis not present

## 2023-02-24 DIAGNOSIS — M79672 Pain in left foot: Secondary | ICD-10-CM | POA: Diagnosis not present

## 2023-02-24 DIAGNOSIS — R239 Unspecified skin changes: Secondary | ICD-10-CM | POA: Diagnosis not present

## 2023-02-24 DIAGNOSIS — I5032 Chronic diastolic (congestive) heart failure: Secondary | ICD-10-CM | POA: Diagnosis not present

## 2023-02-24 DIAGNOSIS — Z743 Need for continuous supervision: Secondary | ICD-10-CM | POA: Diagnosis not present

## 2023-02-24 DIAGNOSIS — R296 Repeated falls: Secondary | ICD-10-CM | POA: Diagnosis not present

## 2023-02-24 DIAGNOSIS — S31000A Unspecified open wound of lower back and pelvis without penetration into retroperitoneum, initial encounter: Secondary | ICD-10-CM | POA: Diagnosis not present

## 2023-02-24 DIAGNOSIS — J9610 Chronic respiratory failure, unspecified whether with hypoxia or hypercapnia: Secondary | ICD-10-CM | POA: Diagnosis not present

## 2023-02-24 DIAGNOSIS — M6281 Muscle weakness (generalized): Secondary | ICD-10-CM | POA: Diagnosis not present

## 2023-02-24 DIAGNOSIS — M79671 Pain in right foot: Secondary | ICD-10-CM | POA: Diagnosis not present

## 2023-02-24 DIAGNOSIS — D631 Anemia in chronic kidney disease: Secondary | ICD-10-CM | POA: Diagnosis not present

## 2023-02-24 DIAGNOSIS — Z7409 Other reduced mobility: Secondary | ICD-10-CM | POA: Diagnosis not present

## 2023-02-24 DIAGNOSIS — R488 Other symbolic dysfunctions: Secondary | ICD-10-CM | POA: Diagnosis not present

## 2023-02-24 DIAGNOSIS — R531 Weakness: Secondary | ICD-10-CM | POA: Diagnosis not present

## 2023-02-24 DIAGNOSIS — I509 Heart failure, unspecified: Secondary | ICD-10-CM | POA: Diagnosis not present

## 2023-02-24 DIAGNOSIS — R410 Disorientation, unspecified: Secondary | ICD-10-CM | POA: Diagnosis not present

## 2023-02-24 DIAGNOSIS — Z7401 Bed confinement status: Secondary | ICD-10-CM | POA: Diagnosis not present

## 2023-02-24 DIAGNOSIS — E559 Vitamin D deficiency, unspecified: Secondary | ICD-10-CM | POA: Diagnosis not present

## 2023-02-24 DIAGNOSIS — J9620 Acute and chronic respiratory failure, unspecified whether with hypoxia or hypercapnia: Secondary | ICD-10-CM | POA: Diagnosis not present

## 2023-02-24 DIAGNOSIS — I5020 Unspecified systolic (congestive) heart failure: Secondary | ICD-10-CM | POA: Diagnosis not present

## 2023-02-24 LAB — BASIC METABOLIC PANEL
Anion gap: 12 (ref 5–15)
BUN: 28 mg/dL — ABNORMAL HIGH (ref 8–23)
CO2: 27 mmol/L (ref 22–32)
Calcium: 8.3 mg/dL — ABNORMAL LOW (ref 8.9–10.3)
Chloride: 102 mmol/L (ref 98–111)
Creatinine, Ser: 2.75 mg/dL — ABNORMAL HIGH (ref 0.44–1.00)
GFR, Estimated: 16 mL/min — ABNORMAL LOW (ref >60)
Glucose, Bld: 80 mg/dL (ref 70–99)
Potassium: 3.5 mmol/L (ref 3.5–5.1)
Sodium: 141 mmol/L (ref 135–145)

## 2023-02-24 LAB — MAGNESIUM: Magnesium: 1.9 mg/dL (ref 1.7–2.4)

## 2023-02-24 MED ORDER — ORAL CARE MOUTH RINSE: 15.0000 mL | OROMUCOSAL | Status: DC | PRN

## 2023-02-24 MED ORDER — POTASSIUM CHLORIDE CRYS ER 20 MEQ PO TBCR: 20.0000 meq | EXTENDED_RELEASE_TABLET | Freq: Every day | ORAL | 0 refills | Status: AC

## 2023-02-24 MED ORDER — ORAL CARE MOUTH RINSE
15.0000 mL | OROMUCOSAL | Status: DC
  Administered 2023-02-24: 15 mL via OROMUCOSAL

## 2023-02-24 MED ORDER — METOPROLOL TARTRATE 100 MG PO TABS: 100.0000 mg | ORAL_TABLET | Freq: Two times a day (BID) | ORAL | 0 refills | Status: AC

## 2023-02-24 MED ORDER — POTASSIUM CHLORIDE CRYS ER 20 MEQ PO TBCR: 20.0000 meq | EXTENDED_RELEASE_TABLET | Freq: Every day | ORAL | 0 refills | Status: DC

## 2023-02-24 MED ORDER — SENNOSIDES-DOCUSATE SODIUM 8.6-50 MG PO TABS: 1.0000 | ORAL_TABLET | Freq: Every evening | ORAL | 0 refills | Status: AC | PRN

## 2023-02-24 MED ORDER — POTASSIUM CHLORIDE 20 MEQ PO PACK
40.0000 meq | PACK | Freq: Once | ORAL | Status: AC
  Administered 2023-02-24: 40 meq via ORAL
  Filled 2023-02-24: qty 2

## 2023-02-24 MED ORDER — ORAL CARE MOUTH RINSE: 15.0000 mL | Freq: Four times a day (QID) | OROMUCOSAL | 0 refills | Status: AC

## 2023-02-24 NOTE — Progress Notes (Cosign Needed Addendum)
1mg  Ativan pulled from pyxis on 4/7 but not administered. Wasted medication with Bartholomew Crews, RN as witness.

## 2023-02-24 NOTE — Progress Notes (Signed)
After getting to bedside commode, pt short of breath and remained feeling short of breath after resting in bed a while. Respirations unlabored but somewhat shallow and rhonchi bilaterally. Sats 94-100% 3Lnc. Has a weak congested, occassionally productive cough. Encouraged coughing/deep breathing to expectorate sputum.  Given flutter valve and instructed on purpose and use but pt refused to try it.   Given duoneb nebulizer and shortness of breath resolved.

## 2023-02-24 NOTE — Progress Notes (Addendum)
Rounding Note    Patient Name: Jillian GreenlandMary [name] Date of Encounter: 02/24/2023  [facility] Cardiologist: Dietrich PatesPaula Ross, MD  Subjective   Patient without any complaints this morning.  Denies any chest pain, heart palpitations, racing heart rate, lightheadedness.  Patient currently on 3 L oxygen nasal cannula.  Inpatient Medications    Scheduled Meds:  amiodarone  200 mg Oral Daily   apixaban  2.5 mg Oral BID   furosemide  40 mg Oral Daily   metoprolol tartrate  100 mg Oral BID   mouth rinse  15 mL Mouth Rinse 4 times per day   potassium chloride  40 mEq Oral Once   Continuous Infusions:  PRN Meds: acetaminophen **OR** acetaminophen, haloperidol, ipratropium-albuterol, LORazepam, mouth rinse, senna-docusate   Vital Signs    Vitals:   02/24/23 0027 02/24/23 0330 02/24/23 0335 02/24/23 0345  BP: 130/76  121/85   Pulse: 98     Resp: (!) 22  (!) 22 (!) 22  Temp: 98.1 F (36.7 C)  97.9 F (36.6 C)   TempSrc: Oral  Oral   SpO2: 100%  94%   Weight:  68.7 kg    Height:        Intake/Output Summary (Last 24 hours) at 02/24/2023 0749 Last data filed at 02/23/2023 1451 Gross per 24 hour  Intake 319.13 ml  Output 700 ml  Net -380.87 ml      02/24/2023    3:30 AM 02/23/2023    5:10 AM 02/21/2023   11:40 PM  Last 3 Weights  Weight (lbs) 151 lb 7.3 oz 155 lb 13.8 oz 159 lb 6.3 oz  Weight (kg) 68.7 kg 70.7 kg 72.3 kg      Telemetry    Afib <110 - Personally Reviewed  ECG    NO new tracing  - Personally Reviewed  Physical Exam   GEN: No acute distress.  3.5L [place]: No JVD Cardiac: Irregularly irregular Respiratory: Clear to auscultation bilaterally. GI: Soft, nontender, non-distended  MS: No edema; No deformity. Neuro:  Nonfocal  Psych: Normal affect   Labs    High Sensitivity Troponin:   Recent Labs  Lab 02/20/23 1213 02/20/23 1343  TROPONINIHS 56* 58*     Chemistry Recent Labs  Lab 02/20/23 1213 02/20/23 1219 02/20/23 2046 02/21/23 0100  02/22/23 0356 02/23/23 0108 02/24/23 0100  NA 137   < >  --    < > 139 139 141  K 3.4*   < >  --    < > 3.7 3.9 3.5  CL 101  --   --    < > 102 104 102  CO2 25  --   --    < > 27 28 27   GLUCOSE 108*  --   --    < > 89 99 80  BUN 20  --   --    < > 20 25* 28*  CREATININE 2.41*  --   --    < > 2.43* 2.63* 2.75*  CALCIUM 8.3*  --   --    < > 8.4* 8.4* 8.3*  MG  --   --  1.5*  --  1.9  --   --   PROT 6.6  --   --   --   --   --   --   ALBUMIN 2.6*  --   --   --   --   --   --   AST 25  --   --   --   --   --   --  ALT 15  --   --   --   --   --   --   ALKPHOS 145*  --   --   --   --   --   --   BILITOT 0.9  --   --   --   --   --   --   GFRNONAA 19*  --   --    < > 19* 17* 16*  ANIONGAP 11  --   --    < > 10 7 12    < > = values in this interval not displayed.    Lipids No results for input(s): "CHOL", "TRIG", "HDL", "LABVLDL", "LDLCALC", "CHOLHDL" in the last 168 hours.  Hematology Recent Labs  Lab 02/21/23 0100 02/22/23 0356 02/23/23 0108  WBC 4.1 5.0 6.8  RBC 3.64* 3.53* 3.61*  HGB 9.8* 9.5* 9.7*  HCT 32.3* 31.5* 31.0*  MCV 88.7 89.2 85.9  MCH 26.9 26.9 26.9  MCHC 30.3 30.2 31.3  RDW 17.1* 17.2* 16.8*  PLT 173 191 215   Thyroid No results for input(s): "TSH", "FREET4" in the last 168 hours.  BNP Recent Labs  Lab 02/20/23 1213  BNP 2,009.3*    DDimer No results for input(s): "DDIMER" in the last 168 hours.   Radiology    CXR 02/20/2023 Cardiomegaly. There are no signs of alveolar pulmonary edema or new focal pulmonary consolidation.  Cardiac Studies   Echocardiogram 11/29/2022  1. Left ventricular ejection fraction, by estimation, is 55%. The left  ventricle has normal function. The left ventricle has no regional wall  motion abnormalities. Left ventricular diastolic parameters are  indeterminate.   2. Right ventricular systolic function is normal. The right ventricular  size is normal.   3. Left atrial size was mildly dilated.   4. The mitral valve is  abnormal. Moderate mitral valve regurgitation. No  evidence of mitral stenosis.   5. The aortic valve is tricuspid. There is moderate calcification of the  aortic valve. There is moderate thickening of the aortic valve. Aortic  valve regurgitation is not visualized. Aortic valve sclerosis is present,  with no evidence of aortic valve  stenosis.   6. The inferior vena cava is dilated in size with >50% respiratory  variability, suggesting right atrial pressure of 8 mmHg.   Patient Profile     87 y.o. female CKD IV, chronic diastolic heart failure, CVA, HTN, persistent A fib, chronic hypoxic respiratory failure on 2 L nasal cannula, dementia, liver lesions,  who was admitted on 02/22/2023 for the evaluation of A fib RVR and acute on chronic HFpEF at the request of Dr Antony Contras.     Assessment & Plan    Acute on chronic diastolic heart failure Patient initially presented with continued shortness of breath, orthopnea, leg edema for the past 2 weeks.  BNP 2009, chest ray with no evidence of pulmonary congestions or infiltrates.  Last echo was in 11/29/2022 that noted an LVEF of 55%.  She has been successfully diuresed on IV Lasix and now currently on her home regimen of Lasix 40 mg p.o.  Baseline weight has dropped from 161 to 151 pounds.  Overall euvolemic on exam.  Considering her hospice status and goals of care, no further escalation of GDMT. Continue home Lasix 40 mg daily  Persistent A-fib with RVR in the setting of COVID-19 Patient has prior history of persistent A-fib last seen in January 2024 with RVR in the setting of flu, she did receive cardioversion  at the ED followed by bradycardia/hypotension requiring vasopressor support.  Her home regimen is amiodarone 200 mg, metoprolol 50 mg which has been uptitrated to 100, and low-dose Eliquis renally adjusted.  Initially was on IV diltiazem which has been discontinued and uptitrated on metoprolol 100 mg twice daily.  Amlodipine has been stopped for  BP room to increase metoprolol.  Patient without any complaints of palpitations or racing heart rates.  Has some shortness of breath but appears to be around baseline. Currently well rate controlled on metoprolol 100 mg twice daily and amio 200mg  daily.  Continue low-dose renally adjusted Eliquis 2.5 mg twice daily.  Elevated troponin Troponins 56 then 58 thought to be demand ischemia.  No evidence of ACS.  No further workup  Disposition Patient will return back to hospice services.  Acute on chronic hypoxic respiratory failure currently on 3 L nasal cannula CKD stage IV COVID-19 infection Hypertension Anemia Cognitive impairment Liver lesions Hx of cryptogenic stroke    For questions or updates, please contact [facility] Please consult [email or web address] for contact info under        Signed, Abagail Kitchens, PA-C  02/24/2023, 7:49 AM     Agree with note by Yvonna Alanis PA-C  We have been consulted to follow this patient's PAF with RVR.  She is on amiodarone 200 mg a day with escalated doses of beta-blocker.  Her diltiazem was discontinued.  Her heart rate remains in the 100 range  probably driven by her pulmonary process.  She is a DNR and nursing home patient.  She is on renally adjusted Eliquis.  No further recommendations.  Will sign off but be available.  Runell Gess, M.D., FACP, [facility], [facility] [address] [place], [place]  [address]  [phone] 02/24/2023 10:18 AM

## 2023-02-24 NOTE — Care Management Important Message (Signed)
Important Message  Patient Details  Name: Jillian [name] MRN: [id number] Date of Birth: 03-17-1936   Medicare Important Message Given:  Yes     Renie Ora 02/24/2023, 2:15 PM

## 2023-02-24 NOTE — TOC Transition Note (Signed)
Transition of Care [facility]) - CM/SW Discharge Note   Patient Details  Name: Jillian [name] MRN: [id number] Date of Birth: 31-May-1936  Transition of [facility]) CM/SW Contact:  Leander Rams, LCSW Phone Number: 02/24/2023, 11:02 AM   Clinical Narrative:    [facility] will [facility] to: [facility] Anticipated DC date: 02/24/2023 Family notified: [facility] Transport by: Sharin Mons   Per MD patient ready for DC to [facility]. RN, patient, patient's family, and facility notified of DC. Discharge Summary and FL2 sent to facility. RN to call report prior to discharge [phone]. DC packet on chart. Ambulance transport requested for patient.   CSW will sign off for now as social work intervention is no longer needed. Please consult [place] again if new needs arise.    Final next level of care: Skilled Nursing Facility Barriers to Discharge: No Barriers Identified   Patient Goals and CMS Choice      Discharge Placement                Patient chooses bed at:  [facility]) Patient to be transferred to facility by: [facility] Name of family member notified: Tarnshina Patient and family notified of of transfer: 02/24/23  Discharge Plan and Services Additional resources added to the After Visit Summary for                                       Social Determinants of Health (SDOH) Interventions SDOH Screenings   Depression (PHQ2-9): Low Risk  (11/11/2022)  Tobacco Use: Low Risk  (01/14/2023)     Readmission Risk Interventions    12/08/2022   11:52 AM  Readmission Risk Prevention Plan  Transportation Screening Complete  PCP or Specialist Appt within 3-5 Days Not Complete  Not Complete comments home w/ hospice  HRI or Home Care Consult Complete  Social Work Consult for Recovery Care Planning/Counseling Complete  Palliative Care Screening Complete  Medication Review Oceanographer) Complete    Oletta Lamas, MSW, LCSWA, LCASA Transitions of Care  Clinical Social Worker I

## 2023-02-24 NOTE — Discharge Summary (Signed)
Name: Jillian [name] MRN: [id number] DOB: 05/01/1936 87 y.o. PCP: Storm Frisk, MD  Date of Admission: 02/20/2023 11:36 AM Date of Discharge: 02-24-2023 Attending Physician: Reymundo Poll, MD  Discharge Diagnosis: Principal Problem:   Acute on chronic heart failure with preserved ejection fraction (HFpEF) Active Problems:   CKD (chronic kidney disease) stage 4, GFR 15-29 ml/min   Paroxysmal atrial fibrillation with RVR     Discharge Medications: Allergies as of 02/24/2023       Reactions   Other Other (See Comments)   "Tapes tears off my skin"   Latex Other (See Comments)   "Blisters my skin and tears it off"- skin burns, too   Penicillins Nausea Only   Tape Other (See Comments)   "Tapes tears off my skin"   Aspirin Other (See Comments)   Irritates the stomach   Atorvastatin Other (See Comments)   Caused fatigue   Diltiazem Hcl Nausea And Vomiting        Medication List     STOP taking these medications    amLODipine 5 MG tablet Commonly known as: NORVASC   haloperidol 5 MG tablet Commonly known as: HALDOL   hydrALAZINE 50 MG tablet Commonly known as: APRESOLINE   LORazepam 0.5 MG tablet Commonly known as: ATIVAN   molnupiravir EUA 200 MG Caps capsule Commonly known as: LAGEVRIO       TAKE these medications    acetaminophen 325 MG tablet Commonly known as: TYLENOL Take 650 mg by mouth every 6 (six) hours as needed for moderate pain.   ADULT NUTRITIONAL SUPPLEMENT PO Take 30 mLs by mouth 2 (two) times daily. Liquid Protein   amiodarone 200 MG tablet Commonly known as: PACERONE Take 1 tablet (200 mg total) by mouth daily.   apixaban 2.5 MG Tabs tablet Commonly known as: ELIQUIS Take 1 tablet (2.5 mg total) by mouth 2 (two) times daily.   cholecalciferol 25 MCG (1000 UT) tablet Take 1,000 Units by mouth daily.   DAKINS EX Apply 1 Application topically daily. To coccyx. Apply medihoney paste after cleaning with dakins.    diphenhydrAMINE 25 mg capsule Commonly known as: BENADRYL Take 25 mg by mouth every 6 (six) hours as needed for itching or allergies.   furosemide 40 MG tablet Commonly known as: LASIX Take 1 tablet (40 mg total) by mouth daily.   levalbuterol 1.25 MG/0.5ML nebulizer solution Commonly known as: XOPENEX Take 1.25 mg by nebulization every 6 (six) hours as needed for wheezing or shortness of breath.   metoprolol tartrate 100 MG tablet Commonly known as: LOPRESSOR Take 1 tablet (100 mg total) by mouth 2 (two) times daily. What changed:  medication strength how much to take   mouth rinse Liqd solution 15 mLs by Mouth Rinse route 4 (four) times daily.   polyethylene glycol 17 g packet Commonly known as: MIRALAX / GLYCOLAX Take 17 g by mouth daily.   potassium chloride SA 20 MEQ tablet Commonly known as: KLOR-CON M Take 1 tablet (20 mEq total) by mouth daily.   senna-docusate 8.6-50 MG tablet Commonly known as: Senokot-S Take 1 tablet by mouth at bedtime as needed for mild constipation. What changed:  how much to take when to take this reasons to take this               Discharge Care Instructions  (From admission, onward)           Start     Ordered   02/24/23 0000  Discharge wound  care:       Comments: Administer Dakins and Medihoney to coccyx daily   02/24/23 1005             Disposition and follow-up:    Jillian [name] [facility] in Stable condition.  At the hospital follow up visit please address:   1.  Atrial fibrillation: ensure heart rate remains adequately controlled with amiodarone and metoprolol, confirm adherence to these medications and the appropriate doses  2.  Heart failure: assess volume status, confirm adherence to furosemide and potassium supplementation, consider BMP  3.  Respiratory failure: ensure breathing is stable on supplemental oxygen  4. Labs / imaging needed at time of  follow-up: consider BMP  5.  Pending labs / tests needing follow-up: none    Follow-up Appointments:  None   Hospital Course by problem list:  Jillian [name] is an 87 year old female with a past medical history of hypertension, CKD IV, HFpEF, AFib, chronic respiratory failure, and dementia who presented with breath shortness, admitted for treatment of acute heart failure exacerbation and atrial fibrillation with rapid ventricular response.   ---Acute on chronic heart failure preserved ejection fraction ---Chronic respiratory failure Patient has history of heart failure preserved ejection fraction, echocardiogram performed 11-2022 demonstrated EF 55 without regional wall motion abnormality. She presented with breath shortness, leg swelling, and orthopnea that started about two weeks ago. Upon arrival, laboratory testing revealed BNP 2009 and Trop 46-56-58, ECG negative for ischemic changes. Chest radiograph without any consolidation or effusion. Exam demonstrated BLE edema and bibasilar crackles. Oxygen requirement remains home nasal cannula at 2L/min. Etiology of heart failure exacerbation most likely recent COVID infection given symptom timeline. Atrial fibrillation possibly contributing as well. Intravenous furosemide brought weight down from 73.4kg to 70.7kg, after which patient appeared euvolemic with only trace pitting edema. Home furosemide dose of 40mg  oral was resumed on 4-9. Oxygen requirement throughout hospitalization fluctuated between 2-3L/min. Discharged with home furosemide dose and potassium supplements to prevent hypokalemia.     ---Atrial fibrillation with rapid ventricular response Patient has history of atrial fibrillation managed at home with amiodarone and metoprolol. Upon arrival, ECG demonstrated atrial fibrillation with HR 119. Etiology heart failure exacerbation and COVID. Home medications and diltiazem drip were started, HR improved to around 100. Metoprolol dose  increased from 50mg  to 100mg  and diltiazem discontinued in favor of oral medications per cardiology recommendations. Overall, HR has improved but continues to fluctuate into 110s despite maximizing medical therapy. Daughter decided on returning [facility] for acute rehabilitation with eventual plan of resuming hospice. Discharged with amiodarone and metoprolol at new dose of 100mg  twice daily. Apixaban was continued throughout hospitalization and upon discharge.     ---Mild dysphagia  Patient demonstrated some difficulty swallowing pills and food residue was present in her mouth long after meal consumption. Evaluation by speech-language has revealed mild dysphagia and moderate aspiration risk, recommending soft diet.     ---Coronavirus disease Patient reports breath shortness for about two weeks, diagnosed with COVID on 4-4 and was given molnupiravir Chest radiograph negative for signs of pneumonia and WBC count remained within normal limits throughout hospitalization.      ---Chronic kidney disease IV Patient has history of chronic kidney disease, baseline creatinine around 2-3 and GFR 10-20. Upon arrival, creatinine 2.41 and GFR 21 both at approximate baseline. Creatinine has fluctuated within a narrow range but remained stable since admission, GFR slightly down-trended.      ---Hypertension Patient has history of hypertension  managed at home with amlodipine and metoprolol. Blood pressure has been stable in the slightly elevated range throughout hospitalization. Amlodipine discontinued on 4-10 to accommodate increased metoprolol dose. Discharged with metoprolol.    Discharge Exam:    BP 102/68 (BP Location: Left Arm)   Pulse 96   Temp 98 F (36.7 C) (Oral)   Resp 20   Ht 5\' 1"  (1.549 m)   Wt 68.7 kg   SpO2 94%   BMI 28.62 kg/m   Subjective: Jillian [name] was sleepy but interactive this morning. Denies chest or abdominal pain and breath shortness. Oriented to person, setting,  city, year, and month. Discussed plan of returning to [facility]. All questions were answered.  General:                       sleepy but interactive, lying comfortably in bed, not in acute distress Lungs:                          normal respiratory effort on 3L/min supplemental oxygen, breathing unlabored, no wheezing Cardiac:                        tachycardic with irregular rhythm, normal S1 and S2, trace pitting edema in BLE Neurologic:                   oriented to person-city-month, moving all extremities, no gross focal deficits Psychiatric:                   euthymic mood with blunted affect, speech slow but intelligible     Pertinent Labs, Studies, and Procedures:   Labs:    Latest Ref Rng & Units 02/23/2023    1:08 AM 02/22/2023    3:56 AM 02/21/2023    1:00 AM  CBC  WBC 4.0 - 10.5 K/uL 6.8  5.0  4.1   Hemoglobin 12.0 - 15.0 g/dL 9.7  9.5  9.8   Hematocrit 36.0 - 46.0 % 31.0  31.5  32.3   Platelets 150 - 400 K/uL 215  191  173       Latest Ref Rng & Units 02/24/2023    1:00 AM 02/23/2023    1:08 AM 02/22/2023    3:56 AM  BMP  Glucose 70 - 99 mg/dL 80  99  89   BUN 8 - 23 mg/dL 28  25  20    Creatinine 0.44 - 1.00 mg/dL 6.73  4.19  3.79   Sodium 135 - 145 mmol/L 141  139  139   Potassium 3.5 - 5.1 mmol/L 3.5  3.9  3.7   Chloride 98 - 111 mmol/L 102  104  102   CO2 22 - 32 mmol/L 27  28  27    Calcium 8.9 - 10.3 mg/dL 8.3  8.4  8.4    ______________________  Imaging:  DG Chest Portable 1 View Result Date: 02/20/2023 IMPRESSION: Cardiomegaly. There are no signs of alveolar pulmonary edema or new focal pulmonary consolidation.   ______________________  Procedures:  none  ______________________   Discharge Instructions:  Discharge Instructions     Diet - low sodium heart healthy   Complete by: As directed    Discharge instructions   Complete by: As directed    ?Jillian [name],  It was a pleasure taking care of you while you were in the hospital. Your  breath shortness was caused by  several factors including recent COVID infection, heart failure exacerbation, and an irregular heart beat.  During your stay in the hospital, we removed some fluid from your body so that you could breathe better. We also controlled your heart rate with medications that you will continue to take at [facility]. Your daughter was updated throughout this process and helped [place] decide next steps for your care.  We are sending you home with a higher dose of metoprolol to control your heart rate and new potassium supplements to improve your electrolyte levels.  All the best,   Discharge wound care:   Complete by: As directed    Administer Dakins and Medihoney to coccyx daily   Increase activity slowly   Complete by: As directed      Jillian [name],  It was a pleasure taking care of you while you were in the hospital. Your breath shortness was caused by several factors including recent COVID infection, heart failure exacerbation, and an irregular heart beat.  During your stay in the hospital, we removed some fluid from your body so that you could breathe better. We also controlled your heart rate with medications that you will continue to take at [facility]. Your daughter was updated throughout this process and helped [place] decide next steps for your care.  We are sending you home with a higher dose of metoprolol to control your heart rate and new potassium supplements to improve your electrolyte levels.  All the best,  Signed: Lajuana Ripple, MD Internal Medicine PGY-1 Pager [phone]

## 2023-02-25 DIAGNOSIS — I509 Heart failure, unspecified: Secondary | ICD-10-CM | POA: Diagnosis not present

## 2023-02-25 DIAGNOSIS — I4891 Unspecified atrial fibrillation: Secondary | ICD-10-CM | POA: Diagnosis not present

## 2023-02-25 DIAGNOSIS — M79601 Pain in right arm: Secondary | ICD-10-CM | POA: Diagnosis not present

## 2023-02-25 DIAGNOSIS — E559 Vitamin D deficiency, unspecified: Secondary | ICD-10-CM | POA: Diagnosis not present

## 2023-02-25 DIAGNOSIS — Z7409 Other reduced mobility: Secondary | ICD-10-CM | POA: Diagnosis not present

## 2023-02-25 DIAGNOSIS — I10 Essential (primary) hypertension: Secondary | ICD-10-CM | POA: Diagnosis not present

## 2023-03-01 DIAGNOSIS — R410 Disorientation, unspecified: Secondary | ICD-10-CM | POA: Diagnosis not present

## 2023-03-01 DIAGNOSIS — F03911 Unspecified dementia, unspecified severity, with agitation: Secondary | ICD-10-CM | POA: Diagnosis not present

## 2023-03-02 DIAGNOSIS — I5020 Unspecified systolic (congestive) heart failure: Secondary | ICD-10-CM | POA: Diagnosis not present

## 2023-03-02 DIAGNOSIS — R239 Unspecified skin changes: Secondary | ICD-10-CM | POA: Diagnosis not present

## 2023-03-02 DIAGNOSIS — M6281 Muscle weakness (generalized): Secondary | ICD-10-CM | POA: Diagnosis not present

## 2023-03-02 DIAGNOSIS — N184 Chronic kidney disease, stage 4 (severe): Secondary | ICD-10-CM | POA: Diagnosis not present

## 2023-03-02 DIAGNOSIS — L89153 Pressure ulcer of sacral region, stage 3: Secondary | ICD-10-CM | POA: Diagnosis not present

## 2023-03-03 DIAGNOSIS — I10 Essential (primary) hypertension: Secondary | ICD-10-CM | POA: Diagnosis not present

## 2023-03-03 DIAGNOSIS — N184 Chronic kidney disease, stage 4 (severe): Secondary | ICD-10-CM | POA: Diagnosis not present

## 2023-03-03 DIAGNOSIS — I4891 Unspecified atrial fibrillation: Secondary | ICD-10-CM | POA: Diagnosis not present

## 2023-03-03 DIAGNOSIS — I509 Heart failure, unspecified: Secondary | ICD-10-CM | POA: Diagnosis not present

## 2023-03-07 DIAGNOSIS — I509 Heart failure, unspecified: Secondary | ICD-10-CM | POA: Diagnosis not present

## 2023-03-07 DIAGNOSIS — Z7409 Other reduced mobility: Secondary | ICD-10-CM | POA: Diagnosis not present

## 2023-03-07 DIAGNOSIS — I10 Essential (primary) hypertension: Secondary | ICD-10-CM | POA: Diagnosis not present

## 2023-03-07 DIAGNOSIS — N184 Chronic kidney disease, stage 4 (severe): Secondary | ICD-10-CM | POA: Diagnosis not present

## 2023-03-08 DIAGNOSIS — I4891 Unspecified atrial fibrillation: Secondary | ICD-10-CM | POA: Diagnosis not present

## 2023-03-08 DIAGNOSIS — S31000A Unspecified open wound of lower back and pelvis without penetration into retroperitoneum, initial encounter: Secondary | ICD-10-CM | POA: Diagnosis not present

## 2023-03-08 DIAGNOSIS — E559 Vitamin D deficiency, unspecified: Secondary | ICD-10-CM | POA: Diagnosis not present

## 2023-03-08 DIAGNOSIS — F03911 Unspecified dementia, unspecified severity, with agitation: Secondary | ICD-10-CM | POA: Diagnosis not present

## 2023-03-08 DIAGNOSIS — I509 Heart failure, unspecified: Secondary | ICD-10-CM | POA: Diagnosis not present

## 2023-03-08 DIAGNOSIS — I10 Essential (primary) hypertension: Secondary | ICD-10-CM | POA: Diagnosis not present

## 2023-03-09 DIAGNOSIS — R239 Unspecified skin changes: Secondary | ICD-10-CM | POA: Diagnosis not present

## 2023-03-09 DIAGNOSIS — M6281 Muscle weakness (generalized): Secondary | ICD-10-CM | POA: Diagnosis not present

## 2023-03-09 DIAGNOSIS — N184 Chronic kidney disease, stage 4 (severe): Secondary | ICD-10-CM | POA: Diagnosis not present

## 2023-03-09 DIAGNOSIS — L89153 Pressure ulcer of sacral region, stage 3: Secondary | ICD-10-CM | POA: Diagnosis not present

## 2023-03-09 DIAGNOSIS — I5020 Unspecified systolic (congestive) heart failure: Secondary | ICD-10-CM | POA: Diagnosis not present

## 2023-03-10 DIAGNOSIS — I4891 Unspecified atrial fibrillation: Secondary | ICD-10-CM | POA: Diagnosis not present

## 2023-03-10 DIAGNOSIS — I10 Essential (primary) hypertension: Secondary | ICD-10-CM | POA: Diagnosis not present

## 2023-03-20 IMAGING — DX DG CHEST 2V
3 series · 3 of 3 positions shown · non-contrast
Comparison: 09/09/2020

CLINICAL DATA: Shortness of breath history of CHF

EXAM:
CHEST - 2 VIEW

[w chest pa]
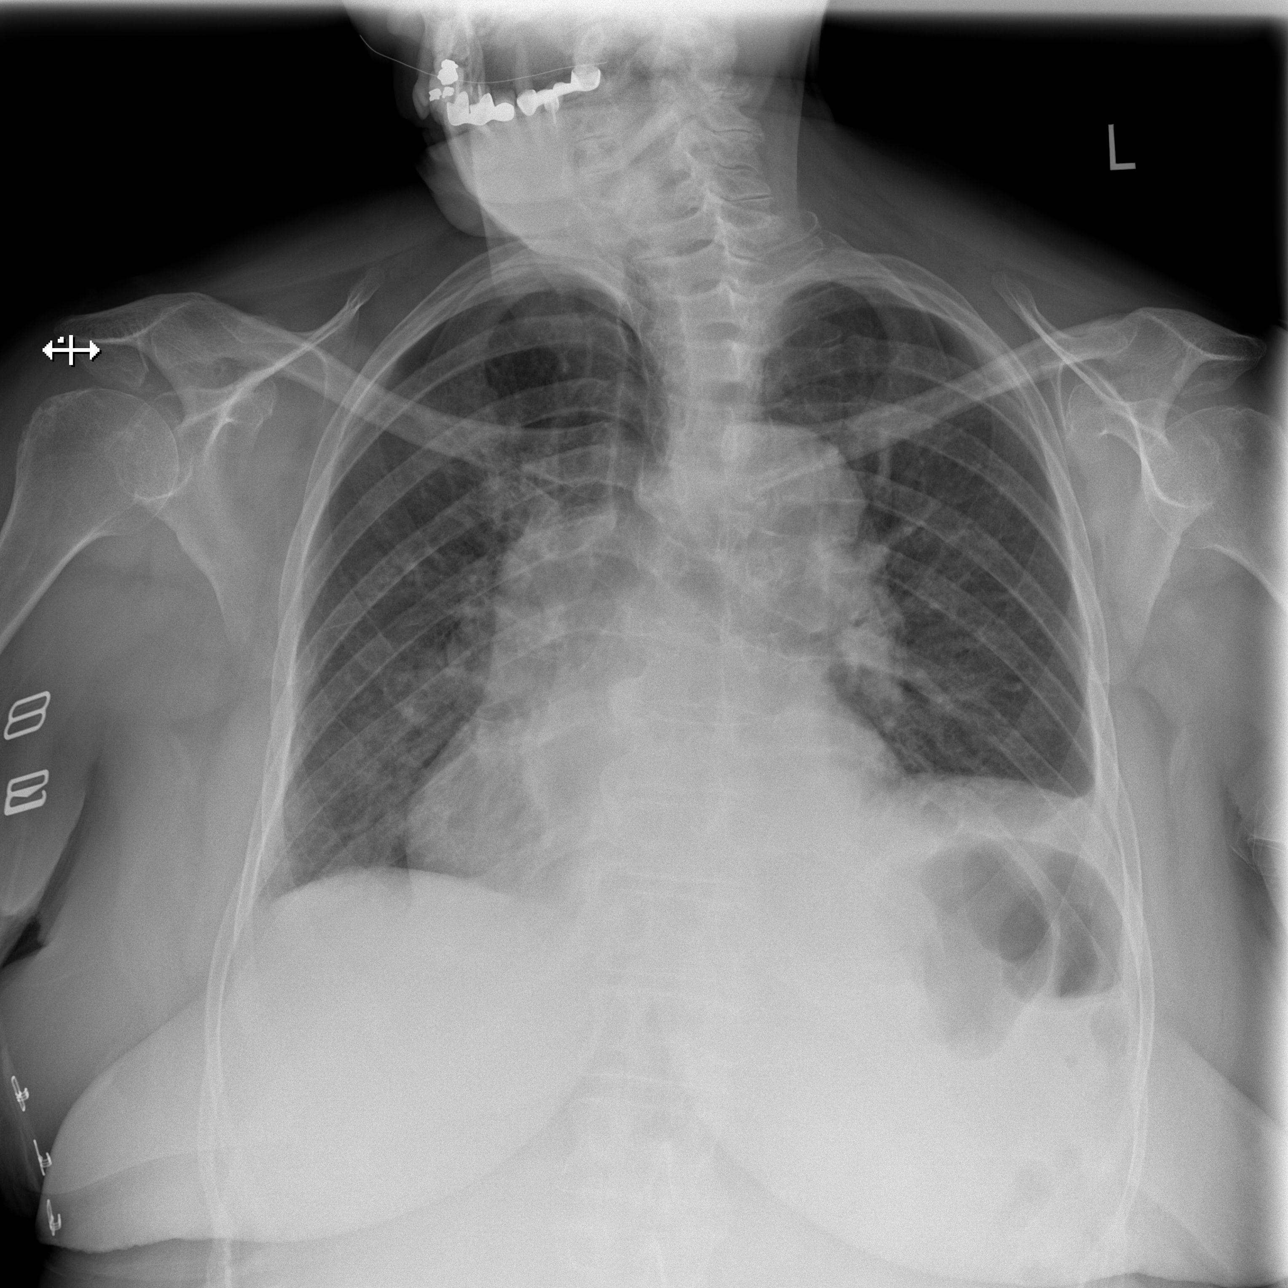

[w chest lat (1 of 2)]
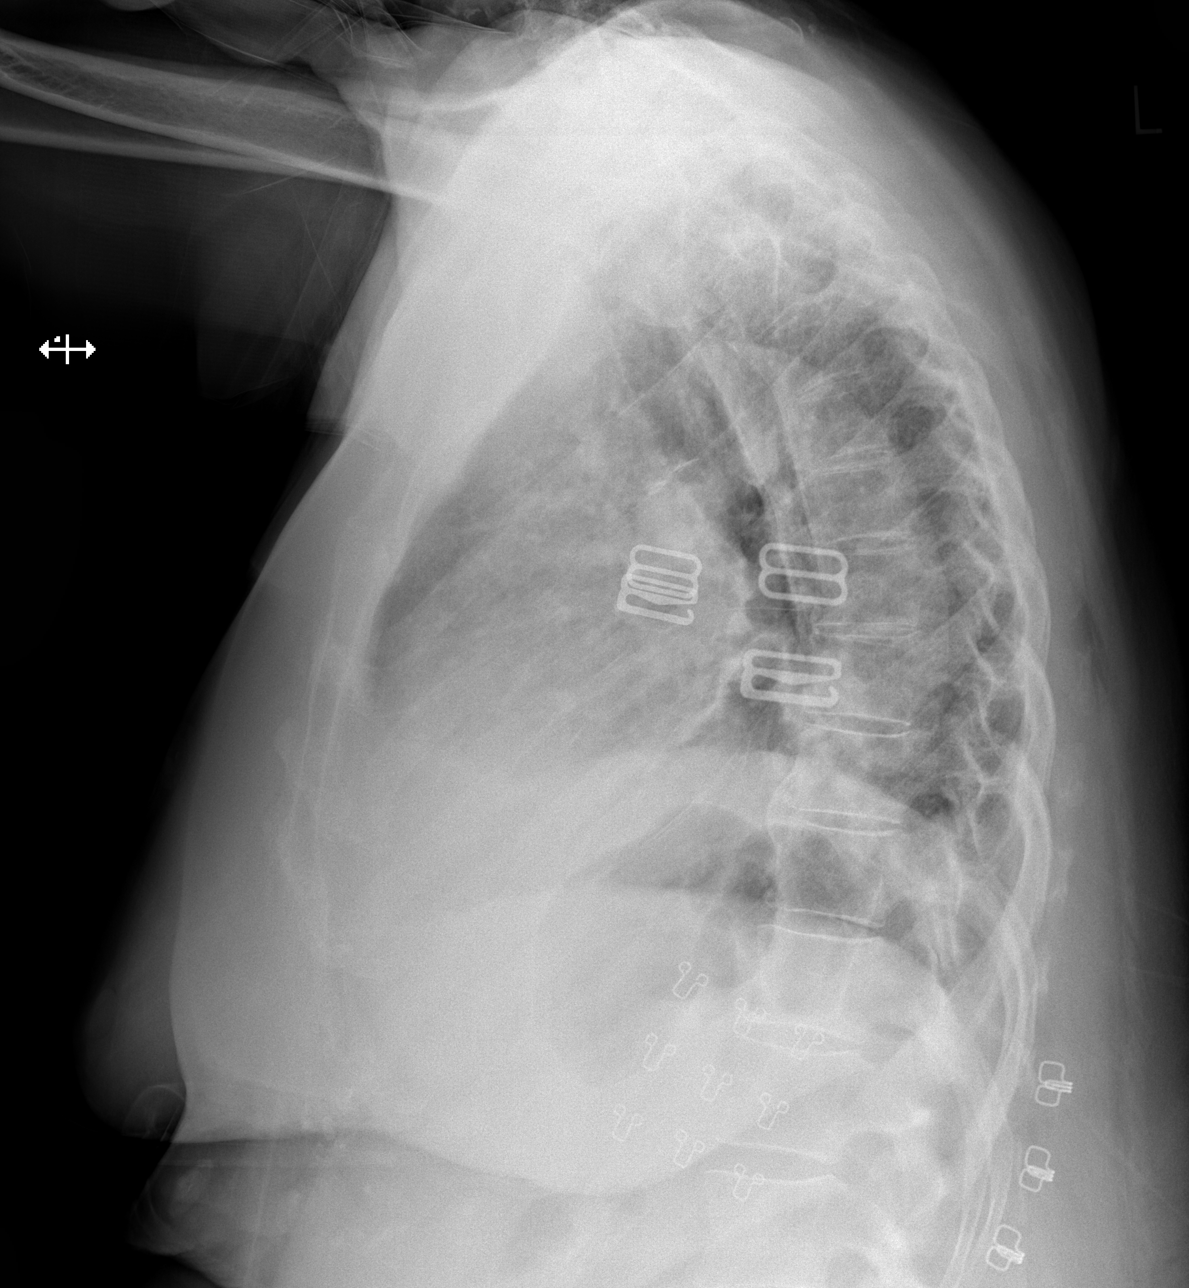

[w chest lat (2 of 2)]
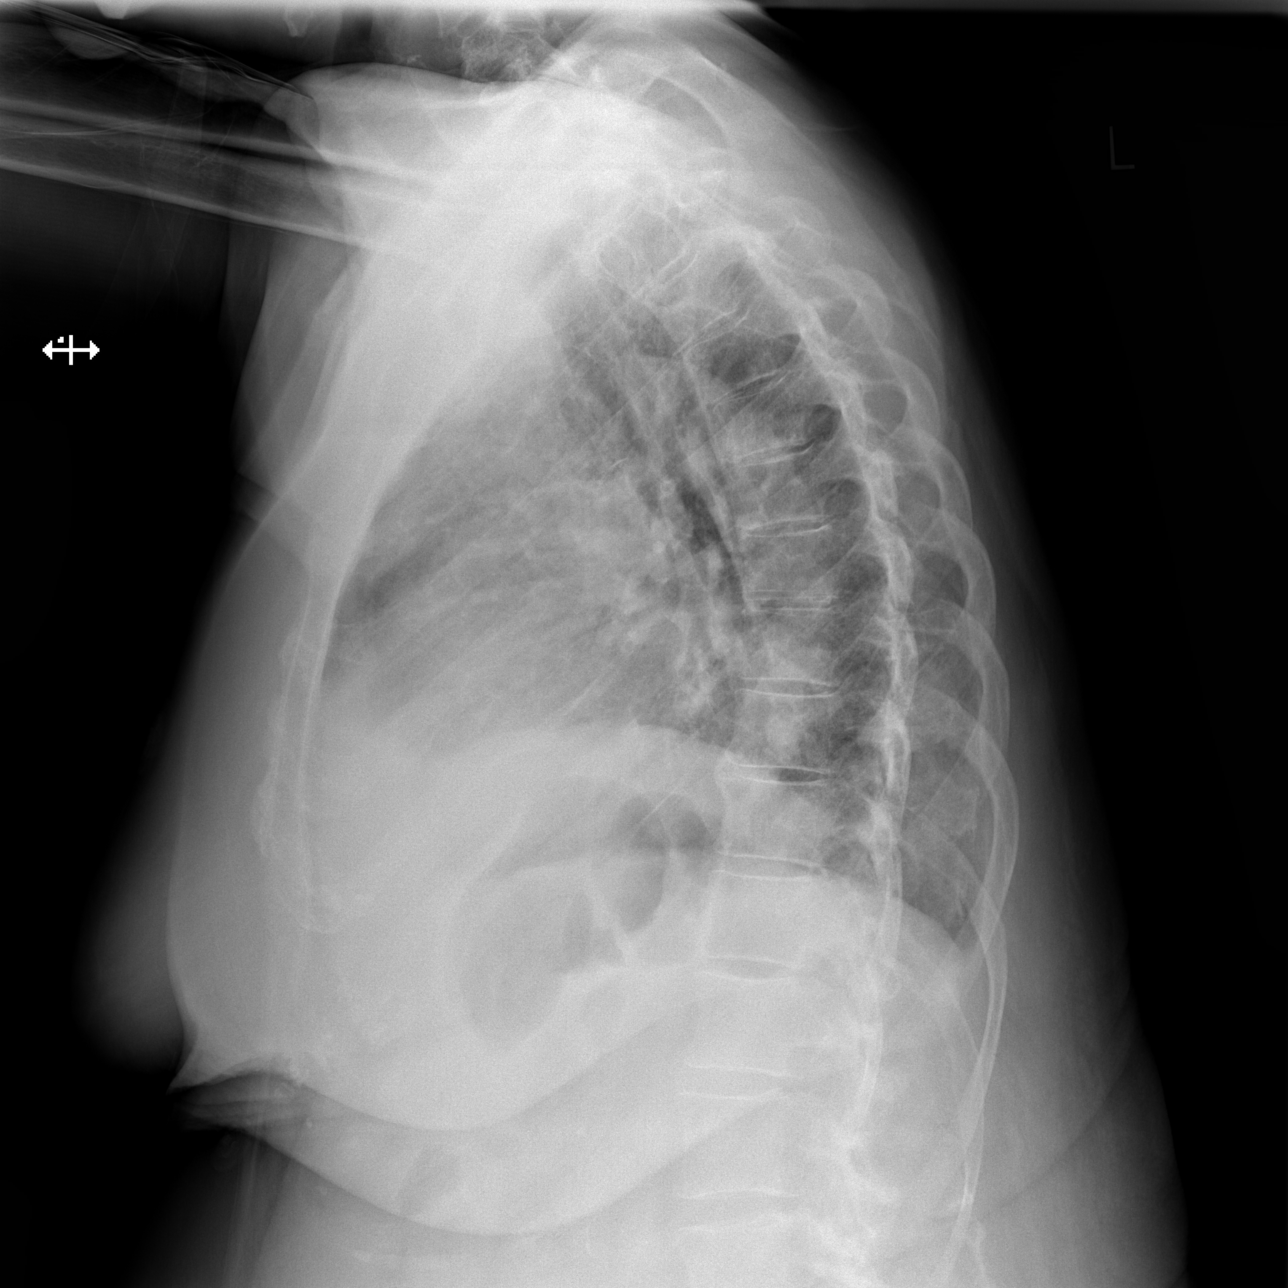

[3 of 3 positions shown; findings below may reference images not displayed]

FINDINGS: Rotated examination. Unchanged cardiomegaly and elevation of the
left hemidiaphragm. Mild pulmonary vascular prominence without overt
edema. Disc degenerative disease of the thoracic spine.
IMPRESSION: Unchanged cardiomegaly and elevation of the left hemidiaphragm. Mild
pulmonary vascular prominence without overt edema.

## 2023-04-16 DEATH — deceased

## 2023-11-02 IMAGING — DX DG CHEST 1V PORT
1 series · 1 of 1 positions shown · non-contrast
Comparison: 07/07/2021

CLINICAL DATA: Shortness of breath

EXAM:
PORTABLE CHEST 1 VIEW

[chest ap]
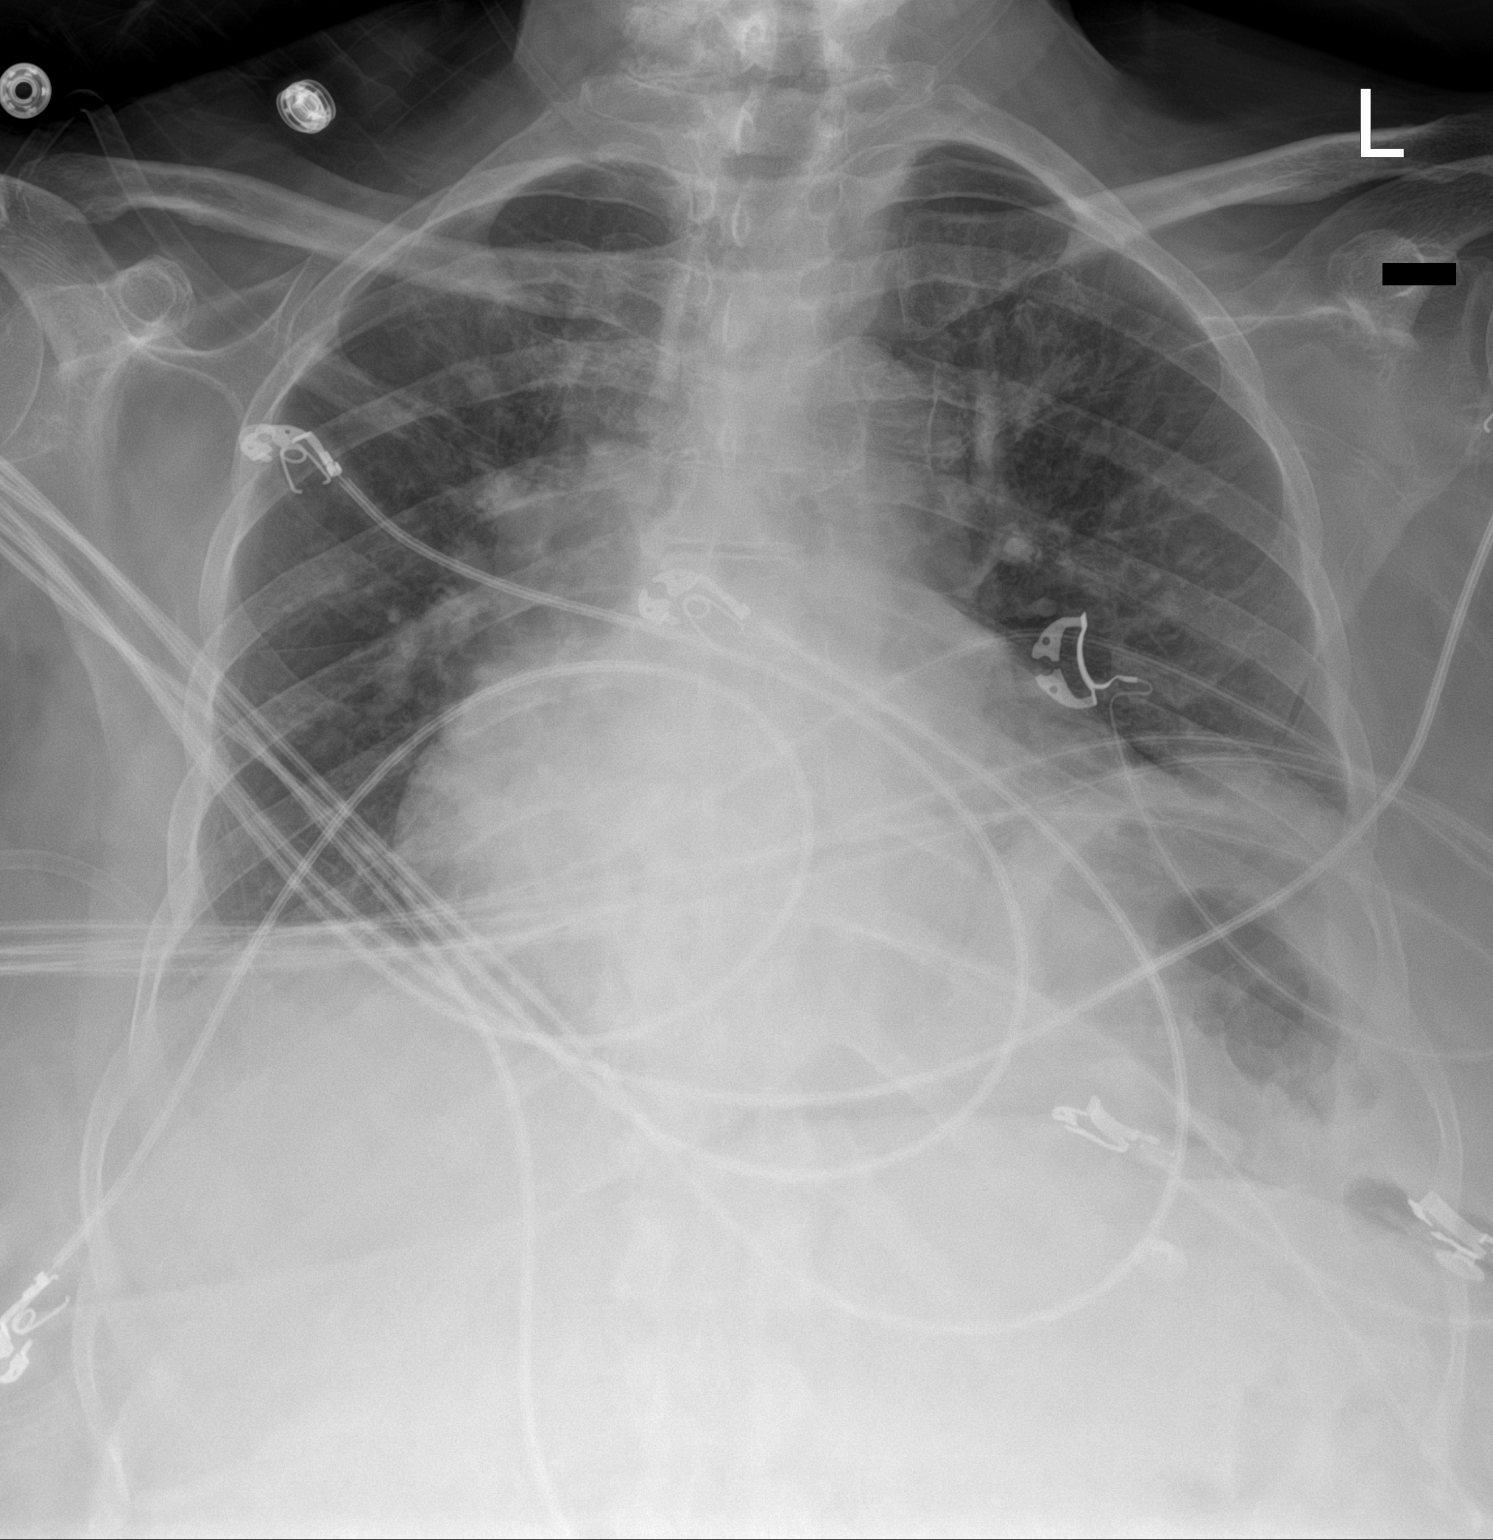

[1 of 1 positions shown; findings below may reference images not displayed]

FINDINGS: Unchanged elevation of the left hemidiaphragm. Moderate
cardiomegaly. No focal airspace consolidation or pulmonary edema.
IMPRESSION: Unchanged elevation of the left hemidiaphragm. No acute airspace
disease.

## 2023-11-04 IMAGING — DX DG CHEST 1V PORT
1 series · 1 of 1 positions shown · non-contrast
Comparison: Prior chest x-ray 02/19/2022

CLINICAL DATA: Shortness of breath

EXAM:
PORTABLE CHEST 1 VIEW

[chest]
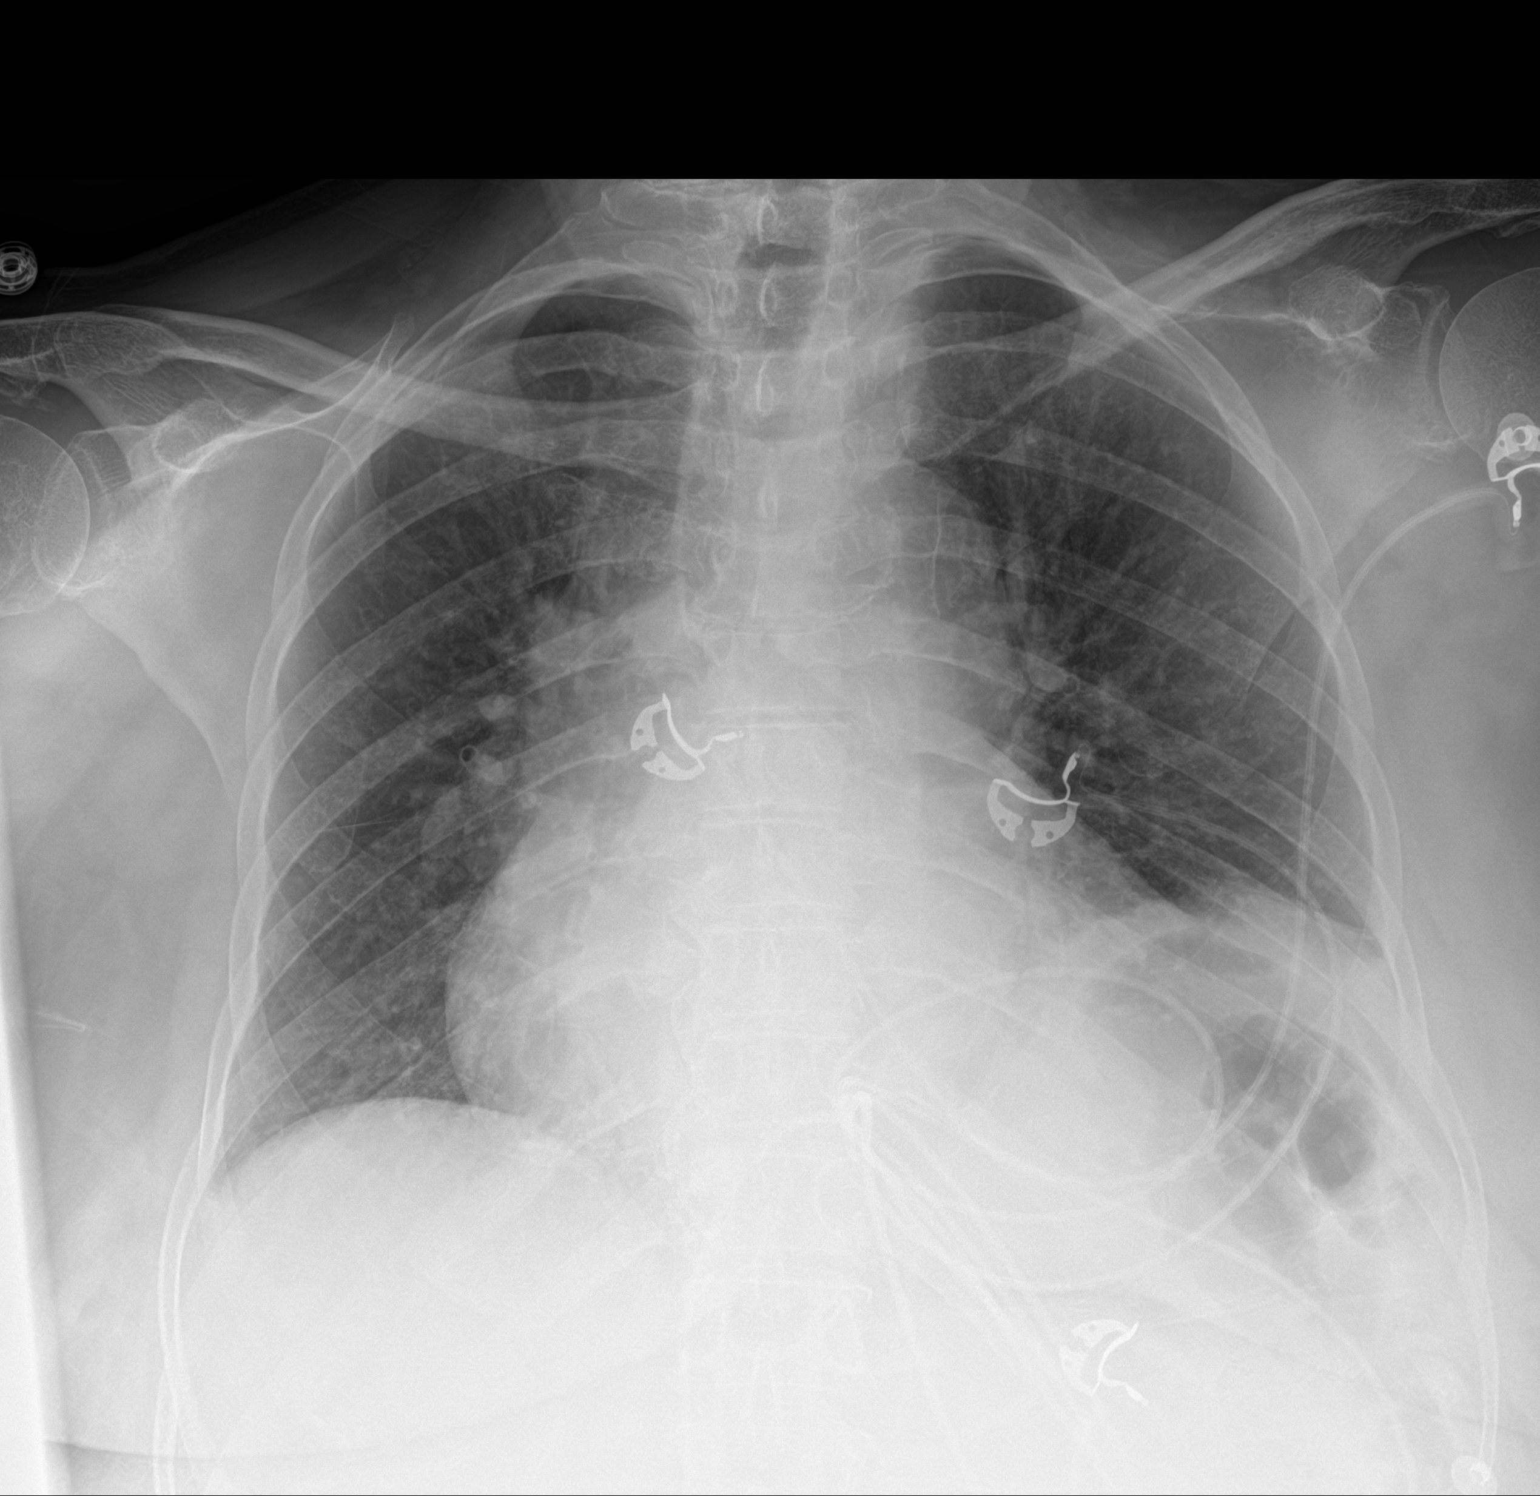

[1 of 1 positions shown; findings below may reference images not displayed]

FINDINGS: Stable cardiomegaly. There is pulmonary vascular congestion with
cephalization but no overt pulmonary edema. No significant pleural
effusion. Elevation of the left hemidiaphragm with associated
streaky opacities favored to reflect atelectasis or scarring.
Otherwise, no focal airspace infiltrate. No pneumothorax. No acute
osseous abnormality.
IMPRESSION: 1. Cardiomegaly with pulmonary vascular congestion but no overt
edema. The degree of vascular congestion appears slightly improved.
2. Stable chronic elevation of the left hemidiaphragm.

## 2023-11-05 IMAGING — DX DG CHEST 1V PORT
1 series · 1 of 1 positions shown · non-contrast
Comparison: Portable chest yesterday at [DATE] a.m.

CLINICAL DATA: Shortness of breath and cough.

EXAM:
PORTABLE CHEST 1 VIEW

[chest]
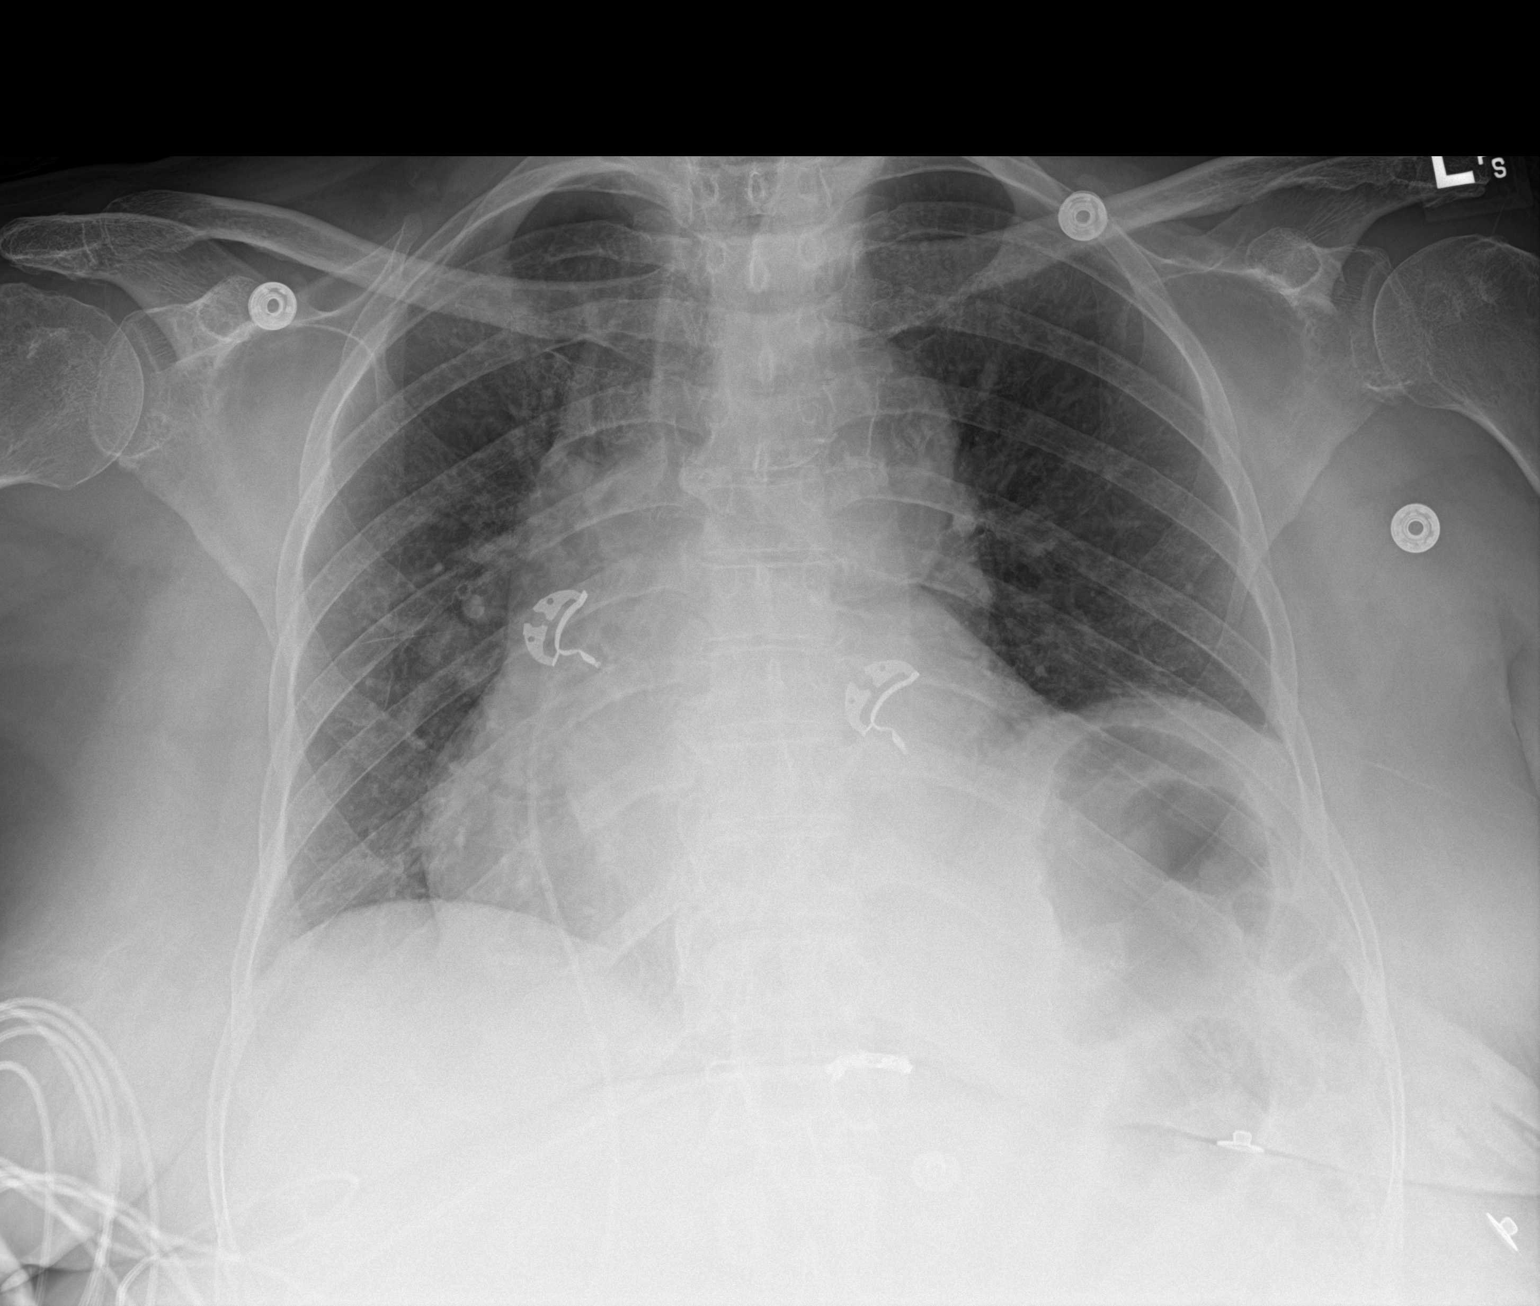

[1 of 1 positions shown; findings below may reference images not displayed]

FINDINGS: [DATE] a.m., 02/22/2022. Stable cardiomegaly, aortic tortuosity,
ectasia and calcification.

There is continued improvement in vascular congestion with mild
central vascular fullness remaining.

Edema is seen. There is a stable appearance of atelectasis in the
left base with elevated left hemidiaphragm.

The remaining lungs are clear. In all other respects no further
changes.
IMPRESSION: Near resolution of perihilar vascular congestion. No edema is seen.
Stable left basilar opacity most likely atelectasis. Stable
cardiomegaly.

## 2023-11-19 IMAGING — DX DG CHEST 2V
2 series · 2 of 2 positions shown · non-contrast
Comparison: 02/22/2022

CLINICAL DATA: 86-year-old female with bronchitis

EXAM:
CHEST - 2 VIEW

[dg chest 2 view (1 of 2)]
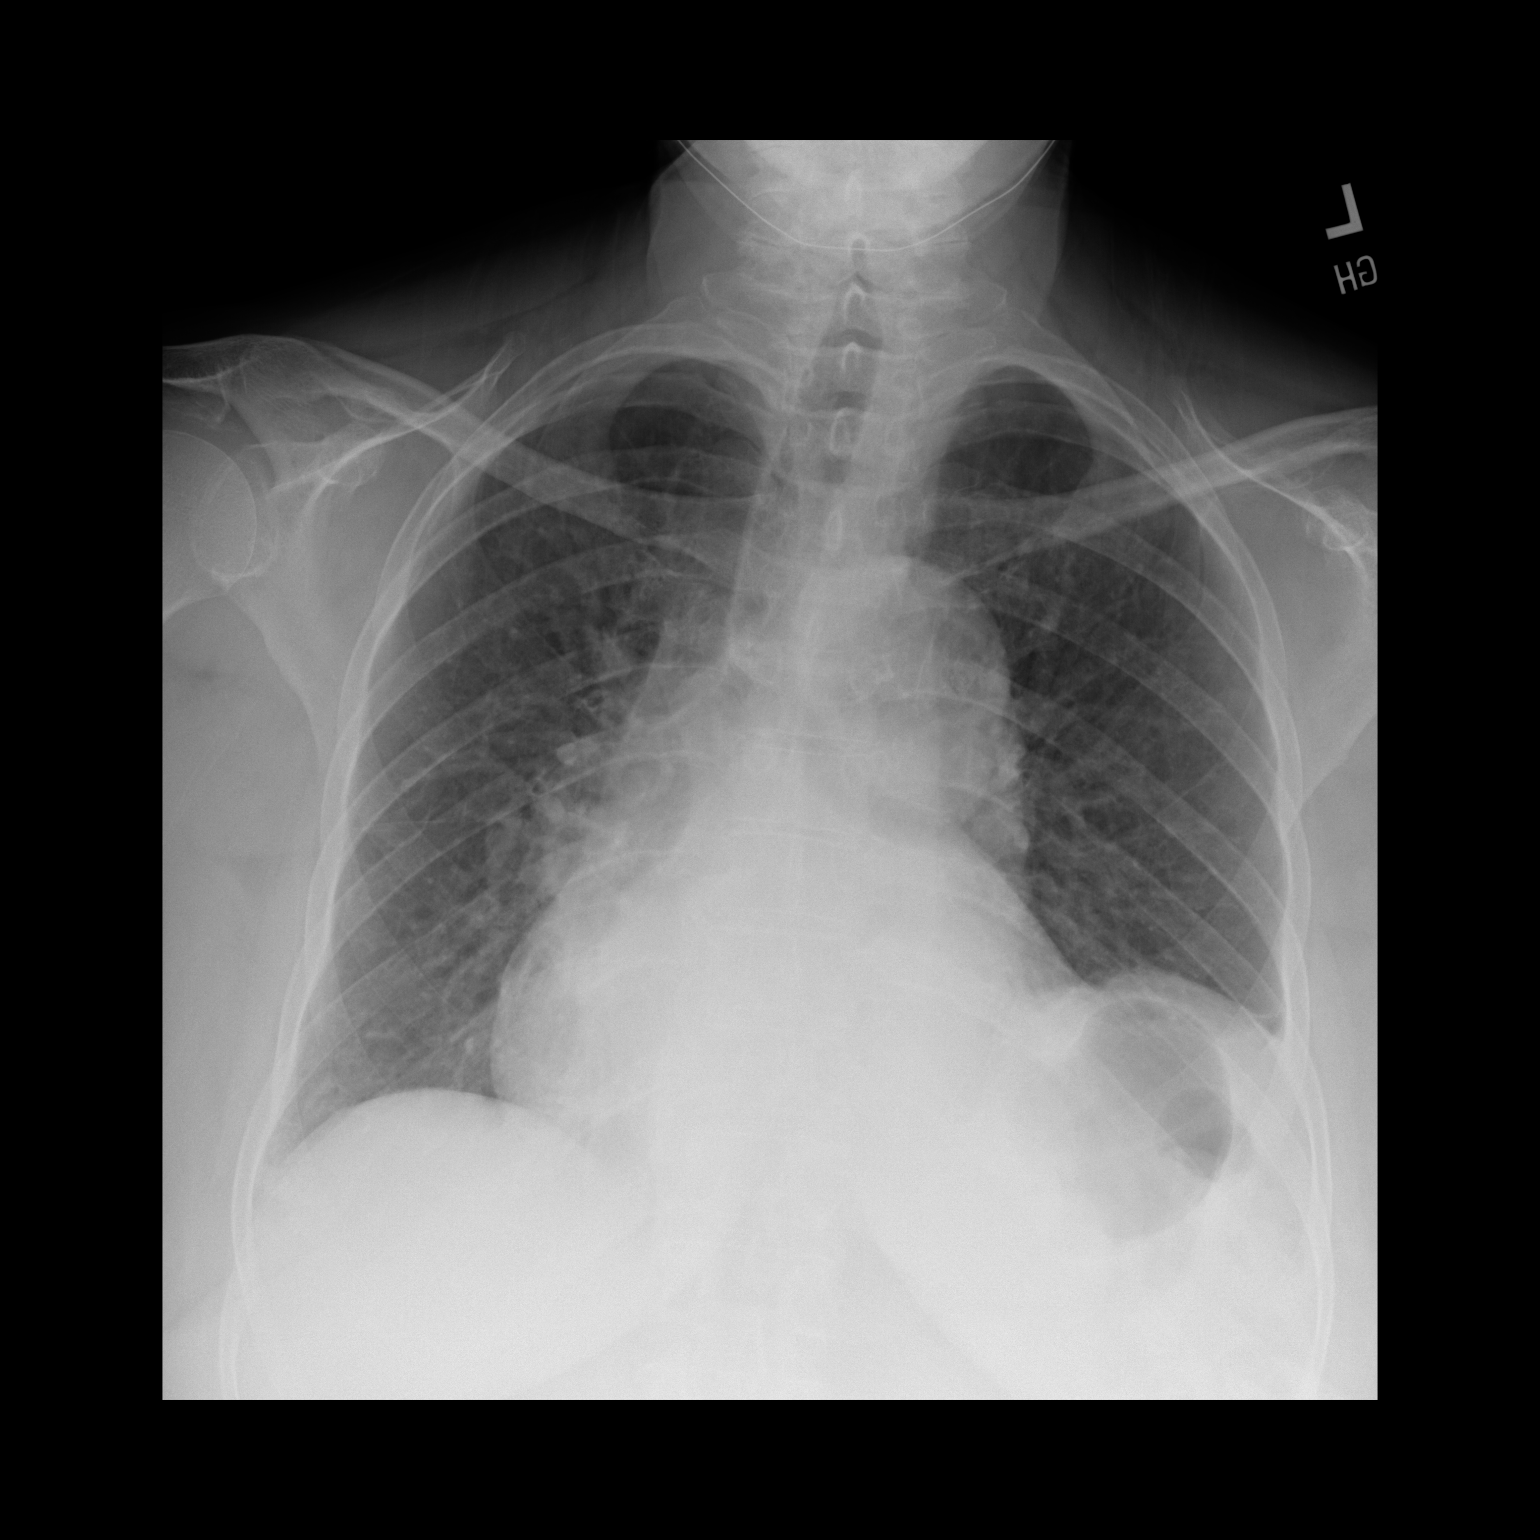

[dg chest 2 view (2 of 2)]
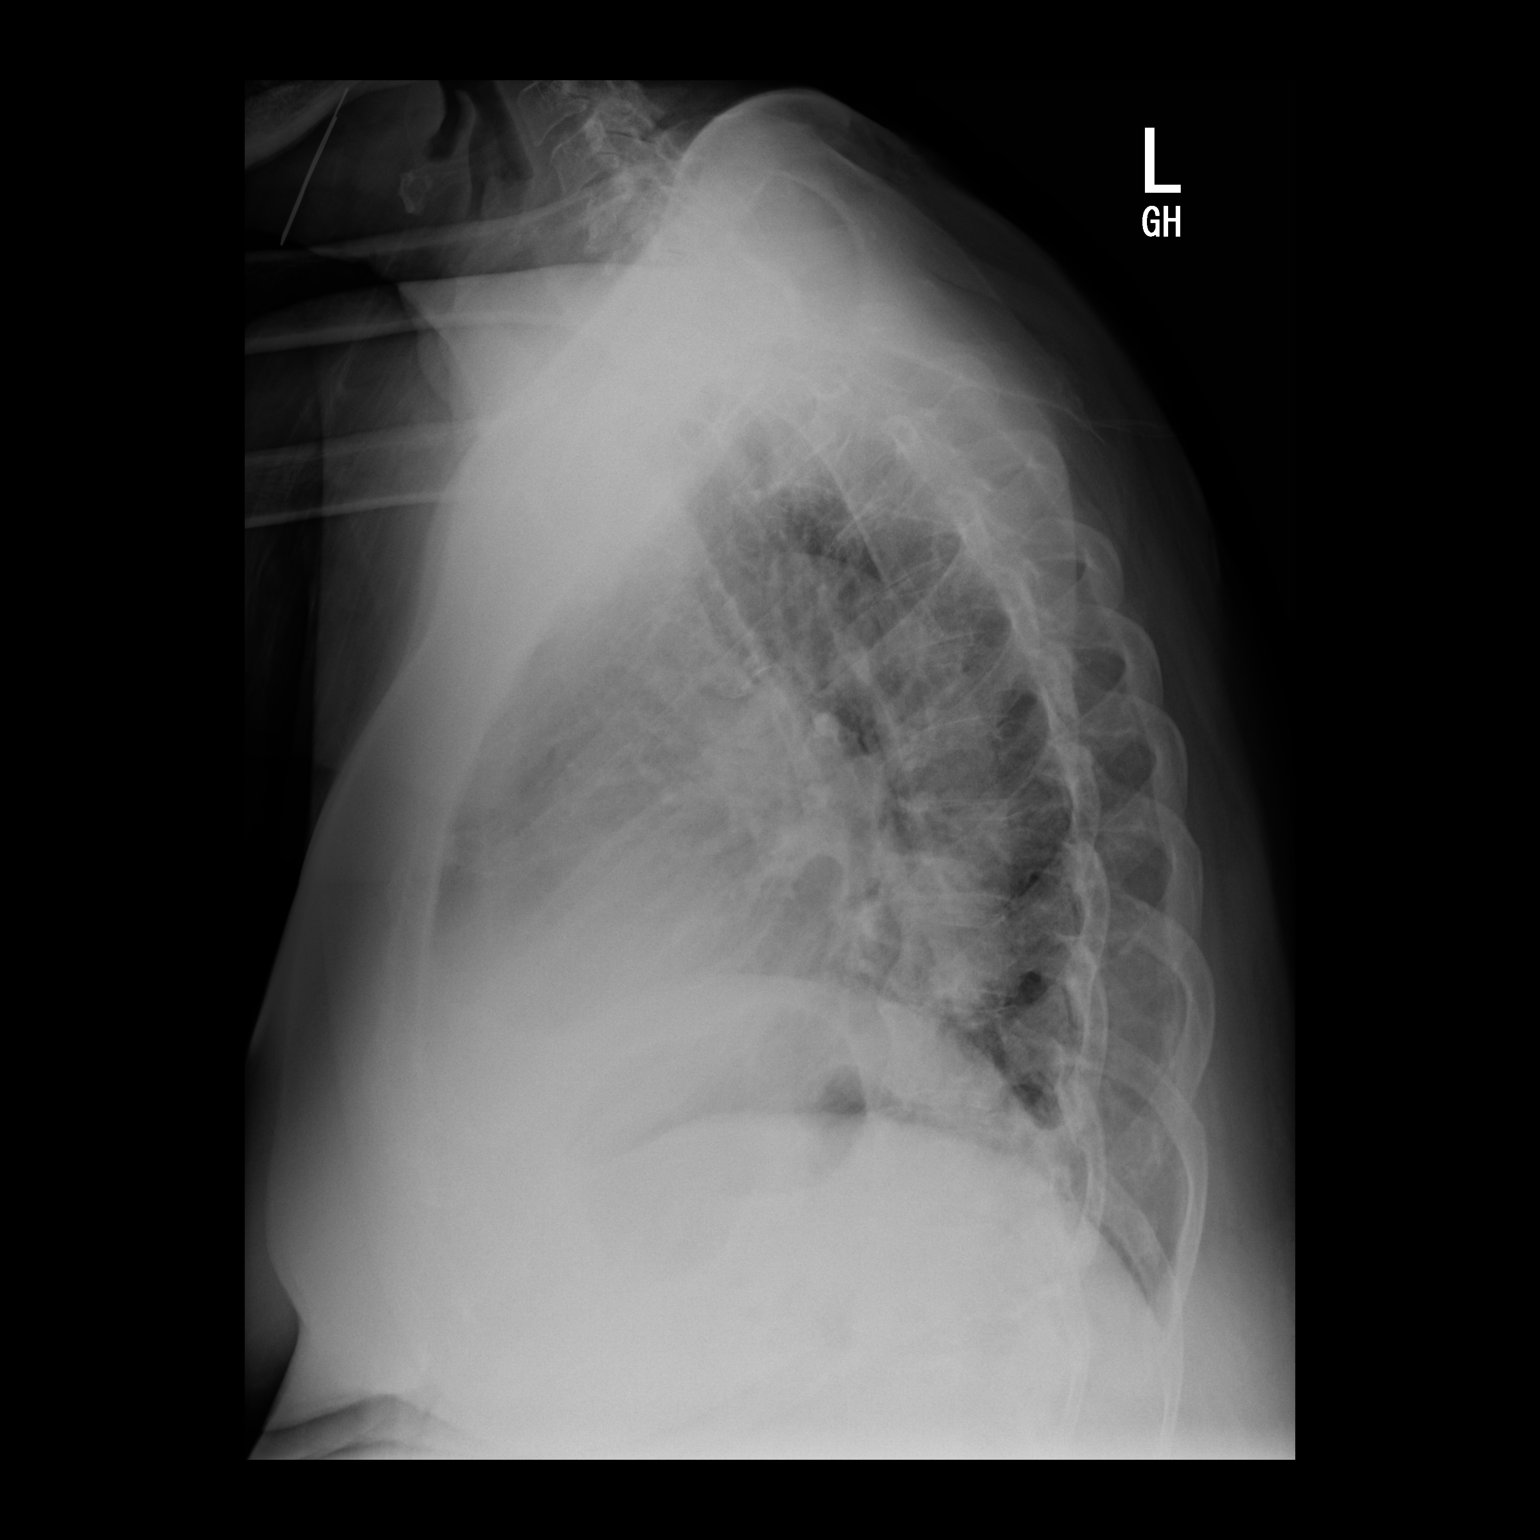

[2 of 2 positions shown; findings below may reference images not displayed]

FINDINGS: Cardiomediastinal silhouette unchanged in size and contour. No
evidence of central vascular congestion. No interlobular septal
thickening.

Asymmetric elevation of the left hemidiaphragm persists.

No pneumothorax or pleural effusion. Coarsened interstitial
markings, with no confluent airspace disease.

No acute displaced fracture. Degenerative changes of the spine.
IMPRESSION: Negative for acute cardiopulmonary disease
